# Patient Record
Sex: Male | Born: 1940 | Race: White | Hispanic: No | Marital: Single | Smoking: Former smoker
Health system: Southern US, Community
[De-identification: ages and names within clinical notes are randomized; demographics above are authoritative.]

## PROBLEM LIST (undated history)

## (undated) ENCOUNTER — Emergency Department (HOSPITAL_COMMUNITY): Admission: EM | Payer: Medicare Other | Source: Home / Self Care

## (undated) DIAGNOSIS — G20A1 Parkinson's disease without dyskinesia, without mention of fluctuations: Secondary | ICD-10-CM

## (undated) DIAGNOSIS — E785 Hyperlipidemia, unspecified: Secondary | ICD-10-CM

## (undated) DIAGNOSIS — F17200 Nicotine dependence, unspecified, uncomplicated: Secondary | ICD-10-CM

## (undated) DIAGNOSIS — G20 Parkinson's disease: Secondary | ICD-10-CM

## (undated) DIAGNOSIS — J449 Chronic obstructive pulmonary disease, unspecified: Secondary | ICD-10-CM

## (undated) DIAGNOSIS — I10 Essential (primary) hypertension: Secondary | ICD-10-CM

## (undated) DIAGNOSIS — I2510 Atherosclerotic heart disease of native coronary artery without angina pectoris: Secondary | ICD-10-CM

## (undated) HISTORY — PX: NO PAST SURGERIES: SHX2092

## (undated) HISTORY — DX: Hyperlipidemia, unspecified: E78.5

## (undated) HISTORY — PX: OTHER SURGICAL HISTORY: SHX169

## (undated) HISTORY — DX: Atherosclerotic heart disease of native coronary artery without angina pectoris: I25.10

## (undated) HISTORY — DX: Nicotine dependence, unspecified, uncomplicated: F17.200

---

## 2000-06-04 ENCOUNTER — Encounter: Payer: Self-pay | Admitting: Cardiology

## 2000-06-04 ENCOUNTER — Inpatient Hospital Stay (HOSPITAL_COMMUNITY): Admission: EM | Admit: 2000-06-04 | Discharge: 2000-06-09 | Payer: Self-pay | Admitting: Emergency Medicine

## 2007-09-02 ENCOUNTER — Ambulatory Visit: Payer: Self-pay | Admitting: Family Medicine

## 2007-09-07 ENCOUNTER — Encounter: Admission: RE | Admit: 2007-09-07 | Discharge: 2007-09-07 | Payer: Self-pay | Admitting: Family Medicine

## 2007-09-08 ENCOUNTER — Ambulatory Visit: Payer: Self-pay | Admitting: Family Medicine

## 2007-09-16 LAB — HM COLONOSCOPY

## 2007-09-22 ENCOUNTER — Encounter: Admission: RE | Admit: 2007-09-22 | Discharge: 2007-09-22 | Payer: Self-pay | Admitting: Otolaryngology

## 2007-09-27 ENCOUNTER — Encounter: Admission: RE | Admit: 2007-09-27 | Discharge: 2007-11-23 | Payer: Self-pay | Admitting: Family Medicine

## 2007-09-28 ENCOUNTER — Encounter (INDEPENDENT_AMBULATORY_CARE_PROVIDER_SITE_OTHER): Payer: Self-pay | Admitting: Otolaryngology

## 2007-09-28 ENCOUNTER — Ambulatory Visit (HOSPITAL_BASED_OUTPATIENT_CLINIC_OR_DEPARTMENT_OTHER): Admission: RE | Admit: 2007-09-28 | Discharge: 2007-09-29 | Payer: Self-pay | Admitting: Otolaryngology

## 2008-04-10 ENCOUNTER — Ambulatory Visit: Payer: Self-pay | Admitting: Family Medicine

## 2008-04-14 ENCOUNTER — Ambulatory Visit: Payer: Self-pay | Admitting: Family Medicine

## 2008-04-24 ENCOUNTER — Encounter: Admission: RE | Admit: 2008-04-24 | Discharge: 2008-04-24 | Payer: Self-pay | Admitting: Family Medicine

## 2008-07-11 ENCOUNTER — Encounter: Payer: Self-pay | Admitting: Internal Medicine

## 2008-07-11 ENCOUNTER — Ambulatory Visit: Payer: Self-pay | Admitting: Family Medicine

## 2008-08-14 ENCOUNTER — Ambulatory Visit: Payer: Self-pay | Admitting: Internal Medicine

## 2008-08-14 DIAGNOSIS — R001 Bradycardia, unspecified: Secondary | ICD-10-CM

## 2008-08-14 DIAGNOSIS — E1169 Type 2 diabetes mellitus with other specified complication: Secondary | ICD-10-CM

## 2008-08-14 DIAGNOSIS — I2510 Atherosclerotic heart disease of native coronary artery without angina pectoris: Secondary | ICD-10-CM

## 2008-08-14 DIAGNOSIS — F17200 Nicotine dependence, unspecified, uncomplicated: Secondary | ICD-10-CM | POA: Insufficient documentation

## 2008-08-14 DIAGNOSIS — E785 Hyperlipidemia, unspecified: Secondary | ICD-10-CM

## 2008-09-18 ENCOUNTER — Ambulatory Visit: Payer: Self-pay | Admitting: Internal Medicine

## 2008-10-11 ENCOUNTER — Ambulatory Visit: Payer: Self-pay | Admitting: Family Medicine

## 2008-10-19 ENCOUNTER — Encounter: Payer: Self-pay | Admitting: Internal Medicine

## 2008-10-25 ENCOUNTER — Telehealth (INDEPENDENT_AMBULATORY_CARE_PROVIDER_SITE_OTHER): Payer: Self-pay

## 2008-10-26 ENCOUNTER — Encounter: Payer: Self-pay | Admitting: Internal Medicine

## 2008-10-26 ENCOUNTER — Ambulatory Visit: Payer: Self-pay

## 2008-12-13 ENCOUNTER — Ambulatory Visit: Payer: Self-pay | Admitting: Family Medicine

## 2009-04-18 ENCOUNTER — Ambulatory Visit: Payer: Self-pay | Admitting: Family Medicine

## 2009-11-30 ENCOUNTER — Ambulatory Visit: Payer: Self-pay | Admitting: Internal Medicine

## 2009-11-30 LAB — CONVERTED CEMR LAB
AST: 14 units/L (ref 0–37)
Cholesterol: 160 mg/dL (ref 0–200)
HDL: 34.2 mg/dL — ABNORMAL LOW (ref >39.00)
LDL Cholesterol: 97 mg/dL (ref 0–99)
Total CHOL/HDL Ratio: 5
Triglycerides: 145 mg/dL (ref 0.0–149.0)
VLDL: 29 mg/dL (ref 0.0–40.0)

## 2009-12-02 ENCOUNTER — Ambulatory Visit: Payer: Self-pay | Admitting: Internal Medicine

## 2009-12-02 ENCOUNTER — Inpatient Hospital Stay (HOSPITAL_COMMUNITY): Admission: EM | Admit: 2009-12-02 | Discharge: 2009-12-04 | Payer: Self-pay | Admitting: Emergency Medicine

## 2009-12-27 ENCOUNTER — Ambulatory Visit: Payer: Self-pay | Admitting: Internal Medicine

## 2010-02-18 ENCOUNTER — Ambulatory Visit: Payer: Self-pay | Admitting: Internal Medicine

## 2010-02-21 LAB — CONVERTED CEMR LAB
AST: 17 units/L (ref 0–37)
Calcium: 9.1 mg/dL (ref 8.4–10.5)
Creatinine, Ser: 1.4 mg/dL (ref 0.4–1.5)
GFR calc non Af Amer: 54.89 mL/min — ABNORMAL LOW (ref >60.00)
HDL: 29.8 mg/dL — ABNORMAL LOW (ref >39.00)
LDL Cholesterol: 74 mg/dL (ref 0–99)
Sodium: 141 meq/L (ref 135–145)
Total CHOL/HDL Ratio: 5
Triglycerides: 162 mg/dL — ABNORMAL HIGH (ref 0.0–149.0)
VLDL: 32.4 mg/dL (ref 0.0–40.0)

## 2010-03-31 LAB — CONVERTED CEMR LAB
AST: 14 units/L (ref 0–37)
BUN: 16 mg/dL (ref 6–23)
Basophils Absolute: 0 10*3/uL (ref 0.0–0.1)
Basophils Relative: 0.2 % (ref 0.0–3.0)
CO2: 32 meq/L (ref 19–32)
Calcium: 9 mg/dL (ref 8.4–10.5)
Chloride: 103 meq/L (ref 96–112)
Cholesterol: 155 mg/dL (ref 0–200)
Creatinine, Ser: 1.5 mg/dL (ref 0.4–1.5)
Direct LDL: 99.1 mg/dL
Eosinophils Absolute: 0.3 10*3/uL (ref 0.0–0.7)
Eosinophils Relative: 4.3 % (ref 0.0–5.0)
GFR calc non Af Amer: 49.65 mL/min (ref >60)
Glucose, Bld: 114 mg/dL — ABNORMAL HIGH (ref 70–99)
HCT: 48.1 % (ref 39.0–52.0)
HDL: 29.3 mg/dL — ABNORMAL LOW (ref >39.00)
Hemoglobin: 17.2 g/dL — ABNORMAL HIGH (ref 13.0–17.0)
Lymphocytes Relative: 17.8 % (ref 12.0–46.0)
Lymphs Abs: 1.3 10*3/uL (ref 0.7–4.0)
MCHC: 35.7 g/dL (ref 30.0–36.0)
MCV: 89.3 fL (ref 78.0–100.0)
Monocytes Absolute: 0.7 10*3/uL (ref 0.1–1.0)
Monocytes Relative: 9.4 % (ref 3.0–12.0)
Neutro Abs: 4.9 10*3/uL (ref 1.4–7.7)
Neutrophils Relative %: 68.3 % (ref 43.0–77.0)
Platelets: 150 10*3/uL (ref 150.0–400.0)
Potassium: 4.1 meq/L (ref 3.5–5.1)
RBC: 5.39 M/uL (ref 4.22–5.81)
RDW: 13.7 % (ref 11.5–14.6)
Sodium: 141 meq/L (ref 135–145)
TSH: 0.86 microintl units/mL (ref 0.35–5.50)
Total CHOL/HDL Ratio: 5
Triglycerides: 209 mg/dL — ABNORMAL HIGH (ref 0.0–149.0)
VLDL: 41.8 mg/dL — ABNORMAL HIGH (ref 0.0–40.0)
WBC: 7.2 10*3/uL (ref 4.5–10.5)

## 2010-04-02 NOTE — Assessment & Plan Note (Signed)
Summary: eph/ gd   Visit Type:  EPH Primary Provider:  lalonde  CC:  denies any cardiac complaints today.  History of Present Illness: Donald Richardson is a 70 year old with a history of CAD.  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  He presented with [place] earlier this fall.  He r/o for MI   He underwent cardiac cath that showe:  60% prox LAD; 65% mid LAD lesion); 99% OM2; 90% instent restenosis of the RCA.  80% distal RCA; 60% PLSA.  He underwent PTCA/DES to the OM and PTCA/DES x 2 to the RCA. Since D/C he has  done well.  He denies chest pain.  Breathing is good.  He is still  is smoking about 10 cigs per day.  Current Medications (verified): 1)  Crestor 10 Mg Tabs (Rosuvastatin Calcium) .Marland Kitchen.. 1 Tab At Bedtime 2)  Maxzide-25 37.5-25 Mg Tabs (Triamterene-Hctz) .Marland Kitchen.. 1 Tab Once Daily 3)  Aspirin Ec 325 Mg Tbec (Aspirin) .... Take One Tablet By Mouth Daily 4)  Nitroglycerin 0.4 Mg Subl (Nitroglycerin) .... One Tablet Under Tongue Every 5 Minutes As Needed For Chest Pain---May Repeat Times Three 5)  Effient 10 Mg Tabs (Prasugrel Hcl) .Marland Kitchen.. 1 Tab Once Daily 6)  Lisinopril-Hydrochlorothiazide 10-12.5 Mg Tabs (Lisinopril-Hydrochlorothiazide) .Marland Kitchen.. 1 Tab Once Daily  Allergies (verified): No Known Drug Allergies  Past History:  Past medical, surgical, family and social histories (including risk factors) reviewed, and no changes noted (except as noted below).  Past Medical History: Reviewed history from 12/26/2009 and no changes required. coronary artery disease Dyslipidemia Tobacco use Benign tumor of parotid, s/p removal Borderline diabetes Ejection fraction of 55% by catheterization, December 03, 2009  Past Surgical History: Reviewed history from 12/26/2009 and no changes required. s/p parotid surgery percutaneous transluminal coronary angioplasty and stenting        x2 to the proximal right coronary artery-2002  Family History: Reviewed history from 08/14/2008 and no changes  required. no history of CAD. History of Parkinson's.  Social History: Reviewed history from 08/14/2008 and no changes required. patient is retired. Never married. Has one brother, who lives in [place]. He continues to smoke about a pack per day for many years. Drinks rare alcohol.  Vital Signs:  Patient profile:   70 year old male Height:      68 inches Weight:      179.8 pounds BMI:     27.44 Pulse rate:   55 / minute Pulse rhythm:   irregular BP sitting:   102 / 68  (left arm) Cuff size:   large  Vitals Entered By: Danielle Rankin, CMA (December 27, 2009 3:22 PM)  Physical Exam  Additional Exam:  Patient is in NAD HEENT:  Normocephalic, atraumatic. EOMI, PERRLA.  Neck: JVP is normal. No thyromegaly. No bruits.  Lungs: clear to auscultation. No rales no wheezes.  Heart: Regular rate and rhythm. Normal S1, S2. No S3.   No significant murmurs. PMI not displaced.  Abdomen:  Supple, nontender. Normal bowel sounds. No masses. No hepatomegaly.  Extremities:   Good distal pulses throughout. No lower extremity edema.  Musculoskeletal :moving all extremities.  Neuro:   alert and oriented x3.    EKG  Procedure date:  12/27/2009  Findings:      Sinus bradycardia.  55 bpm.  IWMI.  Possible anterolateral MI.  Impression & Recommendations:  Problem # 1:  CAD, NATIVE VESSEL (ICD-414.01) He is doing well s/p intervention to the OM2 and RCA.  Should remain on Effient, probablly lifelong given instent restenosis.  Problem # 2:  HYPERLIPIDEMIA-MIXED (ICD-272.4) I have given him samples of Crestor 20 mg.  Attempt to get tighter control.  F/U lipds in about 6 to 8 wks.  Problem # 3:  TOBACCO ABUSE (ICD-305.1) Counselled on quitting.  Patient Instructions: 1)  Your physician recommends that you return for a FASTING lipid profile: lipid, bmet, and ast on 02/18/2010  2)  Your physician wants you to follow-up in: 6 months  You will receive a reminder letter in the mail two  months in advance. If you don't receive a letter, please call our office to schedule the follow-up appointment. Prescriptions: NITROGLYCERIN 0.4 MG SUBL (NITROGLYCERIN) One tablet under tongue every 5 minutes as needed for chest pain---may repeat times three  #25 x 11   Entered by:   Layne Benton, RN, BSN   Authorized by:   Sherrill Raring, MD, [facility]   Signed by:   Layne Benton, RN, BSN on 12/27/2009   Method used:   Electronically to        [facility] (retail)       [address]       [place], [place]  [address]       Ph: [phone]       Fax: [phone]   RxID:   [id number]

## 2010-05-14 ENCOUNTER — Telehealth: Payer: Self-pay | Admitting: *Deleted

## 2010-05-15 LAB — CBC
HCT: 42.5 % (ref 39.0–52.0)
Hemoglobin: 14.9 g/dL (ref 13.0–17.0)
Hemoglobin: 14.9 g/dL (ref 13.0–17.0)
MCV: 88 fL (ref 78.0–100.0)
MCV: 89.4 fL (ref 78.0–100.0)
Platelets: 128 10*3/uL — ABNORMAL LOW (ref 150–400)
Platelets: 145 10*3/uL — ABNORMAL LOW (ref 150–400)
RBC: 4.83 MIL/uL (ref 4.22–5.81)
RBC: 4.89 MIL/uL (ref 4.22–5.81)
RBC: 5.46 MIL/uL (ref 4.22–5.81)
RDW: 13.8 % (ref 11.5–15.5)
WBC: 6.3 10*3/uL (ref 4.0–10.5)
WBC: 6.9 10*3/uL (ref 4.0–10.5)
WBC: 8 10*3/uL (ref 4.0–10.5)

## 2010-05-15 LAB — APTT: aPTT: 50 seconds — ABNORMAL HIGH (ref 24–37)

## 2010-05-15 LAB — COMPREHENSIVE METABOLIC PANEL
Albumin: 2.9 g/dL — ABNORMAL LOW (ref 3.5–5.2)
Alkaline Phosphatase: 61 U/L (ref 39–117)
BUN: 13 mg/dL (ref 6–23)
BUN: 14 mg/dL (ref 6–23)
CO2: 26 mEq/L (ref 19–32)
Chloride: 104 mEq/L (ref 96–112)
Chloride: 108 mEq/L (ref 96–112)
Creatinine, Ser: 1.41 mg/dL (ref 0.4–1.5)
GFR calc non Af Amer: 50 mL/min — ABNORMAL LOW (ref >60)
Glucose, Bld: 173 mg/dL — ABNORMAL HIGH (ref 70–99)
Potassium: 3.4 mEq/L — ABNORMAL LOW (ref 3.5–5.1)
Total Bilirubin: 0.4 mg/dL (ref 0.3–1.2)
Total Bilirubin: 0.8 mg/dL (ref 0.3–1.2)

## 2010-05-15 LAB — CARDIAC PANEL(CRET KIN+CKTOT+MB+TROPI)
CK, MB: 0.9 ng/mL (ref 0.3–4.0)
CK, MB: 1.7 ng/mL (ref 0.3–4.0)
Relative Index: INVALID (ref 0.0–2.5)
Relative Index: INVALID (ref 0.0–2.5)
Total CK: 64 U/L (ref 7–232)
Troponin I: 0.03 ng/mL (ref 0.00–0.06)

## 2010-05-15 LAB — POCT CARDIAC MARKERS
CKMB, poc: <1 ng/mL — ABNORMAL LOW (ref 1.0–8.0)
Myoglobin, poc: 77.6 ng/mL (ref 12–200)

## 2010-05-15 LAB — BASIC METABOLIC PANEL
Calcium: 8.2 mg/dL — ABNORMAL LOW (ref 8.4–10.5)
GFR calc Af Amer: >60 mL/min (ref >60)
GFR calc non Af Amer: 52 mL/min — ABNORMAL LOW (ref >60)
Sodium: 138 mEq/L (ref 135–145)

## 2010-05-15 LAB — PROTIME-INR: Prothrombin Time: 13.8 seconds (ref 11.6–15.2)

## 2010-05-15 LAB — PLATELET INHIBITION P2Y12: Platelet Function Baseline: 225 [PRU] (ref 194–418)

## 2010-05-15 LAB — HEMOGLOBIN A1C: Mean Plasma Glucose: 137 mg/dL — ABNORMAL HIGH (ref <117)

## 2010-05-21 NOTE — Progress Notes (Signed)
----   Converted from flag ---- ---- 05/14/2010 12:26 PM, Sherrill Raring, MD, [facility] wrote: Annice Pih Please send RX to [facility] at [facility] for Crestor 40 mg per day.   Twelve refills. ------------------------------       New/Updated Medications: CRESTOR 40 MG TABS (ROSUVASTATIN CALCIUM) 1 tab every day Prescriptions: CRESTOR 40 MG TABS (ROSUVASTATIN CALCIUM) 1 tab every day  #30 x 11   Entered by:   Layne Benton, RN, BSN   Authorized by:   Sherrill Raring, MD, [facility]   Signed by:   Layne Benton, RN, BSN on 05/14/2010   Method used:   Electronically to        [facility]. [address] [phone]* (retail)       [address]       [place], [place]  [address]       Ph: [phone] or [phone]       Fax: [phone]   RxID:   [id number]

## 2010-06-18 ENCOUNTER — Ambulatory Visit (INDEPENDENT_AMBULATORY_CARE_PROVIDER_SITE_OTHER): Payer: Medicare Other | Admitting: Family Medicine

## 2010-06-18 DIAGNOSIS — I10 Essential (primary) hypertension: Secondary | ICD-10-CM

## 2010-06-18 DIAGNOSIS — E119 Type 2 diabetes mellitus without complications: Secondary | ICD-10-CM

## 2010-06-18 DIAGNOSIS — E785 Hyperlipidemia, unspecified: Secondary | ICD-10-CM

## 2010-06-18 DIAGNOSIS — I2510 Atherosclerotic heart disease of native coronary artery without angina pectoris: Secondary | ICD-10-CM

## 2010-07-16 NOTE — Op Note (Signed)
NAME:  Donald Richardson, REAGLE                  ACCOUNT NO.:  [id number]   MEDICAL RECORD NO.:  [id number]          PATIENT TYPE:  AMB   LOCATION:  DSC                          FACILITY:  [facility]   PHYSICIAN:  Christopher E. Ezzard Standing, M.D.DATE OF BIRTH:  [birth year]   DATE OF PROCEDURE:  09/28/2007  DATE OF DISCHARGE:                               OPERATIVE REPORT   PREOPERATIVE DIAGNOSIS:  Right neck right parotid mass.   POSTOPERATIVE DIAGNOSIS:  Right neck right parotid mass.   OPERATION:  Right superficial parotidectomy with facial nerve  dissection, removal of neck and parotid mass.   SURGEON:  Kristine Garbe. Ezzard Standing, MD   ANESTHESIA:  General endotracheal.   COMPLICATIONS:  None.   BRIEF CLINICAL NOTE:  Donald Richardson is a 70 year old gentleman who has had  a right neck mass as noted a little bit over a month or two.  He had  ultrasound which showed this to be a cystic-type lesion measuring  approximately 3-4 cm in size located just below the angle of the  mandible on the right side.  He had a follow-up CT scan which showed a  large cystic mass arising from the inferior aspect of the right parotid  gland and a second smaller mass more toward the body of the parotid  gland.  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  He was taken to operating room at this time for parotidectomy,  facial nerve dissection and removal of parotid and neck mass.   DESCRIPTION OF PROCEDURE:  The patient was brought to the operating  room.  He received 1 g Ancef IV preoperatively.  Neck was prepped out  with Betadine solution and draped out with sterile towels.  The proposed  parotid incision was marked out.  This was then incised and anterior  flap was elevated superficial to the parotid fascia and then a second  flap was elevated posteriorly back to the sternocleidomastoid muscle.  The patient had one mass just below the earlobe and a second larger mass  just below the angle of the  mandible on the right side.  Because of  proximity to the marginal nerve, the dissection was then carried along  the ear canal down to the trunk of the facial nerve.  The trunk of the  facial nerve was then identified.  Hemostasis was obtained with bipolar  cautery.  Dissection was carried out until the nerve divided and then  the inferior branch of the facial nerve was further dissected out.  The  parotid masses were dissected off of the inferior aspect of the inferior  branch of the facial nerve.  Hemostasis was obtained with the bipolar  cautery and 4-0 silk sutures.  The mass was sent to pathology.  Hemostasis was obtained with bipolar cautery and sutures.  The wound was  irrigated with saline.  A #10-French parotid drain was brought out  through a separate stab incision behind the ear and hooked to [employer] suction.  The incision was closed with 3-0 chromic sutures  subcutaneously and 5-0 nylon reapproximate skin  edges.  He was awoke  from anesthesia and transferred to recovery and postop doing well.   DISPOSITION:  He will be observed overnight.  We will plan on removing  the JP drain in the morning.  Discharging him home on Tylenol and  Vicodin p.r.n. pain.  Follow-up office in 6-7 days for a recheck, to  review pathology, and have the sutures removed.           ______________________________  Kristine Garbe Ezzard Standing, M.D.     CEN/MEDQ  D:  09/28/2007  T:  09/29/2007  Job:  [id number]   cc:   Sharlot Gowda, M.D.

## 2010-07-19 NOTE — Discharge Summary (Signed)
[facility]  Patient:    Donald Richardson, Donald Richardson                         MRN: [id number] Adm. Date:  [id number] Disc. Date: 06/09/00 Attending:  Learta Codding Dictator:   Chinita Pester, N.P.                           Discharge Summary  DISCHARGE DIAGNOSIS:  Status post acute inferolateral wall myocardial infarction.  HISTORY OF PRESENT ILLNESS:  This is a 70 year old male with a history of hypertension and tobacco abuse.  The patient was brought into the emergency room by Dr. Dietrich Pates, who is Mr. Shad neighbor.  The patient developed approximately one hour and 15 minutes worth of acute-onset of substernal chest pain with radiation to both arms and elbows.  The pain was rated at grade 8/10.  This was associated with mild shortness of breath, diaphoresis and rigors.  On telemetry by EMS, the patient appeared to have significant elevation in leads II and III and was brought to the emergency room.  A 12-lead EKG confirmed inferolateral MI.  The patient remained hemodynamically stable in the emergency room.  He was started on aspirin, nitroglycerin and heparin with abciximab.  Arrangements were made for emergency transfer for catheterization.  HOSPITAL COURSE:  The patient underwent cardiac catheterization and stents to his RCA.  The patient had a three-vessel CAD with moderate LV dysfunction, acute inferior MI, EF approximated at 35%, left main osteal 20%, LAD 30% proximal and mid, left circumflex 30%, 50% OM-3, RCA was dominant at 100% proximal.  The patient underwent PCI of his RCA with stent.  He was to continue on Plavix for one month.  During hospitalization, the patient was noted to have a low potassium level which was repleted and also hypertension. He was placed on Lopressor 100 mg b.i.d. with increased dose of Altace and to continue on his Norvasc.  The patient was discharged to home in stable condition.  DISCHARGE MEDICATIONS: 1. Lopressor 100 mg  twice a day. 2. Coated aspirin 325 mg daily. 3. Plavix 75 mg daily for one month. 4. Pravachol 40 mg daily. 5. Altace 10 daily. 6. Norvasc 5 mg daily.  ACTIVITY:  No heavy lifting or strenuous activity x 3 days.  No driving for two days.  DIET:  Low fat, low cholesterol, no added salt diet.  WOUND CARE:  He was to keep his puncture site clean and dry and to call if any drainage.  He did have ecchymosis at the site, but no hematoma or bruit.  FOLLOWUP:  He was to have a BMP within one week and to follow up with Dr. Tenny Craw on May 1, at 11:45 a.m. DD:  06/09/00 TD:  06/09/00 Job: [id number]

## 2010-07-19 NOTE — Cardiovascular Report (Signed)
. [facility]  Patient:    Donald Richardson, Donald Richardson                         MRN: [id number] Proc. Date: 06/04/00 Adm. Date:  [id number] Attending:  Learta Codding CC:         Dietrich Pates, M.D. [facility]   Cardiac Catheterization  PROCEDURES PERFORMED: 1. Left heart catheterization. 2. Left ventriculogram. 3. Selective coronary angiography. 4. Percutaneous transluminal coronary angioplasty and stenting of the proximal    and mid right coronary artery.  DIAGNOSES: 1. Three-vessel coronary artery disease. 2. Moderate left ventricular systolic dysfunction. 3. Acute inferior wall myocardial infarction.  INDICATIONS:  [name] is a 70 year old, white male with multiple cardiovascular risk factors, who presents with the acute onset of substernal chest discomfort starting approximately 6 p.m.  The patient was brought by EMS to the emergency room where he was found to have ST elevation in the inferior leads consistent with acute inferior wall myocardial infarction.  He was given heparin and ReoPro, as well as aspirin orally and transferred to the catheterization lab urgently for left heart catheterization due to persistence of pain and ECG changes.  TECHNIQUE:  After informed consent was obtained, the patient was brought to the cardiac catheterization lab, and a 7 French sheath was placed in the right femoral artery.  Selective angiography and left heart catheterization we were then performed in the usual fashion using a 6 French preformed Judkins catheter.  A 7 [place], JR4 guide catheter was used to engage the right coronary artery.  FINDINGS:  Initial findings are as follows: 1. Left main trunk:  The left main trunk is a large caliber vessel, ostial    narrowing of 20%. 2. LAD:  This is a medium caliber vessel that provides a small first and    third diagonal branch and medium caliber second diagonal in the mid    section.  Several septal perforators were seen to  arise from the proximal    segment.  There is mild diffuse disease in the proximal mid section of the    left anterior descending with narrowings of not greater than 30%. 3. Left circumflex artery:  This is a large caliber vessel that provides a    small first marginal branch in the proximal segment, a large bifurcating    second marginal branch in the mid section, and a medium caliber third    marginal branch distally.  There is a tubular stenosis of 50% in the    proximal segment of the third marginal branch.  The remainder of the    left circumflex system has moderate diffuse disease with narrowings not    greater than 30%. 4. Right coronary artery:  The right coronary artery is dominant.  This is a    large caliber vessel, seen to be provide a posterior descending artery as    well as posterior ventricular branch in its terminal segment.  The    right coronary artery is occluded proximally.  It has severe diffuse    disease in the proximal and mid section extending up to a RV marginal    branch.  LEFT VENTRICULOGRAM:  Mildly dilated end-systolic and end-diastolic dimensions.  Overall left ventricular function is moderately impaired. Ejection fraction approximately 35%.  There is hypokinesis of the apex with akinesis of the inferior wall.  No mitral regurgitation is noted.  LV pressure is 137/5, aortic is 137/90.  LVEDP equals 12.  With these findings, we elected to proceed with percutaneous intervention of the right coronary artery.  A 0.014 inch Hi-Torque Floppy wire was introduced and carefully advanced to the site of occlusion.  This was successfully negotiated through the occlusion of the distal RCA.  Repeat angiography showed very faint filling of the distal vessel confirming position within the true lumen.  However, there was severe diffuse disease in the proximal and mid section of the right coronary artery.  A 3.0 x 20 mm Maverick balloon was introduced and used to dilate  the area of stenosis at 6 atmospheres for 15 seconds.  Repeat angiography now showed patency of the vessel and rapid resolution of chest discomfort.  The right coronary artery was seen to be a large caliber vessel with approximately 5 mm proximally and 4.5 mm distally with a posterior descending artery and posterior ventricular branch.  A 5.0 x 20 mm Titan balloon was introduced.  A total of four inflations were performed in the proximal and mid right coronary artery and up to 10 atmospheres for 30 seconds.  Angiography now showed significant improvement of vessel lumen with moderate residual disease of 50%.  There was evidence of some irregularity in the mid section of the RCA prior to the RV marginal branch suggestive of underlying disease and possible dissection.  A 5.0 x 28 mm Ultra stent was introduced and deployed in the proximal right coronary artery extending up to the RV marginal branch in the mid section at 10 atmospheres for 30 seconds.  The Titan balloon was reintroduced and used to post-dilate the stent.  Distal inflation was performed at 10 atmospheres for 30 seconds.  Further inflations in the mid and proximal segments of the stent at 12 and 12 atmospheres for 30 second each.  Repeat angiography now showed an excellent result with significant improvement in vessel lumen and full expansion of the proximal RCA.  There was evidence of residual disease in the mid RCA just distal to the RV marginal branch with type A dissection.  We elected to cover this with a 4.5 x 18 mm Ultra stent deployed at 12 atmospheres for 30 seconds.  There was a slight gap between the two stents in the area of the RV marginal branch.  Repeat angiography now showed an excellent result with no residual stenosis, full coverage of the diseased segment of artery, as well as the dissection.  There was TIMI-3 flow in the distal right coronary artery.  There was sluggish flow in the small RV marginal branch.   However, the patient was pain-free, and thus we elected not  to intervene on this vessel.  Final angiography was then performed in various projections confirming TIMI-3 flow through the distal RCA and no evidence of residual vessel damage.  The guide catheter was then removed and the sheath secured in position.  The patient tolerated the procedure well with resolution of chest discomfort at the end of the case.  He was transferred to CCU in stable condition.  FINAL RESULTS:  Successful percutaneous transluminal coronary angioplasty and stenting of the proximal and mid right coronary artery with reduction of 100% to 0% with placement of a 5.0 x 28 mm Ultra stent followed by a 4.5 x 18 mm Ultra stent in the mid section with improvement of TIMI grade 0 to TIMI grade 3 flow.  ASSESSMENT AND PLAN:  Mr. Klinger is a 70 year old gentleman, status post inferior wall myocardial infarction.  He will be observed  in the intensive care unit.  He will continue on ReoPro overnight and will continue Plavix for one month.  Aggressive risk factor modification will be pursued. DD:  06/04/00 TD:  06/05/00 Job: [id number]

## 2010-07-19 NOTE — H&P (Signed)
[place]  Patient:    WAINO, MOUNSEY                         MRN: [id number] Adm. Date:  [id number] Attending:  Learta Codding CC:         Dietrich Pates, M.D. [facility]                         History and Physical  CARDIOLOGIST:  Dr. Dietrich Pates.  REASON FOR ADMISSION:  Acute inferolateral myocardial infarction.  HISTORY OF PRESENT ILLNESS:  Mr. Krol is a 70 year old white male with a history of hypertension and tobacco abuse.  The patient is brought to the emergency room by Dr. Dietrich Pates, who is Mr. Prasad neighbor.  The patient developed approximately an hour and 15 minutes ago acute onset of substernal chest pain with radiation to both arms and both elbows.  The pain was rated as a grade 8/10.  This was associated with mild shortness of breath and diaphoresis and rigors.  On telemetry by EMS, the patient appeared to have significant elevation in lead II and III and was brought emergently to the emergency room.  Twelve-lead electrocardiogram confirmed inferolateral myocardial infarction, albeit the patient remained hemodynamically stable.  In the emergency room, the patient was started on aspirin, nitroglycerin, heparin and abciximab.  Arrangements were made for emergent transfer to the catheterization laboratory for possible percutaneous coronary intervention.  PAST MEDICAL HISTORY: 1. Hypertension, treated with Norvasc. 2. Mild exertional chest pain. 3. Questionable history of lung nodule.  MEDICATIONS: 1. Norvasc 5 mg a day. 2. Aspirin one tablet today.  ALLERGIES:  No known drug allergies.  SOCIAL HISTORY:  The patient smokes approximately a pack a day of tobacco.  He used to smoke half a pack a day but has increased this habit.  He occasionally drinks alcohol.  FAMILY HISTORY:  Family history is essentially noncontributory and it is negative for significant coronary artery disease.  REVIEW OF SYSTEMS:  As per HPI.  No nausea or vomiting.   No abdominal pain. No headache.  No visual problems.  Otherwise, within normal limits.  PHYSICAL EXAMINATION:  VITAL SIGNS:  Blood pressure 156/112 mmHg.  Heart rate is 75 beats per minute. Temperature is afebrile.  GENERAL:  A well-nourished white male, somewhat pale, but in no obvious distress.  HEENT:  Pupils anisocoric.  Conjunctivae clear.  NECK:  Supple.  No JVD or abdominojugular reflux.  Normal carotid upstroke. No bruits.  LUNGS:  Clear breath sounds bilaterally.  HEART:  Regular rate and rhythm.  Normal S1 and S2.  No murmur, rub or gallop.  ABDOMEN:  Abdomen is soft and nontender.  No rebound or guarding.  There is no organomegaly.  EXTREMITIES:  Peripheral pulses 2+.  There is no cyanosis, clubbing or edema.  NEUROLOGIC:  Alert, oriented.  Grossly nonfocal.  GENITOURINARY:  Exam was deferred.  SKIN:  Exam was within normal limits.  LABORATORY DATA:  WBC 10.9, hemoglobin 16.4, hematocrit 46.9, platelet count 204,000, neutrophils 83%.  Pro time is 12.4, INR is 0.9, PTT is 24. Creatinine is 1.3.  Potassium is 4.9.  Sodium is 140.  Blood sugar is 156.  Chest x-ray:  No cardiomegaly, no infiltrates, no obvious lung nodule, reviewed with the radiologist.  Twelve-lead EKG:  Normal sinus rhythm with marked ST elevation, II, III and aVF, and ST depression, I and aVL.  There is  also marked ST elevation in lead V4, V5 and V6 consistent with inferolateral wall myocardial infarction.  IMPRESSION: 1. Acute inferolateral wall myocardial infarction/hemodynamically stable.    Risks and benefits of the various interventions for acute myocardial    infarction were explained to the patient, i.e., thrombolytic therapy versus    a primary angioplasty.  The patient has consented to proceed with primary    angioplasty and emergent cardiac catheterization.  He has been started on    aspirin, nitroglycerin, heparin and ReoPro in the emergency room and will    be transferred  emergently to the catheterization lab.  In addition, beta    blocker was given in the emergency room with three doses of metoprolol 5 mg    each. 2. Hypertension, controlled now with intravenous nitroglycerin and beta    blocker therapy.  Will start also on ACE inhibitor therapy. 3. Rule out hyperlipidemia.  Lipid panel is pending for a.m. DD:  06/04/00 TD:  06/05/00 Job: [id number]

## 2010-10-16 ENCOUNTER — Encounter: Payer: Self-pay | Admitting: Family Medicine

## 2010-10-17 ENCOUNTER — Ambulatory Visit (INDEPENDENT_AMBULATORY_CARE_PROVIDER_SITE_OTHER): Payer: Medicare Other | Admitting: Family Medicine

## 2010-10-17 ENCOUNTER — Encounter: Payer: Self-pay | Admitting: Family Medicine

## 2010-10-17 DIAGNOSIS — E119 Type 2 diabetes mellitus without complications: Secondary | ICD-10-CM | POA: Insufficient documentation

## 2010-10-17 DIAGNOSIS — E1169 Type 2 diabetes mellitus with other specified complication: Secondary | ICD-10-CM

## 2010-10-17 DIAGNOSIS — F17200 Nicotine dependence, unspecified, uncomplicated: Secondary | ICD-10-CM

## 2010-10-17 DIAGNOSIS — E785 Hyperlipidemia, unspecified: Secondary | ICD-10-CM

## 2010-10-17 DIAGNOSIS — E118 Type 2 diabetes mellitus with unspecified complications: Secondary | ICD-10-CM | POA: Insufficient documentation

## 2010-10-17 DIAGNOSIS — I10 Essential (primary) hypertension: Secondary | ICD-10-CM

## 2010-10-17 DIAGNOSIS — E1159 Type 2 diabetes mellitus with other circulatory complications: Secondary | ICD-10-CM

## 2010-10-17 LAB — POCT GLYCOSYLATED HEMOGLOBIN (HGB A1C): Hemoglobin A1C: 6.3

## 2010-10-17 NOTE — Progress Notes (Signed)
  Subjective:    Patient ID: Donald Richardson, male    DOB: 05-08-1940, 70 y.o.   MRN: [id number]  HPI He is here for recheck. He continues to smoke and is not interested in quitting. He continues on medications listed in the chart. He remains relatively active but has had no chest pain, shortness of breath. He has had eye exam within the last year. He does check his feet intermittently. He also has a slight tremor that he states has been there for 20+ years. He has no other concerns or complaints.  Review of Systems     Objective:   Physical Exam Alert and in no distress otherwise not examined. Hemoglobin A1c 6.3.       Assessment & Plan:  Diabetes. Hypertension. Hyperlipidemia. Cigarette abuse. ASHD. I again encouraged him to quit smoking however he is not interested. Continue on present medication regimen. Encouraged him to return here for flu shot this fall.

## 2010-10-17 NOTE — Patient Instructions (Signed)
Come back next month and get a flu shot. Call first

## 2010-10-18 ENCOUNTER — Ambulatory Visit: Payer: No Typology Code available for payment source | Admitting: Family Medicine

## 2010-11-29 LAB — BASIC METABOLIC PANEL
BUN: 12
Calcium: 9.2
GFR calc non Af Amer: 52 — ABNORMAL LOW
Potassium: 4.2
Sodium: 141

## 2010-11-29 LAB — POCT HEMOGLOBIN-HEMACUE: Hemoglobin: 19.1 — ABNORMAL HIGH

## 2011-02-17 ENCOUNTER — Ambulatory Visit (INDEPENDENT_AMBULATORY_CARE_PROVIDER_SITE_OTHER): Payer: Medicare Other | Admitting: Family Medicine

## 2011-02-17 ENCOUNTER — Encounter: Payer: Self-pay | Admitting: Family Medicine

## 2011-02-17 DIAGNOSIS — F17200 Nicotine dependence, unspecified, uncomplicated: Secondary | ICD-10-CM

## 2011-02-17 DIAGNOSIS — E785 Hyperlipidemia, unspecified: Secondary | ICD-10-CM

## 2011-02-17 DIAGNOSIS — I2510 Atherosclerotic heart disease of native coronary artery without angina pectoris: Secondary | ICD-10-CM

## 2011-02-17 DIAGNOSIS — E119 Type 2 diabetes mellitus without complications: Secondary | ICD-10-CM

## 2011-02-17 DIAGNOSIS — Z23 Encounter for immunization: Secondary | ICD-10-CM

## 2011-02-17 NOTE — Progress Notes (Signed)
  Subjective:    Patient ID: Donald Richardson, male    DOB: 06/07/1940, 70 y.o.   MRN: [id number]  HPI He is here for a followup visit. He remains active and he does continue to smoke. He is not having chest pain or shortness of breath. He is not checking his blood sugars regularly. Does check his feet periodically. He continues on medications listed in the chart. He saw his cardiologist 2 months ago. He has been seen by ophthalmology in the past year. He has no other concerns or complaints.   Review of Systems     Objective:   Physical Exam Alert and in no distress. Hemoglobin A1c is 6.4       Assessment & Plan:   1. Diabetes mellitus  POCT HgB A1C  2. HYPERLIPIDEMIA-MIXED    3. TOBACCO ABUSE    4. CAD, NATIVE VESSEL     He is not at all interested in quitting smoking or making any other major lifestyle changes. Continue to monitor.

## 2011-05-09 DIAGNOSIS — H4011X0 Primary open-angle glaucoma, stage unspecified: Secondary | ICD-10-CM | POA: Diagnosis not present

## 2011-05-09 DIAGNOSIS — E1139 Type 2 diabetes mellitus with other diabetic ophthalmic complication: Secondary | ICD-10-CM | POA: Diagnosis not present

## 2011-06-18 ENCOUNTER — Telehealth: Payer: Self-pay | Admitting: Family Medicine

## 2011-06-18 ENCOUNTER — Encounter: Payer: Self-pay | Admitting: Family Medicine

## 2011-06-18 ENCOUNTER — Ambulatory Visit (INDEPENDENT_AMBULATORY_CARE_PROVIDER_SITE_OTHER): Payer: Medicare Other | Admitting: Family Medicine

## 2011-06-18 DIAGNOSIS — E785 Hyperlipidemia, unspecified: Secondary | ICD-10-CM

## 2011-06-18 DIAGNOSIS — I2510 Atherosclerotic heart disease of native coronary artery without angina pectoris: Secondary | ICD-10-CM

## 2011-06-18 DIAGNOSIS — F17200 Nicotine dependence, unspecified, uncomplicated: Secondary | ICD-10-CM | POA: Diagnosis not present

## 2011-06-18 DIAGNOSIS — E119 Type 2 diabetes mellitus without complications: Secondary | ICD-10-CM

## 2011-06-18 LAB — POCT GLYCOSYLATED HEMOGLOBIN (HGB A1C): Hemoglobin A1C: 6.6

## 2011-06-18 LAB — POCT UA - MICROALBUMIN: Microalbumin Ur, POC: 11.9 mg/dL

## 2011-06-18 MED ORDER — LISINOPRIL-HYDROCHLOROTHIAZIDE 10-12.5 MG PO TABS: 1.0000 | ORAL_TABLET | Freq: Every day | ORAL | Status: DC

## 2011-06-18 NOTE — Telephone Encounter (Signed)
Med sent in.

## 2011-06-18 NOTE — Progress Notes (Signed)
  Subjective:    Patient ID: Donald Richardson, male    DOB: Jul 25, 1940, 71 y.o.   MRN: [id number]  HPI He is here for recheck on his diabetes. He continues on medications listed in the chart. He has no chest pain, shortness of breath, DOE. He continues to smoke and again is not interested in quitting. He has had an eye exam in the last year and he did have a foot exam done on his last visit.   Review of Systems     Objective:   Physical Exam Alert and in no distress smelling of cigarettes. Lungs are clear to auscultation. Cardiac exam shows distant heart sounds with a normal rhythm. Hemoglobin A1c is 6.6.       Assessment & Plan:   1. Diabetes mellitus  POCT HgB A1C, POCT UA - Microalbumin  2. HYPERLIPIDEMIA-MIXED    3. TOBACCO ABUSE    4. CAD, NATIVE VESSEL     he is again unwilling to make any major last all changes. We will therefore continue him on his present regimen.

## 2011-08-08 DIAGNOSIS — H4011X0 Primary open-angle glaucoma, stage unspecified: Secondary | ICD-10-CM | POA: Diagnosis not present

## 2011-10-22 ENCOUNTER — Ambulatory Visit (INDEPENDENT_AMBULATORY_CARE_PROVIDER_SITE_OTHER): Payer: Medicare Other | Admitting: Family Medicine

## 2011-10-22 ENCOUNTER — Encounter: Payer: Self-pay | Admitting: Family Medicine

## 2011-10-22 ENCOUNTER — Other Ambulatory Visit: Payer: Self-pay

## 2011-10-22 VITALS — BP 120/70 | HR 60 | Wt 176.0 lb

## 2011-10-22 DIAGNOSIS — E785 Hyperlipidemia, unspecified: Secondary | ICD-10-CM

## 2011-10-22 DIAGNOSIS — F17200 Nicotine dependence, unspecified, uncomplicated: Secondary | ICD-10-CM | POA: Diagnosis not present

## 2011-10-22 DIAGNOSIS — H409 Unspecified glaucoma: Secondary | ICD-10-CM

## 2011-10-22 DIAGNOSIS — Z79899 Other long term (current) drug therapy: Secondary | ICD-10-CM

## 2011-10-22 DIAGNOSIS — I2510 Atherosclerotic heart disease of native coronary artery without angina pectoris: Secondary | ICD-10-CM | POA: Diagnosis not present

## 2011-10-22 DIAGNOSIS — E119 Type 2 diabetes mellitus without complications: Secondary | ICD-10-CM | POA: Diagnosis not present

## 2011-10-22 LAB — CBC WITH DIFFERENTIAL/PLATELET
Eosinophils Relative: 3 % (ref 0–5)
HCT: 46.2 % (ref 39.0–52.0)
Hemoglobin: 16 g/dL (ref 13.0–17.0)
Lymphocytes Relative: 20 % (ref 12–46)
MCHC: 34.6 g/dL (ref 30.0–36.0)
MCV: 88.2 fL (ref 78.0–100.0)
Monocytes Absolute: 0.6 10*3/uL (ref 0.1–1.0)
Monocytes Relative: 10 % (ref 3–12)
Neutro Abs: 4.4 10*3/uL (ref 1.7–7.7)
WBC: 6.6 10*3/uL (ref 4.0–10.5)

## 2011-10-22 LAB — POCT GLYCOSYLATED HEMOGLOBIN (HGB A1C): Hemoglobin A1C: 6.7

## 2011-10-22 MED ORDER — LISINOPRIL-HYDROCHLOROTHIAZIDE 10-12.5 MG PO TABS: 1.0000 | ORAL_TABLET | Freq: Every day | ORAL | Status: DC

## 2011-10-22 NOTE — Telephone Encounter (Signed)
Sent B/P meds in

## 2011-10-22 NOTE — Progress Notes (Signed)
  Subjective:    Patient ID: Donald Richardson, male    DOB: [birth year], 71 y.o.   MRN: [id number]  HPI He is here for recheck. He continues on medications listed in the chart. He does remain active but does not exercise. He does not check his blood sugars with any regularity. He does continue to smoke and has no desire to quit. He's had no chest pain, shortness of breath, weakness. He does have glaucoma and is using drops were this. He does see his eye doctor regularly for this. He does occasionally check his feet.   Review of Systems     Objective:   Physical Exam Alert and in no distress. Globin A1c is 6.7.       Assessment & Plan:   1. Diabetes mellitus  POCT glycosylated hemoglobin (Hb A1C), CBC with Differential, Comprehensive metabolic panel, POCT UA - Microalbumin  2. Glaucoma    3. TOBACCO ABUSE    4. HYPERLIPIDEMIA-MIXED  Lipid panel  5. CAD, NATIVE VESSEL    6. Encounter for long-term (current) use of other medications  CBC with Differential, Comprehensive metabolic panel

## 2011-10-23 LAB — COMPREHENSIVE METABOLIC PANEL
Albumin: 4 g/dL (ref 3.5–5.2)
BUN: 16 mg/dL (ref 6–23)
Calcium: 9 mg/dL (ref 8.4–10.5)
Chloride: 106 mEq/L (ref 96–112)
Creat: 1.4 mg/dL — ABNORMAL HIGH (ref 0.50–1.35)
Glucose, Bld: 102 mg/dL — ABNORMAL HIGH (ref 70–99)
Potassium: 3.9 mEq/L (ref 3.5–5.3)

## 2011-10-23 LAB — LIPID PANEL
Cholesterol: 119 mg/dL (ref 0–200)
HDL: 28 mg/dL — ABNORMAL LOW (ref >39)
Triglycerides: 155 mg/dL — ABNORMAL HIGH (ref <150)

## 2011-11-14 DIAGNOSIS — E1139 Type 2 diabetes mellitus with other diabetic ophthalmic complication: Secondary | ICD-10-CM | POA: Diagnosis not present

## 2011-11-14 DIAGNOSIS — H4011X0 Primary open-angle glaucoma, stage unspecified: Secondary | ICD-10-CM | POA: Diagnosis not present

## 2012-01-19 ENCOUNTER — Other Ambulatory Visit: Payer: Self-pay

## 2012-01-19 MED ORDER — ROSUVASTATIN CALCIUM 40 MG PO TABS: 40.0000 mg | ORAL_TABLET | Freq: Every day | ORAL | Status: DC

## 2012-02-13 DIAGNOSIS — H4011X0 Primary open-angle glaucoma, stage unspecified: Secondary | ICD-10-CM | POA: Diagnosis not present

## 2012-02-19 ENCOUNTER — Encounter: Payer: Self-pay | Admitting: Family Medicine

## 2012-02-19 ENCOUNTER — Ambulatory Visit (INDEPENDENT_AMBULATORY_CARE_PROVIDER_SITE_OTHER): Payer: Medicare Other | Admitting: Family Medicine

## 2012-02-19 VITALS — BP 114/70 | HR 48 | Wt 175.0 lb

## 2012-02-19 DIAGNOSIS — E119 Type 2 diabetes mellitus without complications: Secondary | ICD-10-CM | POA: Diagnosis not present

## 2012-02-19 DIAGNOSIS — F17200 Nicotine dependence, unspecified, uncomplicated: Secondary | ICD-10-CM | POA: Diagnosis not present

## 2012-02-19 DIAGNOSIS — E785 Hyperlipidemia, unspecified: Secondary | ICD-10-CM

## 2012-02-19 DIAGNOSIS — Z9119 Patient's noncompliance with other medical treatment and regimen: Secondary | ICD-10-CM

## 2012-02-19 DIAGNOSIS — I498 Other specified cardiac arrhythmias: Secondary | ICD-10-CM

## 2012-02-19 LAB — POCT UA - MICROALBUMIN
Albumin/Creatinine Ratio, Urine, POC: 4.4
Creatinine, POC: 194.2 mg/dL
Microalbumin Ur, POC: 8.5 mg/dL

## 2012-02-19 NOTE — Patient Instructions (Signed)
Cut back on your desserts!

## 2012-02-19 NOTE — Progress Notes (Signed)
  Subjective:    Patient ID: Donald Richardson, male    DOB: 03-07-1940, 71 y.o.   MRN: [id number]  HPI He is here for a recheck. He does not check his blood sugars at all. He does continue to smoke and is not interested in quitting. He continues on medications listed in the chart. He keeps active but is not involved in any regular exercise program. He has had no difficulty with chest pain, shortness of breath, PND or DOE.   Review of Systems     Objective:   Physical Exam Alert and in no distress. Foot exam is done and was normal except for some drying and scaling. Hemoglobin A1c is 6.8.      Assessment & Plan:   1. Diabetes mellitus  POCT glycosylated hemoglobin (Hb A1C), POCT UA - Microalbumin  2. HYPERLIPIDEMIA-MIXED    3. TOBACCO ABUSE    4. BRADYCARDIA    5. Personal history of noncompliance with medical treatment, presenting hazards to health     Discussed the fact that his A1c is slowly going up. He will eventually need to be placed on medication. This information was relayed to him.

## 2012-04-04 ENCOUNTER — Other Ambulatory Visit: Payer: Self-pay | Admitting: Internal Medicine

## 2012-04-06 ENCOUNTER — Telehealth: Payer: Self-pay | Admitting: *Deleted

## 2012-04-06 NOTE — Telephone Encounter (Signed)
Fax Received. Refill Completed. Donald Richardson (R.M.A)  unable to leave message for pt to call aoffice to make appointment due to refill request.  NEED APPOINTMENT.

## 2012-04-06 NOTE — Telephone Encounter (Signed)
unable to leave message for pt to call aoffice to make appointment due to refill request. Pt needs appointment then refill can be made Fax Received. Refill Completed. Bridey Brookover Chowoe (R.M.A)

## 2012-05-14 DIAGNOSIS — H4011X0 Primary open-angle glaucoma, stage unspecified: Secondary | ICD-10-CM | POA: Diagnosis not present

## 2012-06-04 ENCOUNTER — Other Ambulatory Visit: Payer: Self-pay | Admitting: Internal Medicine

## 2012-06-09 ENCOUNTER — Other Ambulatory Visit: Payer: Self-pay | Admitting: *Deleted

## 2012-06-15 ENCOUNTER — Telehealth: Payer: Self-pay | Admitting: Internal Medicine

## 2012-06-15 MED ORDER — LISINOPRIL-HYDROCHLOROTHIAZIDE 10-12.5 MG PO TABS: 1.0000 | ORAL_TABLET | Freq: Every day | ORAL | Status: DC

## 2012-06-15 NOTE — Telephone Encounter (Signed)
Refill request for lisinopril/hctz 10-12.5 #90 to [facility] [address]

## 2012-06-15 NOTE — Telephone Encounter (Signed)
SENT IN B/P MEDS

## 2012-06-16 ENCOUNTER — Ambulatory Visit (INDEPENDENT_AMBULATORY_CARE_PROVIDER_SITE_OTHER): Payer: Medicare Other | Admitting: Family Medicine

## 2012-06-16 ENCOUNTER — Encounter: Payer: Self-pay | Admitting: Family Medicine

## 2012-06-16 VITALS — BP 126/80 | HR 56 | Wt 175.0 lb

## 2012-06-16 DIAGNOSIS — I10 Essential (primary) hypertension: Secondary | ICD-10-CM

## 2012-06-16 DIAGNOSIS — E785 Hyperlipidemia, unspecified: Secondary | ICD-10-CM | POA: Diagnosis not present

## 2012-06-16 DIAGNOSIS — F17200 Nicotine dependence, unspecified, uncomplicated: Secondary | ICD-10-CM | POA: Diagnosis not present

## 2012-06-16 DIAGNOSIS — E119 Type 2 diabetes mellitus without complications: Secondary | ICD-10-CM

## 2012-06-16 DIAGNOSIS — E1169 Type 2 diabetes mellitus with other specified complication: Secondary | ICD-10-CM

## 2012-06-16 DIAGNOSIS — E1159 Type 2 diabetes mellitus with other circulatory complications: Secondary | ICD-10-CM | POA: Insufficient documentation

## 2012-06-16 MED ORDER — LISINOPRIL-HYDROCHLOROTHIAZIDE 10-12.5 MG PO TABS: 1.0000 | ORAL_TABLET | Freq: Every day | ORAL | Status: DC

## 2012-06-16 NOTE — Progress Notes (Signed)
  Subjective:    Patient ID: Donald Richardson, male    DOB: 09/15/1940, 72 y.o.   MRN: [id number]  HPI Is here for routine followup on his diabetes. He continues on medications listed in the chart. He is having no difficulty with them. He does not exercise regularly, eats whatever he wants and does not check his blood sugars regularly. He continues to smoke and has no interest in quitting. He has had no chest pain, shortness of breath, PND or DOE.  He has not checked his feet regularly.    Review of Systems     Objective:   Physical Exam Alert and in no distress. Hemoglobin A1c not done.       Assessment & Plan:  HYPERLIPIDEMIA-MIXED  TOBACCO ABUSE  Diabetes mellitus  Hypertension associated with diabetes - Plan: lisinopril-hydrochlorothiazide (PRINZIDE,ZESTORETIC) 10-12.5 MG per tablet he has not seen his cardiologist in quite some time. I encouraged him to set up an appointment. He plans to do this after he sees me in August. He is again unwilling to change his lifestyle in regard to checking his blood sugars, feet and quit smoking.

## 2012-06-16 NOTE — Patient Instructions (Signed)
Check your sugars couple times per week and if you're above 150 let me know

## 2012-06-18 ENCOUNTER — Ambulatory Visit: Payer: Medicare Other | Admitting: Family Medicine

## 2012-07-22 ENCOUNTER — Ambulatory Visit (INDEPENDENT_AMBULATORY_CARE_PROVIDER_SITE_OTHER): Payer: Medicare Other | Admitting: Internal Medicine

## 2012-07-22 ENCOUNTER — Encounter: Payer: Self-pay | Admitting: Internal Medicine

## 2012-07-22 VITALS — BP 118/70 | HR 50 | Ht 68.0 in | Wt 175.0 lb

## 2012-07-22 DIAGNOSIS — I2510 Atherosclerotic heart disease of native coronary artery without angina pectoris: Secondary | ICD-10-CM | POA: Diagnosis not present

## 2012-07-22 DIAGNOSIS — F17200 Nicotine dependence, unspecified, uncomplicated: Secondary | ICD-10-CM

## 2012-07-22 DIAGNOSIS — E785 Hyperlipidemia, unspecified: Secondary | ICD-10-CM | POA: Diagnosis not present

## 2012-07-22 DIAGNOSIS — E119 Type 2 diabetes mellitus without complications: Secondary | ICD-10-CM

## 2012-07-22 LAB — CBC WITH DIFFERENTIAL/PLATELET
Basophils Absolute: 0 10*3/uL (ref 0.0–0.1)
HCT: 44.6 % (ref 39.0–52.0)
Lymphs Abs: 1.4 10*3/uL (ref 0.7–4.0)
MCV: 89.2 fl (ref 78.0–100.0)
Monocytes Absolute: 0.8 10*3/uL (ref 0.1–1.0)
Monocytes Relative: 10.5 % (ref 3.0–12.0)
Neutrophils Relative %: 65.2 % (ref 43.0–77.0)
Platelets: 122 10*3/uL — ABNORMAL LOW (ref 150.0–400.0)
RDW: 14.1 % (ref 11.5–14.6)

## 2012-07-22 LAB — LIPID PANEL
HDL: 28 mg/dL — ABNORMAL LOW (ref >39.00)
Total CHOL/HDL Ratio: 4
Triglycerides: 196 mg/dL — ABNORMAL HIGH (ref 0.0–149.0)
VLDL: 39.2 mg/dL (ref 0.0–40.0)

## 2012-07-22 LAB — BASIC METABOLIC PANEL
Calcium: 8.7 mg/dL (ref 8.4–10.5)
GFR: 46.23 mL/min — ABNORMAL LOW (ref >60.00)
Potassium: 4.2 mEq/L (ref 3.5–5.1)
Sodium: 140 mEq/L (ref 135–145)

## 2012-07-22 NOTE — Progress Notes (Addendum)
HPI Donald Richardson is a 72 year old with a history of CAD. 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  He presented with [place] fall 2011. He r/o for MI He underwent cardiac cath that showe: 60% prox LAD; 65% mid LAD lesion); 99% OM2; 90% instent restenosis of the RCA. 80% distal RCA; 60% PLSA. He underwent PTCA/DES to the OM and PTCA/DES x 2 to the RCA.   Since in clinic lasthe denies CP  Breathing is stable.  No dizziness He continues to smoke about a ppd.   No Known Allergies  Current Outpatient Prescriptions  Medication Sig Dispense Refill  . aspirin 81 MG tablet Take 81 mg by mouth daily.        . bimatoprost (LUMIGAN) 0.03 % ophthalmic solution 1 drop at bedtime.        Marland Kitchen EFFIENT 10 MG TABS TAKE 1 TABLET BY MOUTH EVERY DAY  30 tablet  3  . lisinopril-hydrochlorothiazide (PRINZIDE,ZESTORETIC) 10-12.5 MG per tablet Take 1 tablet by mouth daily.  90 tablet  3  . Multiple Vitamins-Minerals (MULTIVITAMIN WITH MINERALS) tablet Take 1 tablet by mouth daily.        . rosuvastatin (CRESTOR) 40 MG tablet Take 1 tablet (40 mg total) by mouth daily.  90 tablet  3   No current facility-administered medications for this visit.    Past Medical History  Diagnosis Date  . ASHD (arteriosclerotic heart disease)     STENT  . Smoker   . Hemorrhoids   . Diabetes mellitus   . Hyperlipidemia     No past surgical history on file.  No family history on file.  History   Social History  . Marital Status: Single    Spouse Name: N/A    Number of Children: N/A  . Years of Education: N/A   Occupational History  . Not on file.   Social History Main Topics  . Smoking status: Current Every Day Smoker  . Smokeless tobacco: Never Used  . Alcohol Use: No  . Drug Use: No  . Sexually Active: Not Currently   Other Topics Concern  . Not on file   Social History Narrative  . No narrative on file    Review of Systems:  All systems reviewed.  They are negative to the above problem except as previously  stated.  Vital Signs: BP 118/70  Pulse 50  Ht 5\' 8"  (1.727 m)  Wt 175 lb (79.379 kg)  BMI 26.61 kg/m2  Physical Exam  HEENT:  Normocephalic, atraumatic. EOMI, PERRLA.  Neck: JVP is normal.  No bruits.  Lungs: clear to auscultation. No rales no wheezes.  Heart: Regular rate and rhythm. Normal S1, S2. No S3.   No significant murmurs. PMI not displaced.  Abdomen:  Supple, nontender. Normal bowel sounds. No masses. No hepatomegaly.  Extremities:   Good distal pulses throughout. No lower extremity edema.  Musculoskeletal :moving all extremities.  Neuro:   alert and oriented x3.  CN II-XII grossly intact.  EKG  Sinus bradycardia  50 bpm  Occasional PVC.   Lateral MI.  IWMI. Assessment and Plan:  1.  CAD  Patient remains symptom free.  With the signif disease that he has with one of areas of stenting was in an area of restenosis I would recomm continuing dual plt inhibitors.  Wil check labs.  2.  HTN  Good control  3.  HL  Keep on statin.  WIll check labsss   4.  Tob.  Counselled on cessation.  WIll get PA/Lat CXR.     F/u in 1 year.

## 2012-07-22 NOTE — Patient Instructions (Addendum)
Your physician wants you to follow-up in: 1 year with Dr. Tenny Craw.  You will receive a reminder letter in the mail two months in advance. If you don't receive a letter, please call our office to schedule the follow-up appointment.  LABS TODAY:  Cbc, bmet, hgbA1c, lipids  A chest x-ray takes a picture of the organs and structures inside the chest, including the heart, lungs, and blood vessels. This test can show several things, including, whether the heart is enlarges; whether fluid is building up in the lungs; and whether pacemaker / defibrillator leads are still in place.  To be done [facility] on [facility].

## 2012-07-23 ENCOUNTER — Ambulatory Visit (INDEPENDENT_AMBULATORY_CARE_PROVIDER_SITE_OTHER)
Admission: RE | Admit: 2012-07-23 | Discharge: 2012-07-23 | Disposition: A | Discharge status: Home / Self Care | Payer: Medicare Other | Source: Ambulatory Visit | Attending: Internal Medicine | Admitting: Internal Medicine

## 2012-07-23 DIAGNOSIS — F17200 Nicotine dependence, unspecified, uncomplicated: Secondary | ICD-10-CM | POA: Diagnosis not present

## 2012-07-23 DIAGNOSIS — J438 Other emphysema: Secondary | ICD-10-CM | POA: Diagnosis not present

## 2012-08-13 DIAGNOSIS — E1139 Type 2 diabetes mellitus with other diabetic ophthalmic complication: Secondary | ICD-10-CM | POA: Diagnosis not present

## 2012-08-13 DIAGNOSIS — H4011X0 Primary open-angle glaucoma, stage unspecified: Secondary | ICD-10-CM | POA: Diagnosis not present

## 2012-08-26 ENCOUNTER — Other Ambulatory Visit: Payer: Self-pay | Admitting: *Deleted

## 2012-08-26 DIAGNOSIS — R9389 Abnormal findings on diagnostic imaging of other specified body structures: Secondary | ICD-10-CM

## 2012-09-02 ENCOUNTER — Ambulatory Visit (INDEPENDENT_AMBULATORY_CARE_PROVIDER_SITE_OTHER)
Admission: RE | Admit: 2012-09-02 | Discharge: 2012-09-02 | Disposition: A | Discharge status: Home / Self Care | Payer: Medicare Other | Source: Ambulatory Visit | Attending: Internal Medicine | Admitting: Internal Medicine

## 2012-09-02 DIAGNOSIS — R918 Other nonspecific abnormal finding of lung field: Secondary | ICD-10-CM

## 2012-09-02 DIAGNOSIS — J438 Other emphysema: Secondary | ICD-10-CM | POA: Diagnosis not present

## 2012-09-02 DIAGNOSIS — R9389 Abnormal findings on diagnostic imaging of other specified body structures: Secondary | ICD-10-CM

## 2012-09-02 MED ORDER — IOHEXOL 300 MG/ML  SOLN
80.0000 mL | Freq: Once | INTRAMUSCULAR | Status: AC | PRN
  Administered 2012-09-02: 80 mL via INTRAVENOUS

## 2012-10-21 ENCOUNTER — Ambulatory Visit (INDEPENDENT_AMBULATORY_CARE_PROVIDER_SITE_OTHER): Payer: Medicare Other | Admitting: Family Medicine

## 2012-10-21 ENCOUNTER — Encounter: Payer: Self-pay | Admitting: Family Medicine

## 2012-10-21 VITALS — BP 120/70 | HR 60 | Wt 166.0 lb

## 2012-10-21 DIAGNOSIS — E785 Hyperlipidemia, unspecified: Secondary | ICD-10-CM | POA: Diagnosis not present

## 2012-10-21 DIAGNOSIS — I10 Essential (primary) hypertension: Secondary | ICD-10-CM

## 2012-10-21 DIAGNOSIS — E1169 Type 2 diabetes mellitus with other specified complication: Secondary | ICD-10-CM

## 2012-10-21 DIAGNOSIS — F17200 Nicotine dependence, unspecified, uncomplicated: Secondary | ICD-10-CM | POA: Diagnosis not present

## 2012-10-21 DIAGNOSIS — E1159 Type 2 diabetes mellitus with other circulatory complications: Secondary | ICD-10-CM

## 2012-10-21 DIAGNOSIS — E119 Type 2 diabetes mellitus without complications: Secondary | ICD-10-CM | POA: Diagnosis not present

## 2012-10-21 NOTE — Patient Instructions (Signed)
Stay away from sugar drinks and deserts

## 2012-10-21 NOTE — Progress Notes (Signed)
  Subjective:    Donald Richardson is a 72 y.o. male who presents for follow-up of Type 2 diabetes mellitus.    Home blood sugar records: NOT TAKING   Current symptoms/problem ; NONE Daily foot checks: o   Any foot concerns:NONE Last eye exam:  08/13/2012  DR.GOULD   Medication compliance: Good Current diet: NO Current exercise: NO  Known diabetic complications: cardiovascular disease Cardiovascular risk factors: advanced age (older than 45 for men, 18 for women), diabetes mellitus, dyslipidemia, hypertension, male gender, sedentary lifestyle and smoking/ tobacco exposure   The following portions of the patient's history were reviewed and updated as appropriate: allergies, current medications, past medical history, past social history and problem list.  ROS as in subjective above    Objective:    General appearence: alert, no distress, WD/WN Neck: supple, no lymphadenopathy, no thyromegaly, no masses Heart: RRR, normal S1, S2, no murmurs Lungs: CTA bilaterally, no wheezes, rhonchi, or rales Abdomen: +bs, soft, non tender, non distended, no masses, no hepatomegaly, no splenomegaly Pulses: 2+ symmetric, upper and lower extremities, normal cap refill Ext: no edema Foot exam:  Neuro: foot monofilament exam normal   Lab Review Lab Results  Component Value Date   HGBA1C 7.8* 07/22/2012   Lab Results  Component Value Date   CHOL 105 07/22/2012   HDL 28.00* 07/22/2012   LDLCALC 38 07/22/2012   LDLDIRECT 99.1 09/18/2008   TRIG 196.0* 07/22/2012   CHOLHDL 4 07/22/2012   No results found for this basenameConcepcion Richardson     Chemistry      Component Value Date/Time   NA 140 07/22/2012 1052   K 4.2 07/22/2012 1052   CL 105 07/22/2012 1052   CO2 28 07/22/2012 1052   BUN 19 07/22/2012 1052   CREATININE 1.6* 07/22/2012 1052   CREATININE 1.40* 10/22/2011 0947      Component Value Date/Time   CALCIUM 8.7 07/22/2012 1052   ALKPHOS 66 10/22/2011 0947   AST 14 10/22/2011 0947   ALT 15  10/22/2011 0947   BILITOT 0.5 10/22/2011 0947        Chemistry      Component Value Date/Time   NA 140 07/22/2012 1052   K 4.2 07/22/2012 1052   CL 105 07/22/2012 1052   CO2 28 07/22/2012 1052   BUN 19 07/22/2012 1052   CREATININE 1.6* 07/22/2012 1052   CREATININE 1.40* 10/22/2011 0947      Component Value Date/Time   CALCIUM 8.7 07/22/2012 1052   ALKPHOS 66 10/22/2011 0947   AST 14 10/22/2011 0947   ALT 15 10/22/2011 0947   BILITOT 0.5 10/22/2011 0947     Hb A1c 6.3     Assessment:  Diabetes mellitus - Plan: POCT glycosylated hemoglobin (Hb A1C)  TOBACCO ABUSE  Hypertension associated with diabetes  HYPERLIPIDEMIA-MIXED       Plan:    1.  Rx changes: none 2.  Education: Reviewed 'ABCs' of diabetes management (respective goals in parentheses):  A1C (<7), blood pressure (<130/80), and cholesterol (LDL <100). 3.  Compliance at present is estimated to be fair. Efforts to improve compliance (if necessary) will be directed at Encouraged him to avoid sugary drinks and desserts. 4. Follow up: 4 months

## 2012-11-12 DIAGNOSIS — H4011X0 Primary open-angle glaucoma, stage unspecified: Secondary | ICD-10-CM | POA: Diagnosis not present

## 2012-11-12 DIAGNOSIS — H2510 Age-related nuclear cataract, unspecified eye: Secondary | ICD-10-CM | POA: Diagnosis not present

## 2012-11-24 DIAGNOSIS — Z86010 Personal history of colonic polyps: Secondary | ICD-10-CM | POA: Diagnosis not present

## 2012-11-24 DIAGNOSIS — I2510 Atherosclerotic heart disease of native coronary artery without angina pectoris: Secondary | ICD-10-CM | POA: Diagnosis not present

## 2012-12-06 ENCOUNTER — Telehealth: Payer: Self-pay | Admitting: Internal Medicine

## 2012-12-06 NOTE — Telephone Encounter (Signed)
OK to stop Effient Resume after. Please see if we have any samples of Effient that we can give him.

## 2012-12-06 NOTE — Telephone Encounter (Signed)
New problem    Upcoming colonoscopy on  10/14 . Pt need to stop effient .

## 2012-12-06 NOTE — Telephone Encounter (Signed)
I will forward to DR/nurse for further advice.

## 2012-12-07 NOTE — Telephone Encounter (Signed)
Spoke with pt, aware of dr Tenny Craw recommendations. Will send this telephone note to dr hung office. The pt does not need samples at this time.

## 2012-12-14 DIAGNOSIS — Z86010 Personal history of colonic polyps: Secondary | ICD-10-CM | POA: Diagnosis not present

## 2012-12-14 DIAGNOSIS — D126 Benign neoplasm of colon, unspecified: Secondary | ICD-10-CM | POA: Diagnosis not present

## 2012-12-14 DIAGNOSIS — Z1211 Encounter for screening for malignant neoplasm of colon: Secondary | ICD-10-CM | POA: Diagnosis not present

## 2012-12-14 DIAGNOSIS — K5730 Diverticulosis of large intestine without perforation or abscess without bleeding: Secondary | ICD-10-CM | POA: Diagnosis not present

## 2012-12-14 DIAGNOSIS — K6389 Other specified diseases of intestine: Secondary | ICD-10-CM | POA: Diagnosis not present

## 2012-12-14 DIAGNOSIS — K649 Unspecified hemorrhoids: Secondary | ICD-10-CM | POA: Diagnosis not present

## 2012-12-14 DIAGNOSIS — K5289 Other specified noninfective gastroenteritis and colitis: Secondary | ICD-10-CM | POA: Diagnosis not present

## 2012-12-23 ENCOUNTER — Encounter: Payer: Self-pay | Admitting: Family Medicine

## 2013-02-11 DIAGNOSIS — H2510 Age-related nuclear cataract, unspecified eye: Secondary | ICD-10-CM | POA: Diagnosis not present

## 2013-02-11 DIAGNOSIS — H4011X0 Primary open-angle glaucoma, stage unspecified: Secondary | ICD-10-CM | POA: Diagnosis not present

## 2013-02-15 ENCOUNTER — Ambulatory Visit (INDEPENDENT_AMBULATORY_CARE_PROVIDER_SITE_OTHER): Payer: Medicare Other | Admitting: Family Medicine

## 2013-02-15 ENCOUNTER — Encounter: Payer: Self-pay | Admitting: Family Medicine

## 2013-02-15 VITALS — BP 124/70 | HR 90 | Wt 164.0 lb

## 2013-02-15 DIAGNOSIS — I2510 Atherosclerotic heart disease of native coronary artery without angina pectoris: Secondary | ICD-10-CM | POA: Diagnosis not present

## 2013-02-15 DIAGNOSIS — E119 Type 2 diabetes mellitus without complications: Secondary | ICD-10-CM | POA: Diagnosis not present

## 2013-02-15 DIAGNOSIS — E1169 Type 2 diabetes mellitus with other specified complication: Secondary | ICD-10-CM | POA: Diagnosis not present

## 2013-02-15 DIAGNOSIS — F17200 Nicotine dependence, unspecified, uncomplicated: Secondary | ICD-10-CM

## 2013-02-15 DIAGNOSIS — E785 Hyperlipidemia, unspecified: Secondary | ICD-10-CM

## 2013-02-15 DIAGNOSIS — E1159 Type 2 diabetes mellitus with other circulatory complications: Secondary | ICD-10-CM

## 2013-02-15 DIAGNOSIS — I10 Essential (primary) hypertension: Secondary | ICD-10-CM

## 2013-02-15 NOTE — Progress Notes (Signed)
  Subjective:    Donald Richardson is a 71 y.o. male who presents for follow-up of Type 2 diabetes mellitus.    Home blood sugar records: NO  Current symptoms/problems NO Daily foot checks:   Any foot concerns:NONE Last eye exam:  01/2013 [facility]   Medication compliance:adequate Current diet: NO Current exercise: GETS UP MOVES AROUND Known diabetic complications: cardiovascular disease Cardiovascular risk factors: advanced age (older than 53 for men, 4 for women), diabetes mellitus, dyslipidemia, hypertension, male gender, sedentary lifestyle and smoking/ tobacco exposure Again he is not interested in quitting smoking.  The following portions of the patient's history were reviewed and updated as appropriate: allergies, current medications, past family history, past medical history, past social history and problem list.  ROS as in subjective above    Objective:    Wt 164 lb (74.39 kg)  There were no vitals filed for this visit.  General appearence: alert, no distress, WD/WN  Lab Review Lab Results  Component Value Date   HGBA1C 6.3 10/21/2012   Lab Results  Component Value Date   CHOL 105 07/22/2012   HDL 28.00* 07/22/2012   LDLCALC 38 07/22/2012   LDLDIRECT 99.1 09/18/2008   TRIG 196.0* 07/22/2012   CHOLHDL 4 07/22/2012   No results found for this basename: Concepcion Elk     Chemistry      Component Value Date/Time   NA 140 07/22/2012 1052   K 4.2 07/22/2012 1052   CL 105 07/22/2012 1052   CO2 28 07/22/2012 1052   BUN 19 07/22/2012 1052   CREATININE 1.6* 07/22/2012 1052   CREATININE 1.40* 10/22/2011 0947      Component Value Date/Time   CALCIUM 8.7 07/22/2012 1052   ALKPHOS 66 10/22/2011 0947   AST 14 10/22/2011 0947   ALT 15 10/22/2011 0947   BILITOT 0.5 10/22/2011 0947        Chemistry      Component Value Date/Time   NA 140 07/22/2012 1052   K 4.2 07/22/2012 1052   CL 105 07/22/2012 1052   CO2 28 07/22/2012 1052   BUN 19 07/22/2012 1052   CREATININE 1.6*  07/22/2012 1052   CREATININE 1.40* 10/22/2011 0947      Component Value Date/Time   CALCIUM 8.7 07/22/2012 1052   ALKPHOS 66 10/22/2011 0947   AST 14 10/22/2011 0947   ALT 15 10/22/2011 0947   BILITOT 0.5 10/22/2011 0947     Hemoglobin A1c 6.2 Assessment:   Encounter Diagnoses  Name Primary?  . Diabetes mellitus Yes  . Hypertension associated with diabetes   . TOBACCO ABUSE   . HYPERLIPIDEMIA-MIXED   . CAD, NATIVE VESSEL          Plan:    1.  Rx changes: none 2.  Education: Reviewed 'ABCs' of diabetes management (respective goals in parentheses):  A1C (<7), blood pressure (<130/80), and cholesterol (LDL <100). 3.  Compliance at present is estimated to be fair. Efforts to improve compliance (if necessary) will be directed at increased exercise. 4. Follow up: 4 months

## 2013-03-18 DIAGNOSIS — H2510 Age-related nuclear cataract, unspecified eye: Secondary | ICD-10-CM | POA: Diagnosis not present

## 2013-03-18 DIAGNOSIS — H4011X0 Primary open-angle glaucoma, stage unspecified: Secondary | ICD-10-CM | POA: Diagnosis not present

## 2013-04-18 ENCOUNTER — Other Ambulatory Visit: Payer: Self-pay | Admitting: Internal Medicine

## 2013-06-15 DIAGNOSIS — H4011X0 Primary open-angle glaucoma, stage unspecified: Secondary | ICD-10-CM | POA: Diagnosis not present

## 2013-06-16 ENCOUNTER — Encounter: Payer: Self-pay | Admitting: Family Medicine

## 2013-06-16 ENCOUNTER — Ambulatory Visit (INDEPENDENT_AMBULATORY_CARE_PROVIDER_SITE_OTHER): Payer: Medicare Other | Admitting: Family Medicine

## 2013-06-16 VITALS — BP 120/60 | HR 50 | Wt 167.0 lb

## 2013-06-16 DIAGNOSIS — I152 Hypertension secondary to endocrine disorders: Secondary | ICD-10-CM

## 2013-06-16 DIAGNOSIS — E785 Hyperlipidemia, unspecified: Secondary | ICD-10-CM | POA: Diagnosis not present

## 2013-06-16 DIAGNOSIS — E119 Type 2 diabetes mellitus without complications: Secondary | ICD-10-CM | POA: Diagnosis not present

## 2013-06-16 DIAGNOSIS — E1169 Type 2 diabetes mellitus with other specified complication: Secondary | ICD-10-CM | POA: Diagnosis not present

## 2013-06-16 DIAGNOSIS — I2510 Atherosclerotic heart disease of native coronary artery without angina pectoris: Secondary | ICD-10-CM

## 2013-06-16 DIAGNOSIS — F17200 Nicotine dependence, unspecified, uncomplicated: Secondary | ICD-10-CM

## 2013-06-16 DIAGNOSIS — I10 Essential (primary) hypertension: Secondary | ICD-10-CM

## 2013-06-16 DIAGNOSIS — B351 Tinea unguium: Secondary | ICD-10-CM

## 2013-06-16 DIAGNOSIS — E1159 Type 2 diabetes mellitus with other circulatory complications: Secondary | ICD-10-CM

## 2013-06-16 DIAGNOSIS — Z79899 Other long term (current) drug therapy: Secondary | ICD-10-CM | POA: Diagnosis not present

## 2013-06-16 LAB — LIPID PANEL
CHOLESTEROL: 114 mg/dL (ref 0–200)
HDL: 32 mg/dL — ABNORMAL LOW (ref >39)
LDL Cholesterol: 56 mg/dL (ref 0–99)
TRIGLYCERIDES: 131 mg/dL (ref <150)
Total CHOL/HDL Ratio: 3.6 Ratio
VLDL: 26 mg/dL (ref 0–40)

## 2013-06-16 LAB — COMPREHENSIVE METABOLIC PANEL
ALBUMIN: 3.8 g/dL (ref 3.5–5.2)
ALT: 14 U/L (ref 0–53)
AST: 14 U/L (ref 0–37)
Alkaline Phosphatase: 79 U/L (ref 39–117)
BILIRUBIN TOTAL: 0.4 mg/dL (ref 0.2–1.2)
BUN: 27 mg/dL — AB (ref 6–23)
CO2: 30 meq/L (ref 19–32)
Calcium: 8.7 mg/dL (ref 8.4–10.5)
Chloride: 104 mEq/L (ref 96–112)
Creat: 1.48 mg/dL — ABNORMAL HIGH (ref 0.50–1.35)
GLUCOSE: 118 mg/dL — AB (ref 70–99)
POTASSIUM: 4.3 meq/L (ref 3.5–5.3)
SODIUM: 140 meq/L (ref 135–145)
Total Protein: 6.9 g/dL (ref 6.0–8.3)

## 2013-06-16 LAB — CBC WITH DIFFERENTIAL/PLATELET
Basophils Absolute: 0 10*3/uL (ref 0.0–0.1)
Basophils Relative: 0 % (ref 0–1)
EOS ABS: 0.3 10*3/uL (ref 0.0–0.7)
Eosinophils Relative: 4 % (ref 0–5)
HCT: 47.2 % (ref 39.0–52.0)
Hemoglobin: 16.1 g/dL (ref 13.0–17.0)
LYMPHS ABS: 1.3 10*3/uL (ref 0.7–4.0)
Lymphocytes Relative: 19 % (ref 12–46)
MCH: 30.7 pg (ref 26.0–34.0)
MCHC: 34.1 g/dL (ref 30.0–36.0)
MCV: 89.9 fL (ref 78.0–100.0)
MONO ABS: 0.7 10*3/uL (ref 0.1–1.0)
MONOS PCT: 10 % (ref 3–12)
NEUTROS PCT: 67 % (ref 43–77)
Neutro Abs: 4.6 10*3/uL (ref 1.7–7.7)
Platelets: 138 10*3/uL — ABNORMAL LOW (ref 150–400)
RBC: 5.25 MIL/uL (ref 4.22–5.81)
RDW: 14.4 % (ref 11.5–15.5)
WBC: 6.8 10*3/uL (ref 4.0–10.5)

## 2013-06-16 LAB — POCT GLYCOSYLATED HEMOGLOBIN (HGB A1C): Hemoglobin A1C: 6.7

## 2013-06-16 NOTE — Progress Notes (Signed)
   Subjective:    Patient ID: Donald Richardson, male    DOB: 1940-12-24, 73 y.o.   MRN: [id number]  HPI He is here for a followup visit. He does get regular followup with his cardiologist. He has had no chest pain, shortness of breath. He continues to smoke and has no interest in quitting. He does walk regularly. He recently had an I. exam. He does check his feet daily. He continues on medications listed in the chart. He does not check his blood sugars. Rarely drinks.   Review of Systems     Objective:   Physical Exam Alert and in no distress. Hemoglobin A1c is 6.7  exam done and does show evidence of onychomycosis       Assessment & Plan:  Diabetes mellitus - Plan: HgB A1c, CBC with Differential, Comprehensive metabolic panel, Lipid panel, POCT UA - Microalbumin  CAD, NATIVE VESSEL - Plan: CBC with Differential, Comprehensive metabolic panel, Lipid panel  HYPERLIPIDEMIA-MIXED - Plan: Lipid panel  Hypertension associated with diabetes - Plan: CBC with Differential, Comprehensive metabolic panel  Onychomycosis  TOBACCO ABUSE  I encouraged him to continue to check his feet regularly and keep his nails trimmed. Do not feel the need to treat his onychomycosis at the present time to

## 2013-07-28 NOTE — Progress Notes (Signed)
HPI Donald Richardson is a 73 year old with a history of CAD. He is s/p IWMI 2002  with PTCA/stent x 2 to the RCA.  He presented with [place] fall 2011. He r/o for MI He underwent cardiac cath that showe: 60% prox LAD; 65% mid LAD lesion); 99% OM2; 90% instent restenosis of the RCA. 80% distal RCA; 60% PLSA. He underwent PTCA/DES to the OM and PTCA/DES x 2 to the RCA.   He was last in clinic in Spring 2014  SInce seen he denies CP  Breathing is OK  He is still smoking  Remains fairly active  Does not exercise regularly but is active No Known Allergies  Current Outpatient Prescriptions  Medication Sig Dispense Refill  . aspirin 81 MG tablet Take 81 mg by mouth daily.        . bimatoprost (LUMIGAN) 0.03 % ophthalmic solution 1 drop at bedtime.        . CRESTOR 40 MG tablet TAKE 1 TABLET BY MOUTH DAILY  90 tablet  0  . lisinopril-hydrochlorothiazide (PRINZIDE,ZESTORETIC) 10-12.5 MG per tablet Take 1 tablet by mouth daily.  90 tablet  3  . clopidogrel (PLAVIX) 75 MG tablet Take 1 tablet (75 mg total) by mouth daily.  90 tablet  3   No current facility-administered medications for this visit.    Past Medical History  Diagnosis Date  . ASHD (arteriosclerotic heart disease)     STENT  . Smoker   . Hemorrhoids   . Diabetes mellitus   . Hyperlipidemia     No past surgical history on file.  No family history on file.  History   Social History  . Marital Status: Single    Spouse Name: N/A    Number of Children: N/A  . Years of Education: N/A   Occupational History  . Not on file.   Social History Main Topics  . Smoking status: Current Every Day Smoker  . Smokeless tobacco: Never Used  . Alcohol Use: No  . Drug Use: No  . Sexual Activity: Not Currently   Other Topics Concern  . Not on file   Social History Narrative  . No narrative on file    Review of Systems:  All systems reviewed.  They are negative to the above problem except as previously stated.  Vital Signs: BP 110/72   Pulse 60  Ht 5\' 8"  (1.727 m)  Wt 164 lb (74.39 kg)  BMI 24.94 kg/m2  Physical Exam [place] is in NAD HEENT:  Normocephalic, atraumatic. EOMI, PERRLA.  Neck: JVP is normal.  No bruits.  Lungs: clear to auscultation. No rales no wheezes.  Heart: Regular rate and rhythm. Normal S1, S2. No S3.   No significant murmurs. PMI not displaced.  Abdomen:  Supple, nontender. Normal bowel sounds. No masses. No hepatomegaly.  Extremities:   Good distal pulses throughout. No lower extremity edema.  Musculoskeletal :moving all extremities.  Neuro:   alert and oriented x3.  CN II-XII grossly intact.  EKG  Sinus bradycardia  50 bpm  Occasional PVC.   Lateral MI.  IWMI. Assessment and Plan:  1.  CAD  Patient remains symptom free.  With the signif disease that he has with one of areas of stenting was in an area of restenosis I would recomm continuing dual plt inhibitors. WIll switch to Plavix    2.  HTN  Good control  3.  HL  Keep on statin.    4.  Tob.  Counselled on cessation.  Patient does not want to stop.  5. HCM  With history of tob use and CAD will set up for screening USN of aorta.     F/u in 1 year.

## 2013-07-29 ENCOUNTER — Encounter: Payer: Self-pay | Admitting: Internal Medicine

## 2013-07-29 ENCOUNTER — Ambulatory Visit (INDEPENDENT_AMBULATORY_CARE_PROVIDER_SITE_OTHER): Payer: Medicare Other | Admitting: Internal Medicine

## 2013-07-29 VITALS — BP 110/72 | HR 60 | Ht 68.0 in | Wt 164.0 lb

## 2013-07-29 DIAGNOSIS — F17200 Nicotine dependence, unspecified, uncomplicated: Secondary | ICD-10-CM | POA: Diagnosis not present

## 2013-07-29 DIAGNOSIS — Z720 Tobacco use: Secondary | ICD-10-CM

## 2013-07-29 DIAGNOSIS — I2510 Atherosclerotic heart disease of native coronary artery without angina pectoris: Secondary | ICD-10-CM | POA: Diagnosis not present

## 2013-07-29 DIAGNOSIS — I10 Essential (primary) hypertension: Secondary | ICD-10-CM

## 2013-07-29 DIAGNOSIS — E785 Hyperlipidemia, unspecified: Secondary | ICD-10-CM

## 2013-07-29 DIAGNOSIS — E1169 Type 2 diabetes mellitus with other specified complication: Secondary | ICD-10-CM

## 2013-07-29 DIAGNOSIS — E1159 Type 2 diabetes mellitus with other circulatory complications: Secondary | ICD-10-CM

## 2013-07-29 MED ORDER — CLOPIDOGREL BISULFATE 75 MG PO TABS: 75.0000 mg | ORAL_TABLET | Freq: Every day | ORAL | Status: DC

## 2013-07-29 NOTE — Patient Instructions (Signed)
Your physician has recommended you make the following change in your medication: FINISH YOUR EFFIENT PRESCRIPTION, THEN BEGIN PLAVIX [address] [place] physician has requested that you have an abdominal aorta duplex. During this test, an ultrasound is used to evaluate the aorta. Allow 30 minutes for this exam. Do not eat after midnight the day before and avoid carbonated beverages  Your physician wants you to follow-up in: [place].   You will receive a reminder letter in the mail two months in advance. If you don't receive a letter, please call our office to schedule the follow-up appointment.

## 2013-08-11 ENCOUNTER — Ambulatory Visit (HOSPITAL_COMMUNITY): Payer: Medicare Other | Attending: Internal Medicine | Admitting: Cardiology

## 2013-08-11 DIAGNOSIS — I714 Abdominal aortic aneurysm, without rupture, unspecified: Secondary | ICD-10-CM | POA: Diagnosis not present

## 2013-08-11 DIAGNOSIS — I10 Essential (primary) hypertension: Principal | ICD-10-CM

## 2013-08-11 DIAGNOSIS — I152 Hypertension secondary to endocrine disorders: Secondary | ICD-10-CM

## 2013-08-11 DIAGNOSIS — E1159 Type 2 diabetes mellitus with other circulatory complications: Secondary | ICD-10-CM

## 2013-08-11 DIAGNOSIS — Z720 Tobacco use: Secondary | ICD-10-CM

## 2013-08-11 DIAGNOSIS — I2510 Atherosclerotic heart disease of native coronary artery without angina pectoris: Secondary | ICD-10-CM

## 2013-08-11 DIAGNOSIS — E785 Hyperlipidemia, unspecified: Secondary | ICD-10-CM

## 2013-08-11 NOTE — Progress Notes (Signed)
Abdominal aorta duplex completed.  

## 2013-09-09 ENCOUNTER — Other Ambulatory Visit: Payer: Self-pay | Admitting: Family Medicine

## 2013-09-14 DIAGNOSIS — H2510 Age-related nuclear cataract, unspecified eye: Secondary | ICD-10-CM | POA: Diagnosis not present

## 2013-09-14 DIAGNOSIS — H4011X0 Primary open-angle glaucoma, stage unspecified: Secondary | ICD-10-CM | POA: Diagnosis not present

## 2013-10-17 ENCOUNTER — Ambulatory Visit (INDEPENDENT_AMBULATORY_CARE_PROVIDER_SITE_OTHER): Payer: Medicare Other | Admitting: Family Medicine

## 2013-10-17 ENCOUNTER — Encounter: Payer: Self-pay | Admitting: Family Medicine

## 2013-10-17 ENCOUNTER — Other Ambulatory Visit: Payer: Self-pay | Admitting: Internal Medicine

## 2013-10-17 VITALS — BP 122/70 | HR 58 | Wt 163.0 lb

## 2013-10-17 DIAGNOSIS — E785 Hyperlipidemia, unspecified: Secondary | ICD-10-CM

## 2013-10-17 DIAGNOSIS — I10 Essential (primary) hypertension: Secondary | ICD-10-CM

## 2013-10-17 DIAGNOSIS — F17200 Nicotine dependence, unspecified, uncomplicated: Secondary | ICD-10-CM | POA: Diagnosis not present

## 2013-10-17 DIAGNOSIS — I2510 Atherosclerotic heart disease of native coronary artery without angina pectoris: Secondary | ICD-10-CM | POA: Diagnosis not present

## 2013-10-17 DIAGNOSIS — Z23 Encounter for immunization: Secondary | ICD-10-CM

## 2013-10-17 DIAGNOSIS — E1159 Type 2 diabetes mellitus with other circulatory complications: Secondary | ICD-10-CM

## 2013-10-17 DIAGNOSIS — E1169 Type 2 diabetes mellitus with other specified complication: Secondary | ICD-10-CM | POA: Diagnosis not present

## 2013-10-17 DIAGNOSIS — E119 Type 2 diabetes mellitus without complications: Secondary | ICD-10-CM

## 2013-10-17 LAB — POCT GLYCOSYLATED HEMOGLOBIN (HGB A1C): HEMOGLOBIN A1C: 6.4

## 2013-10-17 NOTE — Progress Notes (Signed)
  Subjective:    Donald Richardson is a 73 y.o. male who presents for follow-up of Type 2 diabetes mellitus.    Home blood sugar records: NONE  Current symptoms/problems NONE Daily foot checks:  Any foot concerns: NONE Last eye exam:  08/31/13 [facility]   Medication compliance: Good Current diet NONE Current exercise: WALKING Known diabetic complications: impotence and cardiovascular disease Cardiovascular risk factors: advanced age (older than 17 for men, 25 for women), diabetes mellitus, dyslipidemia, hypertension, male gender and smoking/ tobacco exposure   ROS as in subjective above    Objective:   General appearence: alert, no distress, WD/WN  Foot exam:  Neuro: foot monofilament exam normal   Lab Review Lab Results  Component Value Date   HGBA1C 6.7 06/16/2013   Lab Results  Component Value Date   CHOL 114 06/16/2013   HDL 32* 06/16/2013   LDLCALC 56 06/16/2013   LDLDIRECT 99.1 09/18/2008   TRIG 131 06/16/2013   CHOLHDL 3.6 06/16/2013   No results found for this basenameDerl Barrow     Chemistry      Component Value Date/Time   NA 140 06/16/2013 1459   K 4.3 06/16/2013 1459   CL 104 06/16/2013 1459   CO2 30 06/16/2013 1459   BUN 27* 06/16/2013 1459   CREATININE 1.48* 06/16/2013 1459   CREATININE 1.6* 07/22/2012 1052      Component Value Date/Time   CALCIUM 8.7 06/16/2013 1459   ALKPHOS 79 06/16/2013 1459   AST 14 06/16/2013 1459   ALT 14 06/16/2013 1459   BILITOT 0.4 06/16/2013 1459        Chemistry      Component Value Date/Time   NA 140 06/16/2013 1459   K 4.3 06/16/2013 1459   CL 104 06/16/2013 1459   CO2 30 06/16/2013 1459   BUN 27* 06/16/2013 1459   CREATININE 1.48* 06/16/2013 1459   CREATININE 1.6* 07/22/2012 1052      Component Value Date/Time   CALCIUM 8.7 06/16/2013 1459   ALKPHOS 79 06/16/2013 1459   AST 14 06/16/2013 1459   ALT 14 06/16/2013 1459   BILITOT 0.4 06/16/2013 1459         Assessment:  TOBACCO ABUSE  Hypertension associated with  diabetes  HYPERLIPIDEMIA-MIXED  Type 2 diabetes mellitus without complication - Plan: POCT glycosylated hemoglobin (Hb A1C)  Need for prophylactic vaccination against Streptococcus pneumoniae (pneumococcus) - Plan: Pneumococcal polysaccharide vaccine 23-valent greater than or equal to 2yo subcutaneous/IM        Plan:    1.  Rx changes: none 2.  Education: Reviewed 'ABCs' of diabetes management (respective goals in parentheses):  A1C (<7), blood pressure (<130/80), and cholesterol (LDL <100). 3.  Compliance at present is estimated to be fair. Efforts to improve compliance (if necessary) will be directed at Again encouraged him to quit smoking. 4. Follow up: 4 months  He will also be [facility] for PFT Flu shot was offered however he refused

## 2013-10-18 LAB — PULMONARY FUNCTION TEST

## 2013-11-08 ENCOUNTER — Encounter: Payer: Self-pay | Admitting: Family Medicine

## 2013-11-09 ENCOUNTER — Telehealth: Payer: Self-pay | Admitting: Internal Medicine

## 2013-11-09 NOTE — Telephone Encounter (Signed)
Faxed over medical records to [facility] @[phone] on 11/03/13

## 2013-12-09 ENCOUNTER — Other Ambulatory Visit: Payer: Self-pay | Admitting: Family Medicine

## 2013-12-21 DIAGNOSIS — H4011X1 Primary open-angle glaucoma, mild stage: Secondary | ICD-10-CM | POA: Diagnosis not present

## 2013-12-21 DIAGNOSIS — H25041 Posterior subcapsular polar age-related cataract, right eye: Secondary | ICD-10-CM | POA: Diagnosis not present

## 2014-02-16 ENCOUNTER — Ambulatory Visit: Payer: Medicare Other | Admitting: Family Medicine

## 2014-02-27 ENCOUNTER — Encounter: Payer: Self-pay | Admitting: Family Medicine

## 2014-02-27 ENCOUNTER — Ambulatory Visit (INDEPENDENT_AMBULATORY_CARE_PROVIDER_SITE_OTHER): Payer: Medicare Other | Admitting: Family Medicine

## 2014-02-27 VITALS — BP 120/70 | HR 50 | Wt 164.0 lb

## 2014-02-27 DIAGNOSIS — Z720 Tobacco use: Secondary | ICD-10-CM | POA: Diagnosis not present

## 2014-02-27 DIAGNOSIS — I2510 Atherosclerotic heart disease of native coronary artery without angina pectoris: Secondary | ICD-10-CM | POA: Diagnosis not present

## 2014-02-27 DIAGNOSIS — E119 Type 2 diabetes mellitus without complications: Secondary | ICD-10-CM | POA: Diagnosis not present

## 2014-02-27 DIAGNOSIS — F17200 Nicotine dependence, unspecified, uncomplicated: Secondary | ICD-10-CM

## 2014-02-27 DIAGNOSIS — I10 Essential (primary) hypertension: Secondary | ICD-10-CM | POA: Diagnosis not present

## 2014-02-27 DIAGNOSIS — E1169 Type 2 diabetes mellitus with other specified complication: Secondary | ICD-10-CM | POA: Diagnosis not present

## 2014-02-27 DIAGNOSIS — E1159 Type 2 diabetes mellitus with other circulatory complications: Secondary | ICD-10-CM

## 2014-02-27 DIAGNOSIS — J449 Chronic obstructive pulmonary disease, unspecified: Secondary | ICD-10-CM | POA: Diagnosis not present

## 2014-02-27 DIAGNOSIS — E785 Hyperlipidemia, unspecified: Secondary | ICD-10-CM

## 2014-02-27 LAB — POCT GLYCOSYLATED HEMOGLOBIN (HGB A1C): Hemoglobin A1C: 6

## 2014-02-27 NOTE — Progress Notes (Signed)
  Subjective:    Donald Richardson is a 73 y.o. male who presents for follow-up of Type 2 diabetes mellitus.    Home blood sugar records: not checking  Current symptoms/problems include none Daily foot checks:  Any foot concerns: none. Checks his feet daily Last eye exam:   Dr.Gould Mar 22 2014 pt sees him every 3 mths   Medication compliance: Good Current diet: none Current exercise: working Known diabetic complications: cardiovascular disease Cardiovascular risk factors: advanced age (older than 61 for men, 14 for women), diabetes mellitus, dyslipidemia, hypertension, male gender and smoking/ tobacco exposure   The following portions of the patient's history were reviewed and updated as appropriate: allergies, current medications, past medical history, past social history and problem list.  ROS as in subjective above    Objective:   General appearence: alert, no distress, WD/WN Neck: supple, no lymphadenopathy, no thyromegaly, no masses Heart: RRR, normal S1, S2, no murmurs Lungs: CTA bilaterally, no wheezes, rhonchi, or rales Abdomen: +bs, soft, non tender, non distended, no masses, no hepatomegaly, no splenomegaly Pulses: 2+ symmetric, upper and lower extremities, normal cap refill Ext: no edema Foot exam:  Neuro: foot monofilament exam normal   Lab Review Lab Results  Component Value Date   HGBA1C 6.4 10/17/2013   Lab Results  Component Value Date   CHOL 114 06/16/2013   HDL 32* 06/16/2013   LDLCALC 56 06/16/2013   LDLDIRECT 99.1 09/18/2008   TRIG 131 06/16/2013   CHOLHDL 3.6 06/16/2013   No results found for: Derl Barrow   Chemistry      Component Value Date/Time   NA 140 06/16/2013 1459   K 4.3 06/16/2013 1459   CL 104 06/16/2013 1459   CO2 30 06/16/2013 1459   BUN 27* 06/16/2013 1459   CREATININE 1.48* 06/16/2013 1459   CREATININE 1.6* 07/22/2012 1052      Component Value Date/Time   CALCIUM 8.7 06/16/2013 1459   ALKPHOS 79 06/16/2013 1459   AST 14 06/16/2013 1459   ALT 14 06/16/2013 1459   BILITOT 0.4 06/16/2013 1459        Chemistry      Component Value Date/Time   NA 140 06/16/2013 1459   K 4.3 06/16/2013 1459   CL 104 06/16/2013 1459   CO2 30 06/16/2013 1459   BUN 27* 06/16/2013 1459   CREATININE 1.48* 06/16/2013 1459   CREATININE 1.6* 07/22/2012 1052      Component Value Date/Time   CALCIUM 8.7 06/16/2013 1459   ALKPHOS 79 06/16/2013 1459   AST 14 06/16/2013 1459   ALT 14 06/16/2013 1459   BILITOT 0.4 06/16/2013 1459       Last optometry/ophthalmology exam reviewed from:    Assessment:        Plan:    1.  Rx changes: none 2.  Education: Reviewed 'ABCs' of diabetes management (respective goals in parentheses):  A1C (<7), blood pressure (<130/80), and cholesterol (LDL <100). 3.  Compliance at present is estimated to be good. Efforts to improve compliance (if necessary) will be directed at No change. 4. Follow up: 4 months

## 2014-03-13 ENCOUNTER — Other Ambulatory Visit: Payer: Self-pay | Admitting: Family Medicine

## 2014-03-22 DIAGNOSIS — H4011X1 Primary open-angle glaucoma, mild stage: Secondary | ICD-10-CM | POA: Diagnosis not present

## 2014-05-22 ENCOUNTER — Encounter: Payer: Self-pay | Admitting: Internal Medicine

## 2014-06-22 DIAGNOSIS — H4011X1 Primary open-angle glaucoma, mild stage: Secondary | ICD-10-CM | POA: Diagnosis not present

## 2014-06-26 ENCOUNTER — Encounter: Payer: Self-pay | Admitting: Family Medicine

## 2014-06-26 ENCOUNTER — Ambulatory Visit (INDEPENDENT_AMBULATORY_CARE_PROVIDER_SITE_OTHER): Payer: Medicare Other | Admitting: Family Medicine

## 2014-06-26 VITALS — BP 130/88 | HR 44 | Wt 161.2 lb

## 2014-06-26 DIAGNOSIS — Z720 Tobacco use: Secondary | ICD-10-CM | POA: Diagnosis not present

## 2014-06-26 DIAGNOSIS — E119 Type 2 diabetes mellitus without complications: Secondary | ICD-10-CM | POA: Diagnosis not present

## 2014-06-26 DIAGNOSIS — F17200 Nicotine dependence, unspecified, uncomplicated: Secondary | ICD-10-CM

## 2014-06-26 DIAGNOSIS — E1169 Type 2 diabetes mellitus with other specified complication: Secondary | ICD-10-CM

## 2014-06-26 DIAGNOSIS — R001 Bradycardia, unspecified: Secondary | ICD-10-CM

## 2014-06-26 DIAGNOSIS — E785 Hyperlipidemia, unspecified: Secondary | ICD-10-CM | POA: Diagnosis not present

## 2014-06-26 DIAGNOSIS — I10 Essential (primary) hypertension: Secondary | ICD-10-CM | POA: Diagnosis not present

## 2014-06-26 DIAGNOSIS — E1159 Type 2 diabetes mellitus with other circulatory complications: Secondary | ICD-10-CM

## 2014-06-26 DIAGNOSIS — E1136 Type 2 diabetes mellitus with diabetic cataract: Secondary | ICD-10-CM | POA: Diagnosis not present

## 2014-06-26 DIAGNOSIS — J438 Other emphysema: Secondary | ICD-10-CM

## 2014-06-26 DIAGNOSIS — Z9849 Cataract extraction status, unspecified eye: Secondary | ICD-10-CM | POA: Insufficient documentation

## 2014-06-26 LAB — CBC WITH DIFFERENTIAL/PLATELET
BASOS ABS: 0 10*3/uL (ref 0.0–0.1)
Basophils Relative: 0 % (ref 0–1)
Eosinophils Absolute: 0.3 10*3/uL (ref 0.0–0.7)
Eosinophils Relative: 4 % (ref 0–5)
HCT: 47.8 % (ref 39.0–52.0)
Hemoglobin: 15.8 g/dL (ref 13.0–17.0)
LYMPHS ABS: 1.3 10*3/uL (ref 0.7–4.0)
Lymphocytes Relative: 16 % (ref 12–46)
MCH: 29.5 pg (ref 26.0–34.0)
MCHC: 33.1 g/dL (ref 30.0–36.0)
MCV: 89.2 fL (ref 78.0–100.0)
MPV: 11 fL (ref 8.6–12.4)
Monocytes Absolute: 0.7 10*3/uL (ref 0.1–1.0)
Monocytes Relative: 9 % (ref 3–12)
Neutro Abs: 5.9 10*3/uL (ref 1.7–7.7)
Neutrophils Relative %: 71 % (ref 43–77)
PLATELETS: 126 10*3/uL — AB (ref 150–400)
RBC: 5.36 MIL/uL (ref 4.22–5.81)
RDW: 15.5 % (ref 11.5–15.5)
WBC: 8.3 10*3/uL (ref 4.0–10.5)

## 2014-06-26 LAB — COMPREHENSIVE METABOLIC PANEL
ALT: 13 U/L (ref 0–53)
AST: 12 U/L (ref 0–37)
Albumin: 4 g/dL (ref 3.5–5.2)
Alkaline Phosphatase: 68 U/L (ref 39–117)
BUN: 21 mg/dL (ref 6–23)
CO2: 26 mEq/L (ref 19–32)
Calcium: 8.9 mg/dL (ref 8.4–10.5)
Chloride: 104 mEq/L (ref 96–112)
Creat: 1.31 mg/dL (ref 0.50–1.35)
GLUCOSE: 107 mg/dL — AB (ref 70–99)
Potassium: 3.8 mEq/L (ref 3.5–5.3)
Sodium: 139 mEq/L (ref 135–145)
Total Bilirubin: 0.6 mg/dL (ref 0.2–1.2)
Total Protein: 6.8 g/dL (ref 6.0–8.3)

## 2014-06-26 LAB — LIPID PANEL
CHOL/HDL RATIO: 3.9 ratio
Cholesterol: 108 mg/dL (ref 0–200)
HDL: 28 mg/dL — ABNORMAL LOW (ref >=40)
LDL CALC: 50 mg/dL (ref 0–99)
Triglycerides: 151 mg/dL — ABNORMAL HIGH (ref <150)
VLDL: 30 mg/dL (ref 0–40)

## 2014-06-26 LAB — POCT GLYCOSYLATED HEMOGLOBIN (HGB A1C)

## 2014-06-26 NOTE — Progress Notes (Signed)
   Subjective:    Patient ID: Donald Richardson, male    DOB: 1940-07-14, 74 y.o.   MRN: [id number]  HPI He is here for an interval evaluation. He continues on medications listed in the chart. He intermittently checks his blood sugars. He continues to smoke and again is not interested in quitting. His diet is unchangedHe has had no difficulty with chest pain, shortness of breath, PND, DOE. He's had no fainting spells. He has seen his cardiologist recently. He does intermittently check his feet.He recently saw his ophthalmologist and does have a cataract however surgery is not planned in the near future.   Review of Systems     Objective:   Physical Exam Alert and in no distress. Cardiac exam does show bradycardia. Heart sounds were quite distant. Lungs are clear to auscultation. Foot exam was normal.  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

## 2014-07-10 ENCOUNTER — Encounter: Payer: Self-pay | Admitting: Internal Medicine

## 2014-07-10 ENCOUNTER — Ambulatory Visit (INDEPENDENT_AMBULATORY_CARE_PROVIDER_SITE_OTHER): Payer: Medicare Other | Admitting: Internal Medicine

## 2014-07-10 VITALS — BP 130/40 | HR 50 | Ht 68.0 in | Wt 159.4 lb

## 2014-07-10 DIAGNOSIS — I2699 Other pulmonary embolism without acute cor pulmonale: Secondary | ICD-10-CM

## 2014-07-10 DIAGNOSIS — R001 Bradycardia, unspecified: Secondary | ICD-10-CM

## 2014-07-10 DIAGNOSIS — I270 Primary pulmonary hypertension: Secondary | ICD-10-CM

## 2014-07-10 NOTE — Progress Notes (Addendum)
Cardiology Office Note   Date:  07/10/2014   ID:  Donald Richardson, DOB 09-20-1940, MRN [id number]  PCP:  Wyatt Haste, MD  Cardiologist:   Dorris Carnes, MD   No chief complaint on file.     History of Present Illness: Donald Richardson is a 74 y.o. male with a history of CAD. 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  He presented with [place] fall 2011. He r/o for MI He underwent cardiac cath that showe: 60% prox LAD; 65% mid LAD lesion); 99% OM2; 90% instent restenosis of the RCA. 80% distal RCA; 60% PLSA. He underwent PTCA/DES to the OM and PTCA/DES x 2 to the RCA.  Since in clinic lasthe denies CP Breathing is stable. No dizziness He continues to smoke about a ppd.     Current Outpatient Prescriptions  Medication Sig Dispense Refill  . aspirin 81 MG tablet Take 81 mg by mouth daily.      . clopidogrel (PLAVIX) 75 MG tablet Take 1 tablet (75 mg total) by mouth daily. 90 tablet 3  . latanoprost (XALATAN) 0.005 % ophthalmic solution Place 1 drop into both eyes at bedtime.    Marland Kitchen lisinopril-hydrochlorothiazide (PRINZIDE,ZESTORETIC) 10-12.5 MG per tablet TAKE 1 TABLET BY MOUTH EVERY DAY. 90 tablet 1  . rosuvastatin (CRESTOR) 20 MG tablet Take 20 mg by mouth daily.    . travoprost, benzalkonium, (TRAVATAN) 0.004 % ophthalmic solution 1 drop at bedtime.     No current facility-administered medications for this visit.    Allergies:   Review of patient's allergies indicates no known allergies.   Past Medical History  Diagnosis Date  . ASHD (arteriosclerotic heart disease)     STENT  . Smoker   . Hemorrhoids   . Diabetes mellitus   . Hyperlipidemia     History reviewed. No pertinent past surgical history.   Social History:  The patient  reports that he has been smoking.  He has never used smokeless tobacco. He reports that he does not drink alcohol or use illicit drugs.   Family History:  The patient's family history includes Breast cancer in his mother; Parkinson's  disease in his father.    ROS:  Please see the history of present illness. All other systems are reviewed and  Negative to the above problem except as noted.    PHYSICAL EXAM: VS:  BP 130/40 mmHg  Pulse 50  Ht 5\' 8"  (1.727 m)  Wt 159 lb 6.4 oz (72.303 kg)  BMI 24.24 kg/m2  GEN: Well nourished, well developed, in no acute distress HEENT: normal Neck: no JVD, carotid bruits, or masses Cardiac: RRR; no murmurs, rubs, or gallops,no edema  Respiratory:  clear to auscultation bilaterally, normal work of breathing GI: soft, nontender, nondistended, + BS  No hepatomegaly  MS: no deformity Moving all extremities   Skin: warm and dry, no rash Neuro:  Strength and sensation are intact Psych: euthymic mood, full affect   EKG:  EKG is ordered today.  SB 50 bpm  Occaional PVC  IWMI  Lipid Panel    Component Value Date/Time   CHOL 108 06/26/2014 0001   TRIG 151* 06/26/2014 0001   HDL 28* 06/26/2014 0001   CHOLHDL 3.9 06/26/2014 0001   VLDL 30 06/26/2014 0001   LDLCALC 50 06/26/2014 0001   LDLDIRECT 99.1 09/18/2008 0000      Wt Readings from Last 3 Encounters:  07/10/14 159 lb 6.4 oz (72.303 kg)  06/26/14 161 lb  3.2 oz (73.12 kg)  02/27/14 164 lb (74.39 kg)      ASSESSMENT AND PLAN:  1  CAD No symptoms to suggest angina    2  Bradycardia  Asymptomatic    No dizziness  No SOB    3.  HTN  Adequate control  4  HL  Lipids are good  5.  Tobacco  Counselled on cessation  He has quit in the past   6.  PVOD  Patinet will need abdominal USN to evaluate iliac arteries  He says that he had carotid USN in past  Will need to verify   Disposition:   FU with me in 1 year or sooner if problems develop  Signed, Dorris Carnes, MD  07/10/2014 10:49 AM    [facility], [place], [place]  [address] Phone: [phone]; Fax: [phone]

## 2014-07-10 NOTE — Patient Instructions (Addendum)
Medication Instructions:      Labwork:   Testing/Procedures: Your physician has requested that you have an abdominal aorta duplex. During this test, an ultrasound is used to evaluate the aorta. Allow 30 minutes for this exam. Do not eat after midnight the day before and avoid carbonated beverages--PLEASE SCHEDULE FOR IN September.   Follow-Up: Your physician wants you to follow-up in: [place].  You will receive a reminder letter in the mail two months in advance. If you don't receive a letter, please call our office to schedule the follow-up appointment.   Any Other Special Instructions Will Be Listed Below (If Applicable).

## 2014-08-06 ENCOUNTER — Other Ambulatory Visit: Payer: Self-pay | Admitting: Internal Medicine

## 2014-08-16 LAB — HM DIABETES EYE EXAM

## 2014-09-09 ENCOUNTER — Other Ambulatory Visit: Payer: Self-pay | Admitting: Family Medicine

## 2014-09-28 DIAGNOSIS — H4011X1 Primary open-angle glaucoma, mild stage: Secondary | ICD-10-CM | POA: Diagnosis not present

## 2014-10-12 ENCOUNTER — Other Ambulatory Visit: Payer: Self-pay | Admitting: Internal Medicine

## 2014-10-17 ENCOUNTER — Other Ambulatory Visit: Payer: Self-pay | Admitting: Family Medicine

## 2014-10-26 ENCOUNTER — Ambulatory Visit (INDEPENDENT_AMBULATORY_CARE_PROVIDER_SITE_OTHER): Payer: Medicare Other | Admitting: Family Medicine

## 2014-10-26 ENCOUNTER — Encounter: Payer: Self-pay | Admitting: Family Medicine

## 2014-10-26 VITALS — BP 118/70 | HR 50 | Ht 68.0 in | Wt 152.0 lb

## 2014-10-26 DIAGNOSIS — E1136 Type 2 diabetes mellitus with diabetic cataract: Secondary | ICD-10-CM | POA: Diagnosis not present

## 2014-10-26 DIAGNOSIS — R001 Bradycardia, unspecified: Secondary | ICD-10-CM | POA: Diagnosis not present

## 2014-10-26 DIAGNOSIS — I10 Essential (primary) hypertension: Secondary | ICD-10-CM | POA: Diagnosis not present

## 2014-10-26 DIAGNOSIS — I152 Hypertension secondary to endocrine disorders: Secondary | ICD-10-CM

## 2014-10-26 DIAGNOSIS — E1159 Type 2 diabetes mellitus with other circulatory complications: Secondary | ICD-10-CM

## 2014-10-26 DIAGNOSIS — I2510 Atherosclerotic heart disease of native coronary artery without angina pectoris: Secondary | ICD-10-CM

## 2014-10-26 DIAGNOSIS — E1169 Type 2 diabetes mellitus with other specified complication: Secondary | ICD-10-CM | POA: Diagnosis not present

## 2014-10-26 DIAGNOSIS — E785 Hyperlipidemia, unspecified: Secondary | ICD-10-CM | POA: Diagnosis not present

## 2014-10-26 DIAGNOSIS — Z720 Tobacco use: Secondary | ICD-10-CM

## 2014-10-26 DIAGNOSIS — E118 Type 2 diabetes mellitus with unspecified complications: Secondary | ICD-10-CM

## 2014-10-26 DIAGNOSIS — F17200 Nicotine dependence, unspecified, uncomplicated: Secondary | ICD-10-CM

## 2014-10-26 LAB — POCT GLYCOSYLATED HEMOGLOBIN (HGB A1C): Hemoglobin A1C: 5.8

## 2014-10-26 MED ORDER — ROSUVASTATIN CALCIUM 40 MG PO TABS: 40.0000 mg | ORAL_TABLET | Freq: Every day | ORAL | Status: DC

## 2014-10-26 MED ORDER — LISINOPRIL-HYDROCHLOROTHIAZIDE 10-12.5 MG PO TABS: 1.0000 | ORAL_TABLET | Freq: Every day | ORAL | Status: DC

## 2014-10-26 NOTE — Progress Notes (Signed)
  Subjective:    Patient ID: Donald Richardson, male    DOB: [birth year], 75 y.o.   MRN: [id number]  Donald Richardson is a 74 y.o. male who presents for follow-up of Type 2 diabetes mellitus.  Home blood sugar records: patient doesn't check Current symptoms/problems none Daily foot checks:yes   Any foot concerns:none Exercise: walking every day Eye; 08/2014 Dr.Gould He does have cataracts and is being followed every 3 months. The following portions of the patient's history were reviewed and updated as appropriate: allergies, current medications, past medical history, past social history and problem list.  ROS as in subjective above.     Objective:    Physical Exam Alert and in no distress otherwise not examined.   Lab Review Diabetic Labs Latest Ref Rng 06/26/2014 02/27/2014 10/17/2013 06/16/2013 02/15/2013  HbA1c - 6.2% 6.0 6.4 6.7 6.2  Chol 0 - 200 mg/dL 108 - - 114 -  HDL >=40 mg/dL 28(L) - - 32(L) -  Calc LDL 0 - 99 mg/dL 50 - - 56 -  Triglycerides <150 mg/dL 151(H) - - 131 -  Creatinine 0.50 - 1.35 mg/dL 1.31 - - 1.48(H) -  GFR >60.00 mL/min - - - - -   BP/Weight 07/10/2014 06/26/2014 02/27/2014 10/17/2013 0/94/7096  Systolic BP 283 662 947 654 650  Diastolic BP 40 88 70 70 72  Wt. (Lbs) 159.4 161.2 164 163 164  BMI 24.24 24.52 24.94 24.79 24.94   Foot/eye exam completion dates 06/26/2014 06/16/2013  Foot Form Completion Done Done  HbA1c 5.8  Donald Richardson  reports that he has been smoking.  He has never used smokeless tobacco. He reports that he does not drink alcohol or use illicit drugs. He has absolutely no intention on quitting smoking.    Assessment & Plan:    Type 2 diabetes mellitus with complication - Plan: POCT glycosylated hemoglobin (Hb A1C)  Cataract associated with type 2 diabetes mellitus  Hypertension associated with diabetes - Plan: lisinopril-hydrochlorothiazide (PRINZIDE,ZESTORETIC) 10-12.5 MG per tablet  Atherosclerosis of native coronary artery of native heart without  angina pectoris  TOBACCO ABUSE  Hyperlipidemia associated with type 2 diabetes mellitus - Plan: rosuvastatin (CRESTOR) 40 MG tablet  Bradycardia   1. Rx changes: none 2. Education: Reviewed 'ABCs' of diabetes management (respective goals in parentheses):  A1C (<7), blood pressure (<130/80), and cholesterol (LDL <100). 3. Compliance at present is estimated to be fair. Efforts to improve compliance (if necessary) will be directed at no change. 4. Follow up: 4 months

## 2014-11-20 ENCOUNTER — Other Ambulatory Visit (HOSPITAL_COMMUNITY): Payer: No Typology Code available for payment source

## 2014-11-20 ENCOUNTER — Ambulatory Visit (HOSPITAL_COMMUNITY)
Admission: RE | Admit: 2014-11-20 | Discharge: 2014-11-20 | Disposition: A | Discharge status: Home / Self Care | Payer: Medicare Other | Source: Ambulatory Visit | Attending: Cardiovascular Disease | Admitting: Cardiovascular Disease

## 2014-11-20 ENCOUNTER — Other Ambulatory Visit: Payer: Self-pay | Admitting: Internal Medicine

## 2014-11-20 DIAGNOSIS — F17200 Nicotine dependence, unspecified, uncomplicated: Secondary | ICD-10-CM | POA: Diagnosis not present

## 2014-11-20 DIAGNOSIS — I714 Abdominal aortic aneurysm, without rupture, unspecified: Secondary | ICD-10-CM

## 2014-11-20 DIAGNOSIS — I723 Aneurysm of iliac artery: Secondary | ICD-10-CM | POA: Insufficient documentation

## 2014-11-20 DIAGNOSIS — E785 Hyperlipidemia, unspecified: Secondary | ICD-10-CM | POA: Diagnosis not present

## 2014-11-20 DIAGNOSIS — I2699 Other pulmonary embolism without acute cor pulmonale: Secondary | ICD-10-CM | POA: Diagnosis not present

## 2014-11-20 DIAGNOSIS — I10 Essential (primary) hypertension: Secondary | ICD-10-CM | POA: Diagnosis not present

## 2014-11-20 DIAGNOSIS — I2510 Atherosclerotic heart disease of native coronary artery without angina pectoris: Secondary | ICD-10-CM | POA: Diagnosis not present

## 2014-11-20 DIAGNOSIS — E119 Type 2 diabetes mellitus without complications: Secondary | ICD-10-CM | POA: Diagnosis not present

## 2014-11-20 DIAGNOSIS — I270 Primary pulmonary hypertension: Secondary | ICD-10-CM

## 2014-12-28 DIAGNOSIS — H401131 Primary open-angle glaucoma, bilateral, mild stage: Secondary | ICD-10-CM | POA: Diagnosis not present

## 2015-03-06 ENCOUNTER — Ambulatory Visit (INDEPENDENT_AMBULATORY_CARE_PROVIDER_SITE_OTHER): Payer: Medicare Other | Admitting: Family Medicine

## 2015-03-06 ENCOUNTER — Encounter: Payer: Self-pay | Admitting: Family Medicine

## 2015-03-06 VITALS — BP 116/68 | Ht 68.0 in | Wt 153.0 lb

## 2015-03-06 DIAGNOSIS — F17200 Nicotine dependence, unspecified, uncomplicated: Secondary | ICD-10-CM | POA: Diagnosis not present

## 2015-03-06 DIAGNOSIS — E118 Type 2 diabetes mellitus with unspecified complications: Secondary | ICD-10-CM

## 2015-03-06 DIAGNOSIS — E1159 Type 2 diabetes mellitus with other circulatory complications: Secondary | ICD-10-CM | POA: Diagnosis not present

## 2015-03-06 DIAGNOSIS — I10 Essential (primary) hypertension: Secondary | ICD-10-CM | POA: Diagnosis not present

## 2015-03-06 DIAGNOSIS — I2510 Atherosclerotic heart disease of native coronary artery without angina pectoris: Secondary | ICD-10-CM | POA: Diagnosis not present

## 2015-03-06 DIAGNOSIS — E1169 Type 2 diabetes mellitus with other specified complication: Secondary | ICD-10-CM

## 2015-03-06 DIAGNOSIS — E785 Hyperlipidemia, unspecified: Secondary | ICD-10-CM

## 2015-03-06 DIAGNOSIS — I152 Hypertension secondary to endocrine disorders: Secondary | ICD-10-CM

## 2015-03-06 DIAGNOSIS — L0231 Cutaneous abscess of buttock: Secondary | ICD-10-CM

## 2015-03-06 NOTE — Progress Notes (Signed)
  Subjective:    Patient ID: Donald Richardson, male    DOB: 30-Nov-1940, 75 y.o.   MRN: [id number]  Donald Richardson is a 75 y.o. male who presents for follow-up of Type 2 diabetes mellitus.  Patient is not checking home blood sugars.   Home blood sugar records: NOT TAKEN THIS AM. He rarely checks his blood sugars How often is blood sugars being checked: Rare  Current symptoms/problems include none and have been stable. Daily foot checks:    Any foot concerns: NONE Last eye exam: Scheduled for this month Exercise: WALKING A LITTLE He does complain of having 4 episodes of swelling and bleeding from a lesion on his left buttock. The following portions of the patient's history were reviewed and updated as appropriate: allergies, current medications, past medical history, past social history and problem list.  ROS as in subjective above.     Objective:    Physical Exam Alert and in no distress ; exam of the left buttock at the crease does show bloody drainage that is not indurated but slightly tender to palpation.  Blood pressure 116/68, height 5\' 8"  (1.727 m), weight 153 lb (69.4 kg).  Lab Review Diabetic Labs Latest Ref Rng 10/26/2014 06/26/2014 02/27/2014 10/17/2013 06/16/2013  HbA1c - 5.8 6.2% 6.0 6.4 6.7  Chol 0 - 200 mg/dL - 108 - - 114  HDL >=40 mg/dL - 28(L) - - 32(L)  Calc LDL 0 - 99 mg/dL - 50 - - 56  Triglycerides <150 mg/dL - 151(H) - - 131  Creatinine 0.50 - 1.35 mg/dL - 1.31 - - 1.48(H)  GFR >60.00 mL/min - - - - -   BP/Weight 03/06/2015 10/26/2014 07/10/2014 06/26/2014 [id number]  Systolic BP [id number]  Diastolic BP 68 70 40 88 70  Wt. (Lbs) 153 152 159.4 161.2 164  BMI 23.27 23.12 24.24 24.52 24.94   Foot/eye exam completion dates Latest Ref Rng 08/16/2014 06/26/2014  Eye Exam No Retinopathy No Retinopathy -  Foot Form Completion - - Done   Bone A1c is 5.8 Donald Richardson  reports that he has been smoking.  He has never used smokeless tobacco. He reports that he does not drink  alcohol or use illicit drugs.     Assessment & Plan:    Controlled type 2 diabetes mellitus with complication, without long-term current use of insulin (HCC)  TOBACCO ABUSE  Hypertension associated with diabetes ([place])  Hyperlipidemia associated with type 2 diabetes mellitus ([place])  Atherosclerosis of native coronary artery of native heart without angina pectoris  Abscess, gluteal, left   1. Rx changes: none 2. Education: Reviewed 'ABCs' of diabetes management (respective goals in parentheses):  A1C (<7), blood pressure (<130/80), and cholesterol (LDL <100). 3. Compliance at present is estimated to be good. Efforts to improve compliance (if necessary) will be directed at Nothing. 4. Follow up: 4 months He is to use warm soaks on the lesion on his buttock and return here in approximately 2 weeks for recheck. At this point it does appear to be slowly healing with no evidence of induration. Hopefully in 2 weeks we will be able to something more definitive.

## 2015-03-06 NOTE — Patient Instructions (Signed)
Irrigate that area in the shower a couple times a day use a hot wet towel. Use peroxide on your clothes. Back here in two weeks.

## 2015-03-20 ENCOUNTER — Encounter: Payer: Self-pay | Admitting: Family Medicine

## 2015-03-20 ENCOUNTER — Ambulatory Visit (INDEPENDENT_AMBULATORY_CARE_PROVIDER_SITE_OTHER): Payer: Medicare Other | Admitting: Family Medicine

## 2015-03-20 VITALS — BP 112/68 | HR 64 | Temp 97.8°F | Ht 68.0 in | Wt 155.0 lb

## 2015-03-20 DIAGNOSIS — L0231 Cutaneous abscess of buttock: Secondary | ICD-10-CM

## 2015-03-20 DIAGNOSIS — I2510 Atherosclerotic heart disease of native coronary artery without angina pectoris: Secondary | ICD-10-CM

## 2015-03-20 NOTE — Progress Notes (Signed)
   Subjective:    Patient ID: Donald Richardson, male    DOB: [birth year], 75 y.o.   MRN: [id number]  HPI He is here for a recheck. He states that the drainage has stopped and doing much better.   Review of Systems     Objective:   Physical Exam Alert and in no distress. Exam of the area on the left buttock at the crease shows no induration and a small healing lesion.       Assessment & Plan:  Abscess, gluteal, left Command he continue to use heat on this and if this reoccurs, come back sooner.

## 2015-03-29 DIAGNOSIS — H401131 Primary open-angle glaucoma, bilateral, mild stage: Secondary | ICD-10-CM | POA: Diagnosis not present

## 2015-04-13 ENCOUNTER — Other Ambulatory Visit: Payer: Self-pay | Admitting: *Deleted

## 2015-04-13 DIAGNOSIS — E785 Hyperlipidemia, unspecified: Principal | ICD-10-CM

## 2015-04-13 DIAGNOSIS — E1169 Type 2 diabetes mellitus with other specified complication: Secondary | ICD-10-CM

## 2015-04-13 MED ORDER — ROSUVASTATIN CALCIUM 40 MG PO TABS: 40.0000 mg | ORAL_TABLET | Freq: Every day | ORAL | Status: DC

## 2015-05-11 ENCOUNTER — Encounter: Payer: Self-pay | Admitting: *Deleted

## 2015-06-28 DIAGNOSIS — H401131 Primary open-angle glaucoma, bilateral, mild stage: Secondary | ICD-10-CM | POA: Diagnosis not present

## 2015-06-28 DIAGNOSIS — H2513 Age-related nuclear cataract, bilateral: Secondary | ICD-10-CM | POA: Diagnosis not present

## 2015-07-13 DIAGNOSIS — H2513 Age-related nuclear cataract, bilateral: Secondary | ICD-10-CM | POA: Diagnosis not present

## 2015-07-13 DIAGNOSIS — Z0100 Encounter for examination of eyes and vision without abnormal findings: Secondary | ICD-10-CM | POA: Diagnosis not present

## 2015-07-16 ENCOUNTER — Telehealth: Payer: Self-pay | Admitting: *Deleted

## 2015-07-16 ENCOUNTER — Other Ambulatory Visit: Payer: Medicare Other

## 2015-07-16 DIAGNOSIS — E785 Hyperlipidemia, unspecified: Secondary | ICD-10-CM

## 2015-07-16 DIAGNOSIS — I10 Essential (primary) hypertension: Secondary | ICD-10-CM

## 2015-07-16 DIAGNOSIS — I152 Hypertension secondary to endocrine disorders: Secondary | ICD-10-CM

## 2015-07-16 DIAGNOSIS — R001 Bradycardia, unspecified: Secondary | ICD-10-CM

## 2015-07-16 DIAGNOSIS — E1169 Type 2 diabetes mellitus with other specified complication: Secondary | ICD-10-CM

## 2015-07-16 DIAGNOSIS — E1159 Type 2 diabetes mellitus with other circulatory complications: Secondary | ICD-10-CM

## 2015-07-16 NOTE — Telephone Encounter (Signed)
Message     Pt is coming in Monday afternoon Needs CBC, BMET, TSH and lipid panel    I will try to see him as add on.       Lab orders placed.  Appointment made.

## 2015-07-19 ENCOUNTER — Ambulatory Visit (INDEPENDENT_AMBULATORY_CARE_PROVIDER_SITE_OTHER): Payer: Medicare Other | Admitting: Internal Medicine

## 2015-07-19 ENCOUNTER — Other Ambulatory Visit: Payer: Self-pay | Admitting: *Deleted

## 2015-07-19 ENCOUNTER — Other Ambulatory Visit (INDEPENDENT_AMBULATORY_CARE_PROVIDER_SITE_OTHER): Payer: Medicare Other | Admitting: *Deleted

## 2015-07-19 ENCOUNTER — Encounter: Payer: Self-pay | Admitting: Internal Medicine

## 2015-07-19 ENCOUNTER — Telehealth: Payer: Self-pay | Admitting: *Deleted

## 2015-07-19 VITALS — BP 122/68 | HR 42 | Ht 68.0 in | Wt 151.1 lb

## 2015-07-19 DIAGNOSIS — E1169 Type 2 diabetes mellitus with other specified complication: Secondary | ICD-10-CM | POA: Diagnosis not present

## 2015-07-19 DIAGNOSIS — E785 Hyperlipidemia, unspecified: Secondary | ICD-10-CM | POA: Diagnosis not present

## 2015-07-19 DIAGNOSIS — I152 Hypertension secondary to endocrine disorders: Secondary | ICD-10-CM

## 2015-07-19 DIAGNOSIS — I10 Essential (primary) hypertension: Secondary | ICD-10-CM

## 2015-07-19 DIAGNOSIS — R7309 Other abnormal glucose: Secondary | ICD-10-CM

## 2015-07-19 DIAGNOSIS — R001 Bradycardia, unspecified: Secondary | ICD-10-CM

## 2015-07-19 DIAGNOSIS — E1159 Type 2 diabetes mellitus with other circulatory complications: Secondary | ICD-10-CM

## 2015-07-19 DIAGNOSIS — I2510 Atherosclerotic heart disease of native coronary artery without angina pectoris: Secondary | ICD-10-CM | POA: Diagnosis not present

## 2015-07-19 DIAGNOSIS — I498 Other specified cardiac arrhythmias: Secondary | ICD-10-CM | POA: Diagnosis not present

## 2015-07-19 LAB — CBC
HEMATOCRIT: 47.2 % (ref 38.5–50.0)
Hemoglobin: 15.6 g/dL (ref 13.2–17.1)
MCH: 30.5 pg (ref 27.0–33.0)
MCHC: 33.1 g/dL (ref 32.0–36.0)
MCV: 92.4 fL (ref 80.0–100.0)
MPV: 11 fL (ref 7.5–12.5)
PLATELETS: 138 10*3/uL — AB (ref 140–400)
RBC: 5.11 MIL/uL (ref 4.20–5.80)
RDW: 14.4 % (ref 11.0–15.0)
WBC: 7.8 10*3/uL (ref 3.8–10.8)

## 2015-07-19 LAB — BASIC METABOLIC PANEL
BUN: 28 mg/dL — ABNORMAL HIGH (ref 7–25)
CHLORIDE: 102 mmol/L (ref 98–110)
CO2: 27 mmol/L (ref 20–31)
Calcium: 8.9 mg/dL (ref 8.6–10.3)
Creat: 1.82 mg/dL — ABNORMAL HIGH (ref 0.70–1.18)
Glucose, Bld: 104 mg/dL — ABNORMAL HIGH (ref 65–99)
POTASSIUM: 3.9 mmol/L (ref 3.5–5.3)
SODIUM: 140 mmol/L (ref 135–146)

## 2015-07-19 LAB — LIPID PANEL
CHOL/HDL RATIO: 3.4 ratio (ref <=5.0)
Cholesterol: 112 mg/dL — ABNORMAL LOW (ref 125–200)
HDL: 33 mg/dL — AB (ref >=40)
LDL Cholesterol: 59 mg/dL (ref <130)
Triglycerides: 98 mg/dL (ref <150)
VLDL: 20 mg/dL (ref <30)

## 2015-07-19 LAB — HEMOGLOBIN A1C
HEMOGLOBIN A1C: 6.3 % — AB (ref <5.7)
MEAN PLASMA GLUCOSE: 134 mg/dL

## 2015-07-19 LAB — TSH: TSH: 1.16 mIU/L (ref 0.40–4.50)

## 2015-07-19 MED ORDER — LISINOPRIL-HYDROCHLOROTHIAZIDE 10-12.5 MG PO TABS: 0.5000 | ORAL_TABLET | Freq: Every day | ORAL | Status: DC

## 2015-07-19 NOTE — Telephone Encounter (Signed)
-----   Message from Dorris Carnes V, MD sent at 07/19/2015  4:45 PM EDT ----- Pt contacted   CBC ok Thyroid function is normal Lipids are very good A1C is a little high  Watch carbs Cr is up at 1.83  Recomm he cut Prinizide in 1//2    He should have BMET checked again in 1 wk  Encouraged him to stay hydrated  Need to set up time

## 2015-07-19 NOTE — Telephone Encounter (Signed)
Medicine list updated.  Message sent to scheduler to request patient be called to set up lab appointment for next Friday. 5/26.

## 2015-07-19 NOTE — Patient Instructions (Signed)
Your physician recommends that you continue on your current medications as directed. Please refer to the Current Medication list given to you today.     

## 2015-07-19 NOTE — Progress Notes (Signed)
Cardiology Office Note   Date:  07/19/2015   ID:  Donald Richardson, DOB [birth year], MRN [id number]  PCP:  Wyatt Haste, MD  Cardiologist:   Dorris Carnes, MD   Pt presents for f/u of CAD      History of Present Illness: Donald Richardson is a 75 y.o. male with a history ofCAD. 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  He presented with [place] fall 2011. He r/o for MI He underwent cardiac cath that showe: 60% prox LAD; 65% mid LAD lesion); 99% OM2; 90% instent restenosis of the RCA. 80% distal RCA; 60% PLSA. He underwent PTCA/DES to the OM and PTCA/DES x 2 to the RCA.  I saw him 1 year ago  SInce then he has done OK from a cardiac standpoint  NO CP  Breathing is stable  Continues to smoke 1 ppd        Outpatient Prescriptions Prior to Visit  Medication Sig Dispense Refill  . aspirin 81 MG tablet Take 81 mg by mouth daily.      . clopidogrel (PLAVIX) 75 MG tablet TAKE 1 TABLET BY MOUTH DAILY 90 tablet 3  . latanoprost (XALATAN) 0.005 % ophthalmic solution Place 1 drop into both eyes at bedtime.    Marland Kitchen lisinopril-hydrochlorothiazide (PRINZIDE,ZESTORETIC) 10-12.5 MG per tablet Take 1 tablet by mouth daily. 90 tablet 3  . rosuvastatin (CRESTOR) 40 MG tablet Take 1 tablet (40 mg total) by mouth daily. 90 tablet 1   No facility-administered medications prior to visit.     Allergies:   Review of patient's allergies indicates no known allergies.   Past Medical History  Diagnosis Date  . ASHD (arteriosclerotic heart disease)     STENT  . Smoker   . Hemorrhoids   . Diabetes mellitus   . Hyperlipidemia     No past surgical history on file.   Social History:  The patient  reports that he has been smoking.  He has never used smokeless tobacco. He reports that he does not drink alcohol or use illicit drugs.   Family History:  The patient's family history includes Breast cancer in his mother; Parkinson's disease in his father.    ROS:  Please see the history of present illness.  All other systems are reviewed and  Negative to the above problem except as noted.    PHYSICAL EXAM: VS:  BP 122/68 mmHg  Pulse 42  Ht 5\' 8"  (1.727 m)  Wt 151 lb 1.9 oz (68.548 kg)  BMI 22.98 kg/m2  GEN: Well nourished, well developed, in no acute distress HEENT: normal Neck: no JVD, carotid bruits, or masses Cardiac: RRR; no murmurs, rubs, or gallops,no edema  Respiratory:  clear to auscultation bilaterally, normal work of breathing GI: soft, nontender, nondistended, + BS  No hepatomegaly  MS: no deformity Moving all extremities   Skin: warm and dry, no rash Neuro:  Strength and sensation are intact Psych: euthymic mood, full affect   EKG:  EKG is ordered today.  SB  42 bpm  IWMI  RBBB   Lipid Panel    Component Value Date/Time   CHOL 108 06/26/2014 0001   TRIG 151* 06/26/2014 0001   HDL 28* 06/26/2014 0001   CHOLHDL 3.9 06/26/2014 0001   VLDL 30 06/26/2014 0001   LDLCALC 50 06/26/2014 0001   LDLDIRECT 99.1 09/18/2008 0000      Wt Readings from Last 3 Encounters:  07/19/15 151 lb 1.9 oz (68.548 kg)  03/20/15 155 lb (70.308 kg)  03/06/15 153 lb (69.4 kg)      ASSESSMENT AND PLAN:  1  CAD  No symptoms to suggest angina  Check labs   Continue meds  From cardiac standpoint he is OK to proceed with eye surgery and possible hernia surgery  2.  HL  Check lpids   3.  HTN  BP is OK    4  Bradycardia  Does not appear to be symptomatic  5  PVOD  WIll need USN in fall  6  DM  WIll check Hgb A1C  7  Tob use  Continues to smoke 1ppd  Encouraged to quit   Signed, Dorris Carnes, MD  07/19/2015 11:00 AM    [facility] [place], [place], [place]  [address] Phone: [phone]; Fax: [phone]

## 2015-07-27 ENCOUNTER — Other Ambulatory Visit (INDEPENDENT_AMBULATORY_CARE_PROVIDER_SITE_OTHER): Payer: Medicare Other | Admitting: *Deleted

## 2015-07-27 DIAGNOSIS — E1159 Type 2 diabetes mellitus with other circulatory complications: Secondary | ICD-10-CM

## 2015-07-27 DIAGNOSIS — I10 Essential (primary) hypertension: Secondary | ICD-10-CM | POA: Diagnosis not present

## 2015-07-27 DIAGNOSIS — I152 Hypertension secondary to endocrine disorders: Secondary | ICD-10-CM

## 2015-07-27 LAB — BASIC METABOLIC PANEL
BUN: 21 mg/dL (ref 7–25)
CALCIUM: 8.9 mg/dL (ref 8.6–10.3)
CHLORIDE: 106 mmol/L (ref 98–110)
CO2: 27 mmol/L (ref 20–31)
Creat: 1.62 mg/dL — ABNORMAL HIGH (ref 0.70–1.18)
Glucose, Bld: 104 mg/dL — ABNORMAL HIGH (ref 65–99)
POTASSIUM: 3.9 mmol/L (ref 3.5–5.3)
SODIUM: 142 mmol/L (ref 135–146)

## 2015-07-27 NOTE — Addendum Note (Signed)
Addended by: Eulis Foster on: 07/27/2015 09:13 AM   Modules accepted: Orders

## 2015-07-31 DIAGNOSIS — H25811 Combined forms of age-related cataract, right eye: Secondary | ICD-10-CM | POA: Diagnosis not present

## 2015-07-31 DIAGNOSIS — H21561 Pupillary abnormality, right eye: Secondary | ICD-10-CM | POA: Diagnosis not present

## 2015-07-31 DIAGNOSIS — H2511 Age-related nuclear cataract, right eye: Secondary | ICD-10-CM | POA: Diagnosis not present

## 2015-08-08 ENCOUNTER — Telehealth: Payer: Self-pay | Admitting: *Deleted

## 2015-08-08 ENCOUNTER — Other Ambulatory Visit: Payer: Self-pay | Admitting: Internal Medicine

## 2015-08-08 DIAGNOSIS — I10 Essential (primary) hypertension: Secondary | ICD-10-CM

## 2015-08-08 MED ORDER — LISINOPRIL 10 MG PO TABS: 10.0000 mg | ORAL_TABLET | Freq: Every day | ORAL | Status: DC

## 2015-08-08 NOTE — Telephone Encounter (Signed)
Called patient and advised. He verbalizes understanding and agreement. Will stop lisinopril/hctz and pick up new prescription today Lab appointment made for 6/28.  (3 weeks)

## 2015-08-08 NOTE — Telephone Encounter (Signed)
-----   Message from Dorris Carnes V, MD sent at 08/02/2015  4:58 PM EDT ----- Potassium is OK Kidney function is relatively stable but I would recomm trying lisinoprol 10 mg  Daily  (No HCTZ)  F/U BMET in 3 wks.

## 2015-08-14 DIAGNOSIS — H25812 Combined forms of age-related cataract, left eye: Secondary | ICD-10-CM | POA: Diagnosis not present

## 2015-08-14 DIAGNOSIS — H2512 Age-related nuclear cataract, left eye: Secondary | ICD-10-CM | POA: Diagnosis not present

## 2015-08-17 LAB — HM DIABETES EYE EXAM

## 2015-08-29 ENCOUNTER — Other Ambulatory Visit (INDEPENDENT_AMBULATORY_CARE_PROVIDER_SITE_OTHER): Payer: Medicare Other | Admitting: *Deleted

## 2015-08-29 DIAGNOSIS — I10 Essential (primary) hypertension: Secondary | ICD-10-CM

## 2015-08-29 LAB — BASIC METABOLIC PANEL
BUN: 16 mg/dL (ref 7–25)
CALCIUM: 8.9 mg/dL (ref 8.6–10.3)
CO2: 26 mmol/L (ref 20–31)
Chloride: 104 mmol/L (ref 98–110)
Creat: 1.47 mg/dL — ABNORMAL HIGH (ref 0.70–1.18)
Glucose, Bld: 90 mg/dL (ref 65–99)
Potassium: 3.8 mmol/L (ref 3.5–5.3)
SODIUM: 138 mmol/L (ref 135–146)

## 2015-09-10 ENCOUNTER — Other Ambulatory Visit: Payer: Self-pay | Admitting: *Deleted

## 2015-09-13 ENCOUNTER — Other Ambulatory Visit: Payer: Self-pay | Admitting: *Deleted

## 2015-09-13 ENCOUNTER — Telehealth: Payer: Self-pay | Admitting: *Deleted

## 2015-09-13 DIAGNOSIS — K4090 Unilateral inguinal hernia, without obstruction or gangrene, not specified as recurrent: Secondary | ICD-10-CM

## 2015-09-13 NOTE — Telephone Encounter (Signed)
Please fax note with referral to Dr Brien Mates [facility]) for hernia repair    From me    Fax number is [phone]      Above message received from Dr. Harrington Challenger. Order for referral has been placed for inguinal hernia repair (clarified with MD) Routed last OV note to Dr. Zella Richer.

## 2015-09-18 ENCOUNTER — Ambulatory Visit (INDEPENDENT_AMBULATORY_CARE_PROVIDER_SITE_OTHER): Payer: Medicare Other | Admitting: Family Medicine

## 2015-09-18 ENCOUNTER — Encounter: Payer: Self-pay | Admitting: Family Medicine

## 2015-09-18 VITALS — BP 122/68 | HR 52 | Ht 68.0 in | Wt 156.4 lb

## 2015-09-18 DIAGNOSIS — E118 Type 2 diabetes mellitus with unspecified complications: Secondary | ICD-10-CM | POA: Diagnosis not present

## 2015-09-18 DIAGNOSIS — H109 Unspecified conjunctivitis: Secondary | ICD-10-CM | POA: Diagnosis not present

## 2015-09-18 DIAGNOSIS — E1169 Type 2 diabetes mellitus with other specified complication: Secondary | ICD-10-CM

## 2015-09-18 DIAGNOSIS — J438 Other emphysema: Secondary | ICD-10-CM

## 2015-09-18 DIAGNOSIS — I152 Hypertension secondary to endocrine disorders: Secondary | ICD-10-CM

## 2015-09-18 DIAGNOSIS — I10 Essential (primary) hypertension: Secondary | ICD-10-CM

## 2015-09-18 DIAGNOSIS — K4090 Unilateral inguinal hernia, without obstruction or gangrene, not specified as recurrent: Secondary | ICD-10-CM

## 2015-09-18 DIAGNOSIS — E785 Hyperlipidemia, unspecified: Secondary | ICD-10-CM

## 2015-09-18 DIAGNOSIS — E1159 Type 2 diabetes mellitus with other circulatory complications: Secondary | ICD-10-CM | POA: Diagnosis not present

## 2015-09-18 DIAGNOSIS — F17200 Nicotine dependence, unspecified, uncomplicated: Secondary | ICD-10-CM | POA: Diagnosis not present

## 2015-09-18 DIAGNOSIS — I2510 Atherosclerotic heart disease of native coronary artery without angina pectoris: Secondary | ICD-10-CM

## 2015-09-18 LAB — POCT GLYCOSYLATED HEMOGLOBIN (HGB A1C): Hemoglobin A1C: 5.8

## 2015-09-18 MED ORDER — ERYTHROMYCIN 5 MG/GM OP OINT: 1.0000 "application " | TOPICAL_OINTMENT | Freq: Three times a day (TID) | OPHTHALMIC | Status: DC

## 2015-09-18 NOTE — Progress Notes (Signed)
  Subjective:    Patient ID: Donald Richardson, male    DOB: 10-18-1940, 75 y.o.   MRN: [id number]  Donald Richardson is a 75 y.o. male who presents for follow-up of Type 2 diabetes mellitus.  Patient is not checking home blood sugars.   Home blood sugar records n/a How often is blood sugars being checked: na Current symptoms/problems None Daily foot checks: yes   Any foot concerns: none Last eye exam: 6/17 Exercise: walking. He was recently diagnosed with a right inguinal hernia and is being referred to surgery for repair of this.  He also has underlying COPD and continues to smoke. He has no plans to quit smoking. He has had recent difficulty with his left eye is of a foreign body was and it and since then has noted redness in the eye. He states that this is slowly getting better. Several months ago he did have bilateral cataract surgery. He continues on his blood pressure as well as lipids medications.  The following portions of the patient's history were reviewed and updated as appropriate: allergies, current medications, past medical history, past social history and problem list.  ROS as in subjective above.     Objective:    Physical Exam Alert and in no distress Left conjunctiva is quite erythematous but no drainage noted.  Weight 156 lb 6.4 oz (70.943 kg).  Lab Review Diabetic Labs Latest Ref Rng 08/29/2015 07/27/2015 07/19/2015 10/26/2014 06/26/2014  HbA1c <5.7 % - - 6.3(H) 5.8 6.2%  Chol 125 - 200 mg/dL - - 112(L) - 108  HDL >=40 mg/dL - - 33(L) - 28(L)  Calc LDL <130 mg/dL - - 59 - 50  Triglycerides <150 mg/dL - - 98 - 151(H)  Creatinine 0.70 - 1.18 mg/dL 1.47(H) 1.62(H) 1.82(H) - 1.31  GFR >60.00 mL/min - - - - -   BP/Weight 09/18/2015 07/19/2015 03/20/2015 03/06/2015 123456  Systolic BP - [id number] 123456  Diastolic BP - 68 68 68 70  Wt. (Lbs) 156.4 151.12 155 153 152  BMI 23.79 22.98 23.57 23.27 23.12   Foot/eye exam completion dates Latest Ref Rng 08/16/2014 06/26/2014  Eye Exam  No Retinopathy No Retinopathy -  Foot Form Completion - - Done  A1c is 5.8   Romer  reports that he has been smoking.  He has never used smokeless tobacco. He reports that he does not drink alcohol or use illicit drugs.     Assessment & Plan:    Controlled type 2 diabetes mellitus with complication, without long-term current use of insulin (HCC)  Conjunctivitis of left eye - Plan: erythromycin [facility]) ophthalmic ointment  Hyperlipidemia associated with type 2 diabetes mellitus ([place])  TOBACCO ABUSE  Hypertension associated with diabetes ([place])  Other emphysema ([place])  Right inguinal hernia   1. Rx changes: Romycin 2. Education: Reviewed 'ABCs' of diabetes management (respective goals in parentheses):  A1C (<7), blood pressure (<130/80), and cholesterol (LDL <100). 3. Compliance at present is estimated to be fair. Efforts to improve compliance (if necessary) will be directed at No change. 4. Follow up: 4 months

## 2015-10-08 ENCOUNTER — Other Ambulatory Visit: Payer: Self-pay | Admitting: General Surgery

## 2015-10-08 ENCOUNTER — Ambulatory Visit: Payer: Self-pay | Admitting: General Surgery

## 2015-10-08 DIAGNOSIS — K429 Umbilical hernia without obstruction or gangrene: Secondary | ICD-10-CM | POA: Diagnosis not present

## 2015-10-08 DIAGNOSIS — K4090 Unilateral inguinal hernia, without obstruction or gangrene, not specified as recurrent: Secondary | ICD-10-CM | POA: Diagnosis not present

## 2015-10-08 NOTE — H&P (Signed)
Donald Richardson 10/08/2015 3:29 PM Location: [facility] Patient #: [id number] DOB: 08/06/1940 Single / Language: Donald Richardson / Race: White Male  History of Present Illness Donald Hollingshead MD; 10/08/2015 4:22 PM) Patient words: new pt, inguinal hernia.  The patient is a 75 year old male.   Note:He is referred by Dr. Dorris Carnes for consultation regarding a symptomatic left inguinal hernia. He states he noticed it last fall when he had a severe coughing episode. Now it tends to migrate down into the scrotum and sometimes he has to manually reduce it when he lies down. It does cause some intermittent discomfort. No obstruction symptoms. He denies difficulty with urination. He denies constipation. He is a smoker. He states he does not cough frequently. He has a history of coronary artery disease and has stents in. He takes Plavix and aspirin. Dr. Harrington Challenger feels he is an acceptable candidate to have the inguinal hernia repaired.  Other Problems (April Staton, CMA; 10/08/2015 3:29 PM) Hypercholesterolemia Myocardial infarction  Diagnostic Studies History (April Staton, [place]; 10/08/2015 3:29 PM) Colonoscopy within last year  Allergies (April Staton, CMA; 10/08/2015 3:30 PM) No Known Drug Allergies 10/08/2015  Medication History (April Staton, [place]; 10/08/2015 3:31 PM) Erythromycin (5MG /GM Ointment, Ophthalmic) Active. Lisinopril (10MG  Tablet, Oral) Active. Clopidogrel Bisulfate (75MG  Tablet, Oral) Active. Aspirin (81MG  Tablet, Oral) Active. Rosuvastatin Calcium (40MG  Tablet, Oral) Active. Medications Reconciled  Social History (April Staton, CMA; 10/08/2015 3:29 PM) Alcohol use Occasional alcohol use. Caffeine use Carbonated beverages. No drug use Tobacco use Current every day smoker.  Family History (April Staton, [place]; 10/08/2015 3:29 PM) Breast Cancer Mother.     Review of Systems (April Staton CMA; 10/08/2015 3:29 PM) General Not Present- Appetite Loss, Chills,  Fatigue, Fever, Night Sweats, Weight Gain and Weight Loss. Skin Not Present- Change in Wart/Mole, Dryness, Hives, Jaundice, New Lesions, Non-Healing Wounds, Rash and Ulcer. HEENT Present- Hearing Loss and Wears glasses/contact lenses. Not Present- Earache, Hoarseness, Nose Bleed, Oral Ulcers, Ringing in the Ears, Seasonal Allergies, Sinus Pain, Sore Throat, Visual Disturbances and Yellow Eyes. Respiratory Not Present- Bloody sputum, Chronic Cough, Difficulty Breathing, Snoring and Wheezing. Cardiovascular Not Present- Chest Pain, Difficulty Breathing Lying Down, Leg Cramps, Palpitations, Rapid Heart Rate, Shortness of Breath and Swelling of Extremities. Gastrointestinal Not Present- Abdominal Pain, Bloating, Bloody Stool, Change in Bowel Habits, Chronic diarrhea, Constipation, Difficulty Swallowing, Excessive gas, Gets full quickly at meals, Hemorrhoids, Indigestion, Nausea, Rectal Pain and Vomiting. Male Genitourinary Not Present- Blood in Urine, Change in Urinary Stream, Frequency, Impotence, Nocturia, Painful Urination, Urgency and Urine Leakage. Musculoskeletal Not Present- Back Pain, Joint Pain, Joint Stiffness, Muscle Pain, Muscle Weakness and Swelling of Extremities. Neurological Not Present- Decreased Memory, Fainting, Headaches, Numbness, Seizures, Tingling, Tremor, Trouble walking and Weakness. Psychiatric Not Present- Anxiety, Bipolar, Change in Sleep Pattern, Depression, Fearful and Frequent crying. Endocrine Not Present- Cold Intolerance, Excessive Hunger, Hair Changes, Heat Intolerance, Hot flashes and New Diabetes. Hematology Present- Blood Thinners and Easy Bruising. Not Present- Excessive bleeding, Gland problems, HIV and Persistent Infections.  Vitals (April Staton CMA; 10/08/2015 3:32 PM) 10/08/2015 3:31 PM Weight: 152.25 lb Height: 68in Height was reported by patient. Body Surface Area: 1.82 m Body Mass Index: 23.15 kg/m  Temp.: 97.33F(Oral)  Pulse: 57 (Regular)   P.OX: 97% (Room air) BP: 150/70 (Sitting, Left Arm, Standard)      Physical Exam Donald Hollingshead MD; 10/08/2015 4:25 PM)  The physical exam findings are as follows: Note:General: WDWN in NAD. Pleasant and cooperative.  HEENT: [place]/AT, hearing  aids present  NECK: Supple, scar on right side  CV: RRR, no murmur, no edema  CHEST: Breath sounds distant, equal and clear. Respirations nonlabored.  ABDOMEN: Soft, nontender, nondistended, umbilical bulge present  GU: No right inguinal bulge. Large left inguinal bulge that is reducible in the supine position. The hernia extends down close to the left testicle. No testicular masses.  MUSCULOSKELETAL: FROM, good muscle tone, no edema  NEUROLOGIC: Alert and oriented, answers questions appropriately, normal gait and station.  PSYCHIATRIC: Normal mood, affect , and behavior.    Assessment & Plan Donald Hollingshead MD; 10/08/2015 4:25 PM)  LEFT INGUINAL HERNIA (K40.90) Impression: This is symptomatic at times. Also occasionally has to be manually reduced. He is interested in having this repair.  Plan: Open left inguinal hernia. Mesh as an outpatient. His brother will be able to come help take care of him. We'll need to stop his aspirin and Plavix preoperatively. I have also asked him to stop smoking preoperatively, and he states he feels he can do this. I have explained the procedure, risks, and aftercare of inguinal hernia repair. Risks include but are not limited to bleeding, infection, wound problems, anesthesia, recurrence, bladder or intestine injury, urinary retention, testicular dysfunction, chronic pain, mesh problems. He seems to understand and agrees to proceed.  Donald Confer, MD

## 2015-10-23 ENCOUNTER — Encounter (HOSPITAL_COMMUNITY): Payer: Self-pay

## 2015-10-23 ENCOUNTER — Encounter (HOSPITAL_COMMUNITY)
Admission: RE | Admit: 2015-10-23 | Discharge: 2015-10-23 | Disposition: A | Discharge status: Home / Self Care | Payer: Medicare Other | Source: Ambulatory Visit | Attending: General Surgery | Admitting: General Surgery

## 2015-10-23 ENCOUNTER — Other Ambulatory Visit: Payer: Self-pay | Admitting: Internal Medicine

## 2015-10-23 ENCOUNTER — Telehealth: Payer: Self-pay | Admitting: Internal Medicine

## 2015-10-23 DIAGNOSIS — E7800 Pure hypercholesterolemia, unspecified: Secondary | ICD-10-CM | POA: Diagnosis not present

## 2015-10-23 DIAGNOSIS — I252 Old myocardial infarction: Secondary | ICD-10-CM | POA: Diagnosis not present

## 2015-10-23 DIAGNOSIS — I10 Essential (primary) hypertension: Secondary | ICD-10-CM | POA: Diagnosis not present

## 2015-10-23 DIAGNOSIS — Z79899 Other long term (current) drug therapy: Secondary | ICD-10-CM | POA: Diagnosis not present

## 2015-10-23 DIAGNOSIS — E785 Hyperlipidemia, unspecified: Principal | ICD-10-CM

## 2015-10-23 DIAGNOSIS — E1169 Type 2 diabetes mellitus with other specified complication: Secondary | ICD-10-CM

## 2015-10-23 DIAGNOSIS — I2510 Atherosclerotic heart disease of native coronary artery without angina pectoris: Secondary | ICD-10-CM | POA: Diagnosis not present

## 2015-10-23 DIAGNOSIS — E119 Type 2 diabetes mellitus without complications: Secondary | ICD-10-CM | POA: Diagnosis not present

## 2015-10-23 DIAGNOSIS — K4090 Unilateral inguinal hernia, without obstruction or gangrene, not specified as recurrent: Secondary | ICD-10-CM | POA: Diagnosis not present

## 2015-10-23 DIAGNOSIS — F17200 Nicotine dependence, unspecified, uncomplicated: Secondary | ICD-10-CM | POA: Diagnosis not present

## 2015-10-23 DIAGNOSIS — J449 Chronic obstructive pulmonary disease, unspecified: Secondary | ICD-10-CM | POA: Diagnosis not present

## 2015-10-23 HISTORY — DX: Chronic obstructive pulmonary disease, unspecified: J44.9

## 2015-10-23 HISTORY — DX: Essential (primary) hypertension: I10

## 2015-10-23 LAB — CBC WITH DIFFERENTIAL/PLATELET
BASOS ABS: 0 10*3/uL (ref 0.0–0.1)
Basophils Relative: 0 %
EOS ABS: 0.2 10*3/uL (ref 0.0–0.7)
EOS PCT: 3 %
HCT: 49.1 % (ref 39.0–52.0)
HEMOGLOBIN: 16.3 g/dL (ref 13.0–17.0)
LYMPHS PCT: 15 %
Lymphs Abs: 1.1 10*3/uL (ref 0.7–4.0)
MCH: 30.1 pg (ref 26.0–34.0)
MCHC: 33.2 g/dL (ref 30.0–36.0)
MCV: 90.6 fL (ref 78.0–100.0)
Monocytes Absolute: 0.8 10*3/uL (ref 0.1–1.0)
Monocytes Relative: 11 %
NEUTROS PCT: 71 %
Neutro Abs: 5.4 10*3/uL (ref 1.7–7.7)
PLATELETS: 121 10*3/uL — AB (ref 150–400)
RBC: 5.42 MIL/uL (ref 4.22–5.81)
RDW: 14.2 % (ref 11.5–15.5)
WBC: 7.6 10*3/uL (ref 4.0–10.5)

## 2015-10-23 LAB — COMPREHENSIVE METABOLIC PANEL
ALK PHOS: 79 U/L (ref 38–126)
ALT: 14 U/L — AB (ref 17–63)
ANION GAP: 7 (ref 5–15)
AST: 15 U/L (ref 15–41)
Albumin: 4 g/dL (ref 3.5–5.0)
BUN: 19 mg/dL (ref 6–20)
CALCIUM: 9.2 mg/dL (ref 8.9–10.3)
CO2: 27 mmol/L (ref 22–32)
Chloride: 106 mmol/L (ref 101–111)
Creatinine, Ser: 1.39 mg/dL — ABNORMAL HIGH (ref 0.61–1.24)
GFR calc non Af Amer: 49 mL/min — ABNORMAL LOW (ref >60)
GFR, EST AFRICAN AMERICAN: 56 mL/min — AB (ref >60)
Glucose, Bld: 105 mg/dL — ABNORMAL HIGH (ref 65–99)
POTASSIUM: 4.1 mmol/L (ref 3.5–5.1)
SODIUM: 140 mmol/L (ref 135–145)
TOTAL PROTEIN: 7.6 g/dL (ref 6.5–8.1)
Total Bilirubin: 0.8 mg/dL (ref 0.3–1.2)

## 2015-10-23 NOTE — Pre-Procedure Instructions (Signed)
EKG and LOV with cardiologist in [facility]

## 2015-10-23 NOTE — Patient Instructions (Signed)
Donald Richardson  10/23/2015   Your procedure is scheduled on: 10/26/15  Report to [facility]  Entrance take [facility]  elevators to 3rd floor to  [place] at 5:30 AM.  Call this number if you have problems the morning of surgery [phone]   Remember: ONLY 1 PERSON MAY GO WITH YOU TO SHORT STAY TO GET  READY MORNING OF [place].  Do not eat food or drink liquids :After Midnight Thursday     Take these medicines the morning of surgery with A SIP OF WATER: none                                Do not wear jewelry,  lotions, powders, deodorant                Do not bring valuables to the hospital. [place].  Contacts, dentures or bridgework may not be worn into surgery.      Patients discharged the day of surgery will not be allowed to drive home.  Name and phone number of your driver: Donald Richardson              _____________________________________________________________________             [facility] - Preparing for Surgery Before surgery, you can play an important role.  Because skin is not sterile, your skin needs to be as free of germs as possible.  You can reduce the number of germs on your skin by washing with CHG (chlorahexidine gluconate) soap before surgery.  CHG is an antiseptic cleaner which kills germs and bonds with the skin to continue killing germs even after washing. Please DO NOT use if you have an allergy to CHG or antibacterial soaps.  If your skin becomes reddened/irritated stop using the CHG and inform your nurse when you arrive at Short Stay. Do not shave (including legs and underarms) for at least 48 hours prior to the first CHG shower.  You may shave your face/neck. Please follow these instructions carefully:  1.  Shower with CHG Soap the night before surgery and the  morning of Surgery.  2.  If you choose to wash your hair, wash your hair first as usual with your  normal   shampoo.  3.  After you shampoo, rinse your hair and body thoroughly to remove the  shampoo.                           4.  Use CHG as you would any other liquid soap.  You can apply chg directly  to the skin and wash                       Gently with a scrungie or clean washcloth.  5.  Apply the CHG Soap to your body ONLY FROM THE NECK DOWN.   Do not use on face/ open                           Wound or open sores. Avoid contact with eyes, ears mouth and genitals (private parts).  Wash face,  Genitals (private parts) with your normal soap.             6.  Wash thoroughly, paying special attention to the area where your surgery  will be performed.  7.  Thoroughly rinse your body with warm water from the neck down.  8.  DO NOT shower/wash with your normal soap after using and rinsing off  the CHG Soap.                9.  Pat yourself dry with a clean towel.            10.  Wear clean pajamas.            11.  Place clean sheets on your bed the night of your first shower and do not  sleep with pets. Day of Surgery : Do not apply any lotions/deodorants the morning of surgery.  Please wear clean clothes to the hospital/surgery center.  FAILURE TO FOLLOW THESE INSTRUCTIONS MAY RESULT IN THE CANCELLATION OF YOUR SURGERY PATIENT SIGNATURE_________________________________  NURSE SIGNATURE__________________________________  ________________________________________________________________________ [facility] Before surgery, you can play an important role.  Because skin is not sterile, your skin needs to be as free of germs as possible.  You can reduce the number of germs on your skin by washing with CHG (chlorahexidine gluconate) soap before surgery.  CHG is an antiseptic cleaner which kills germs and bonds with the skin to continue killing germs even after washing. Please DO NOT use if you have an allergy to CHG or antibacterial soaps.  If your skin becomes  reddened/irritated stop using the CHG and inform your nurse when you arrive at Short Stay. Do not shave (including legs and underarms) for at least 48 hours prior to the first CHG shower.  You may shave your face/neck. Please follow these instructions carefully:  1.  Shower with CHG Soap the night before surgery and the  morning of Surgery.  2.  If you choose to wash your hair, wash your hair first as usual with your  normal  shampoo.  3.  After you shampoo, rinse your hair and body thoroughly to remove the  shampoo.                           4.  Use CHG as you would any other liquid soap.  You can apply chg directly  to the skin and wash                       Gently with a scrungie or clean washcloth.  5.  Apply the CHG Soap to your body ONLY FROM THE NECK DOWN.   Do not use on face/ open                           Wound or open sores. Avoid contact with eyes, ears mouth and genitals (private parts).                       Wash face,  Genitals (private parts) with your normal soap.             6.  Wash thoroughly, paying special attention to the area where your surgery  will be performed.  7.  Thoroughly rinse your body with warm water from the neck down.  8.  DO NOT  shower/wash with your normal soap after using and rinsing off  the CHG Soap.                9.  Pat yourself dry with a clean towel.            10.  Wear clean pajamas.            11.  Place clean sheets on your bed the night of your first shower and do not  sleep with pets. Day of Surgery : Do not apply any lotions/deodorants the morning of surgery.  Please wear clean clothes to the hospital/surgery center.  FAILURE TO FOLLOW THESE INSTRUCTIONS MAY RESULT IN THE CANCELLATION OF YOUR SURGERY PATIENT SIGNATURE_________________________________  NURSE SIGNATURE__________________________________  ________________________________________________________________________

## 2015-10-23 NOTE — Pre-Procedure Instructions (Signed)
Preop lab results routed to Dr. Zella Richer and note attached about low platelets.

## 2015-10-23 NOTE — Telephone Encounter (Signed)
Pt called   Needs refills on Crestor  Call in to [facility] ([place] and [place])  Surgery on Friday  Holding Plavix and aspirin

## 2015-10-24 ENCOUNTER — Other Ambulatory Visit: Payer: Self-pay | Admitting: *Deleted

## 2015-10-24 DIAGNOSIS — E785 Hyperlipidemia, unspecified: Principal | ICD-10-CM

## 2015-10-24 DIAGNOSIS — E1169 Type 2 diabetes mellitus with other specified complication: Secondary | ICD-10-CM

## 2015-10-24 MED ORDER — ROSUVASTATIN CALCIUM 40 MG PO TABS: 40.0000 mg | ORAL_TABLET | Freq: Every day | ORAL | 3 refills | Status: DC

## 2015-10-25 NOTE — Anesthesia Preprocedure Evaluation (Addendum)
Anesthesia Evaluation  Patient identified by MRN, date of birth, ID band Patient awake    Reviewed: Allergy & Precautions, NPO status , Patient's Chart, lab work & pertinent test results  Airway Mallampati: II  TM Distance: >3 FB Neck ROM: Limited    Dental  (+) Teeth Intact, Dental Advisory Given   Pulmonary COPD, Current Smoker,    breath sounds clear to auscultation       Cardiovascular hypertension, + CAD   Rhythm:Regular Rate:Normal     Neuro/Psych negative neurological ROS     GI/Hepatic negative GI ROS, Neg liver ROS,   Endo/Other  diabetes  Renal/GU negative Renal ROS     Musculoskeletal negative musculoskeletal ROS (+)   Abdominal   Peds  Hematology   Anesthesia Other Findings   Reproductive/Obstetrics                            Anesthesia Physical Anesthesia Plan  ASA: III  Anesthesia Plan: General   Post-op Pain Management:  Regional for Post-op pain   Induction: Intravenous  Airway Management Planned: Oral ETT and LMA  Additional Equipment:   Intra-op Plan:   Post-operative Plan: Extubation in OR  Informed Consent: I have reviewed the patients History and Physical, chart, labs and discussed the procedure including the risks, benefits and alternatives for the proposed anesthesia with the patient or authorized representative who has indicated his/her understanding and acceptance.     Plan Discussed with:   Anesthesia Plan Comments:         Anesthesia Quick Evaluation

## 2015-10-26 ENCOUNTER — Ambulatory Visit (HOSPITAL_COMMUNITY): Payer: Medicare Other | Admitting: Anesthesiology

## 2015-10-26 ENCOUNTER — Encounter (HOSPITAL_COMMUNITY): Payer: Self-pay | Admitting: Anesthesiology

## 2015-10-26 ENCOUNTER — Encounter (HOSPITAL_COMMUNITY)
Admission: RE | Disposition: A | Discharge status: Home / Self Care | Payer: Self-pay | Source: Ambulatory Visit | Attending: General Surgery

## 2015-10-26 ENCOUNTER — Ambulatory Visit (HOSPITAL_COMMUNITY)
Admission: RE | Admit: 2015-10-26 | Discharge: 2015-10-26 | Disposition: A | Discharge status: Home / Self Care | Payer: Medicare Other | Source: Ambulatory Visit | Attending: General Surgery | Admitting: General Surgery

## 2015-10-26 DIAGNOSIS — E119 Type 2 diabetes mellitus without complications: Secondary | ICD-10-CM | POA: Diagnosis not present

## 2015-10-26 DIAGNOSIS — K4090 Unilateral inguinal hernia, without obstruction or gangrene, not specified as recurrent: Secondary | ICD-10-CM | POA: Insufficient documentation

## 2015-10-26 DIAGNOSIS — F17200 Nicotine dependence, unspecified, uncomplicated: Secondary | ICD-10-CM | POA: Diagnosis not present

## 2015-10-26 DIAGNOSIS — Z79899 Other long term (current) drug therapy: Secondary | ICD-10-CM | POA: Insufficient documentation

## 2015-10-26 DIAGNOSIS — I10 Essential (primary) hypertension: Secondary | ICD-10-CM | POA: Insufficient documentation

## 2015-10-26 DIAGNOSIS — I2510 Atherosclerotic heart disease of native coronary artery without angina pectoris: Secondary | ICD-10-CM | POA: Diagnosis not present

## 2015-10-26 DIAGNOSIS — E7800 Pure hypercholesterolemia, unspecified: Secondary | ICD-10-CM | POA: Diagnosis not present

## 2015-10-26 DIAGNOSIS — G8918 Other acute postprocedural pain: Secondary | ICD-10-CM | POA: Diagnosis not present

## 2015-10-26 DIAGNOSIS — R109 Unspecified abdominal pain: Secondary | ICD-10-CM | POA: Diagnosis not present

## 2015-10-26 DIAGNOSIS — J449 Chronic obstructive pulmonary disease, unspecified: Secondary | ICD-10-CM | POA: Insufficient documentation

## 2015-10-26 DIAGNOSIS — I252 Old myocardial infarction: Secondary | ICD-10-CM | POA: Insufficient documentation

## 2015-10-26 HISTORY — PX: INSERTION OF MESH: SHX5868

## 2015-10-26 HISTORY — PX: INGUINAL HERNIA REPAIR: SHX194

## 2015-10-26 LAB — GLUCOSE, CAPILLARY: GLUCOSE-CAPILLARY: 133 mg/dL — AB (ref 65–99)

## 2015-10-26 SURGERY — REPAIR, HERNIA, INGUINAL, ADULT
Anesthesia: General | Site: Groin | Laterality: Left

## 2015-10-26 MED ORDER — ACETAMINOPHEN 650 MG RE SUPP
650.0000 mg | RECTAL | Status: DC | PRN
  Filled 2015-10-26: qty 1

## 2015-10-26 MED ORDER — ACETAMINOPHEN 325 MG PO TABS: 650.0000 mg | ORAL_TABLET | ORAL | Status: DC | PRN

## 2015-10-26 MED ORDER — SODIUM CHLORIDE 0.9 % IJ SOLN
INTRAMUSCULAR | Status: AC
  Filled 2015-10-26: qty 10

## 2015-10-26 MED ORDER — BUPIVACAINE-EPINEPHRINE 0.5% -1:200000 IJ SOLN
INTRAMUSCULAR | Status: DC | PRN
  Administered 2015-10-26: 10 mL
  Administered 2015-10-26: 7 mL

## 2015-10-26 MED ORDER — EPHEDRINE SULFATE 50 MG/ML IJ SOLN
INTRAMUSCULAR | Status: AC
  Filled 2015-10-26: qty 1

## 2015-10-26 MED ORDER — SODIUM CHLORIDE 0.9% FLUSH: 3.0000 mL | INTRAVENOUS | Status: DC | PRN

## 2015-10-26 MED ORDER — ACETAMINOPHEN 10 MG/ML IV SOLN
INTRAVENOUS | Status: DC | PRN
  Administered 2015-10-26: 1000 mg via INTRAVENOUS

## 2015-10-26 MED ORDER — BUPIVACAINE HCL (PF) 0.5 % IJ SOLN
INTRAMUSCULAR | Status: DC | PRN
  Administered 2015-10-26: 30 mL

## 2015-10-26 MED ORDER — LACTATED RINGERS IV SOLN
INTRAVENOUS | Status: DC | PRN
  Administered 2015-10-26: 07:00:00 via INTRAVENOUS

## 2015-10-26 MED ORDER — MIDAZOLAM HCL 5 MG/5ML IJ SOLN
INTRAMUSCULAR | Status: DC | PRN
  Administered 2015-10-26: 1 mg via INTRAVENOUS

## 2015-10-26 MED ORDER — 0.9 % SODIUM CHLORIDE (POUR BTL) OPTIME
TOPICAL | Status: DC | PRN
  Administered 2015-10-26: 1000 mL

## 2015-10-26 MED ORDER — FENTANYL CITRATE (PF) 100 MCG/2ML IJ SOLN
INTRAMUSCULAR | Status: DC | PRN
  Administered 2015-10-26: 50 ug via INTRAVENOUS
  Administered 2015-10-26 (×2): 25 ug via INTRAVENOUS

## 2015-10-26 MED ORDER — BUPIVACAINE-EPINEPHRINE (PF) 0.5% -1:200000 IJ SOLN
INTRAMUSCULAR | Status: AC
  Filled 2015-10-26: qty 30

## 2015-10-26 MED ORDER — CHLORHEXIDINE GLUCONATE CLOTH 2 % EX PADS: 6.0000 | MEDICATED_PAD | Freq: Once | CUTANEOUS | Status: DC

## 2015-10-26 MED ORDER — MIDAZOLAM HCL 2 MG/2ML IJ SOLN
INTRAMUSCULAR | Status: AC
  Filled 2015-10-26: qty 2

## 2015-10-26 MED ORDER — LACTATED RINGERS IV SOLN
INTRAVENOUS | Status: DC
  Administered 2015-10-26: 11:00:00 via INTRAVENOUS

## 2015-10-26 MED ORDER — MORPHINE SULFATE (PF) 10 MG/ML IV SOLN: 2.0000 mg | INTRAVENOUS | Status: DC | PRN

## 2015-10-26 MED ORDER — LIDOCAINE 2% (20 MG/ML) 5 ML SYRINGE
INTRAMUSCULAR | Status: DC | PRN
  Administered 2015-10-26: 40 mg via INTRAVENOUS

## 2015-10-26 MED ORDER — OXYCODONE HCL 5 MG PO TABS: 5.0000 mg | ORAL_TABLET | ORAL | 0 refills | Status: DC | PRN

## 2015-10-26 MED ORDER — PROPOFOL 10 MG/ML IV BOLUS
INTRAVENOUS | Status: DC | PRN
  Administered 2015-10-26: 150 mg via INTRAVENOUS

## 2015-10-26 MED ORDER — FENTANYL CITRATE (PF) 100 MCG/2ML IJ SOLN
INTRAMUSCULAR | Status: AC
  Filled 2015-10-26: qty 2

## 2015-10-26 MED ORDER — PROMETHAZINE HCL 25 MG/ML IJ SOLN: 6.2500 mg | INTRAMUSCULAR | Status: DC | PRN

## 2015-10-26 MED ORDER — ONDANSETRON HCL 4 MG/2ML IJ SOLN
INTRAMUSCULAR | Status: AC
  Filled 2015-10-26: qty 2

## 2015-10-26 MED ORDER — LIDOCAINE HCL (CARDIAC) 20 MG/ML IV SOLN
INTRAVENOUS | Status: AC
Start: 2015-10-26 — End: 2015-10-26
  Filled 2015-10-26: qty 5

## 2015-10-26 MED ORDER — ACETAMINOPHEN 10 MG/ML IV SOLN
INTRAVENOUS | Status: AC
Start: 2015-10-26 — End: 2015-10-26
  Filled 2015-10-26: qty 100

## 2015-10-26 MED ORDER — CEFAZOLIN SODIUM-DEXTROSE 2-4 GM/100ML-% IV SOLN
2.0000 g | INTRAVENOUS | Status: AC
  Administered 2015-10-26: 2 g via INTRAVENOUS

## 2015-10-26 MED ORDER — HYDROMORPHONE HCL 1 MG/ML IJ SOLN: 0.2500 mg | INTRAMUSCULAR | Status: DC | PRN

## 2015-10-26 MED ORDER — PROPOFOL 10 MG/ML IV BOLUS
INTRAVENOUS | Status: AC
  Filled 2015-10-26: qty 20

## 2015-10-26 MED ORDER — ONDANSETRON HCL 4 MG/2ML IJ SOLN
INTRAMUSCULAR | Status: DC | PRN
  Administered 2015-10-26: 4 mg via INTRAVENOUS

## 2015-10-26 MED ORDER — OXYCODONE HCL 5 MG PO TABS: 5.0000 mg | ORAL_TABLET | ORAL | Status: DC | PRN

## 2015-10-26 MED ORDER — CEFAZOLIN SODIUM-DEXTROSE 2-4 GM/100ML-% IV SOLN
INTRAVENOUS | Status: AC
  Filled 2015-10-26: qty 100

## 2015-10-26 SURGICAL SUPPLY — 40 items
APL SKNCLS STERI-STRIP NONHPOA (GAUZE/BANDAGES/DRESSINGS) ×2
BENZOIN TINCTURE PRP APPL 2/3 (GAUZE/BANDAGES/DRESSINGS) ×4 IMPLANT
BLADE SURG 15 STRL LF DISP TIS (BLADE) ×2 IMPLANT
BLADE SURG 15 STRL SS (BLADE) ×4
CHLORAPREP W/TINT 26ML (MISCELLANEOUS) ×4 IMPLANT
CLOSURE WOUND 1/2 X4 (GAUZE/BANDAGES/DRESSINGS) ×1
COVER SURGICAL LIGHT HANDLE (MISCELLANEOUS) ×4 IMPLANT
DECANTER SPIKE VIAL GLASS SM (MISCELLANEOUS) ×4 IMPLANT
DRAIN PENROSE 18X1/2 LTX STRL (DRAIN) ×4 IMPLANT
DRAPE INCISE IOBAN 66X45 STRL (DRAPES) ×4 IMPLANT
DRAPE LAPAROTOMY TRNSV 102X78 (DRAPE) ×4 IMPLANT
DRSG TEGADERM 4X4.75 (GAUZE/BANDAGES/DRESSINGS) ×4 IMPLANT
DRSG TELFA 3X8 NADH (GAUZE/BANDAGES/DRESSINGS) ×4 IMPLANT
ELECT PENCIL ROCKER SW 15FT (MISCELLANEOUS) ×4 IMPLANT
ELECT REM PT RETURN 9FT ADLT (ELECTROSURGICAL) ×4
ELECTRODE REM PT RTRN 9FT ADLT (ELECTROSURGICAL) ×2 IMPLANT
GAUZE SPONGE 4X4 12PLY STRL (GAUZE/BANDAGES/DRESSINGS) ×2 IMPLANT
GLOVE BIOGEL PI IND STRL 6.5 (GLOVE) IMPLANT
GLOVE BIOGEL PI INDICATOR 6.5 (GLOVE) ×4
GLOVE ECLIPSE 8.0 STRL XLNG CF (GLOVE) ×4 IMPLANT
GLOVE INDICATOR 8.0 STRL GRN (GLOVE) ×4 IMPLANT
GOWN STRL REUS W/TWL XL LVL3 (GOWN DISPOSABLE) ×8 IMPLANT
KIT BASIN OR (CUSTOM PROCEDURE TRAY) ×4 IMPLANT
MESH HERNIA 3X6 (Mesh General) ×2 IMPLANT
NDL HYPO 25X1 1.5 SAFETY (NEEDLE) ×2 IMPLANT
NEEDLE HYPO 25X1 1.5 SAFETY (NEEDLE) ×4 IMPLANT
PACK BASIC VI WITH GOWN DISP (CUSTOM PROCEDURE TRAY) ×4 IMPLANT
PAD DRESSING TELFA 3X8 NADH (GAUZE/BANDAGES/DRESSINGS) IMPLANT
SPONGE LAP 4X18 X RAY DECT (DISPOSABLE) ×4 IMPLANT
STRIP CLOSURE SKIN 1/2X4 (GAUZE/BANDAGES/DRESSINGS) ×3 IMPLANT
SUT MNCRL AB 4-0 PS2 18 (SUTURE) ×4 IMPLANT
SUT PROLENE 2 0 CT2 30 (SUTURE) ×8 IMPLANT
SUT VIC AB 2-0 SH 18 (SUTURE) ×4 IMPLANT
SUT VIC AB 3-0 SH 27 (SUTURE) ×4
SUT VIC AB 3-0 SH 27XBRD (SUTURE) ×2 IMPLANT
SYR BULB IRRIGATION 50ML (SYRINGE) ×4 IMPLANT
SYR CONTROL 10ML LL (SYRINGE) ×4 IMPLANT
TOWEL OR 17X26 10 PK STRL BLUE (TOWEL DISPOSABLE) ×4 IMPLANT
TOWEL OR NON WOVEN STRL DISP B (DISPOSABLE) ×4 IMPLANT
YANKAUER SUCT BULB TIP 10FT TU (MISCELLANEOUS) ×4 IMPLANT

## 2015-10-26 NOTE — Transfer of Care (Signed)
Immediate Anesthesia Transfer of Care Note  Patient: Donald Richardson  Procedure(s) Performed: Procedure(s): LEFT INGUINAL HERNIA REPAIR WITH MESH (Left) INSERTION OF MESH (Left)  Patient Location: PACU  Anesthesia Type:General  Level of Consciousness: awake  Airway & Oxygen Therapy: Patient Spontanous Breathing and Patient connected to face mask oxygen  Post-op Assessment: Report given to RN and Post -op Vital signs reviewed and stable  Post vital signs: Reviewed and stable  Last Vitals:  Vitals:   10/26/15 0525  BP: (!) 142/86  Pulse: 74  Resp: 16  Temp: 36.7 C    Last Pain:  Vitals:   10/26/15 0525  TempSrc: Oral      Patients Stated Pain Goal: 4 ([id number])  Complications: No apparent anesthesia complications

## 2015-10-26 NOTE — Progress Notes (Signed)
Patient was allowed to take a power nap, then awakened for assessment. Nausea completely gone. Assisted up to void. Denies dizziness and tolerated well.Marland Kitchen

## 2015-10-26 NOTE — Interval H&P Note (Signed)
History and Physical Interval Note:  10/26/2015 7:26 AM  Donald Richardson  has presented today for surgery, with the diagnosis of Left inguinal hernia  The various methods of treatment have been discussed with the patient and family. After consideration of risks, benefits and other options for treatment, the patient has consented to  Procedure(s): LEFT INGUINAL HERNIA REPAIR WITH MESH (Left) INSERTION OF MESH (Left) as a surgical intervention .  The patient's history has been reviewed, patient examined, no change in status, stable for surgery.  I have reviewed the patient's chart and labs.  Questions were answered to the patient's satisfaction.     Donald Richardson

## 2015-10-26 NOTE — Progress Notes (Signed)
Call to Dr Orene Desanctis  RE: patient status. Ok to dc to brother's home

## 2015-10-26 NOTE — Anesthesia Postprocedure Evaluation (Signed)
Anesthesia Post Note  Patient: DIVON TEAGLE  Procedure(s) Performed: Procedure(s) (LRB): LEFT INGUINAL HERNIA REPAIR WITH MESH (Left) INSERTION OF MESH (Left)  Patient location during evaluation: PACU Anesthesia Type: General and Regional Level of consciousness: awake and sedated Pain management: pain level controlled Vital Signs Assessment: post-procedure vital signs reviewed and stable Respiratory status: spontaneous breathing, nonlabored ventilation, respiratory function stable and patient connected to nasal cannula oxygen Cardiovascular status: blood pressure returned to baseline and stable Postop Assessment: no signs of nausea or vomiting Anesthetic complications: no    Last Vitals:  Vitals:   10/26/15 1003 10/26/15 1004  BP:    Pulse: (!) 45 (!) 44  Resp: (!) 9 10  Temp:      Last Pain:  Vitals:   10/26/15 0930  TempSrc:   PainSc: 0-No pain                 Lindee Leason,JAMES TERRILL

## 2015-10-26 NOTE — Progress Notes (Signed)
Patient became nauseated as stretcher was rolled to PACU door- patient then vomited approximately 50 cc of clear-colored emesis- heart rate 58 and regular- nausea better- skin warm and dry- color satisfactory-patient states he feels "O.K."- proceeded to Short Stay- tolerated ride on stretecher well- no nausea now

## 2015-10-26 NOTE — H&P (View-Only) (Signed)
Donald Richardson. [place] 10/08/2015 3:29 PM Location: [facility] Patient #: [id number] DOB: 02-Aug-1940 Single / Language: Donald Richardson / Race: White Male  History of Present Illness Odis Hollingshead MD; 10/08/2015 4:22 PM) Patient words: new pt, inguinal hernia.  The patient is a 75 year old male.   Note:He is referred by Dr. Dorris Carnes for consultation regarding a symptomatic left inguinal hernia. He states he noticed it last fall when he had a severe coughing episode. Now it tends to migrate down into the scrotum and sometimes he has to manually reduce it when he lies down. It does cause some intermittent discomfort. No obstruction symptoms. He denies difficulty with urination. He denies constipation. He is a smoker. He states he does not cough frequently. He has a history of coronary artery disease and has stents in. He takes Plavix and aspirin. Dr. Harrington Challenger feels he is an acceptable candidate to have the inguinal hernia repaired.  Other Problems (April Staton, CMA; 10/08/2015 3:29 PM) Hypercholesterolemia Myocardial infarction  Diagnostic Studies History (April Staton, [place]; 10/08/2015 3:29 PM) Colonoscopy within last year  Allergies (April Staton, CMA; 10/08/2015 3:30 PM) No Known Drug Allergies 10/08/2015  Medication History (April Staton, [place]; 10/08/2015 3:31 PM) Erythromycin (5MG /GM Ointment, Ophthalmic) Active. Lisinopril (10MG  Tablet, Oral) Active. Clopidogrel Bisulfate (75MG  Tablet, Oral) Active. Aspirin (81MG  Tablet, Oral) Active. Rosuvastatin Calcium (40MG  Tablet, Oral) Active. Medications Reconciled  Social History (April Staton, CMA; 10/08/2015 3:29 PM) Alcohol use Occasional alcohol use. Caffeine use Carbonated beverages. No drug use Tobacco use Current every day smoker.  Family History (April Staton, [place]; 10/08/2015 3:29 PM) Breast Cancer Mother.     Review of Systems (April Staton CMA; 10/08/2015 3:29 PM) General Not Present- Appetite Loss, Chills,  Fatigue, Fever, Night Sweats, Weight Gain and Weight Loss. Skin Not Present- Change in Wart/Mole, Dryness, Hives, Jaundice, New Lesions, Non-Healing Wounds, Rash and Ulcer. HEENT Present- Hearing Loss and Wears glasses/contact lenses. Not Present- Earache, Hoarseness, Nose Bleed, Oral Ulcers, Ringing in the Ears, Seasonal Allergies, Sinus Pain, Sore Throat, Visual Disturbances and Yellow Eyes. Respiratory Not Present- Bloody sputum, Chronic Cough, Difficulty Breathing, Snoring and Wheezing. Cardiovascular Not Present- Chest Pain, Difficulty Breathing Lying Down, Leg Cramps, Palpitations, Rapid Heart Rate, Shortness of Breath and Swelling of Extremities. Gastrointestinal Not Present- Abdominal Pain, Bloating, Bloody Stool, Change in Bowel Habits, Chronic diarrhea, Constipation, Difficulty Swallowing, Excessive gas, Gets full quickly at meals, Hemorrhoids, Indigestion, Nausea, Rectal Pain and Vomiting. Male Genitourinary Not Present- Blood in Urine, Change in Urinary Stream, Frequency, Impotence, Nocturia, Painful Urination, Urgency and Urine Leakage. Musculoskeletal Not Present- Back Pain, Joint Pain, Joint Stiffness, Muscle Pain, Muscle Weakness and Swelling of Extremities. Neurological Not Present- Decreased Memory, Fainting, Headaches, Numbness, Seizures, Tingling, Tremor, Trouble walking and Weakness. Psychiatric Not Present- Anxiety, Bipolar, Change in Sleep Pattern, Depression, Fearful and Frequent crying. Endocrine Not Present- Cold Intolerance, Excessive Hunger, Hair Changes, Heat Intolerance, Hot flashes and New Diabetes. Hematology Present- Blood Thinners and Easy Bruising. Not Present- Excessive bleeding, Gland problems, HIV and Persistent Infections.  Vitals (April Staton CMA; 10/08/2015 3:32 PM) 10/08/2015 3:31 PM Weight: 152.25 lb Height: 68in Height was reported by patient. Body Surface Area: 1.82 m Body Mass Index: 23.15 kg/m  Temp.: 97.40F(Oral)  Pulse: 57 (Regular)   P.OX: 97% (Room air) BP: 150/70 (Sitting, Left Arm, Standard)      Physical Exam Odis Hollingshead MD; 10/08/2015 4:25 PM)  The physical exam findings are as follows: Note:General: WDWN in NAD. Pleasant and cooperative.  HEENT: [place]/AT, hearing  aids present  NECK: Supple, scar on right side  CV: RRR, no murmur, no edema  CHEST: Breath sounds distant, equal and clear. Respirations nonlabored.  ABDOMEN: Soft, nontender, nondistended, umbilical bulge present  GU: No right inguinal bulge. Large left inguinal bulge that is reducible in the supine position. The hernia extends down close to the left testicle. No testicular masses.  MUSCULOSKELETAL: FROM, good muscle tone, no edema  NEUROLOGIC: Alert and oriented, answers questions appropriately, normal gait and station.  PSYCHIATRIC: Normal mood, affect , and behavior.    Assessment & Plan Odis Hollingshead MD; 10/08/2015 4:25 PM)  LEFT INGUINAL HERNIA (K40.90) Impression: This is symptomatic at times. Also occasionally has to be manually reduced. He is interested in having this repair.  Plan: Open left inguinal hernia. Mesh as an outpatient. His brother will be able to come help take care of him. We'll need to stop his aspirin and Plavix preoperatively. I have also asked him to stop smoking preoperatively, and he states he feels he can do this. I have explained the procedure, risks, and aftercare of inguinal hernia repair. Risks include but are not limited to bleeding, infection, wound problems, anesthesia, recurrence, bladder or intestine injury, urinary retention, testicular dysfunction, chronic pain, mesh problems. He seems to understand and agrees to proceed.  Jackolyn Confer, MD

## 2015-10-26 NOTE — Op Note (Signed)
OPERATIVE NOTE-INGUINAL HERNIA REPAIR  Preoperative diagnosis:  Left inguinal hernia.  Postoperative diagnosis:  Same  Procedure:  Left inguinal hernia repair with mesh.  Surgeon:  Jackolyn Confer, M.D.  Anesthesia:  General/LMA with TAP block and local ([place]).  Findings:  Indirect and Direct hernias  Indication:  This is a 75 year old male with a symptomatic left inguinal hernia who presents for elective repair.  Technique:  A TAP block was performed by the anesthesiologist in the holding room. He was seen by me in the holding room and the left groin was marked with my initials. He was brought to the operating, placed supine on the operating table, and the anesthetic was administered by the anesthesiologist. The hair in the groin area was clipped as was felt to be necessary. This area was then sterilely prepped and draped.  Local anesthetic was infiltrated in the superficial and deep tissues in the left groin.  An incision was made through the skin and subcutaneous tissue until the external oblique aponeurosis was identified.  Local anesthetic was infiltrated deep to the external oblique aponeurosis. The external oblique aponeurosis was divided through the external ring medially and back toward the anterior superior iliac spine laterally. Using blunt dissection, the shelving edge of the inguinal ligament was identified inferiorly and the internal oblique aponeurosis and muscle were identified superiorly. The ilioinguinal nerve was identified and preserved.  The spermatic cord was isolated and a posterior window was made around it. A indirect and direct hernia sac were identified and separated from the spermatic cord using blunt dissection. The hernia sacs and  contents were reduced through the indirect and direct hernia defects. The separated, attenuated transversalis fascia at the site of the direct defect was reapproximated with 2-0 Vicryl sutures.  The internal inguinal ring was tightened  with 2-0 Vicryl sutures.   A piece of 3" x 6" polypropylene mesh was brought into the field and anchored 1-2 cm medial to the pubic tubercle with 2-0 Prolene suture. The inferior aspect of the mesh was anchored to the shelving edge of the inguinal ligament with running 2-0 Prolene suture to a level 1-2 cm lateral to the internal ring. A slit was cut in the mesh creating 2 tails. These were wrapped around the spermatic cord. The superior aspect of the mesh was anchored to the internal oblique aponeurosis and muscle with interrupted 2-0 Vicryl sutures. The 2 tails of the mesh were then crossed creating a new internal ring and were anchored to the shelving edge of the inguinal ligament with 2-0 Prolene suture. The tip of a hemostat could be placed through the new aperture. The lateral aspect of the mesh was then tucked deep to the external oblique aponeurosis.  The wound was inspected and hemostasis was adequate. The external oblique aponeurosis was then closed over the mesh and cord with running 3-0 Vicryl suture. The subcutaneous tissue was closed with running 3-0 Vicryl suture. The skin closed with a running 4-0 Monocryl subcuticular stitch.  Steri-Strips and a sterile dressing were applied.  The procedure was well-tolerated without any apparent complications and he was taken to the recovery room in satisfactory condition.

## 2015-10-26 NOTE — Progress Notes (Signed)
Dr. Orene Desanctis made aware patient's heart rate can get as low as 39- usually averaging in the 40s- blood pressures stable

## 2015-10-26 NOTE — Progress Notes (Signed)
Dr. Orene Desanctis made aware of patient's heart rates-38-42-- blood pressures stable- O.K. To go to Short Stay  -call Dr. Orene Desanctis  Before patient is discharged and advise him of heart rates

## 2015-10-26 NOTE — Anesthesia Procedure Notes (Signed)
Anesthesia Regional Block:  TAP block  Pre-Anesthetic Checklist: ,, timeout performed, Correct Patient, Correct Site, Correct Laterality, Correct Procedure, Correct Position, site marked, Risks and benefits discussed,  Surgical consent,  Pre-op evaluation,  At surgeon's request and post-op pain management  Laterality: Left and Lower  Prep: chloraprep       Needles:   Needle Type: Stimulator Needle - 80     Needle Length: 9cm 9 cm Needle Gauge: 21 and 21 G  Needle insertion depth: 5 cm   Additional Needles:  Procedures: ultrasound guided (picture in chart) TAP block Narrative:  Start time: 10/26/2015 6:55 AM End time: 10/26/2015 7:05 AM Injection made incrementally with aspirations every 5 mL.  Performed by: Personally  Anesthesiologist: Yomara Toothman  Additional Notes: Tolerated well

## 2015-10-26 NOTE — Anesthesia Procedure Notes (Signed)
Procedure Name: LMA Insertion Date/Time: 10/26/2015 7:31 AM Performed by: Danley Danker L Patient Re-evaluated:Patient Re-evaluated prior to inductionOxygen Delivery Method: Circle system utilized Preoxygenation: Pre-oxygenation with 100% oxygen Intubation Type: IV induction Ventilation: Mask ventilation without difficulty LMA: LMA inserted LMA Size: 4.0 Number of attempts: 1 Placement Confirmation: positive ETCO2 and breath sounds checked- equal and bilateral Tube secured with: Tape Dental Injury: Teeth and Oropharynx as per pre-operative assessment

## 2015-12-19 DIAGNOSIS — H26493 Other secondary cataract, bilateral: Secondary | ICD-10-CM | POA: Diagnosis not present

## 2016-01-10 ENCOUNTER — Other Ambulatory Visit: Payer: Self-pay | Admitting: *Deleted

## 2016-01-10 DIAGNOSIS — R21 Rash and other nonspecific skin eruption: Secondary | ICD-10-CM

## 2016-01-10 NOTE — Progress Notes (Signed)
Order placed for dermatology eval

## 2016-01-10 NOTE — Progress Notes (Signed)
Message  Received: 6 days ago  Message Contents  Fay Records, MD  Rodman Key, RN        Please refer to dermatology Jarome Matin) for skin eval.  Laurel Dimmer       Referral placed to [facility], Dr. Ronnald Ramp.

## 2016-03-04 DIAGNOSIS — D2261 Melanocytic nevi of right upper limb, including shoulder: Secondary | ICD-10-CM | POA: Diagnosis not present

## 2016-03-04 DIAGNOSIS — D485 Neoplasm of uncertain behavior of skin: Secondary | ICD-10-CM | POA: Diagnosis not present

## 2016-03-04 DIAGNOSIS — L570 Actinic keratosis: Secondary | ICD-10-CM | POA: Diagnosis not present

## 2016-03-04 DIAGNOSIS — C44712 Basal cell carcinoma of skin of right lower limb, including hip: Secondary | ICD-10-CM | POA: Diagnosis not present

## 2016-03-04 DIAGNOSIS — L821 Other seborrheic keratosis: Secondary | ICD-10-CM | POA: Diagnosis not present

## 2016-03-20 ENCOUNTER — Ambulatory Visit: Payer: Medicare Other | Admitting: Family Medicine

## 2016-03-24 ENCOUNTER — Ambulatory Visit (INDEPENDENT_AMBULATORY_CARE_PROVIDER_SITE_OTHER): Payer: Medicare Other | Admitting: Family Medicine

## 2016-03-24 ENCOUNTER — Encounter: Payer: Self-pay | Admitting: Family Medicine

## 2016-03-24 VITALS — BP 132/80 | HR 60 | Resp 16 | Ht 68.0 in | Wt 152.6 lb

## 2016-03-24 DIAGNOSIS — I152 Hypertension secondary to endocrine disorders: Secondary | ICD-10-CM

## 2016-03-24 DIAGNOSIS — E118 Type 2 diabetes mellitus with unspecified complications: Secondary | ICD-10-CM

## 2016-03-24 DIAGNOSIS — J438 Other emphysema: Secondary | ICD-10-CM | POA: Diagnosis not present

## 2016-03-24 DIAGNOSIS — E1159 Type 2 diabetes mellitus with other circulatory complications: Secondary | ICD-10-CM | POA: Diagnosis not present

## 2016-03-24 DIAGNOSIS — I10 Essential (primary) hypertension: Secondary | ICD-10-CM

## 2016-03-24 DIAGNOSIS — H6123 Impacted cerumen, bilateral: Secondary | ICD-10-CM | POA: Diagnosis not present

## 2016-03-24 DIAGNOSIS — E1169 Type 2 diabetes mellitus with other specified complication: Secondary | ICD-10-CM

## 2016-03-24 DIAGNOSIS — E785 Hyperlipidemia, unspecified: Secondary | ICD-10-CM

## 2016-03-24 DIAGNOSIS — F17200 Nicotine dependence, unspecified, uncomplicated: Secondary | ICD-10-CM | POA: Diagnosis not present

## 2016-03-24 LAB — POCT GLYCOSYLATED HEMOGLOBIN (HGB A1C): HEMOGLOBIN A1C: 6.2

## 2016-03-24 NOTE — Progress Notes (Addendum)
  Subjective:    Patient ID: Donald Richardson, male    DOB: April 03, 1940, 76 y.o.   MRN: [id number]  Donald Richardson is a 76 y.o. male who presents for follow-up of Type 2 diabetes mellitus.  Home blood sugar records: Not checking them Current symptoms/problems include none and have been unchanged. Daily foot checks:   Any foot concerns: None none Exercise: The patient does not participate in regular exercise at present. Diet:none He continues on lisinopril, Plavix and Crestor. He continues to smoke and has no desire to quit he's had no chest pain, shortness of breath, PND or DOE. He remains active but is not involved in any regular exercise program. He continues to be followed by cardiology. He has been having difficulty hearing. The following portions of the patient's history were reviewed and updated as appropriate: allergies, current medications, past medical history, past social history and problem list.  ROS as in subjective above.     Objective:    Physical Exam Alert and in no distress . Cerumen was removed from both ears by  lavage and with instrumentation. The TMs and canals are normal.  Blood pressure 132/80, pulse 60, resp. rate 16, height 5\' 8"  (1.727 m), weight 152 lb 9.6 oz (69.2 kg).  Lab Review Diabetic Labs Latest Ref Rng & Units 10/23/2015 09/18/2015 08/29/2015 07/27/2015 07/19/2015  HbA1c - - 5.8 - - 6.3(H)  Microalbumin mg/dL - - - - -  Micro/Creat Ratio - - - - - -  Chol 125 - 200 mg/dL - - - - 112(L)  HDL >=40 mg/dL - - - - 33(L)  Calc LDL <130 mg/dL - - - - 59  Triglycerides <150 mg/dL - - - - 98  Creatinine 0.61 - 1.24 mg/dL 1.39(H) - 1.47(H) 1.62(H) 1.82(H)  GFR >60.00 mL/min - - - - -   BP/Weight 03/24/2016 10/26/2015 09/18/2015 07/19/2015 [id number]  Systolic BP Q000111Q Q000111Q [id number]  Diastolic BP 80 71 68 68 68  Wt. (Lbs) 152.6 156 156.4 151.12 155  BMI 23.2 23.72 23.79 22.98 23.57   Foot/eye exam completion dates Latest Ref Rng & Units 08/17/2015 08/16/2014  Eye Exam  No Retinopathy No Retinopathy No Retinopathy  Foot Form Completion - - -  A1c is 6.3  Carlen  reports that he has been smoking Cigarettes.  He has been smoking about 1.00 pack per day. He has never used smokeless tobacco. He reports that he does not drink alcohol or use drugs.     Assessment & Plan:    Controlled type 2 diabetes mellitus with complication, without long-term current use of insulin ([place]) - Plan: HgB A1c  1. Rx changes: none 2. Education: Reviewed 'ABCs' of diabetes management (respective goals in parentheses):  A1C (<7), blood pressure (<130/80), and cholesterol (LDL <100). 3. Compliance at present is estimated to be fair. Efforts to improve compliance (if necessary) will be directed at . 4. Follow up: 6 months. He seems be doing quite well and very stable

## 2016-06-20 ENCOUNTER — Emergency Department (HOSPITAL_COMMUNITY): Payer: Medicare Other

## 2016-06-20 ENCOUNTER — Inpatient Hospital Stay (HOSPITAL_COMMUNITY)
Admission: EM | Admit: 2016-06-20 | Discharge: 2016-06-24 | DRG: 065 | Disposition: A | Discharge status: Rehabilitation Facility | Payer: Medicare Other | Attending: Internal Medicine | Admitting: Internal Medicine

## 2016-06-20 ENCOUNTER — Telehealth: Payer: Self-pay | Admitting: Internal Medicine

## 2016-06-20 ENCOUNTER — Encounter (HOSPITAL_COMMUNITY): Payer: Self-pay | Admitting: Nurse Practitioner

## 2016-06-20 DIAGNOSIS — E1151 Type 2 diabetes mellitus with diabetic peripheral angiopathy without gangrene: Secondary | ICD-10-CM | POA: Diagnosis not present

## 2016-06-20 DIAGNOSIS — F17200 Nicotine dependence, unspecified, uncomplicated: Secondary | ICD-10-CM | POA: Diagnosis present

## 2016-06-20 DIAGNOSIS — R531 Weakness: Secondary | ICD-10-CM | POA: Diagnosis not present

## 2016-06-20 DIAGNOSIS — R251 Tremor, unspecified: Secondary | ICD-10-CM | POA: Diagnosis present

## 2016-06-20 DIAGNOSIS — G8194 Hemiplegia, unspecified affecting left nondominant side: Secondary | ICD-10-CM | POA: Diagnosis not present

## 2016-06-20 DIAGNOSIS — I69398 Other sequelae of cerebral infarction: Secondary | ICD-10-CM | POA: Diagnosis not present

## 2016-06-20 DIAGNOSIS — N183 Chronic kidney disease, stage 3 unspecified: Secondary | ICD-10-CM | POA: Diagnosis present

## 2016-06-20 DIAGNOSIS — I4510 Unspecified right bundle-branch block: Secondary | ICD-10-CM | POA: Diagnosis present

## 2016-06-20 DIAGNOSIS — J449 Chronic obstructive pulmonary disease, unspecified: Secondary | ICD-10-CM | POA: Diagnosis not present

## 2016-06-20 DIAGNOSIS — R918 Other nonspecific abnormal finding of lung field: Secondary | ICD-10-CM | POA: Diagnosis not present

## 2016-06-20 DIAGNOSIS — E8809 Other disorders of plasma-protein metabolism, not elsewhere classified: Secondary | ICD-10-CM | POA: Diagnosis present

## 2016-06-20 DIAGNOSIS — I10 Essential (primary) hypertension: Secondary | ICD-10-CM | POA: Diagnosis not present

## 2016-06-20 DIAGNOSIS — I63311 Cerebral infarction due to thrombosis of right middle cerebral artery: Secondary | ICD-10-CM | POA: Diagnosis not present

## 2016-06-20 DIAGNOSIS — Z820 Family history of epilepsy and other diseases of the nervous system: Secondary | ICD-10-CM

## 2016-06-20 DIAGNOSIS — E1122 Type 2 diabetes mellitus with diabetic chronic kidney disease: Secondary | ICD-10-CM | POA: Diagnosis present

## 2016-06-20 DIAGNOSIS — I6339 Cerebral infarction due to thrombosis of other cerebral artery: Secondary | ICD-10-CM

## 2016-06-20 DIAGNOSIS — I69354 Hemiplegia and hemiparesis following cerebral infarction affecting left non-dominant side: Secondary | ICD-10-CM | POA: Diagnosis not present

## 2016-06-20 DIAGNOSIS — E118 Type 2 diabetes mellitus with unspecified complications: Secondary | ICD-10-CM | POA: Diagnosis present

## 2016-06-20 DIAGNOSIS — Z955 Presence of coronary angioplasty implant and graft: Secondary | ICD-10-CM

## 2016-06-20 DIAGNOSIS — Z7982 Long term (current) use of aspirin: Secondary | ICD-10-CM

## 2016-06-20 DIAGNOSIS — R001 Bradycardia, unspecified: Secondary | ICD-10-CM | POA: Diagnosis present

## 2016-06-20 DIAGNOSIS — E785 Hyperlipidemia, unspecified: Secondary | ICD-10-CM | POA: Diagnosis present

## 2016-06-20 DIAGNOSIS — I2510 Atherosclerotic heart disease of native coronary artery without angina pectoris: Secondary | ICD-10-CM | POA: Diagnosis present

## 2016-06-20 DIAGNOSIS — D696 Thrombocytopenia, unspecified: Secondary | ICD-10-CM | POA: Diagnosis not present

## 2016-06-20 DIAGNOSIS — Z7902 Long term (current) use of antithrombotics/antiplatelets: Secondary | ICD-10-CM | POA: Diagnosis not present

## 2016-06-20 DIAGNOSIS — F17210 Nicotine dependence, cigarettes, uncomplicated: Secondary | ICD-10-CM | POA: Diagnosis present

## 2016-06-20 DIAGNOSIS — Z803 Family history of malignant neoplasm of breast: Secondary | ICD-10-CM

## 2016-06-20 DIAGNOSIS — I129 Hypertensive chronic kidney disease with stage 1 through stage 4 chronic kidney disease, or unspecified chronic kidney disease: Secondary | ICD-10-CM | POA: Diagnosis present

## 2016-06-20 DIAGNOSIS — Z79899 Other long term (current) drug therapy: Secondary | ICD-10-CM | POA: Diagnosis not present

## 2016-06-20 DIAGNOSIS — E119 Type 2 diabetes mellitus without complications: Secondary | ICD-10-CM | POA: Diagnosis present

## 2016-06-20 DIAGNOSIS — I639 Cerebral infarction, unspecified: Secondary | ICD-10-CM | POA: Diagnosis not present

## 2016-06-20 DIAGNOSIS — R269 Unspecified abnormalities of gait and mobility: Secondary | ICD-10-CM | POA: Diagnosis not present

## 2016-06-20 DIAGNOSIS — I6381 Other cerebral infarction due to occlusion or stenosis of small artery: Secondary | ICD-10-CM | POA: Diagnosis present

## 2016-06-20 DIAGNOSIS — E1159 Type 2 diabetes mellitus with other circulatory complications: Secondary | ICD-10-CM | POA: Diagnosis present

## 2016-06-20 DIAGNOSIS — I5043 Acute on chronic combined systolic (congestive) and diastolic (congestive) heart failure: Secondary | ICD-10-CM | POA: Diagnosis not present

## 2016-06-20 DIAGNOSIS — I679 Cerebrovascular disease, unspecified: Secondary | ICD-10-CM | POA: Diagnosis not present

## 2016-06-20 DIAGNOSIS — R2689 Other abnormalities of gait and mobility: Secondary | ICD-10-CM | POA: Diagnosis not present

## 2016-06-20 LAB — COMPREHENSIVE METABOLIC PANEL
ALBUMIN: 3.7 g/dL (ref 3.5–5.0)
ALT: 12 U/L — ABNORMAL LOW (ref 17–63)
ANION GAP: 11 (ref 5–15)
AST: 20 U/L (ref 15–41)
Alkaline Phosphatase: 79 U/L (ref 38–126)
BILIRUBIN TOTAL: 0.9 mg/dL (ref 0.3–1.2)
BUN: 18 mg/dL (ref 6–20)
CHLORIDE: 104 mmol/L (ref 101–111)
CO2: 23 mmol/L (ref 22–32)
Calcium: 8.8 mg/dL — ABNORMAL LOW (ref 8.9–10.3)
Creatinine, Ser: 1.4 mg/dL — ABNORMAL HIGH (ref 0.61–1.24)
GFR calc Af Amer: 56 mL/min — ABNORMAL LOW (ref >60)
GFR calc non Af Amer: 48 mL/min — ABNORMAL LOW (ref >60)
GLUCOSE: 115 mg/dL — AB (ref 65–99)
POTASSIUM: 3.7 mmol/L (ref 3.5–5.1)
SODIUM: 138 mmol/L (ref 135–145)
TOTAL PROTEIN: 6.6 g/dL (ref 6.5–8.1)

## 2016-06-20 LAB — I-STAT CHEM 8, ED
BUN: 21 mg/dL — AB (ref 6–20)
Calcium, Ion: 1.07 mmol/L — ABNORMAL LOW (ref 1.15–1.40)
Chloride: 104 mmol/L (ref 101–111)
Creatinine, Ser: 1.4 mg/dL — ABNORMAL HIGH (ref 0.61–1.24)
Glucose, Bld: 111 mg/dL — ABNORMAL HIGH (ref 65–99)
HEMATOCRIT: 46 % (ref 39.0–52.0)
Hemoglobin: 15.6 g/dL (ref 13.0–17.0)
Potassium: 3.6 mmol/L (ref 3.5–5.1)
SODIUM: 140 mmol/L (ref 135–145)
TCO2: 25 mmol/L (ref 0–100)

## 2016-06-20 LAB — CBC
HCT: 45.8 % (ref 39.0–52.0)
Hemoglobin: 15.3 g/dL (ref 13.0–17.0)
MCH: 29.9 pg (ref 26.0–34.0)
MCHC: 33.4 g/dL (ref 30.0–36.0)
MCV: 89.6 fL (ref 78.0–100.0)
PLATELETS: 117 10*3/uL — AB (ref 150–400)
RBC: 5.11 MIL/uL (ref 4.22–5.81)
RDW: 14.3 % (ref 11.5–15.5)
WBC: 10.2 10*3/uL (ref 4.0–10.5)

## 2016-06-20 LAB — DIFFERENTIAL
BASOS PCT: 0 %
Basophils Absolute: 0 10*3/uL (ref 0.0–0.1)
EOS ABS: 0.1 10*3/uL (ref 0.0–0.7)
EOS PCT: 1 %
Lymphocytes Relative: 10 %
Lymphs Abs: 1 10*3/uL (ref 0.7–4.0)
Monocytes Absolute: 1.2 10*3/uL — ABNORMAL HIGH (ref 0.1–1.0)
Monocytes Relative: 12 %
NEUTROS PCT: 77 %
Neutro Abs: 7.9 10*3/uL — ABNORMAL HIGH (ref 1.7–7.7)

## 2016-06-20 LAB — APTT: APTT: 28 s (ref 24–36)

## 2016-06-20 LAB — I-STAT TROPONIN, ED: Troponin i, poc: 0.01 ng/mL (ref 0.00–0.08)

## 2016-06-20 LAB — PROTIME-INR
INR: 1.08
PROTHROMBIN TIME: 14 s (ref 11.4–15.2)

## 2016-06-20 MED ORDER — IPRATROPIUM-ALBUTEROL 0.5-2.5 (3) MG/3ML IN SOLN
3.0000 mL | RESPIRATORY_TRACT | Status: DC | PRN
Start: 2016-06-20 — End: 2016-06-24

## 2016-06-20 MED ORDER — ACETAMINOPHEN 325 MG PO TABS: 650.0000 mg | ORAL_TABLET | ORAL | Status: DC | PRN

## 2016-06-20 MED ORDER — ROSUVASTATIN CALCIUM 5 MG PO TABS
20.0000 mg | ORAL_TABLET | Freq: Every day | ORAL | Status: DC
  Administered 2016-06-21 – 2016-06-23 (×4): 20 mg via ORAL
  Filled 2016-06-20: qty 4
  Filled 2016-06-20: qty 1
  Filled 2016-06-20 (×3): qty 4

## 2016-06-20 MED ORDER — ENOXAPARIN SODIUM 40 MG/0.4ML ~~LOC~~ SOLN
40.0000 mg | SUBCUTANEOUS | Status: DC
  Administered 2016-06-21 – 2016-06-24 (×4): 40 mg via SUBCUTANEOUS
  Filled 2016-06-20 (×4): qty 0.4

## 2016-06-20 MED ORDER — LISINOPRIL 10 MG PO TABS
10.0000 mg | ORAL_TABLET | Freq: Every day | ORAL | Status: DC
Start: 2016-06-21 — End: 2016-06-21
  Administered 2016-06-21: 10 mg via ORAL
  Filled 2016-06-20: qty 1

## 2016-06-20 MED ORDER — ACETAMINOPHEN 160 MG/5ML PO SOLN: 650.0000 mg | ORAL | Status: DC | PRN

## 2016-06-20 MED ORDER — STROKE: EARLY STAGES OF RECOVERY BOOK
Freq: Once | Status: DC
  Filled 2016-06-20: qty 1

## 2016-06-20 MED ORDER — SENNOSIDES-DOCUSATE SODIUM 8.6-50 MG PO TABS
1.0000 | ORAL_TABLET | Freq: Every evening | ORAL | Status: DC | PRN
  Filled 2016-06-20: qty 1

## 2016-06-20 MED ORDER — ACETAMINOPHEN 650 MG RE SUPP: 650.0000 mg | RECTAL | Status: DC | PRN

## 2016-06-20 MED ORDER — SODIUM CHLORIDE 0.9 % IV SOLN
INTRAVENOUS | Status: DC
  Administered 2016-06-21: 02:00:00 via INTRAVENOUS

## 2016-06-20 MED ORDER — CLOPIDOGREL BISULFATE 75 MG PO TABS
75.0000 mg | ORAL_TABLET | Freq: Every day | ORAL | Status: DC
Start: 2016-06-21 — End: 2016-06-24
  Administered 2016-06-21 – 2016-06-24 (×4): 75 mg via ORAL
  Filled 2016-06-20 (×4): qty 1

## 2016-06-20 MED ORDER — NICOTINE 21 MG/24HR TD PT24: 21.0000 mg | MEDICATED_PATCH | Freq: Every day | TRANSDERMAL | Status: DC | PRN

## 2016-06-20 MED ORDER — ASPIRIN EC 81 MG PO TBEC: 81.0000 mg | DELAYED_RELEASE_TABLET | Freq: Every day | ORAL | Status: DC

## 2016-06-20 NOTE — ED Triage Notes (Signed)
Pt presents with c/o weakness. The weakness began on Wednesday. He woke Wednesday not feeling well and having weakness in the left side of his body. The weakness has not improved since onset and he continues to feel especially weak in his left leg. He has fallen twice, Thursday and today. He lives at home alone and when visitors came today they found him in the driveway unable to walk. He denies any LOC, changes in speech, pain, SOB, chest pain, nausea, vomiting.

## 2016-06-20 NOTE — Telephone Encounter (Signed)
Attempted to call patient.  No aswer.   Per chart patient is in the ED for evaluation.

## 2016-06-20 NOTE — ED Provider Notes (Signed)
[facility] DEPT Provider Note   CSN: [id number] Arrival date & time: 06/20/16  1741     History   Chief Complaint Chief Complaint  Patient presents with  . Weakness    HPI Donald Richardson is a 76 y.o. male.  He presents for evaluation of weakness, left-sided, and decreasing ability to walk, starting 2 days ago.  Patient reports the symptom is static, unchanging.  He denies headache, fever, chills, nausea, vomiting, paresthesia, vertigo, chest pain or shortness of breath.  Today he was out driving, and felt bad.  A neighbor, met him in his driveway, and helped him to a chair.  He sat there, about an hour then some friends brought him here for evaluation.  No prior similar problem.  He has had a couple falls, typically going forward, injuring his chin and his left arm.  He denies leg pain.  There are no other known modifying factors.  HPI  Past Medical History:  Diagnosis Date  . ASHD (arteriosclerotic heart disease)    STENT  . COPD (chronic obstructive pulmonary disease) ([place])   . Diabetes mellitus    Hgb A1C- 5.8 in May 2017  . Hemorrhoids   . Hyperlipidemia   . Hypertension   . Smoker     Patient Active Problem List   Diagnosis Date Noted  . H/O cataract extraction 06/26/2014  . COLD (chronic obstructive lung disease) ([place]) 02/27/2014  . Hypertension associated with diabetes ([place]) 06/16/2012  . Diabetes mellitus type 2, controlled, with complications ([place]) 36/46/8032  . Hyperlipidemia associated with type 2 diabetes mellitus ([place]) 08/14/2008  . TOBACCO ABUSE 08/14/2008  . CAD, NATIVE VESSEL 08/14/2008  . Bradycardia 08/14/2008    Past Surgical History:  Procedure Laterality Date  . INGUINAL HERNIA REPAIR Left 10/26/2015   Procedure: LEFT INGUINAL HERNIA REPAIR WITH MESH;  Surgeon: Jackolyn Confer, MD;  Location: [facility] ORS;  Service: General;  Laterality: Left;  . INSERTION OF MESH Left 10/26/2015   Procedure: INSERTION OF MESH;  Surgeon: Jackolyn Confer, MD;   Location: [facility] ORS;  Service: General;  Laterality: Left;  . NO PAST SURGERIES         Home Medications    Prior to Admission medications   Medication Sig Start Date End Date Taking? Authorizing Provider  aspirin EC 81 MG tablet Take 81 mg by mouth daily.   Yes Historical Provider, MD  clopidogrel (PLAVIX) 75 MG tablet Take 75 mg by mouth daily.  02/20/16  Yes Historical Provider, MD  lisinopril (PRINIVIL,ZESTRIL) 10 MG tablet Take 1 tablet (10 mg total) by mouth daily. 08/08/15  Yes Fay Records, MD  rosuvastatin (CRESTOR) 40 MG tablet Take 1 tablet (40 mg total) by mouth daily. Patient taking differently: Take 20 mg by mouth at bedtime.  10/24/15  Yes Fay Records, MD    Family History Family History  Problem Relation Age of Onset  . Breast cancer Mother   . Parkinson's disease Father     Social History Social History  Substance Use Topics  . Smoking status: Current Every Day Smoker    Packs/day: 1.00    Types: Cigarettes  . Smokeless tobacco: Never Used  . Alcohol use No     Allergies   Patient has no known allergies.   Review of Systems Review of Systems  All other systems reviewed and are negative.    Physical Exam Updated Vital Signs BP (!) 173/83   Pulse (!) 46   Temp 97.7 F (36.5 C) (Oral)  Resp 16   SpO2 96%   Physical Exam  Constitutional: He is oriented to person, place, and time. He appears well-developed.  Elderly, frail  HENT:  Head: Normocephalic and atraumatic.  Right Ear: External ear normal.  Left Ear: External ear normal.  Eyes: Conjunctivae and EOM are normal. Pupils are equal, round, and reactive to light.  Neck: Normal range of motion and phonation normal. Neck supple.  Cardiovascular: Normal rate, regular rhythm and normal heart sounds.   Pulmonary/Chest: Effort normal and breath sounds normal. He exhibits no bony tenderness.  Abdominal: Soft. There is no tenderness.  Musculoskeletal: Normal range of motion.  Neurological: He is  alert and oriented to person, place, and time. No cranial nerve deficit or sensory deficit. He exhibits normal muscle tone. Coordination normal.  No dysarthria and aphasia or nystagmus.  Mild left hand dysmetria.  No abnormal heel-to-shin movement.  Mild right hand tremor.  Normal strength arms and legs bilaterally.  Skin: Skin is warm, dry and intact.  Psychiatric: He has a normal mood and affect. His behavior is normal. Judgment and thought content normal.  Nursing note and vitals reviewed.    ED Treatments / Results  Labs (all labs ordered are listed, but only abnormal results are displayed) Labs Reviewed  CBC - Abnormal; Notable for the following:       Result Value   Platelets 117 (*)    All other components within normal limits  DIFFERENTIAL - Abnormal; Notable for the following:    Neutro Abs 7.9 (*)    Monocytes Absolute 1.2 (*)    All other components within normal limits  COMPREHENSIVE METABOLIC PANEL - Abnormal; Notable for the following:    Glucose, Bld 115 (*)    Creatinine, Ser 1.40 (*)    Calcium 8.8 (*)    ALT 12 (*)    GFR calc non Af Amer 48 (*)    GFR calc Af Amer 56 (*)    All other components within normal limits  I-STAT CHEM 8, ED - Abnormal; Notable for the following:    BUN 21 (*)    Creatinine, Ser 1.40 (*)    Glucose, Bld 111 (*)    Calcium, Ion 1.07 (*)    All other components within normal limits  PROTIME-INR  APTT  I-STAT TROPOININ, ED  CBG MONITORING, ED    EKG  EKG Interpretation  Date/Time:  Friday June 20 2016 17:51:33 EDT Ventricular Rate:  55 PR Interval:  152 QRS Duration: 140 QT Interval:  462 QTC Calculation: 441 R Axis:   35 Text Interpretation:  Sinus bradycardia with sinus arrhythmia Right bundle branch block Possible Lateral infarct , age undetermined Inferior infarct , age undetermined Abnormal ECG Since last tracing rate slower and Right bundle branch block is new Confirmed by Eulis Foster  MD, Cloy Cozzens [phone]) on 06/20/2016  8:18:51 PM       Radiology Ct Head Wo Contrast  Result Date: 06/20/2016 CLINICAL DATA:  Left-sided weakness since Wednesday EXAM: CT HEAD WITHOUT CONTRAST TECHNIQUE: Contiguous axial images were obtained from the base of the skull through the vertex without intravenous contrast. COMPARISON:  09/22/2007 FINDINGS: Brain: No territorial infarct is visualized. There is moderate atrophy. Periventricular white matter hypodensity consistent with small vessel ischemic change. Old appearing infarcts in the left basal ganglia. 9 mm mildly dense focus near the head of left caudate. Not much surrounding edema or mass effect. The ventricles are slightly enlarged, likely related to atrophy. Vascular: Vertebral artery and basilar artery  calcification. Carotid artery calcification. No hyperdense vessels. Skull: No fracture or suspicious bone lesion. Sinuses/Orbits: Mucosal thickening in the maxillary and ethmoid sinuses. No acute orbital abnormality. Bilateral lens extraction. Other: None IMPRESSION: 1. 9 mm hyperdense focus near the head of the left caudate ; on certain if this represents mild calcification, sequelae of prior hemorrhage, or a small hyperdense mass. No significant surrounding edema or mass effect. MRI is recommended for further evaluation. 2. Atrophy and is bilateral white matter small vessel ischemic changes with old infarcts in the left basal ganglia. Electronically Signed   By: Donavan Foil M.D.   On: 06/20/2016 18:53   Mr Angiogram Head Wo Contrast  Result Date: 06/20/2016 CLINICAL DATA:  Left-sided weakness and difficulty walking EXAM: MRI HEAD WITHOUT CONTRAST MRA HEAD WITHOUT CONTRAST TECHNIQUE: Multiplanar, multiecho pulse sequences of the brain and surrounding structures were obtained without intravenous contrast. Angiographic images of the head were obtained using MRA technique without contrast. COMPARISON:  Head CT 06/20/2016 FINDINGS: MRI HEAD FINDINGS Brain: There is focal diffusion  restriction within the tail of the right caudate, extending inferiorly near the posterior limb of the right internal capsule. No other diffusion restriction. There is beginning confluent hyperintense T2-weighted signal within the periventricular white matter, most often seen in the setting of chronic microvascular ischemia. There is an enlarged perivascular space of the left lentiform nucleus. 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 There is a focus of chronic microhemorrhage in the left occipital lobe. No hydrocephalus or extra-axial fluid collection. The midline structures are normal. There is age advanced atrophy without lobar predilection. Skull and upper cervical spine: The visualized skull base, calvarium, upper cervical spine and extracranial soft tissues are normal. Sinuses/Orbits: No fluid levels or advanced mucosal thickening. No mastoid effusion. Normal orbits. MRA HEAD FINDINGS Intracranial internal carotid arteries: Normal. Anterior cerebral arteries: Normal. Middle cerebral arteries: Normal. Posterior communicating arteries: Absent bilaterally. Posterior cerebral arteries: Normal. Basilar artery: Normal. Vertebral arteries: Left dominant. Normal. Superior cerebellar arteries: Duplicated bilaterally. Anterior inferior cerebellar arteries: Normal. Posterior inferior cerebellar arteries: Normal. IMPRESSION: 1. Acute infarct of the right caudate tail, extending inferiorly along the posterior limb of the right internal capsule. This is in keeping with the reported left-sided weakness. 2. No acute hemorrhage or mass effect. 3. 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 4. Normal intracranial MRA. 5. Chronic microvascular ischemia and age advanced atrophy. Electronically Signed   By: Ulyses Jarred M.D.   On: 06/20/2016 22:18   Mr Brain Wo  Contrast  Result Date: 06/20/2016 CLINICAL DATA:  Left-sided weakness and difficulty walking EXAM: MRI HEAD WITHOUT CONTRAST MRA HEAD WITHOUT CONTRAST TECHNIQUE: Multiplanar, multiecho pulse sequences of the brain and surrounding structures were obtained without intravenous contrast. Angiographic images of the head were obtained using MRA technique without contrast. COMPARISON:  Head CT 06/20/2016 FINDINGS: MRI HEAD FINDINGS Brain: There is focal diffusion restriction within the tail of the right caudate, extending inferiorly near the posterior limb of the right internal capsule. No other diffusion restriction. There is beginning confluent hyperintense T2-weighted signal within the periventricular white matter, most often seen in the setting of chronic microvascular ischemia. There is an enlarged perivascular space of the left lentiform nucleus. 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 There is a focus of chronic microhemorrhage in the left occipital lobe. No hydrocephalus or extra-axial fluid collection. The midline structures are normal. There is age advanced atrophy without lobar predilection. Skull and upper cervical spine: The visualized skull base, calvarium, upper cervical spine and extracranial soft tissues are normal. Sinuses/Orbits: No fluid levels or advanced mucosal thickening. No mastoid effusion. Normal orbits. MRA HEAD FINDINGS Intracranial internal carotid arteries: Normal. Anterior cerebral arteries: Normal. Middle cerebral arteries: Normal. Posterior communicating arteries: Absent bilaterally. Posterior cerebral arteries: Normal. Basilar artery: Normal. Vertebral arteries: Left dominant. Normal. Superior cerebellar arteries: Duplicated bilaterally. Anterior inferior cerebellar arteries: Normal. Posterior inferior cerebellar arteries: Normal. IMPRESSION: 1. Acute infarct of the right caudate tail,  extending inferiorly along the posterior limb of the right internal capsule. This is in keeping with the reported left-sided weakness. 2. No acute hemorrhage or mass effect. 3. 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 4. Normal intracranial MRA. 5. Chronic microvascular ischemia and age advanced atrophy. Electronically Signed   By: Ulyses Jarred M.D.   On: 06/20/2016 22:18    Procedures Procedures (including critical care time)  Medications Ordered in ED Medications - No data to display   Initial Impression / Assessment and Plan / ED Course  I have reviewed the triage vital signs and the nursing notes.  Pertinent labs & imaging results that were available during my care of the patient were reviewed by me and considered in my medical decision making (see chart for details).     Medications - No data to display  Patient Vitals for the past 24 hrs:  BP Temp Temp src Pulse Resp SpO2  06/20/16 2230 (!) 173/83 - - (!) 46 16 96 %  06/20/16 2115 (!) 162/82 - - (!) 48 16 97 %  06/20/16 2100 (!) 164/67 - - (!) 45 15 97 %  06/20/16 2045 (!) 171/87 - - (!) 56 15 94 %  06/20/16 2030 (!) 160/73 - - (!) 53 10 98 %  06/20/16 1754 (!) 155/95 97.7 F (36.5 C) Oral 65 16 95 %    10:46 PM Reevaluation with update and discussion. After initial assessment and treatment, an updated evaluation reveals clinical exam unchanged.  He is calm and comfortable.  Findings discussed with patient and friends with him, all questions answered. , L    Case discussed with neuro hospitalist, for consultation purposes.  Case discussed with hospitalist, for admission.  Final Clinical Impressions(s) / ED Diagnoses   Final diagnoses:  Cerebrovascular accident (CVA), unspecified mechanism ([place])   Acute CVA, out of the window for intervention with primary neurologic abnormalities including left-sided weakness and dysmetria. VAN screening  is negative.  He will require admission for further evaluation and treatment.  Nursing Notes Reviewed/ Care Coordinated Applicable Imaging Reviewed Interpretation of Laboratory Data incorporated into ED treatment  Plan: Admit  New Prescriptions New Prescriptions   No medications on file     Daleen Bo, MD 06/20/16 2338

## 2016-06-20 NOTE — Consult Note (Addendum)
Admission H&P    Chief Complaint: New onset left-sided weakness.  HPI: Donald Richardson is an 76 y.o. male history of hypertension and hyperlipidemia was chronic heart disease, COPD and equivocal history of diabetes mellitus, stenting with weakness and sensory changes involving left extremities for 2 days. Onset of symptoms was around 6 PM on 06/18/2016. He has no previous history of stroke nor TIA. He's been taking aspirin and Plavix daily. 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 No sensory changes been noted and he also has not noted droop. Primary weakness is involved his left leg and he's fallen multiple times. He has a known resting tremor and reportedly has developed shuffling gait and stooped posture. He has not been treated at any point for Parkinson's disease.  LSN: 6 PM on 06/18/2016 tPA Given: No: Beyond time under for treatment consideration mRankin:  Past Medical History:  Diagnosis Date  . ASHD (arteriosclerotic heart disease)    STENT  . COPD (chronic obstructive pulmonary disease) ([place])   . Diabetes mellitus    Hgb A1C- 5.8 in May 2017  . Hemorrhoids   . Hyperlipidemia   . Hypertension   . Smoker     Past Surgical History:  Procedure Laterality Date  . INGUINAL HERNIA REPAIR Left 10/26/2015   Procedure: LEFT INGUINAL HERNIA REPAIR WITH MESH;  Surgeon: Jackolyn Confer, MD;  Location: [facility];  Service: General;  Laterality: Left;  . INSERTION OF MESH Left 10/26/2015   Procedure: INSERTION OF MESH;  Surgeon: Jackolyn Confer, MD;  Location: [facility];  Service: General;  Laterality: Left;  . NO PAST SURGERIES      Family History  Problem Relation Age of Onset  . Breast cancer Mother   . Parkinson's disease Father    Social History:  reports that he has been smoking Cigarettes.  He has been smoking about 1.00 pack per day. He has never used smokeless tobacco. He reports that he does not  drink alcohol or use drugs.  Allergies: No Known Allergies  Medications: Preadmission medications were reviewed by me.  ROS: History obtained from chart review and the patient  General ROS: negative for - chills, fatigue, fever, night sweats, weight gain or weight loss Psychological ROS: negative for - behavioral disorder, hallucinations, memory difficulties, mood swings or suicidal ideation Ophthalmic ROS: negative for - blurry vision, double vision, eye pain or loss of vision ENT ROS: negative for - epistaxis, nasal discharge, oral lesions, sore throat, tinnitus or vertigo Allergy and Immunology ROS: negative for - hives or itchy/watery eyes Hematological and Lymphatic ROS: negative for - bleeding problems, bruising or swollen lymph nodes Endocrine ROS: negative for - galactorrhea, hair pattern changes, polydipsia/polyuria or temperature intolerance Respiratory ROS: negative for - cough, hemoptysis, shortness of breath or wheezing Cardiovascular ROS: negative for - chest pain, dyspnea on exertion, edema or irregular heartbeat Gastrointestinal ROS: negative for - abdominal pain, diarrhea, hematemesis, nausea/vomiting or stool incontinence Genito-Urinary ROS: negative for - dysuria, hematuria, incontinence or urinary frequency/urgency Musculoskeletal ROS: negative for - joint swelling or muscular weakness Neurological ROS: as noted in HPI, including stooped posture, shuffling gait and right upper extremity tremor Dermatological ROS: negative for rash and skin lesion changes  Physical Examination: Blood pressure (!) 173/83, pulse (!) 46, temperature 97.7 F (36.5 C), temperature source Oral, resp. rate 16, SpO2 96 %.  HEENT-  Normocephalic, no lesions, without obvious abnormality.  Normal external eye and conjunctiva.  Normal TM's bilaterally.  Normal auditory canals and external ears. Normal external nose, mucus membranes and septum.  Normal pharynx. Neck supple with no masses, nodes,  nodules or enlargement. Cardiovascular - regular rate and rhythm, S1, S2 normal, no murmur, click, rub or gallop Lungs - chest clear, no wheezing, rales, normal symmetric air entry Abdomen - soft, non-tender; bowel sounds normal; no masses,  no organomegaly Extremities - no joint deformities, effusion, or inflammation and no edema  Neurologic Examination: Mental Status: Alert, oriented, thought content appropriate.  Speech fluent without evidence of aphasia. Able to follow commands without difficulty. Cranial Nerves: II-Visual fields were normal. III/IV/VI-Pupils were equal and reacted normally to light. Extraocular movements were full and conjugate.    V/VII-no facial numbness and no facial weakness. VIII-normal. X-normal speech and symmetrical palatal movement. Motor: Minimal drift of left upper extremity; motor exam otherwise unremarkable, including proximal strength of left lower extremity. Resting tremor of distal right upper extremity was noted along with increased tone in right arm. Sensory: Normal throughout. Deep Tendon Reflexes: 2+ and symmetric. Plantars: Flexor bilaterally Cerebellar: Slightly impaired coordination with finger-to-nose testing of left upper extremity; normal heel to shin testing with both lower extremities. Carotid auscultation: Normal  Results for orders placed or performed during the hospital encounter of 06/20/16 (from the past 48 hour(s))  Protime-INR     Status: None   Collection Time: 06/20/16  6:04 PM  Result Value Ref Range   Prothrombin Time 14.0 11.4 - 15.2 seconds   INR 1.08   APTT     Status: None   Collection Time: 06/20/16  6:04 PM  Result Value Ref Range   aPTT 28 24 - 36 seconds  CBC     Status: Abnormal   Collection Time: 06/20/16  6:04 PM  Result Value Ref Range   WBC 10.2 4.0 - 10.5 K/uL   RBC 5.11 4.22 - 5.81 MIL/uL   Hemoglobin 15.3 13.0 - 17.0 g/dL   HCT 45.8 39.0 - 52.0 %   MCV 89.6 78.0 - 100.0 fL   MCH 29.9 26.0 - 34.0 pg    MCHC 33.4 30.0 - 36.0 g/dL   RDW 14.3 11.5 - 15.5 %   Platelets 117 (L) 150 - 400 K/uL    Comment: REPEATED TO VERIFY SPECIMEN CHECKED FOR CLOTS PLATELETS APPEAR DECREASED PLATELET COUNT CONFIRMED BY SMEAR LARGE PLATELETS PRESENT   Differential     Status: Abnormal   Collection Time: 06/20/16  6:04 PM  Result Value Ref Range   Neutrophils Relative % 77 %   Neutro Abs 7.9 (H) 1.7 - 7.7 K/uL   Lymphocytes Relative 10 %   Lymphs Abs 1.0 0.7 - 4.0 K/uL   Monocytes Relative 12 %   Monocytes Absolute 1.2 (H) 0.1 - 1.0 K/uL   Eosinophils Relative 1 %   Eosinophils Absolute 0.1 0.0 - 0.7 K/uL   Basophils Relative 0 %   Basophils Absolute 0.0 0.0 - 0.1 K/uL  Comprehensive metabolic panel     Status: Abnormal   Collection Time: 06/20/16  6:04 PM  Result Value Ref Range   Sodium 138 135 - 145 mmol/L   Potassium 3.7 3.5 - 5.1 mmol/L   Chloride 104 101 - 111 mmol/L   CO2 23 22 - 32 mmol/L   Glucose, Bld 115 (H) 65 - 99 mg/dL   BUN 18 6 - 20 mg/dL   Creatinine, Ser 1.40 (H) 0.61 - 1.24 mg/dL   Calcium 8.8 (L) 8.9 - 10.3 mg/dL  Total Protein 6.6 6.5 - 8.1 g/dL   Albumin 3.7 3.5 - 5.0 g/dL   AST 20 15 - 41 U/L   ALT 12 (L) 17 - 63 U/L   Alkaline Phosphatase 79 38 - 126 U/L   Total Bilirubin 0.9 0.3 - 1.2 mg/dL   GFR calc non Af Amer 48 (L) >60 mL/min   GFR calc Af Amer 56 (L) >60 mL/min    Comment: (NOTE) The eGFR has been calculated using the CKD EPI equation. This calculation has not been validated in all clinical situations. eGFR's persistently <60 mL/min signify possible Chronic Kidney Disease.    Anion gap 11 5 - 15  I-stat troponin, ED     Status: None   Collection Time: 06/20/16  6:27 PM  Result Value Ref Range   Troponin i, poc 0.01 0.00 - 0.08 ng/mL   Comment 3            Comment: Due to the release kinetics of cTnI, a negative result within the first hours of the onset of symptoms does not rule out myocardial infarction with certainty. If myocardial infarction is  still suspected, repeat the test at appropriate intervals.   I-Stat Chem 8, ED     Status: Abnormal   Collection Time: 06/20/16  6:29 PM  Result Value Ref Range   Sodium 140 135 - 145 mmol/L   Potassium 3.6 3.5 - 5.1 mmol/L   Chloride 104 101 - 111 mmol/L   BUN 21 (H) 6 - 20 mg/dL   Creatinine, Ser 1.40 (H) 0.61 - 1.24 mg/dL   Glucose, Bld 111 (H) 65 - 99 mg/dL   Calcium, Ion 1.07 (L) 1.15 - 1.40 mmol/L   TCO2 25 0 - 100 mmol/L   Hemoglobin 15.6 13.0 - 17.0 g/dL   HCT 46.0 39.0 - 52.0 %   Ct Head Wo Contrast  Result Date: 06/20/2016 CLINICAL DATA:  Left-sided weakness since Wednesday EXAM: CT HEAD WITHOUT CONTRAST TECHNIQUE: Contiguous axial images were obtained from the base of the skull through the vertex without intravenous contrast. COMPARISON:  09/22/2007 FINDINGS: Brain: No territorial infarct is visualized. There is moderate atrophy. Periventricular white matter hypodensity consistent with small vessel ischemic change. Old appearing infarcts in the left basal ganglia. 9 mm mildly dense focus near the head of left caudate. Not much surrounding edema or mass effect. The ventricles are slightly enlarged, likely related to atrophy. Vascular: Vertebral artery and basilar artery calcification. Carotid artery calcification. No hyperdense vessels. Skull: No fracture or suspicious bone lesion. Sinuses/Orbits: Mucosal thickening in the maxillary and ethmoid sinuses. No acute orbital abnormality. Bilateral lens extraction. Other: None IMPRESSION: 1. 9 mm hyperdense focus near the head of the left caudate ; on certain if this represents mild calcification, sequelae of prior hemorrhage, or a small hyperdense mass. No significant surrounding edema or mass effect. MRI is recommended for further evaluation. 2. Atrophy and is bilateral white matter small vessel ischemic changes with old infarcts in the left basal ganglia. Electronically Signed   By: Donavan Foil M.D.   On: 06/20/2016 18:53   Mr Angiogram  Head Wo Contrast  Result Date: 06/20/2016 CLINICAL DATA:  Left-sided weakness and difficulty walking EXAM: MRI HEAD WITHOUT CONTRAST MRA HEAD WITHOUT CONTRAST TECHNIQUE: Multiplanar, multiecho pulse sequences of the brain and surrounding structures were obtained without intravenous contrast. Angiographic images of the head were obtained using MRA technique without contrast. COMPARISON:  Head CT 06/20/2016 FINDINGS: MRI HEAD FINDINGS Brain: There is focal diffusion restriction  within the tail of the right caudate, extending inferiorly near the posterior limb of the right internal capsule. No other diffusion restriction. There is beginning confluent hyperintense T2-weighted signal within the periventricular white matter, most often seen in the setting of chronic microvascular ischemia. There is an enlarged perivascular space of the left lentiform nucleus. 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 There is a focus of chronic microhemorrhage in the left occipital lobe. No hydrocephalus or extra-axial fluid collection. The midline structures are normal. There is age advanced atrophy without lobar predilection. Skull and upper cervical spine: The visualized skull base, calvarium, upper cervical spine and extracranial soft tissues are normal. Sinuses/Orbits: No fluid levels or advanced mucosal thickening. No mastoid effusion. Normal orbits. MRA HEAD FINDINGS Intracranial internal carotid arteries: Normal. Anterior cerebral arteries: Normal. Middle cerebral arteries: Normal. Posterior communicating arteries: Absent bilaterally. Posterior cerebral arteries: Normal. Basilar artery: Normal. Vertebral arteries: Left dominant. Normal. Superior cerebellar arteries: Duplicated bilaterally. Anterior inferior cerebellar arteries: Normal. Posterior inferior cerebellar arteries: Normal. IMPRESSION: 1. Acute infarct of the right caudate  tail, extending inferiorly along the posterior limb of the right internal capsule. This is in keeping with the reported left-sided weakness. 2. No acute hemorrhage or mass effect. 3. 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 4. Normal intracranial MRA. 5. Chronic microvascular ischemia and age advanced atrophy. Electronically Signed   By: Ulyses Jarred M.D.   On: 06/20/2016 22:18   Mr Brain Wo Contrast  Result Date: 06/20/2016 CLINICAL DATA:  Left-sided weakness and difficulty walking EXAM: MRI HEAD WITHOUT CONTRAST MRA HEAD WITHOUT CONTRAST TECHNIQUE: Multiplanar, multiecho pulse sequences of the brain and surrounding structures were obtained without intravenous contrast. Angiographic images of the head were obtained using MRA technique without contrast. COMPARISON:  Head CT 06/20/2016 FINDINGS: MRI HEAD FINDINGS Brain: There is focal diffusion restriction within the tail of the right caudate, extending inferiorly near the posterior limb of the right internal capsule. No other diffusion restriction. There is beginning confluent hyperintense T2-weighted signal within the periventricular white matter, most often seen in the setting of chronic microvascular ischemia. There is an enlarged perivascular space of the left lentiform nucleus. 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 There is a focus of chronic microhemorrhage in the left occipital lobe. No hydrocephalus or extra-axial fluid collection. The midline structures are normal. There is age advanced atrophy without lobar predilection. Skull and upper cervical spine: The visualized skull base, calvarium, upper cervical spine and extracranial soft tissues are normal. Sinuses/Orbits: No fluid levels or advanced mucosal thickening. No mastoid effusion. Normal orbits. MRA HEAD FINDINGS Intracranial  internal carotid arteries: Normal. Anterior cerebral arteries: Normal. Middle cerebral arteries: Normal. Posterior communicating arteries: Absent bilaterally. Posterior cerebral arteries: Normal. Basilar artery: Normal. Vertebral arteries: Left dominant. Normal. Superior cerebellar arteries: Duplicated bilaterally. Anterior inferior cerebellar arteries: Normal. Posterior inferior cerebellar arteries: Normal. IMPRESSION: 1. Acute infarct of the right caudate tail, extending inferiorly along the posterior limb of the right internal capsule. This is in keeping with the reported left-sided weakness. 2. No acute hemorrhage or mass effect. 3. 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 4. Normal intracranial MRA. 5. Chronic microvascular ischemia  and age advanced atrophy. Electronically Signed   By: Ulyses Jarred M.D.   On: 06/20/2016 22:18    Assessment: 76 y.o. male with multiple risk factors for stroke presenting with acute right cerebral infarction involving caudate nucleus and posterior limb of internal capsule. Patient also appears to have Parkinson's disease with increased tone and resting tremor right upper extremity as well as reported stoop posture and shuffling gait.  Stroke Risk Factors - hyperlipidemia, hypertension and smoking  Plan: 1. HgbA1c, fasting lipid panel 2. PT consult, OT consult 3. Echocardiogram 4. Carotid dopplers 5. Prophylactic therapy-aspirin and Plavix 6. Risk factor modification 7. Telemetry monitoring  C.R. Nicole Kindred, MD [facility] [phone]  06/20/2016, 11:30 PM

## 2016-06-20 NOTE — Telephone Encounter (Signed)
New Message     Pt has weakness in left side, can not stand or walk, has not pain or shortness of breath

## 2016-06-20 NOTE — H&P (Signed)
History and Physical    Donald Richardson ZOX:[id number] DOB: 23-Oct-1940 DOA: 06/20/2016  Referring MD/NP/PA: Dr. Eulis Foster PCP: Wyatt Haste, MD  Patient coming from:home  Chief Complaint: Left-sided weakness  HPI: Donald Richardson is a 76 y.o. male with medical history significant of HTN, HLD, CAD, DM type 2, COPD,  tobacco abuse thrombocytopenia; who presents with left-sided weakness and inability to walk. Symptoms possibly started late afternoon 2 days ago. Reports feeling weak in his left hand and leg. Patient initially thought symptoms would go away but have persisted. Patient reports falling twice at home.  Denies any significant trauma to his head or loss of consciousness. At baseline he notes having a resting tremor. Denies having any symptoms of shortness of breath, change of vision, slurred speech, chest pain, or leg swelling. He lives alone and when visitors came to see him today found him in his drive unable to walk.  ED Course: On admission into the emergency department patient was seen to be afebrile, heart rates 41-65, respirations 10-18, blood pressure up to 173/83 and O2 saturations were maintained on room air. Labs revealed platelets of 117, BUN 18, creatinine 1.4. MRI of the brain showed acute stroke of the right caudate. Neurology was consulted, but patient was out of the window for intervention.  Review of Systems: As per HPI otherwise 10 point review of systems negative.   Past Medical History:  Diagnosis Date  . ASHD (arteriosclerotic heart disease)    STENT  . COPD (chronic obstructive pulmonary disease) ([place])   . Diabetes mellitus    Hgb A1C- 5.8 in May 2017  . Hemorrhoids   . Hyperlipidemia   . Hypertension   . Smoker     Past Surgical History:  Procedure Laterality Date  . INGUINAL HERNIA REPAIR Left 10/26/2015   Procedure: LEFT INGUINAL HERNIA REPAIR WITH MESH;  Surgeon: Jackolyn Confer, MD;  Location: [facility];  Service: General;  Laterality: Left;  . INSERTION  OF MESH Left 10/26/2015   Procedure: INSERTION OF MESH;  Surgeon: Jackolyn Confer, MD;  Location: [facility];  Service: General;  Laterality: Left;  . NO PAST SURGERIES       reports that he has been smoking Cigarettes.  He has been smoking about 1.00 pack per day. He has never used smokeless tobacco. He reports that he does not drink alcohol or use drugs.  No Known Allergies  Family History  Problem Relation Age of Onset  . Breast cancer Mother   . Parkinson's disease Father     Prior to Admission medications   Medication Sig Start Date End Date Taking? Authorizing Provider  aspirin EC 81 MG tablet Take 81 mg by mouth daily.   Yes Historical Provider, MD  clopidogrel (PLAVIX) 75 MG tablet Take 75 mg by mouth daily.  02/20/16  Yes Historical Provider, MD  lisinopril (PRINIVIL,ZESTRIL) 10 MG tablet Take 1 tablet (10 mg total) by mouth daily. 08/08/15  Yes Fay Records, MD  rosuvastatin (CRESTOR) 40 MG tablet Take 1 tablet (40 mg total) by mouth daily. Patient taking differently: Take 20 mg by mouth at bedtime.  10/24/15  Yes Fay Records, MD    Physical Exam:   Constitutional: Elderly male in NAD, calm, comfortable Vitals:   06/20/16 2045 06/20/16 2100 06/20/16 2115 06/20/16 2230  BP: (!) 171/87 (!) 164/67 (!) 162/82 (!) 173/83  Pulse: (!) 56 (!) 45 (!) 48 (!) 46  Resp: 15 15 16 16   Temp:  TempSrc:      SpO2: 94% 97% 97% 96%   Eyes: PERRL, lids and conjunctivae normal ENMT: Mucous membranes are dry. Posterior pharynx clear of any exudate or lesions.  Neck: normal, supple, no masses, no thyromegaly Respiratory: Decreased overall aeration, no wheezing, no crackles. Normal respiratory effort. No accessory muscle use.  Cardiovascular: Bradycardic no murmurs / rubs / gallops. No extremity edema. 2+ pedal pulses. No carotid bruits.  Abdomen: no tenderness, no masses palpated. No hepatosplenomegaly. Bowel sounds positive.  Musculoskeletal: no clubbing / cyanosis. No joint deformity  upper and lower extremities. Good ROM, no contractures. Normal muscle tone.  Skin: no rashes, lesions, ulcers. No induration Neurologic: CN 2-12 grossly intact. Sensation intact, DTR normal. Strength 4/5 in the left upper and left lower extremity. Strength 5/5 on the right side. No signs of facial droop or*speech. Psychiatric: Normal judgment and insight. Alert and oriented x 3. Normal mood.     Labs on Admission: I have personally reviewed following labs and imaging studies  CBC:  Recent Labs Lab 06/20/16 1804 06/20/16 1829  WBC 10.2  --   NEUTROABS 7.9*  --   HGB 15.3 15.6  HCT 45.8 46.0  MCV 89.6  --   PLT 117*  --    Basic Metabolic Panel:  Recent Labs Lab 06/20/16 1804 06/20/16 1829  NA 138 140  K 3.7 3.6  CL 104 104  CO2 23  --   GLUCOSE 115* 111*  BUN 18 21*  CREATININE 1.40* 1.40*  CALCIUM 8.8*  --    GFR: CrCl cannot be calculated (Unknown ideal weight.). Liver Function Tests:  Recent Labs Lab 06/20/16 1804  AST 20  ALT 12*  ALKPHOS 79  BILITOT 0.9  PROT 6.6  ALBUMIN 3.7   No results for input(s): LIPASE, AMYLASE in the last 168 hours. No results for input(s): AMMONIA in the last 168 hours. Coagulation Profile:  Recent Labs Lab 06/20/16 1804  INR 1.08   Cardiac Enzymes: No results for input(s): CKTOTAL, CKMB, CKMBINDEX, TROPONINI in the last 168 hours. BNP (last 3 results) No results for input(s): PROBNP in the last 8760 hours. HbA1C: No results for input(s): HGBA1C in the last 72 hours. CBG: No results for input(s): GLUCAP in the last 168 hours. Lipid Profile: No results for input(s): CHOL, HDL, LDLCALC, TRIG, CHOLHDL, LDLDIRECT in the last 72 hours. Thyroid Function Tests: No results for input(s): TSH, T4TOTAL, FREET4, T3FREE, THYROIDAB in the last 72 hours. Anemia Panel: No results for input(s): VITAMINB12, FOLATE, FERRITIN, TIBC, IRON, RETICCTPCT in the last 72 hours. Urine analysis: No results found for: COLORURINE,  APPEARANCEUR, LABSPEC, PHURINE, GLUCOSEU, HGBUR, BILIRUBINUR, KETONESUR, PROTEINUR, UROBILINOGEN, NITRITE, LEUKOCYTESUR Sepsis Labs: No results found for this or any previous visit (from the past 240 hour(s)).   Radiological Exams on Admission: Ct Head Wo Contrast  Result Date: 06/20/2016 CLINICAL DATA:  Left-sided weakness since Wednesday EXAM: CT HEAD WITHOUT CONTRAST TECHNIQUE: Contiguous axial images were obtained from the base of the skull through the vertex without intravenous contrast. COMPARISON:  09/22/2007 FINDINGS: Brain: No territorial infarct is visualized. There is moderate atrophy. Periventricular white matter hypodensity consistent with small vessel ischemic change. Old appearing infarcts in the left basal ganglia. 9 mm mildly dense focus near the head of left caudate. Not much surrounding edema or mass effect. The ventricles are slightly enlarged, likely related to atrophy. Vascular: Vertebral artery and basilar artery calcification. Carotid artery calcification. No hyperdense vessels. Skull: No fracture or suspicious bone lesion. Sinuses/Orbits: Mucosal thickening  in the maxillary and ethmoid sinuses. No acute orbital abnormality. Bilateral lens extraction. Other: None IMPRESSION: 1. 9 mm hyperdense focus near the head of the left caudate ; on certain if this represents mild calcification, sequelae of prior hemorrhage, or a small hyperdense mass. No significant surrounding edema or mass effect. MRI is recommended for further evaluation. 2. Atrophy and is bilateral white matter small vessel ischemic changes with old infarcts in the left basal ganglia. Electronically Signed   By: Donavan Foil M.D.   On: 06/20/2016 18:53   Mr Angiogram Head Wo Contrast  Result Date: 06/20/2016 CLINICAL DATA:  Left-sided weakness and difficulty walking EXAM: MRI HEAD WITHOUT CONTRAST MRA HEAD WITHOUT CONTRAST TECHNIQUE: Multiplanar, multiecho pulse sequences of the brain and surrounding structures were  obtained without intravenous contrast. Angiographic images of the head were obtained using MRA technique without contrast. COMPARISON:  Head CT 06/20/2016 FINDINGS: MRI HEAD FINDINGS Brain: There is focal diffusion restriction within the tail of the right caudate, extending inferiorly near the posterior limb of the right internal capsule. No other diffusion restriction. There is beginning confluent hyperintense T2-weighted signal within the periventricular white matter, most often seen in the setting of chronic microvascular ischemia. There is an enlarged perivascular space of the left lentiform nucleus. 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 There is a focus of chronic microhemorrhage in the left occipital lobe. No hydrocephalus or extra-axial fluid collection. The midline structures are normal. There is age advanced atrophy without lobar predilection. Skull and upper cervical spine: The visualized skull base, calvarium, upper cervical spine and extracranial soft tissues are normal. Sinuses/Orbits: No fluid levels or advanced mucosal thickening. No mastoid effusion. Normal orbits. MRA HEAD FINDINGS Intracranial internal carotid arteries: Normal. Anterior cerebral arteries: Normal. Middle cerebral arteries: Normal. Posterior communicating arteries: Absent bilaterally. Posterior cerebral arteries: Normal. Basilar artery: Normal. Vertebral arteries: Left dominant. Normal. Superior cerebellar arteries: Duplicated bilaterally. Anterior inferior cerebellar arteries: Normal. Posterior inferior cerebellar arteries: Normal. IMPRESSION: 1. Acute infarct of the right caudate tail, extending inferiorly along the posterior limb of the right internal capsule. This is in keeping with the reported left-sided weakness. 2. No acute hemorrhage or mass effect. 3. 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 4. Normal intracranial MRA. 5. Chronic microvascular ischemia and age advanced atrophy. Electronically Signed   By: Ulyses Jarred M.D.   On: 06/20/2016 22:18   Mr Brain Wo Contrast  Result Date: 06/20/2016 CLINICAL DATA:  Left-sided weakness and difficulty walking EXAM: MRI HEAD WITHOUT CONTRAST MRA HEAD WITHOUT CONTRAST TECHNIQUE: Multiplanar, multiecho pulse sequences of the brain and surrounding structures were obtained without intravenous contrast. Angiographic images of the head were obtained using MRA technique without contrast. COMPARISON:  Head CT 06/20/2016 FINDINGS: MRI HEAD FINDINGS Brain: There is focal diffusion restriction within the tail of the right caudate, extending inferiorly near the posterior limb of the right internal capsule. No other diffusion restriction. There is beginning confluent hyperintense T2-weighted signal within the periventricular white matter, most often seen in the setting of chronic microvascular ischemia. There is an enlarged perivascular space of the left lentiform nucleus. 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 There is a focus of chronic microhemorrhage in the left occipital lobe. No hydrocephalus  or extra-axial fluid collection. The midline structures are normal. There is age advanced atrophy without lobar predilection. Skull and upper cervical spine: The visualized skull base, calvarium, upper cervical spine and extracranial soft tissues are normal. Sinuses/Orbits: No fluid levels or advanced mucosal thickening. No mastoid effusion. Normal orbits. MRA HEAD FINDINGS Intracranial internal carotid arteries: Normal. Anterior cerebral arteries: Normal. Middle cerebral arteries: Normal. Posterior communicating arteries: Absent bilaterally. Posterior cerebral arteries: Normal. Basilar artery: Normal. Vertebral arteries: Left dominant.  Normal. Superior cerebellar arteries: Duplicated bilaterally. Anterior inferior cerebellar arteries: Normal. Posterior inferior cerebellar arteries: Normal. IMPRESSION: 1. Acute infarct of the right caudate tail, extending inferiorly along the posterior limb of the right internal capsule. This is in keeping with the reported left-sided weakness. 2. No acute hemorrhage or mass effect. 3. 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 4. Normal intracranial MRA. 5. Chronic microvascular ischemia and age advanced atrophy. Electronically Signed   By: Ulyses Jarred M.D.   On: 06/20/2016 22:18    EKG: Independently reviewed. Sinus bradycardia with new right bundle branch block  Assessment/Plan Right caudate stroke: Acute/subacute. Patient presents with a 2 day history of left-sided weakness. MRI reveals a acute right caudate stroke. 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  - Admit to telemetry bed - Stroke order set initiated - Neuro checks - PT/OT/Speech to eval and treat - Check echocardiogram - Check Hemoglobin A1c and lipid panel in a.m. - ASA - Appreciate neurology consultative services, will follow-up  - Social work consult   Sinus bradycardia with Right bundle-branch block: Acute. Not seen on previous tracings. - Follow-up echocardiogram and telemetry overnight - Determine if formal cardiology consult is warranted, in a. m.  Essential hypertension - continue lisinopril  Chronic kidney disease stage III: Stable. Kidney function near patient's baseline of 1.3-1.62. - repeat bmp in a.m.  Diabetes mellitus type 2, diet controlled. Patient currently not on any oral agents. - Continue to monitor   Thrombocytopenia: Acute  on chronic. - Continue to monitor  Hypocalcemia: Initial ionized calcium 1.07   - calcium gluconate 1 g IV x 1 dose now - Continue to monitor for assessment    Hyperlipidemia - Continue Crestor    Tobacco  abuse - nicotine patch offered  DVT prophylaxis:  lovenox   Code Status: Full Family Communication: No family at bedside.   Disposition Plan: Patient possibly will need short rehabilitation stay. Consults called: Neuro Admission status:Inpatient  Norval Morton MD [facility] Pager [phone]  If 7PM-7AM, please contact night-coverage [email or web address] Password [email or web address]  06/20/2016, 11:17 PM

## 2016-06-20 NOTE — ED Notes (Signed)
Patient transported to MRI 

## 2016-06-21 ENCOUNTER — Inpatient Hospital Stay (HOSPITAL_COMMUNITY): Payer: Medicare Other

## 2016-06-21 ENCOUNTER — Encounter (HOSPITAL_COMMUNITY): Payer: Medicare Other

## 2016-06-21 DIAGNOSIS — I4510 Unspecified right bundle-branch block: Secondary | ICD-10-CM | POA: Diagnosis present

## 2016-06-21 DIAGNOSIS — I6339 Cerebral infarction due to thrombosis of other cerebral artery: Secondary | ICD-10-CM

## 2016-06-21 DIAGNOSIS — N183 Chronic kidney disease, stage 3 unspecified: Secondary | ICD-10-CM | POA: Diagnosis present

## 2016-06-21 LAB — BASIC METABOLIC PANEL WITH GFR
Anion gap: 7 (ref 5–15)
BUN: 17 mg/dL (ref 6–20)
CO2: 27 mmol/L (ref 22–32)
Calcium: 8.4 mg/dL — ABNORMAL LOW (ref 8.9–10.3)
Chloride: 106 mmol/L (ref 101–111)
Creatinine, Ser: 1.29 mg/dL — ABNORMAL HIGH (ref 0.61–1.24)
GFR calc Af Amer: >60 mL/min
GFR calc non Af Amer: 53 mL/min — ABNORMAL LOW
Glucose, Bld: 114 mg/dL — ABNORMAL HIGH (ref 65–99)
Potassium: 3.6 mmol/L (ref 3.5–5.1)
Sodium: 140 mmol/L (ref 135–145)

## 2016-06-21 LAB — CBC WITH DIFFERENTIAL/PLATELET
BASOS ABS: 0 10*3/uL (ref 0.0–0.1)
Basophils Relative: 0 %
EOS PCT: 4 %
Eosinophils Absolute: 0.2 10*3/uL (ref 0.0–0.7)
HCT: 40.5 % (ref 39.0–52.0)
Hemoglobin: 13.5 g/dL (ref 13.0–17.0)
LYMPHS PCT: 16 %
Lymphs Abs: 1 10*3/uL (ref 0.7–4.0)
MCH: 29.9 pg (ref 26.0–34.0)
MCHC: 33.3 g/dL (ref 30.0–36.0)
MCV: 89.8 fL (ref 78.0–100.0)
Monocytes Absolute: 0.8 10*3/uL (ref 0.1–1.0)
Monocytes Relative: 13 %
NEUTROS ABS: 4.1 10*3/uL (ref 1.7–7.7)
NEUTROS PCT: 67 %
PLATELETS: 105 10*3/uL — AB (ref 150–400)
RBC: 4.51 MIL/uL (ref 4.22–5.81)
RDW: 14.4 % (ref 11.5–15.5)
WBC: 6.2 10*3/uL (ref 4.0–10.5)

## 2016-06-21 LAB — LIPID PANEL
Cholesterol: 89 mg/dL (ref 0–200)
HDL: 30 mg/dL — AB (ref >40)
LDL CALC: 45 mg/dL (ref 0–99)
TRIGLYCERIDES: 68 mg/dL (ref <150)
Total CHOL/HDL Ratio: 3 RATIO
VLDL: 14 mg/dL (ref 0–40)

## 2016-06-21 LAB — TROPONIN I: Troponin I: <0.03 ng/mL (ref <0.03)

## 2016-06-21 MED ORDER — SODIUM CHLORIDE 0.9 % IV SOLN
1.0000 g | Freq: Once | INTRAVENOUS | Status: AC
  Administered 2016-06-21: 1 g via INTRAVENOUS
  Filled 2016-06-21 (×2): qty 10

## 2016-06-21 MED ORDER — ASPIRIN EC 325 MG PO TBEC
325.0000 mg | DELAYED_RELEASE_TABLET | Freq: Every day | ORAL | Status: DC
  Administered 2016-06-21 – 2016-06-24 (×4): 325 mg via ORAL
  Filled 2016-06-21 (×4): qty 1

## 2016-06-21 NOTE — Evaluation (Signed)
Physical Therapy Evaluation Patient Details Name: Donald Richardson MRN: [id number] DOB: 05/02/1940 Today's Date: 06/21/2016   History of Present Illness   Donald Richardson is a 76 y.o. male with medical history significant of HTN, HLD, CAD, DM type 2, COPD,  tobacco abuse thrombocytopenia; who presents with left-sided weakness and inability to walk. Symptoms possibly started late afternoon 2 days ago. Fell twice at home.  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 EKG showed R bundle branch block, pt with sustained bradycardia.  Clinical Impression  Pt admitted with above diagnosis. Pt currently with functional limitations due to the deficits listed below (see PT Problem List). Pt ambulated 30' with RW and min A, L foot dragging increased with distance, mod A for last 30' for returning to room. Pt with decreased awareness of deficits and safety. Pt very motivated, anticipate that he could reach mod I level in CIR.  Pt will benefit from skilled PT to increase their independence and safety with mobility to allow discharge to the venue listed below.       Follow Up Recommendations CIR    Equipment Recommendations  Rolling walker with 5" wheels    Recommendations for Other Services Rehab consult     Precautions / Restrictions Precautions Precautions: Fall Restrictions Weight Bearing Restrictions: No      Mobility  Bed Mobility Overal bed mobility: Needs Assistance Bed Mobility: Sit to Supine       Sit to supine: Supervision   General bed mobility comments: pt received up with OT. Sit to supine with supervision, able to position self in bed with increased time  Transfers Overall transfer level: Needs assistance Equipment used: Rolling walker (2 wheeled) Transfers: Sit to/from Stand Sit to Stand: Min assist         General transfer comment: min A for steadying and vc's for use of RW, unfamiliar with use of RW prior to this  episode  Ambulation/Gait Ambulation/Gait assistance: Min assist;Mod assist Ambulation Distance (Feet): 60 Feet Assistive device: Rolling walker (2 wheeled) Gait Pattern/deviations: Decreased weight shift to left;Decreased step length - left;Decreased dorsiflexion - left;Staggering right Gait velocity: decreased Gait velocity interpretation: <1.8 ft/sec, indicative of risk for recurrent falls General Gait Details: with increased distance, pt dragging LLE and unaware of decreased safety with this. vc's for stepping into RW and attending to LLE step length. Needed to return to room after 30', needed mod A for safety for second 30'  Stairs            Wheelchair Mobility    Modified Rankin (Stroke Patients Only) Modified Rankin (Stroke Patients Only) Pre-Morbid Rankin Score: No symptoms Modified Rankin: Moderately severe disability     Balance Overall balance assessment: Needs assistance Sitting-balance support: Single extremity supported;Feet supported Sitting balance-Leahy Scale: Fair   Postural control: Left lateral lean Standing balance support: Bilateral upper extremity supported;During functional activity;No upper extremity supported Standing balance-Leahy Scale: Fair Standing balance comment: left lean in standing at sink, dynamic standing needed consistent UE support                             Pertinent Vitals/Pain Pain Assessment: No/denies pain    Home Living Family/patient expects to be discharged to:: Private residence Living Arrangements: Alone Available Help at Discharge: Friend(s);Available PRN/intermittently Type of Home: House Home Access: Stairs to enter Entrance Stairs-Rails: None Entrance Stairs-Number of Steps: 2 Home Layout: One level Home Equipment: None Additional Comments: loves  airplanes    Prior Function Level of Independence: Independent         Comments: pt drives     Hand Dominance   Dominant Hand: Right     Extremity/Trunk Assessment   Upper Extremity Assessment Upper Extremity Assessment: Defer to OT evaluation LUE Deficits / Details: states that it "feels funny" to move; sensation typical per Pt report; forward flexion to 120 before compensation from other muscles, struggled to get arm behind him to reach lower back LUE Sensation: decreased proprioception LUE Coordination: decreased gross motor;decreased fine motor    Lower Extremity Assessment Lower Extremity Assessment: LLE deficits/detail LLE Deficits / Details: hip flex 4/5, knee ext 4/5, knee flex 4/5, ankle df 4/5, LLE ataxia noted with ambulation LLE Sensation: decreased light touch;decreased proprioception LLE Coordination: decreased fine motor;decreased gross motor    Cervical / Trunk Assessment Cervical / Trunk Assessment: Kyphotic  Communication   Communication: No difficulties  Cognition Arousal/Alertness: Awake/alert Behavior During Therapy: Impulsive Overall Cognitive Status: No family/caregiver present to determine baseline cognitive functioning                                 General Comments: Pt unaware of drooling during grooming and nursing reported he had urinated in bed and was unaware. Decreased safety awareness.       General Comments General comments (skin integrity, edema, etc.): pt very motivated and desires to return home.     Exercises     Assessment/Plan    PT Assessment Patient needs continued PT services  PT Problem List Decreased strength;Decreased activity tolerance;Decreased balance;Decreased mobility;Decreased coordination;Decreased cognition;Decreased knowledge of use of DME;Decreased safety awareness;Decreased knowledge of precautions       PT Treatment Interventions DME instruction;Gait training;Stair training;Functional mobility training;Therapeutic activities;Therapeutic exercise;Balance training;Neuromuscular re-education;Cognitive remediation;Patient/family education     PT Goals (Current goals can be found in the Care Plan section)  Acute Rehab PT Goals Patient Stated Goal: [place] PT Goal Formulation: With patient Time For Goal Achievement: 07/05/16 Potential to Achieve Goals: Good    Frequency Min 4X/week   Barriers to discharge Decreased caregiver support      Co-evaluation               End of Session Equipment Utilized During Treatment: Gait belt Activity Tolerance: Patient tolerated treatment well Patient left: in bed;with call bell/phone within reach;with bed alarm set Nurse Communication: Mobility status PT Visit Diagnosis: Unsteadiness on feet (R26.81);Hemiplegia and hemiparesis Hemiplegia - Right/Left: Left Hemiplegia - dominant/non-dominant: Non-dominant Hemiplegia - caused by: Cerebral infarction    Time: 1155-2080 PT Time Calculation (min) (ACUTE ONLY): 24 min   Charges:   PT Evaluation $PT Eval Moderate Complexity: 1 Procedure PT Treatments $Gait Training: 8-22 mins   PT G Codes:        Leighton Roach, PT  Acute Rehab Services  [place] 06/21/2016, 3:51 PM

## 2016-06-21 NOTE — Progress Notes (Signed)
PROGRESS NOTE  Donald Richardson WCB:[id number] DOB: [birth year] DOA: 06/20/2016 PCP: Wyatt Haste, MD   LOS: 1 day   Brief Narrative: Pleasant 76 year old male with hypertension, hyperlipidemia, coronary artery disease, diabetes, COPD, ongoing tobacco abuse who was admitted on 4/20 with new onset left-sided weakness and inability to walk, found to have an acute stroke on MRI.  Neurology is consulted.  Assessment & Plan: Principal Problem:   CVA (cerebral vascular accident) ([place]) Active Problems:   TOBACCO ABUSE   Bradycardia   Diabetes mellitus type 2, controlled, with complications ([place])   Essential hypertension   RBBB   CKD (chronic kidney disease), stage III   Right caudate stroke -Patient was admitted to the hospital with left-sided weakness.  Initial MRI showed acute infarct of the right caudate tail, extending inferiorly along the posterior limb of the right internal capsule -Complete stroke workup, 2D echo, carotid duplex are pending.  Lipid panel shows an LDL of 45, continue statin.  A1c is pending.  Sinus bradycardia with Right bundle-branch block -Was also present on the EKG was done in 2017.  Avoid beta-blockers.  Essential hypertension -Hold lisinopril to allow permissive hypertension  Hyperlipidemia -Continue Crestor  Chronic kidney disease stage III -Baseline creatinine between 1.3 and 1.6.  Currently at baseline at 1.3.  Diabetes mellitus type 2, diet controlled -Patient currently not on any oral agents. -A1c pending  Thrombocytopenia -Chronic, stable, no bleeding.  Hypocalcemia -Repleted  Tobacco abuse -Counseled for cessation, discussed with patient today   DVT prophylaxis: Lovenox Code Status: Full code Family Communication: No family at bedside Disposition Plan: To be determined pending PT eval, SNF versus home in 1 day  Consultants:   Neurology  Procedures:   2D echo: Pending  Antimicrobials:  None   Subjective: -Still feels  weak on the left side, otherwise he has no complaints, no chest pain, shortness of breath.  Objective: Vitals:   06/21/16 0357 06/21/16 0400 06/21/16 0458 06/21/16 0715  BP: (!) 117/51 (!) 122/44 (!) 118/52 117/61  Pulse: (!) 34  (!) 38 (!) 42  Resp: 18  18   Temp:    97.9 F (36.6 C)  TempSrc:    Oral  SpO2: 99%  95% 96%  Weight:      Height:        Intake/Output Summary (Last 24 hours) at 06/21/16 1120 Last data filed at 06/21/16 0658  Gross per 24 hour  Intake            477.5 ml  Output              180 ml  Net            297.5 ml   Filed Weights   06/21/16 0058  Weight: 67.1 kg (147 lb 14.4 oz)    Examination: Constitutional: NAD Vitals:   06/21/16 0357 06/21/16 0400 06/21/16 0458 06/21/16 0715  BP: (!) 117/51 (!) 122/44 (!) 118/52 117/61  Pulse: (!) 34  (!) 38 (!) 42  Resp: 18  18   Temp:    97.9 F (36.6 C)  TempSrc:    Oral  SpO2: 99%  95% 96%  Weight:      Height:       Eyes:  lids and conjunctivae normal Respiratory: clear to auscultation bilaterally, no wheezing, no crackles. Normal respiratory effort. Cardiovascular: Regular rate and rhythm, no murmurs / rubs / gallops. No LE edema.  Abdomen: no tenderness. Bowel sounds positive.  Musculoskeletal: no clubbing / cyanosis. No  joint deformity upper and lower extremities Skin: no rashes, lesions, ulcers. No induration Neurologic: 4 out of 5 on the left upper and lower extremity, otherwise 5 out of 5   Data Reviewed: I have personally reviewed following labs and imaging studies  CBC:  Recent Labs Lab 06/20/16 1804 06/20/16 1829 06/21/16 0529  WBC 10.2  --  6.2  NEUTROABS 7.9*  --  4.1  HGB 15.3 15.6 13.5  HCT 45.8 46.0 40.5  MCV 89.6  --  89.8  PLT 117*  --  382*   Basic Metabolic Panel:  Recent Labs Lab 06/20/16 1804 06/20/16 1829 06/21/16 0529  NA 138 140 140  K 3.7 3.6 3.6  CL 104 104 106  CO2 23  --  27  GLUCOSE 115* 111* 114*  BUN 18 21* 17  CREATININE 1.40* 1.40* 1.29*    CALCIUM 8.8*  --  8.4*   GFR: Estimated Creatinine Clearance: 47.7 mL/min (A) (by C-G formula based on SCr of 1.29 mg/dL (H)). Liver Function Tests:  Recent Labs Lab 06/20/16 1804  AST 20  ALT 12*  ALKPHOS 79  BILITOT 0.9  PROT 6.6  ALBUMIN 3.7   No results for input(s): LIPASE, AMYLASE in the last 168 hours. No results for input(s): AMMONIA in the last 168 hours. Coagulation Profile:  Recent Labs Lab 06/20/16 1804  INR 1.08   Cardiac Enzymes:  Recent Labs Lab 06/21/16 0529  TROPONINI <0.03   BNP (last 3 results) No results for input(s): PROBNP in the last 8760 hours. HbA1C: No results for input(s): HGBA1C in the last 72 hours. CBG: No results for input(s): GLUCAP in the last 168 hours. Lipid Profile:  Recent Labs  06/21/16 0529  CHOL 89  HDL 30*  LDLCALC 45  TRIG 68  CHOLHDL 3.0   Thyroid Function Tests: No results for input(s): TSH, T4TOTAL, FREET4, T3FREE, THYROIDAB in the last 72 hours. Anemia Panel: No results for input(s): VITAMINB12, FOLATE, FERRITIN, TIBC, IRON, RETICCTPCT in the last 72 hours. Urine analysis: No results found for: COLORURINE, APPEARANCEUR, LABSPEC, PHURINE, GLUCOSEU, HGBUR, BILIRUBINUR, KETONESUR, PROTEINUR, UROBILINOGEN, NITRITE, LEUKOCYTESUR Sepsis Labs: Invalid input(s): PROCALCITONIN, LACTICIDVEN  No results found for this or any previous visit (from the past 240 hour(s)).    Radiology Studies: Dg Chest 2 View  Result Date: 06/21/2016 CLINICAL DATA:  Status post CVA.  Initial encounter. EXAM: CHEST  2 VIEW COMPARISON:  Chest radiograph performed 07/23/2012, and CT of the chest performed 09/02/2012 FINDINGS: The lungs are well-aerated. Mild left basilar airspace opacity raises concern for pneumonia. There is no evidence of pleural effusion or pneumothorax. The heart is borderline normal in size. No acute osseous abnormalities are seen. IMPRESSION: Mild left basilar airspace opacity raises concern for pneumonia.  Electronically Signed   By: Garald Balding M.D.   On: 06/21/2016 00:44   Ct Head Wo Contrast  Result Date: 06/20/2016 CLINICAL DATA:  Left-sided weakness since Wednesday EXAM: CT HEAD WITHOUT CONTRAST TECHNIQUE: Contiguous axial images were obtained from the base of the skull through the vertex without intravenous contrast. COMPARISON:  09/22/2007 FINDINGS: Brain: No territorial infarct is visualized. There is moderate atrophy. Periventricular white matter hypodensity consistent with small vessel ischemic change. Old appearing infarcts in the left basal ganglia. 9 mm mildly dense focus near the head of left caudate. Not much surrounding edema or mass effect. The ventricles are slightly enlarged, likely related to atrophy. Vascular: Vertebral artery and basilar artery calcification. Carotid artery calcification. No hyperdense vessels. Skull: No fracture or  suspicious bone lesion. Sinuses/Orbits: Mucosal thickening in the maxillary and ethmoid sinuses. No acute orbital abnormality. Bilateral lens extraction. Other: None IMPRESSION: 1. 9 mm hyperdense focus near the head of the left caudate ; on certain if this represents mild calcification, sequelae of prior hemorrhage, or a small hyperdense mass. No significant surrounding edema or mass effect. MRI is recommended for further evaluation. 2. Atrophy and is bilateral white matter small vessel ischemic changes with old infarcts in the left basal ganglia. Electronically Signed   By: Donavan Foil M.D.   On: 06/20/2016 18:53   Mr Angiogram Head Wo Contrast  Result Date: 06/20/2016 CLINICAL DATA:  Left-sided weakness and difficulty walking EXAM: MRI HEAD WITHOUT CONTRAST MRA HEAD WITHOUT CONTRAST TECHNIQUE: Multiplanar, multiecho pulse sequences of the brain and surrounding structures were obtained without intravenous contrast. Angiographic images of the head were obtained using MRA technique without contrast. COMPARISON:  Head CT 06/20/2016 FINDINGS: MRI HEAD  FINDINGS Brain: There is focal diffusion restriction within the tail of the right caudate, extending inferiorly near the posterior limb of the right internal capsule. No other diffusion restriction. There is beginning confluent hyperintense T2-weighted signal within the periventricular white matter, most often seen in the setting of chronic microvascular ischemia. There is an enlarged perivascular space of the left lentiform nucleus. 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 There is a focus of chronic microhemorrhage in the left occipital lobe. No hydrocephalus or extra-axial fluid collection. The midline structures are normal. There is age advanced atrophy without lobar predilection. Skull and upper cervical spine: The visualized skull base, calvarium, upper cervical spine and extracranial soft tissues are normal. Sinuses/Orbits: No fluid levels or advanced mucosal thickening. No mastoid effusion. Normal orbits. MRA HEAD FINDINGS Intracranial internal carotid arteries: Normal. Anterior cerebral arteries: Normal. Middle cerebral arteries: Normal. Posterior communicating arteries: Absent bilaterally. Posterior cerebral arteries: Normal. Basilar artery: Normal. Vertebral arteries: Left dominant. Normal. Superior cerebellar arteries: Duplicated bilaterally. Anterior inferior cerebellar arteries: Normal. Posterior inferior cerebellar arteries: Normal. IMPRESSION: 1. Acute infarct of the right caudate tail, extending inferiorly along the posterior limb of the right internal capsule. This is in keeping with the reported left-sided weakness. 2. No acute hemorrhage or mass effect. 3. 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 4. Normal intracranial MRA. 5. Chronic microvascular ischemia and age advanced atrophy. Electronically Signed   By: Ulyses Jarred M.D.    On: 06/20/2016 22:18   Mr Brain Wo Contrast  Result Date: 06/20/2016 CLINICAL DATA:  Left-sided weakness and difficulty walking EXAM: MRI HEAD WITHOUT CONTRAST MRA HEAD WITHOUT CONTRAST TECHNIQUE: Multiplanar, multiecho pulse sequences of the brain and surrounding structures were obtained without intravenous contrast. Angiographic images of the head were obtained using MRA technique without contrast. COMPARISON:  Head CT 06/20/2016 FINDINGS: MRI HEAD FINDINGS Brain: There is focal diffusion restriction within the tail of the right caudate, extending inferiorly near the posterior limb of the right internal capsule. No other diffusion restriction. There is beginning confluent hyperintense T2-weighted signal within the periventricular white matter, most often seen in the setting of chronic microvascular ischemia. There is an enlarged perivascular space of the left lentiform nucleus. 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 There is a focus of chronic microhemorrhage in  the left occipital lobe. No hydrocephalus or extra-axial fluid collection. The midline structures are normal. There is age advanced atrophy without lobar predilection. Skull and upper cervical spine: The visualized skull base, calvarium, upper cervical spine and extracranial soft tissues are normal. Sinuses/Orbits: No fluid levels or advanced mucosal thickening. No mastoid effusion. Normal orbits. MRA HEAD FINDINGS Intracranial internal carotid arteries: Normal. Anterior cerebral arteries: Normal. Middle cerebral arteries: Normal. Posterior communicating arteries: Absent bilaterally. Posterior cerebral arteries: Normal. Basilar artery: Normal. Vertebral arteries: Left dominant. Normal. Superior cerebellar arteries: Duplicated bilaterally. Anterior inferior cerebellar arteries: Normal. Posterior inferior cerebellar arteries: Normal. IMPRESSION: 1. Acute  infarct of the right caudate tail, extending inferiorly along the posterior limb of the right internal capsule. This is in keeping with the reported left-sided weakness. 2. No acute hemorrhage or mass effect. 3. 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 4. Normal intracranial MRA. 5. Chronic microvascular ischemia and age advanced atrophy. Electronically Signed   By: Ulyses Jarred M.D.   On: 06/20/2016 22:18     Scheduled Meds: .  stroke: mapping our early stages of recovery book   Does not apply Once  . aspirin EC  325 mg Oral Daily  . clopidogrel  75 mg Oral Daily  . enoxaparin (LOVENOX) injection  40 mg Subcutaneous Q24H  . lisinopril  10 mg Oral Daily  . rosuvastatin  20 mg Oral QHS   Continuous Infusions: . sodium chloride 75 mL/hr at 06/21/16 0156     Marzetta Board, MD, PhD [facility] Pager [phone]  If 7PM-7AM, please contact night-coverage [email or web address] Password [facility] 06/21/2016, 11:20 AM

## 2016-06-21 NOTE — Evaluation (Signed)
Occupational Therapy Evaluation Patient Details Name: Donald Richardson MRN: [id number] DOB: June 01, 1940 Today's Date: 06/21/2016    History of Present Illness  Donald Richardson is a 76 y.o. male with medical history significant of HTN, HLD, CAD, DM type 2, COPD,  tobacco abuse thrombocytopenia; who presents with left-sided weakness and inability to walk. Symptoms possibly started late afternoon 2 days ago. Fell twice at home.  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 EKG showed R bundle branch block, pt with sustained bradycardia.   Clinical Impression   PTA Pt very independent in ADL/IADL, mobility, driving, [place]. Pt currently mod A overall for ADL as Pt requires min assist, but requires cueing for safety and sequencing and decreased awareness of deficits. Please see OT problem list below. Pt will benefit from skilled OT in the acute setting to maximize safety and independence in ADL and functional transfers. Pt will require CIR level therapy to return to modified independent level, he is willing and excited to work with therapy to get as strong and independent as possible.    Follow Up Recommendations  CIR    Equipment Recommendations  Other (comment) (defer to next venue)    Recommendations for Other Services Rehab consult     Precautions / Restrictions Precautions Precautions: Fall Restrictions Weight Bearing Restrictions: No      Mobility Bed Mobility               General bed mobility comments: Pt sitting OOB in chair when OT entered the room  Transfers Overall transfer level: Needs assistance Equipment used: Rolling walker (2 wheeled) Transfers: Sit to/from Stand Sit to Stand: Min assist         General transfer comment: vc for safe hand placement and min A for steadying.     Balance Overall balance assessment: Needs assistance Sitting-balance support: Single extremity supported;Feet supported Sitting balance-Leahy Scale:  Fair     Standing balance support: Bilateral upper extremity supported;During functional activity;No upper extremity supported Standing balance-Leahy Scale: Fair Standing balance comment: brief moments of static standing for urination, leaning to left as he fatuigued quickly and had no awareness of lean                           ADL either performed or assessed with clinical judgement   ADL Overall ADL's : Needs assistance/impaired Eating/Feeding: Modified independent;Sitting   Grooming: Wash/dry hands;Wash/dry face;Oral care;Minimal assistance;Standing Grooming Details (indicate cue type and reason): assist for balance, Pt progressively fatigued leaning to left more as time went on, unable to maintain standing for shaving Upper Body Bathing: Min guard;Sitting   Lower Body Bathing: Minimal assistance;Sitting/lateral leans   Upper Body Dressing : Sitting;Min guard   Lower Body Dressing: Minimal assistance;Sitting/lateral leans   Toilet Transfer: Moderate assistance;Cueing for safety;Cueing for sequencing;Ambulation;Regular Toilet;RW Toilet Transfer Details (indicate cue type and reason): vc for safety and sequencing with RW Toileting- Clothing Manipulation and Hygiene: Minimal assistance;Sit to/from stand Toileting - Clothing Manipulation Details (indicate cue type and reason): standing for urination Tub/ Shower Transfer: Tub transfer;Moderate assistance;Ambulation;Rolling walker;3 in 1   Functional mobility during ADLs: Minimal assistance;Rolling walker;Cueing for safety;Cueing for sequencing General ADL Comments: Pt very motivated to be independent and remain independent     Vision Baseline Vision/History: Wears glasses Wears Glasses: Reading only Patient Visual Report: No change from baseline Vision Assessment?: No apparent visual deficits     Perception  Praxis      Pertinent Vitals/Pain Pain Assessment: No/denies pain     Hand Dominance Right    Extremity/Trunk Assessment Upper Extremity Assessment Upper Extremity Assessment: LUE deficits/detail;Generalized weakness LUE Deficits / Details: states that it "feels funny" to move; sensation typical per Pt report; forward flexion to 120 before compensation from other muscles, struggled to get arm behind him to reach lower back LUE Sensation: decreased proprioception LUE Coordination: decreased gross motor;decreased fine motor   Lower Extremity Assessment Lower Extremity Assessment: Defer to PT evaluation   Cervical / Trunk Assessment Cervical / Trunk Assessment: Kyphotic   Communication Communication Communication: No difficulties   Cognition Arousal/Alertness: Awake/alert Behavior During Therapy: WFL for tasks assessed/performed Overall Cognitive Status: Within Functional Limits for tasks assessed                                 General Comments: Pt unaware of drooling during grooming   General Comments  Pt with decreased awareness of deficits during session. Very cooperative with therapist when attention called to deficits and quick to implement suggested strategies/compensatory methods    Exercises     Shoulder Instructions      Home Living Family/patient expects to be discharged to:: Private residence Living Arrangements: Alone Available Help at Discharge: Friend(s);Available PRN/intermittently Type of Home: House Home Access: Stairs to enter [employer] of Steps: 2 Entrance Stairs-Rails: None Home Layout: One level     Bathroom Shower/Tub: Teacher, early years/pre: Standard Bathroom Accessibility: Yes How Accessible: Accessible via walker Home Equipment: None   Additional Comments: loves airplanes      Prior Functioning/Environment Level of Independence: Independent                 OT Problem List: Decreased strength;Decreased range of motion;Decreased activity tolerance;Impaired balance (sitting and/or  standing);Decreased safety awareness;Decreased knowledge of use of DME or AE;Impaired UE functional use      OT Treatment/Interventions: Self-care/ADL training;Therapeutic exercise;DME and/or AE instruction;Therapeutic activities;Patient/family education;Balance training    OT Goals(Current goals can be found in the care plan section) Acute Rehab OT Goals Patient Stated Goal: [place] to the airport for his hobby OT Goal Formulation: With patient Time For Goal Achievement: 07/05/16 Potential to Achieve Goals: Good ADL Goals Pt Will Perform Grooming: with modified independence;standing Pt Will Perform Upper Body Bathing: with modified independence;standing Pt Will Perform Lower Body Bathing: with modified independence;sit to/from stand Pt Will Transfer to Toilet: with modified independence;ambulating;regular height toilet Pt Will Perform Toileting - Clothing Manipulation and hygiene: with modified independence;sit to/from stand  OT Frequency: Min 3X/week   Barriers to D/C:    Pt lives alone but has wonderful support network in friends and [place]       Co-evaluation              End of Session Equipment Utilized During Treatment: Administrator, arts Communication: Mobility status  Activity Tolerance: Patient tolerated treatment well Patient left: Other (comment) (with PT walking in hall)  OT Visit Diagnosis: Unsteadiness on feet (R26.81);Other symptoms and signs involving the nervous system (R29.898);Hemiplegia and hemiparesis Hemiplegia - Right/Left: Left Hemiplegia - dominant/non-dominant: Non-Dominant Hemiplegia - caused by: Cerebral infarction                Time: 1610-9604 OT Time Calculation (min): 23 min Charges:  OT General Charges $OT Visit: 1 Procedure OT Evaluation $OT Eval Moderate Complexity: 1 Procedure OT  Treatments $Self Care/Home Management : 8-22 mins G-Codes:     Hulda Humphrey OTR/L [place] 06/21/2016,  2:52 PM

## 2016-06-21 NOTE — Progress Notes (Signed)
12 lead EKG results are Sinus brady with Right Bundle branch block.  Provider paged with the results   RN will continue to monitor

## 2016-06-21 NOTE — ED Notes (Signed)
Attempted report x1. 

## 2016-06-21 NOTE — Progress Notes (Signed)
Called to bedside to evaluation pt with regard to bradycardia.   Pt is sleeping, HR 42-43, BP 119/83, sat 99% on rm air. He was easily woken, denies CP or headache, and reports "my heart rate is always around 40."   He had a negative troponin in ED and non-ischemic EKG. HR appears to be lower now than in ED. Plan to repeat EKG and obtain another troponin to exclude an ischemic etiology. Echocardiogram has already been ordered. With stable BP and no CP or HA, plan to monitor for now and follow-up on the pending studies.

## 2016-06-21 NOTE — Progress Notes (Signed)
Patient's heart rate currently in the low to mid 30's.  Rate as of 31 recorded.  Patient's current blood pressure is 117/51 with o2 saturation of 99% on room air.  RN paged provider and provider advised that vital signs are currently stable however she would have someone come see the patient.  RN will continue to monitor patient

## 2016-06-21 NOTE — Progress Notes (Signed)
STROKE TEAM PROGRESS NOTE   HISTORY OF PRESENT ILLNESS (per record) Donald Richardson is an 76 y.o. male history of hypertension, hyperlipidemia, chronic heart disease s/p stent, COPD and equivocal history of diabetes mellitus, presenting with weakness and sensory changes involving left extremities for 2 days. Onset of symptoms was around 6 PM on 06/18/2016. He has no previous history of stroke nor TIA. He's been taking aspirin and Plavix daily. 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 No sensory change has been noted and he also has not noted droop. Primary weakness is involved his left leg and he's fallen multiple times. He has a known resting tremor and reportedly has developed shuffling gait and stooped posture. He has not been treated at any point for Parkinson's disease.  LSN: 6 PM on 06/18/2016 tPA Given: No: Beyond time under for treatment consideration mRankin:   SUBJECTIVE (INTERVAL HISTORY) No family is at the bedside. Pt initially sleeping but easily arousable. He stated that his weakness feels better but not started walking yet. He has b/l hand intermittent tremor for 25 years, denies any difficulty with walking PTA or falls. Never see a neurologist. He denies any shuffling gait at home.    OBJECTIVE Temp:  [97.7 F (36.5 C)-98.4 F (36.9 C)] 97.9 F (36.6 C) (04/21 0715) Pulse Rate:  [34-65] 42 (04/21 0715) Cardiac Rhythm: Sinus bradycardia;Bundle branch block (04/21 0700) Resp:  [10-18] 18 (04/21 0458) BP: (117-173)/(44-95) 117/61 (04/21 0715) SpO2:  [91 %-99 %] 96 % (04/21 0715) Weight:  [67.1 kg (147 lb 14.4 oz)] 67.1 kg (147 lb 14.4 oz) (04/21 0058)  CBC:   Recent Labs Lab 06/20/16 1804 06/20/16 1829 06/21/16 0529  WBC 10.2  --  6.2  NEUTROABS 7.9*  --  4.1  HGB 15.3 15.6 13.5  HCT 45.8 46.0 40.5  MCV 89.6  --  89.8  PLT 117*  --  105*    Basic Metabolic Panel:   Recent  Labs Lab 06/20/16 1804 06/20/16 1829 06/21/16 0529  NA 138 140 140  K 3.7 3.6 3.6  CL 104 104 106  CO2 23  --  27  GLUCOSE 115* 111* 114*  BUN 18 21* 17  CREATININE 1.40* 1.40* 1.29*  CALCIUM 8.8*  --  8.4*    Lipid Panel:     Component Value Date/Time   CHOL 89 06/21/2016 0529   TRIG 68 06/21/2016 0529   HDL 30 (L) 06/21/2016 0529   CHOLHDL 3.0 06/21/2016 0529   VLDL 14 06/21/2016 0529   LDLCALC 45 06/21/2016 0529   HgbA1c:  Lab Results  Component Value Date   HGBA1C 6.2 03/24/2016   Urine Drug Screen: No results found for: LABOPIA, COCAINSCRNUR, LABBENZ, AMPHETMU, THCU, LABBARB  Alcohol Level No results found for: [place] I have personally reviewed the radiological images below and agree with the radiology interpretations.  Dg Chest 2 View 06/21/2016 Mild left basilar airspace opacity raises concern for pneumonia.   Ct Head Wo Contrast 06/20/2016 1. 9 mm hyperdense focus near the head of the left caudate ; uncertain if this represents mild calcification, sequelae of prior hemorrhage, or a small hyperdense mass. No significant surrounding edema or mass effect. MRI is recommended for further evaluation.  2. Atrophy and is bilateral white matter small vessel ischemic changes with old infarcts in the left basal ganglia.   Mri and Mra Head Wo Contrast 06/20/2016 1. Acute infarct of the  right caudate tail, extending inferiorly along the posterior limb of the right internal capsule. This is in keeping with the reported left-sided weakness.  2. No acute hemorrhage or mass effect.  3. 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  4. Normal intracranial MRA.  5. Chronic microvascular ischemia and age advanced atrophy.   CUS pending  TTE pending    PHYSICAL EXAM  Temp:  [97.7 F (36.5 C)-98.4 F (36.9 C)] 97.9 F (36.6 C) (04/21 0715) Pulse Rate:  [34-65] 42 (04/21 0715) Resp:  [10-18] 18  (04/21 0458) BP: (117-173)/(44-95) 117/61 (04/21 0715) SpO2:  [91 %-99 %] 96 % (04/21 0715) Weight:  [147 lb 14.4 oz (67.1 kg)] 147 lb 14.4 oz (67.1 kg) (04/21 0058)  General - Well nourished, well developed, in no apparent distress.  Ophthalmologic - Fundi not visualized due to noncooperation.  Cardiovascular - Regular rhythm but bradycardia.  Mental Status -  Level of arousal and orientation to time, place, and person were intact. Language including expression, naming, repetition, comprehension was assessed and found intact. Fund of Knowledge was assessed and was impaired  Cranial Nerves II - XII - II - Visual field intact OU. III, IV, VI - Extraocular movements intact. V - Facial sensation intact bilaterally. VII - Facial movement intact bilaterally. VIII - Hearing & vestibular intact bilaterally. X - Palate elevates symmetrically. XI - Chin turning & shoulder shrug intact bilaterally. XII - Tongue protrusion intact.  Motor Strength - The patient's strength was normal in RUE and RLE, but 5-/5 LUE and LLE with pronator drift was present on the left and left foot 4+/5 DF.  Bulk was normal and fasciculations were absent.   Motor Tone - Muscle tone was assessed at the neck and appendages and was normal except mild cogwheeling on the right LE.  Reflexes - The patient's reflexes were 1+ in all extremities and he had no pathological reflexes.  Sensory - Light touch, temperature/pinprick were assessed and were symmetrical.    Coordination - The patient had normal movements in the hands and feet with no ataxia or dysmetria.  R hand resting tremor, right LE mild cog-wheeling.  Gait and Station - deferred   ASSESSMENT/PLAN [name] is a 76 y.o. male with history of tobacco use, hypertension, previous strokes by imaging hyperlipidemia, diabetes mellitus, COPD, coronary artery disease s/p stent presenting with weakness and sensory changes of the left lower extremity. He did not  receive IV t-PA due to presentation.  Stroke:  acute infarct right CR, likely secondary to small vessel disease, risk factor including smoking, DM, HTN, HLD, CAD s/p stent  Resultant  subtle left UE and LE weakness  MRI - Acute infarct right CR. Old left caudate head and left pontine lacune  MRA - left PCA stenosis. 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 Yes; Fluid consistency: Thin  aspirin 81 mg daily and clopidogrel 75 mg daily prior to admission, now on aspirin 325 mg daily and clopidogrel 75 mg daily. Continue DAPT on discharge for cardiac and stroke prevention  Patient counseled to be compliant with his antithrombotic medications  Ongoing aggressive stroke risk factor management  Therapy recommendations: pending  Disposition: Pending  Hypertension  Blood pressure tends to run low - 117/61  Permissive hypertension (OK if < 220/120) but gradually normalize in 5-7 days  Long-term BP goal normotensive  Hyperlipidemia  Home meds:  Crestor 20 mg daily resumed in hospital  LDL 45, goal < 70  Continue statin at discharge  Diabetes  HgbA1c pending, goal < 7.0  Controlled  Tobacco abuse  Current smoker  Smoking cessation counseling provided  Nicotine patch provided  Pt is willing to quit  Other Stroke Risk Factors  Advanced age  Hx stroke/TIA - by imaging  CAD s/p stenting  Other Active Problems  Elevated creatinine - 1.40 -> 1.29  Hand tremor - no significant PD symptoms, no fall at home and denies walking difficult PTA - outpt follow up  Hospital day # 1  Rosalin Hawking, MD PhD Stroke Neurology 06/21/2016 11:55 AM    To contact Stroke Continuity provider, please refer to [email or web address] After hours, contact General Neurology

## 2016-06-21 NOTE — ED Notes (Signed)
Pt transported to xray 

## 2016-06-21 NOTE — Progress Notes (Signed)
Inpatient Rehabilitation  Per therapy request, patient was screened by Gunnar Fusi for appropriateness for an Inpatient Acute Rehab consult.  At this time we are recommending an Inpatient Rehab consult.  Please order if you are agreeable.    Gunnar Fusi, M.A., CCC/SLP Admission [place]  Cell [phone]

## 2016-06-21 NOTE — Progress Notes (Signed)
Patient resting sitting up in bed.  Alert, verbally pleasant, visiting with company. No complaints of discomfort or pain.

## 2016-06-22 ENCOUNTER — Inpatient Hospital Stay (HOSPITAL_COMMUNITY): Payer: Medicare Other

## 2016-06-22 DIAGNOSIS — I63311 Cerebral infarction due to thrombosis of right middle cerebral artery: Secondary | ICD-10-CM

## 2016-06-22 LAB — ECHOCARDIOGRAM COMPLETE
Height: 68 in
Weight: 2366.4 oz

## 2016-06-22 LAB — HEMOGLOBIN A1C
HEMOGLOBIN A1C: 5.8 % — AB (ref 4.8–5.6)
MEAN PLASMA GLUCOSE: 120 mg/dL

## 2016-06-22 LAB — CALCIUM, IONIZED: CALCIUM, IONIZED, SERUM: 4.9 mg/dL (ref 4.5–5.6)

## 2016-06-22 NOTE — Progress Notes (Addendum)
Physical Therapy Treatment Patient Details Name: Donald Richardson MRN: [id number] DOB: 04-22-1940 Today's Date: 06/22/2016    History of Present Illness  Donald Richardson is a 76 y.o. male with medical history significant of HTN, HLD, CAD, DM type 2, COPD,  tobacco abuse thrombocytopenia; who presents with left-sided weakness and inability to walk. Symptoms possibly started late afternoon 2 days ago. Fell twice at home.  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 EKG showed R bundle branch block, pt with sustained bradycardia.    PT Comments    Patient seen for therapy progression. Worked with and without assistive device to facilitate functional recovery of gait. Patient tolerated pre gait activity well with assist, progressed to ambulation with RW min assist with multiple LOB, and HHA requiring moderate assist due to LLE deficits. At this time, feel patient is an ideal candidate for comprehensive therapies to progress functional recovery. Highly recommend CIR.  Follow Up Recommendations  CIR     Equipment Recommendations  Rolling walker with 5" wheels    Recommendations for Other Services Rehab consult     Precautions / Restrictions Precautions Precautions: Fall Restrictions Weight Bearing Restrictions: No    Mobility  Bed Mobility Overal bed mobility: Needs Assistance Bed Mobility: Supine to sit       Supine to sit: Supervision   General bed mobility comments: Patient required cues for positioning and significant increase in time for functional task.  Transfers Overall transfer level: Needs assistance Equipment used: Rolling walker (2 wheeled) Transfers: Sit to/from Stand Sit to Stand: Min assist         General transfer comment: min A for steadying and vc's for use of RW, unfamiliar with use of RW prior to this episode  Ambulation/Gait Ambulation/Gait assistance: Min assist;Mod assist Ambulation Distance (Feet): 70 Feet Assistive device: Rolling  walker (2 wheeled) (30 ft with RW, 70 ft with HHA -moderate assist) Gait Pattern/deviations: Decreased weight shift to left;Decreased step length - left;Decreased dorsiflexion - left;Staggering right Gait velocity: decreased Gait velocity interpretation: <1.8 ft/sec, indicative of risk for recurrent falls General Gait Details: patient able to ambulate with min assist and RW, max multi modal cues for positioning and posture, LLE drag and difficulty with placement. Attempted HHA with higher level tasks patient requiring moderate assist to maintain upright positioning.    Stairs            Wheelchair Mobility    Modified Rankin (Stroke Patients Only) Modified Rankin (Stroke Patients Only) Pre-Morbid Rankin Score: No symptoms Modified Rankin: Moderately severe disability     Balance Overall balance assessment: Needs assistance Sitting-balance support: Single extremity supported;Feet supported Sitting balance-Leahy Scale: Fair   Postural control: Left lateral lean Standing balance support: Bilateral upper extremity supported;During functional activity;No upper extremity supported Standing balance-Leahy Scale: Poor Standing balance comment: poor ability to maintain upright posture without bilateral UE support             High level balance activites: Backward walking;Direction changes;Turns;Head turns High Level Balance Comments: Moderate to max assist for higher level balance tasks, patient unable to coordinate stable gait during these tasks, significant difficulty positioning LLE and maintaining upright posture throughout. Multiple LOB requiring max assist to prevent fall             Cognition Arousal/Alertness: Awake/alert Behavior During Therapy: Impulsive Overall Cognitive Status: No family/caregiver present to determine baseline cognitive functioning  Exercises Other Exercises Other Exercises: standing pre  gait dynamic balance activities with and without RW Other Exercises: Bilateral LE ROM exercises    General Comments        Pertinent Vitals/Pain Pain Assessment: No/denies pain    Home Living                      Prior Function            PT Goals (current goals can now be found in the care plan section) Acute Rehab PT Goals Patient Stated Goal: [place] PT Goal Formulation: With patient Time For Goal Achievement: 07/05/16 Potential to Achieve Goals: Good Progress towards PT goals: Progressing toward goals    Frequency    Min 4X/week      PT Plan Current plan remains appropriate    Co-evaluation             End of Session Equipment Utilized During Treatment: Gait belt Activity Tolerance: Patient tolerated treatment well Patient left: in chair;with call bell/phone within reach Nurse Communication: Mobility status PT Visit Diagnosis: Unsteadiness on feet (R26.81);Hemiplegia and hemiparesis Hemiplegia - Right/Left: Left Hemiplegia - dominant/non-dominant: Non-dominant Hemiplegia - caused by: Cerebral infarction     Time: 4540-9811 PT Time Calculation (min) (ACUTE ONLY): 21 min  Charges:  $Gait Training: 8-22 mins                    G Codes:       Alben Deeds, PT DPT  [phone]    Duncan Dull 06/22/2016, 10:37 AM

## 2016-06-22 NOTE — Progress Notes (Addendum)
PROGRESS NOTE  CASE Donald Richardson QJF:[id number] DOB: 27-Sep-1940 DOA: 06/20/2016 PCP: Wyatt Haste, MD   LOS: 2 days   Brief Narrative: Pleasant 76 year old male with hypertension, hyperlipidemia, coronary artery disease, diabetes, COPD, ongoing tobacco abuse who was admitted on 4/20 with new onset left-sided weakness and inability to walk, found to have an acute stroke on MRI.  Neurology was consulted.  Assessment & Plan: Principal Problem:   CVA (cerebral vascular accident) ([place]) Active Problems:   TOBACCO ABUSE   Bradycardia   Diabetes mellitus type 2, controlled, with complications ([place])   Essential hypertension   RBBB   CKD (chronic kidney disease), stage III   Right caudate stroke -Patient was admitted to the hospital with left-sided weakness.  Initial MRI showed acute infarct of the right caudate tail, extending inferiorly along the posterior limb of the right internal capsule -Patient had workup for stroke, 2D echo showed normal EF in the range of 50-55%, hypokinesis of the apical inferior myocardium and grade 2 diastolic dysfunction. -Carotid duplex showed 1-39% ICA plaque with antegrade vertebral artery flow -His LDL is 45, A1c is pending.  He is already on a statin.  Sinus bradycardia with Right bundle-branch block -Was also present on the EKG was done in 2017.  Avoid beta-blockers. -he is asymptomatic, telemetry stable  Essential hypertension -Hold lisinopril to allow permissive hypertension -Blood pressure 150/64 this morning  Hyperlipidemia -Continue Crestor  Chronic kidney disease stage III -Baseline creatinine between 1.3 and 1.6.  Currently at baseline at 1.3.  Diabetes mellitus type 2, diet controlled -Patient currently not on any oral agents. -A1c pending  Thrombocytopenia -Chronic, stable, no bleeding.  Hypocalcemia -Repleted  Tobacco abuse -Counseled for cessation, discussed with patient today   DVT prophylaxis: Lovenox Code Status: Full  code Family Communication: Discussed with patient's brother Disposition Plan: Inpatient rehab to evaluate patient tomorrow  Consultants:   Neurology  Procedures:   2D echo Study Conclusions - Left ventricle: The cavity size was normal. Wall thickness was increased in a pattern of moderate LVH. Systolic function was normal. The estimated ejection fraction was in the range of 50% to 55%. There is hypokinesis of the mid-apicalinferior myocardium. Features are consistent with a pseudonormal left ventricular filling pattern, with concomitant abnormal relaxation and increased filling pressure (grade 2 diastolic dysfunction). - Aortic root: The aortic root was mildly dilated. - Pulmonary arteries: Systolic pressure was mildly increased. PA peak pressure: 37 mm Hg (S).  Antimicrobials:  None   Subjective: -Overall feels okay however is unable to walk, no chest pain, no palpitations, shortness of breath  Objective: Vitals:   06/21/16 2100 06/22/16 0100 06/22/16 0521 06/22/16 0944  BP: (!) 141/64 (!) 156/63 128/60 (!) 152/64  Pulse: (!) 45 (!) 40 (!) 45 (!) 47  Resp: 17 18 18 18   Temp: 98.2 F (36.8 C) 98.7 F (37.1 C) 98.6 F (37 C) 98.7 F (37.1 C)  TempSrc: Oral Oral Oral Oral  SpO2: 97% 94% 94% 94%  Weight:      Height:        Intake/Output Summary (Last 24 hours) at 06/22/16 1314 Last data filed at 06/22/16 0521  Gross per 24 hour  Intake                0 ml  Output              300 ml  Net             -300 ml   [employer]  06/21/16 0058  Weight: 67.1 kg (147 lb 14.4 oz)    Examination:  Vitals:   06/21/16 2100 06/22/16 0100 06/22/16 0521 06/22/16 0944  BP: (!) 141/64 (!) 156/63 128/60 (!) 152/64  Pulse: (!) 45 (!) 40 (!) 45 (!) 47  Resp: 17 18 18 18   Temp: 98.2 F (36.8 C) 98.7 F (37.1 C) 98.6 F (37 C) 98.7 F (37.1 C)  TempSrc: Oral Oral Oral Oral  SpO2: 97% 94% 94% 94%  Weight:      Height:       Constitutional: NAD, calm, comfortable Eyes:  PERRL, lids and conjunctivae normal Respiratory: clear to auscultation bilaterally, no wheezing, no crackles. Normal respiratory effort.  Cardiovascular: Regular rate and rhythm, no murmurs / rubs / gallops. No LE edema. 2+ pedal pulses.  Abdomen: no tenderness. Bowel sounds positive.  Neurologic: non focal, weakness in the left upper and left lower extremity present    Data Reviewed: I have personally reviewed following labs and imaging studies  CBC:  Recent Labs Lab 06/20/16 1804 06/20/16 1829 06/21/16 0529  WBC 10.2  --  6.2  NEUTROABS 7.9*  --  4.1  HGB 15.3 15.6 13.5  HCT 45.8 46.0 40.5  MCV 89.6  --  89.8  PLT 117*  --  062*   Basic Metabolic Panel:  Recent Labs Lab 06/20/16 1804 06/20/16 1829 06/21/16 0529  NA 138 140 140  K 3.7 3.6 3.6  CL 104 104 106  CO2 23  --  27  GLUCOSE 115* 111* 114*  BUN 18 21* 17  CREATININE 1.40* 1.40* 1.29*  CALCIUM 8.8*  --  8.4*   GFR: Estimated Creatinine Clearance: 47.7 mL/min (A) (by C-G formula based on SCr of 1.29 mg/dL (H)). Liver Function Tests:  Recent Labs Lab 06/20/16 1804  AST 20  ALT 12*  ALKPHOS 79  BILITOT 0.9  PROT 6.6  ALBUMIN 3.7   No results for input(s): LIPASE, AMYLASE in the last 168 hours. No results for input(s): AMMONIA in the last 168 hours. Coagulation Profile:  Recent Labs Lab 06/20/16 1804  INR 1.08   Cardiac Enzymes:  Recent Labs Lab 06/21/16 0529  TROPONINI <0.03   BNP (last 3 results) No results for input(s): PROBNP in the last 8760 hours. HbA1C: No results for input(s): HGBA1C in the last 72 hours. CBG: No results for input(s): GLUCAP in the last 168 hours. Lipid Profile:  Recent Labs  06/21/16 0529  CHOL 89  HDL 30*  LDLCALC 45  TRIG 68  CHOLHDL 3.0   Thyroid Function Tests: No results for input(s): TSH, T4TOTAL, FREET4, T3FREE, THYROIDAB in the last 72 hours. Anemia Panel: No results for input(s): VITAMINB12, FOLATE, FERRITIN, TIBC, IRON, RETICCTPCT in the  last 72 hours. Urine analysis: No results found for: COLORURINE, APPEARANCEUR, LABSPEC, PHURINE, GLUCOSEU, HGBUR, BILIRUBINUR, KETONESUR, PROTEINUR, UROBILINOGEN, NITRITE, LEUKOCYTESUR Sepsis Labs: Invalid input(s): PROCALCITONIN, LACTICIDVEN  No results found for this or any previous visit (from the past 240 hour(s)).    Radiology Studies: Dg Chest 2 View  Result Date: 06/21/2016 CLINICAL DATA:  Status post CVA.  Initial encounter. EXAM: CHEST  2 VIEW COMPARISON:  Chest radiograph performed 07/23/2012, and CT of the chest performed 09/02/2012 FINDINGS: The lungs are well-aerated. Mild left basilar airspace opacity raises concern for pneumonia. There is no evidence of pleural effusion or pneumothorax. The heart is borderline normal in size. No acute osseous abnormalities are seen. IMPRESSION: Mild left basilar airspace opacity raises concern for pneumonia. Electronically Signed  By: Garald Balding M.D.   On: 06/21/2016 00:44   Ct Head Wo Contrast  Result Date: 06/20/2016 CLINICAL DATA:  Left-sided weakness since Wednesday EXAM: CT HEAD WITHOUT CONTRAST TECHNIQUE: Contiguous axial images were obtained from the base of the skull through the vertex without intravenous contrast. COMPARISON:  09/22/2007 FINDINGS: Brain: No territorial infarct is visualized. There is moderate atrophy. Periventricular white matter hypodensity consistent with small vessel ischemic change. Old appearing infarcts in the left basal ganglia. 9 mm mildly dense focus near the head of left caudate. Not much surrounding edema or mass effect. The ventricles are slightly enlarged, likely related to atrophy. Vascular: Vertebral artery and basilar artery calcification. Carotid artery calcification. No hyperdense vessels. Skull: No fracture or suspicious bone lesion. Sinuses/Orbits: Mucosal thickening in the maxillary and ethmoid sinuses. No acute orbital abnormality. Bilateral lens extraction. Other: None IMPRESSION: 1. 9 mm hyperdense  focus near the head of the left caudate ; on certain if this represents mild calcification, sequelae of prior hemorrhage, or a small hyperdense mass. No significant surrounding edema or mass effect. MRI is recommended for further evaluation. 2. Atrophy and is bilateral white matter small vessel ischemic changes with old infarcts in the left basal ganglia. Electronically Signed   By: Donavan Foil M.D.   On: 06/20/2016 18:53   Mr Angiogram Head Wo Contrast  Result Date: 06/20/2016 CLINICAL DATA:  Left-sided weakness and difficulty walking EXAM: MRI HEAD WITHOUT CONTRAST MRA HEAD WITHOUT CONTRAST TECHNIQUE: Multiplanar, multiecho pulse sequences of the brain and surrounding structures were obtained without intravenous contrast. Angiographic images of the head were obtained using MRA technique without contrast. COMPARISON:  Head CT 06/20/2016 FINDINGS: MRI HEAD FINDINGS Brain: There is focal diffusion restriction within the tail of the right caudate, extending inferiorly near the posterior limb of the right internal capsule. No other diffusion restriction. There is beginning confluent hyperintense T2-weighted signal within the periventricular white matter, most often seen in the setting of chronic microvascular ischemia. There is an enlarged perivascular space of the left lentiform nucleus. 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 There is a focus of chronic microhemorrhage in the left occipital lobe. No hydrocephalus or extra-axial fluid collection. The midline structures are normal. There is age advanced atrophy without lobar predilection. Skull and upper cervical spine: The visualized skull base, calvarium, upper cervical spine and extracranial soft tissues are normal. Sinuses/Orbits: No fluid levels or advanced mucosal thickening. No mastoid effusion. Normal orbits. MRA HEAD FINDINGS Intracranial internal carotid  arteries: Normal. Anterior cerebral arteries: Normal. Middle cerebral arteries: Normal. Posterior communicating arteries: Absent bilaterally. Posterior cerebral arteries: Normal. Basilar artery: Normal. Vertebral arteries: Left dominant. Normal. Superior cerebellar arteries: Duplicated bilaterally. Anterior inferior cerebellar arteries: Normal. Posterior inferior cerebellar arteries: Normal. IMPRESSION: 1. Acute infarct of the right caudate tail, extending inferiorly along the posterior limb of the right internal capsule. This is in keeping with the reported left-sided weakness. 2. No acute hemorrhage or mass effect. 3. 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 4. Normal intracranial MRA. 5. Chronic microvascular ischemia and age advanced atrophy. Electronically Signed   By: Ulyses Jarred M.D.   On: 06/20/2016 22:18   Mr Brain Wo Contrast  Result Date: 06/20/2016 CLINICAL DATA:  Left-sided weakness and difficulty walking EXAM: MRI HEAD WITHOUT CONTRAST MRA HEAD WITHOUT CONTRAST TECHNIQUE: Multiplanar, multiecho pulse  sequences of the brain and surrounding structures were obtained without intravenous contrast. Angiographic images of the head were obtained using MRA technique without contrast. COMPARISON:  Head CT 06/20/2016 FINDINGS: MRI HEAD FINDINGS Brain: There is focal diffusion restriction within the tail of the right caudate, extending inferiorly near the posterior limb of the right internal capsule. No other diffusion restriction. There is beginning confluent hyperintense T2-weighted signal within the periventricular white matter, most often seen in the setting of chronic microvascular ischemia. There is an enlarged perivascular space of the left lentiform nucleus. 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 There is a focus of chronic  microhemorrhage in the left occipital lobe. No hydrocephalus or extra-axial fluid collection. The midline structures are normal. There is age advanced atrophy without lobar predilection. Skull and upper cervical spine: The visualized skull base, calvarium, upper cervical spine and extracranial soft tissues are normal. Sinuses/Orbits: No fluid levels or advanced mucosal thickening. No mastoid effusion. Normal orbits. MRA HEAD FINDINGS Intracranial internal carotid arteries: Normal. Anterior cerebral arteries: Normal. Middle cerebral arteries: Normal. Posterior communicating arteries: Absent bilaterally. Posterior cerebral arteries: Normal. Basilar artery: Normal. Vertebral arteries: Left dominant. Normal. Superior cerebellar arteries: Duplicated bilaterally. Anterior inferior cerebellar arteries: Normal. Posterior inferior cerebellar arteries: Normal. IMPRESSION: 1. Acute infarct of the right caudate tail, extending inferiorly along the posterior limb of the right internal capsule. This is in keeping with the reported left-sided weakness. 2. No acute hemorrhage or mass effect. 3. 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 4. Normal intracranial MRA. 5. Chronic microvascular ischemia and age advanced atrophy. Electronically Signed   By: Ulyses Jarred M.D.   On: 06/20/2016 22:18     Scheduled Meds: .  stroke: mapping our early stages of recovery book   Does not apply Once  . aspirin EC  325 mg Oral Daily  . clopidogrel  75 mg Oral Daily  . enoxaparin (LOVENOX) injection  40 mg Subcutaneous Q24H  . rosuvastatin  20 mg Oral QHS   Continuous Infusions:    Marzetta Board, MD, PhD [facility] Pager [phone]  If 7PM-7AM, please contact night-coverage [email or web address] Password [email or web address] 06/22/2016, 1:14 PM

## 2016-06-22 NOTE — Progress Notes (Signed)
Received a call form tele that patient had four beats of V Tach. Patient is asymptomatic at this time. MD paged.

## 2016-06-22 NOTE — Progress Notes (Signed)
VASCULAR LAB PRELIMINARY  PRELIMINARY  PRELIMINARY  PRELIMINARY  Carotid duplex completed.    Preliminary report:  1-39% ICA plaquing. Vertebral artery flow is antegrade.   Donald Richardson, RVT 06/22/2016, 8:48 AM

## 2016-06-22 NOTE — Progress Notes (Signed)
Patient walked assisted using gait belt, front wheel walker from end of hall to nurses station and back. Assisted into recliner chair in room with call light in reach.  Chair alarm activated.

## 2016-06-22 NOTE — Progress Notes (Signed)
STROKE TEAM PROGRESS NOTE   SUBJECTIVE (INTERVAL HISTORY) No family is at the bedside. Pt sitting in chair for lunch. No complains. PT recommend CIR.    OBJECTIVE Temp:  [97.9 F (36.6 C)-98.7 F (37.1 C)] 98.7 F (37.1 C) (04/22 0944) Pulse Rate:  [40-48] 47 (04/22 0944) Cardiac Rhythm: Sinus bradycardia (04/22 0900) Resp:  [17-18] 18 (04/22 0944) BP: (122-156)/(60-67) 152/64 (04/22 0944) SpO2:  [94 %-97 %] 94 % (04/22 0944)  CBC:   Recent Labs Lab 06/20/16 1804 06/20/16 1829 06/21/16 0529  WBC 10.2  --  6.2  NEUTROABS 7.9*  --  4.1  HGB 15.3 15.6 13.5  HCT 45.8 46.0 40.5  MCV 89.6  --  89.8  PLT 117*  --  105*    Basic Metabolic Panel:   Recent Labs Lab 06/20/16 1804 06/20/16 1829 06/21/16 0529  NA 138 140 140  K 3.7 3.6 3.6  CL 104 104 106  CO2 23  --  27  GLUCOSE 115* 111* 114*  BUN 18 21* 17  CREATININE 1.40* 1.40* 1.29*  CALCIUM 8.8*  --  8.4*    Lipid Panel:     Component Value Date/Time   CHOL 89 06/21/2016 0529   TRIG 68 06/21/2016 0529   HDL 30 (L) 06/21/2016 0529   CHOLHDL 3.0 06/21/2016 0529   VLDL 14 06/21/2016 0529   LDLCALC 45 06/21/2016 0529   HgbA1c:  Lab Results  Component Value Date   HGBA1C 6.2 03/24/2016   Urine Drug Screen: No results found for: LABOPIA, COCAINSCRNUR, LABBENZ, AMPHETMU, THCU, LABBARB  Alcohol Level No results found for: [place] I have personally reviewed the radiological images below and agree with the radiology interpretations.  Dg Chest 2 View 06/21/2016 Mild left basilar airspace opacity raises concern for pneumonia.   Ct Head Wo Contrast 06/20/2016 1. 9 mm hyperdense focus near the head of the left caudate ; uncertain if this represents mild calcification, sequelae of prior hemorrhage, or a small hyperdense mass. No significant surrounding edema or mass effect. MRI is recommended for further evaluation.  2. Atrophy and is bilateral white matter small vessel ischemic changes with old infarcts in  the left basal ganglia.   Mri and Mra Head Wo Contrast 06/20/2016 1. Acute infarct of the right caudate tail, extending inferiorly along the posterior limb of the right internal capsule. This is in keeping with the reported left-sided weakness.  2. No acute hemorrhage or mass effect.  3. 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  4. Normal intracranial MRA.  5. Chronic microvascular ischemia and age advanced atrophy.   CUS - Preliminary report:  1-39% ICA plaquing. Vertebral artery flow is antegrade.  TTE  06/21/2016 Study Conclusions - Left ventricle: The cavity size was normal. Wall thickness was   increased in a pattern of moderate LVH. Systolic function was   normal. The estimated ejection fraction was in the range of 50% to 55%.    There is hypokinesis of the mid-apicalinferior   myocardium. Features are consistent with a pseudonormal left   ventricular filling pattern, with concomitant abnormal relaxation   and increased filling pressure (grade 2 diastolic dysfunction). - Aortic root: The aortic root was mildly dilated. - Pulmonary arteries: Systolic pressure was mildly increased. PA   peak pressure: 37 mm Hg (S). Impressions: - Pt bradycardic during the study with HR high 30s and low 40s.   Hypokinesis of the  distal inferior wall with overall preserved LV   systolic function; moderate LVH; grade 2 diastolic dysfunction;   mildly dilated aortic root; mild TR with mildly elevated   pulmonary pressure.   PHYSICAL EXAM  Temp:  [97.9 F (36.6 C)-98.7 F (37.1 C)] 98.7 F (37.1 C) (04/22 0944) Pulse Rate:  [40-48] 47 (04/22 0944) Resp:  [17-18] 18 (04/22 0944) BP: (122-156)/(60-67) 152/64 (04/22 0944) SpO2:  [94 %-97 %] 94 % (04/22 0944)  General - Well nourished, well developed, in no apparent distress.  Ophthalmologic - Fundi not visualized due to noncooperation.  Cardiovascular - Regular rhythm but  bradycardia.  Mental Status -  Level of arousal and orientation to time, place, and person were intact. Language including expression, naming, repetition, comprehension was assessed and found intact. Fund of Knowledge was assessed and was impaired  Cranial Nerves II - XII - II - Visual field intact OU. III, IV, VI - Extraocular movements intact. V - Facial sensation intact bilaterally. VII - Facial movement intact bilaterally. VIII - Hearing & vestibular intact bilaterally. X - Palate elevates symmetrically. XI - Chin turning & shoulder shrug intact bilaterally. XII - Tongue protrusion intact.  Motor Strength - The patient's strength was normal in RUE and RLE, but 5-/5 LUE and LLE with pronator drift was present on the left and left foot 4+/5 DF.  Bulk was normal and fasciculations were absent.   Motor Tone - Muscle tone was assessed at the neck and appendages and was normal except mild cogwheeling on the right LE.  Reflexes - The patient's reflexes were 1+ in all extremities and he had no pathological reflexes.  Sensory - Light touch, temperature/pinprick were assessed and were symmetrical.    Coordination - The patient had normal movements in the hands and feet with no ataxia or dysmetria.  R hand resting tremor, right LE mild cog-wheeling.  Gait and Station - deferred   ASSESSMENT/PLAN Donald Richardson is a 76 y.o. male with history of tobacco use, hypertension, previous strokes by imaging hyperlipidemia, diabetes mellitus, COPD, coronary artery disease s/p stent presenting with weakness and sensory changes of the left lower extremity. He did not receive IV t-PA due to presentation.  Stroke:  acute infarct right CR, likely secondary to small vessel disease, risk factor including smoking, DM, HTN, HLD, CAD s/p stent  Resultant  subtle left UE and LE weakness  MRI - Acute infarct right CR. Old left caudate head and left pontine lacune  MRA - left PCA stenosis. 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 Bradycardic 30's - 40's  LDL - 45  HgbA1c - pending  VTE prophylaxis - Lovenox Diet heart healthy/carb modified Room service appropriate? Yes; Fluid consistency: Thin  aspirin 81 mg daily and clopidogrel 75 mg daily prior to admission, now on aspirin 325 mg daily and clopidogrel 75 mg daily. Continue DAPT on discharge for cardiac and stroke prevention  Patient counseled to be compliant with his antithrombotic medications  Ongoing aggressive stroke risk factor management  Therapy recommendations: CIR   Disposition: Pending  Hypertension  Blood pressure tends to run low - 117/61  Permissive hypertension (OK if < 220/120) but gradually normalize in 5-7 days  Long-term BP goal normotensive  Hyperlipidemia  Home meds:  Crestor 20 mg daily resumed in hospital  LDL 45, goal < 70  Continue statin at discharge  Diabetes  HgbA1c pending, goal < 7.0  Controlled  Tobacco abuse  Current smoker  Smoking cessation counseling provided  Nicotine patch provided  Pt is willing to quit  Other Stroke Risk Factors  Advanced age  Hx stroke/TIA - by imaging  CAD s/p stenting  Other Active Problems  Elevated creatinine - 1.40 -> 1.29  Hand tremor - no significant PD symptoms, no fall at home and denies walking difficult PTA - outpt follow up  Bradycardia - 30's - 40's  - not new per IM note.  Thrombocytopenia - [facility] day # 2  Neurology will sign off. Please call with questions. Pt will follow up with Cecille Rubin NP at [facility] in about 6 weeks. Thanks for the consult.  Rosalin Hawking, MD PhD Stroke Neurology 06/22/2016 12:29 PM   To contact Stroke Continuity provider, please refer to [email or web address] After hours, contact General Neurology

## 2016-06-23 DIAGNOSIS — I10 Essential (primary) hypertension: Secondary | ICD-10-CM

## 2016-06-23 DIAGNOSIS — E118 Type 2 diabetes mellitus with unspecified complications: Secondary | ICD-10-CM

## 2016-06-23 DIAGNOSIS — R001 Bradycardia, unspecified: Secondary | ICD-10-CM

## 2016-06-23 DIAGNOSIS — I63311 Cerebral infarction due to thrombosis of right middle cerebral artery: Secondary | ICD-10-CM

## 2016-06-23 DIAGNOSIS — I5043 Acute on chronic combined systolic (congestive) and diastolic (congestive) heart failure: Secondary | ICD-10-CM | POA: Insufficient documentation

## 2016-06-23 DIAGNOSIS — D696 Thrombocytopenia, unspecified: Secondary | ICD-10-CM

## 2016-06-23 DIAGNOSIS — N183 Chronic kidney disease, stage 3 (moderate): Secondary | ICD-10-CM

## 2016-06-23 DIAGNOSIS — F17200 Nicotine dependence, unspecified, uncomplicated: Secondary | ICD-10-CM

## 2016-06-23 LAB — COMPREHENSIVE METABOLIC PANEL
ALT: 11 U/L — ABNORMAL LOW (ref 17–63)
ANION GAP: 6 (ref 5–15)
AST: 14 U/L — ABNORMAL LOW (ref 15–41)
Albumin: 3.1 g/dL — ABNORMAL LOW (ref 3.5–5.0)
Alkaline Phosphatase: 65 U/L (ref 38–126)
BILIRUBIN TOTAL: 0.6 mg/dL (ref 0.3–1.2)
BUN: 15 mg/dL (ref 6–20)
CHLORIDE: 107 mmol/L (ref 101–111)
CO2: 26 mmol/L (ref 22–32)
Calcium: 8.6 mg/dL — ABNORMAL LOW (ref 8.9–10.3)
Creatinine, Ser: 1.25 mg/dL — ABNORMAL HIGH (ref 0.61–1.24)
GFR, EST NON AFRICAN AMERICAN: 55 mL/min — AB (ref >60)
Glucose, Bld: 92 mg/dL (ref 65–99)
POTASSIUM: 3.6 mmol/L (ref 3.5–5.1)
Sodium: 139 mmol/L (ref 135–145)
TOTAL PROTEIN: 5.8 g/dL — AB (ref 6.5–8.1)

## 2016-06-23 LAB — CBC
HEMATOCRIT: 41.8 % (ref 39.0–52.0)
Hemoglobin: 13.9 g/dL (ref 13.0–17.0)
MCH: 29.6 pg (ref 26.0–34.0)
MCHC: 33.3 g/dL (ref 30.0–36.0)
MCV: 89.1 fL (ref 78.0–100.0)
Platelets: 106 10*3/uL — ABNORMAL LOW (ref 150–400)
RBC: 4.69 MIL/uL (ref 4.22–5.81)
RDW: 14.2 % (ref 11.5–15.5)
WBC: 7 10*3/uL (ref 4.0–10.5)

## 2016-06-23 NOTE — Progress Notes (Signed)
Occupational Therapy Treatment Patient Details Name: Donald Richardson MRN: [id number] DOB: April 06, 1940 Today's Date: 06/23/2016    History of present illness  Donald Richardson is a 76 y.o. male with medical history significant of HTN, HLD, CAD, DM type 2, COPD,  tobacco abuse thrombocytopenia; who presents with left-sided weakness and inability to walk. Symptoms possibly started late afternoon 2 days ago. Fell twice at home.  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 EKG showed R bundle branch block, pt with sustained bradycardia.   OT comments  Pt progressing toward OT goals. Challenged dynamic sitting balance during ADL tasks with L UE reaching tasks in PNF patterns. Pt did demonstrate impulsivity during session but demonstrates improved functional use of L UE overall. He required min assist for toilet transfers and mod assist for toileting hygiene. With fatigue at end of session, noted decreased balance and increasing difficulty with higher level cognitive skills. Pt remains an excellent candidate for CIR level therapies post-acute D/C and OT will continue to follow while admitted.   Follow Up Recommendations  CIR    Equipment Recommendations  Other (comment) (TBD at next venue of care)    Recommendations for Other Services Rehab consult    Precautions / Restrictions Precautions Precautions: Fall Restrictions Weight Bearing Restrictions: No       Mobility Bed Mobility               General bed mobility comments: OOB in chair on arrival.  Transfers Overall transfer level: Needs assistance Equipment used: Rolling walker (2 wheeled) Transfers: Sit to/from Stand Sit to Stand: Min assist         General transfer comment: Min assist for steadying and VC's for safe hand placement with RW.    Balance Overall balance assessment: Needs assistance Sitting-balance support: Single extremity supported;No upper extremity supported;Feet supported Sitting  balance-Leahy Scale: Fair Sitting balance - Comments: Prefers single  Postural control: Left lateral lean Standing balance support: Bilateral upper extremity supported;During functional activity;No upper extremity supported Standing balance-Leahy Scale: Poor Standing balance comment: L lateral lean and requires UE support to maintain balance in standing position.                           ADL either performed or assessed with clinical judgement   ADL Overall ADL's : Needs assistance/impaired Eating/Feeding: Set up;Sitting Eating/Feeding Details (indicate cue type and reason): Assist to open container of peaches. Grooming: Wash/dry hands;Wash/dry face;Oral care;Minimal assistance;Standing Grooming Details (indicate cue type and reason): Assistance for balance and VC's for awareness and to prevent lateral leaning.                 Toilet Transfer: Minimal assistance;Ambulation;RW Toilet Transfer Details (indicate cue type and reason): Vc for safety and sequencing with RW as well as for upright posturing. Toileting- Clothing Manipulation and Hygiene: Moderate assistance;Sit to/from stand Toileting - Clothing Manipulation Details (indicate cue type and reason): Mod assist for balance during pericare and as pt fatigued, required assistance to thoroughness.     Functional mobility during ADLs: Minimal assistance;Rolling walker;Cueing for safety;Cueing for sequencing General ADL Comments: Pt demonstrating emergent awareness during session. Noted decreased L hand fine motor coordination when attempting to pick up small hearing aid battery.     Vision   Vision Assessment?: No apparent visual deficits Additional Comments: Locating items on tray well.   Perception     Praxis      Cognition Arousal/Alertness: Awake/alert  Overall Cognitive Status: No family/caregiver present to determine baseline cognitive functioning                                  General Comments: Pt reporting decreased awareness that he had urinated this am requiring assistance for bath from nursing staff. Pt with decreasede overall safety awareness and moving quickly. On entry into room pt shoveling food into mouth quickly.        Exercises Other Exercises Other Exercises: Challenged dynamic sitting balance during ADL tasks with functional reaching in PNF patterns with L UE.   Shoulder Instructions       General Comments      Pertinent Vitals/ Pain       Pain Assessment: No/denies pain  Home Living                                          Prior Functioning/Environment              Frequency  Min 3X/week        Progress Toward Goals  OT Goals(current goals can now be found in the care plan section)  Progress towards OT goals: Progressing toward goals  Acute Rehab OT Goals Patient Stated Goal: [place] OT Goal Formulation: With patient Time For Goal Achievement: 07/05/16 Potential to Achieve Goals: Good ADL Goals Pt Will Perform Grooming: with modified independence;standing Pt Will Perform Upper Body Bathing: with modified independence;standing Pt Will Perform Lower Body Bathing: with modified independence;sit to/from stand Pt Will Transfer to Toilet: with modified independence;ambulating;regular height toilet Pt Will Perform Toileting - Clothing Manipulation and hygiene: with modified independence;sit to/from stand  Plan Discharge plan remains appropriate    Co-evaluation                 End of Session Equipment Utilized During Treatment: Gait belt;Rolling walker  OT Visit Diagnosis: Unsteadiness on feet (R26.81);Other symptoms and signs involving the nervous system (R29.898);Hemiplegia and hemiparesis Hemiplegia - Right/Left: Left Hemiplegia - dominant/non-dominant: Non-Dominant Hemiplegia - caused by: Cerebral infarction   Activity Tolerance Patient tolerated treatment  well   Patient Left in chair;with call bell/phone within reach;with nursing/sitter in room   Nurse Communication Mobility status        Time: 9622-2979 OT Time Calculation (min): 38 min  Charges: OT General Charges $OT Visit: 1 Procedure OT Treatments $Self Care/Home Management : 38-52 mins  Donald Herrlich, MS OTR/L  Pager: [place] A Harshitha Fretz 06/23/2016, 9:16 AM

## 2016-06-23 NOTE — Consult Note (Signed)
Physical Medicine and Rehabilitation Consult Reason for Consult: Left side weakness Referring Physician: [facility]   HPI: Donald Richardson is a 76 y.o. right handed male with history of tobacco abuse, thrombocytopenia, arteriosclerotic heart disease, diabetes mellitus, hyperlipidemia and hypertension. History taken from chart review. Patient lives alone and was independent prior to admission. One level home with 2 steps to entry. Closest family is in [place]. Presented 06/20/2016 for left-sided weakness and fall 2 without loss of consciousness. CT of the head showed a 9 mm hyperdense focus near the head of the left caudate no significant surrounding edema or mass effect. MRI reviewed, showing right caudate infarct. Per report, acute infarct of the right caudate tail extending inferiorly along the posterior limb of the right internal capsule. MRA unremarkable. Carotid Dopplers in no ICA stenosis. Echocardiogram with ejection fraction of 27% grade 2 diastolic dysfunction. Neurology consulted presently on aspirin and Plavix for CVA prophylaxis. Subcutaneous Lovenox for DVT prophylaxis. Tolerating a regular consistency diet. Physical therapy evaluation completed with recommendations of physical medicine rehabilitation consult.   Review of Systems  Constitutional: Negative for chills and fever.  HENT: Negative for hearing loss.   Eyes: Negative for blurred vision and double vision.  Respiratory: Positive for shortness of breath.   Cardiovascular: Negative for chest pain, palpitations and leg swelling.  Gastrointestinal: Positive for constipation. Negative for nausea and vomiting.  Genitourinary: Positive for urgency. Negative for hematuria.  Musculoskeletal: Negative for joint pain and myalgias.  Skin: Negative for rash.  Neurological: Positive for sensory change, focal weakness and weakness.  All other systems reviewed and are negative.  Past Medical History:  Diagnosis Date    . ASHD (arteriosclerotic heart disease)    STENT  . COPD (chronic obstructive pulmonary disease) ([place])   . Diabetes mellitus    Hgb A1C- 5.8 in May 2017  . Hemorrhoids   . Hyperlipidemia   . Hypertension   . Smoker    Past Surgical History:  Procedure Laterality Date  . INGUINAL HERNIA REPAIR Left 10/26/2015   Procedure: LEFT INGUINAL HERNIA REPAIR WITH MESH;  Surgeon: Jackolyn Confer, MD;  Location: [facility];  Service: General;  Laterality: Left;  . INSERTION OF MESH Left 10/26/2015   Procedure: INSERTION OF MESH;  Surgeon: Jackolyn Confer, MD;  Location: [facility];  Service: General;  Laterality: Left;  . NO PAST SURGERIES     Family History  Problem Relation Age of Onset  . Breast cancer Mother   . Parkinson's disease Father    Social History:  reports that he has been smoking Cigarettes.  He has been smoking about 1.00 pack per day. He has never used smokeless tobacco. He reports that he does not drink alcohol or use drugs. Allergies: No Known Allergies Medications Prior to Admission  Medication Sig Dispense Refill  . aspirin EC 81 MG tablet Take 81 mg by mouth daily.    . clopidogrel (PLAVIX) 75 MG tablet Take 75 mg by mouth daily.     Marland Kitchen lisinopril (PRINIVIL,ZESTRIL) 10 MG tablet Take 1 tablet (10 mg total) by mouth daily. 90 tablet 3  . rosuvastatin (CRESTOR) 40 MG tablet Take 1 tablet (40 mg total) by mouth daily. (Patient taking differently: Take 20 mg by mouth at bedtime. ) 90 tablet 3    Home: Home Living Family/patient expects to be discharged to:: Private residence Living Arrangements: Alone Available Help at Discharge: Friend(s), Available PRN/intermittently Type of Home: House Home Access: Stairs to enter Entrance  Stairs-Number of Steps: 2 Entrance Stairs-Rails: None Home Layout: One level Bathroom Shower/Tub: Chiropodist: Standard Bathroom Accessibility: Yes Home Equipment: None Additional Comments: loves airplanes  Functional  History: Prior Function Level of Independence: Independent Comments: pt drives Functional Status:  Mobility: Bed Mobility Overal bed mobility: Needs Assistance Bed Mobility: Sit to Supine Sit to supine: Supervision General bed mobility comments: pt received up with OT. Sit to supine with supervision, able to position self in bed with increased time Transfers Overall transfer level: Needs assistance Equipment used: Rolling walker (2 wheeled) Transfers: Sit to/from Stand Sit to Stand: Min assist General transfer comment: min A for steadying and vc's for use of RW, unfamiliar with use of RW prior to this episode Ambulation/Gait Ambulation/Gait assistance: Min assist, Mod assist Ambulation Distance (Feet): 70 Feet Assistive device: Rolling walker (2 wheeled) (30 ft with RW, 70 ft with HHA -moderate assist) Gait Pattern/deviations: Decreased weight shift to left, Decreased step length - left, Decreased dorsiflexion - left, Staggering right General Gait Details: patient able to ambulate with min assist and RW, max multi modal cues for positioning and posture, LLE drag and difficulty with placement. Attempted HHA with higher level tasks patient requiring moderate assist to maintain upright positioning.  Gait velocity: decreased Gait velocity interpretation: <1.8 ft/sec, indicative of risk for recurrent falls    ADL: ADL Overall ADL's : Needs assistance/impaired Eating/Feeding: Modified independent, Sitting Grooming: Wash/dry hands, Wash/dry face, Oral care, Minimal assistance, Standing Grooming Details (indicate cue type and reason): assist for balance, Pt progressively fatigued leaning to left more as time went on, unable to maintain standing for shaving Upper Body Bathing: Min guard, Sitting Lower Body Bathing: Minimal assistance, Sitting/lateral leans Upper Body Dressing : Sitting, Min guard Lower Body Dressing: Minimal assistance, Sitting/lateral leans Toilet Transfer: Moderate  assistance, Cueing for safety, Cueing for sequencing, Ambulation, Regular Toilet, RW Toilet Transfer Details (indicate cue type and reason): vc for safety and sequencing with RW Toileting- Clothing Manipulation and Hygiene: Minimal assistance, Sit to/from stand Toileting - Clothing Manipulation Details (indicate cue type and reason): standing for urination Tub/ Shower Transfer: Tub transfer, Moderate assistance, Ambulation, Rolling walker, 3 in 1 Functional mobility during ADLs: Minimal assistance, Rolling walker, Cueing for safety, Cueing for sequencing General ADL Comments: Pt very motivated to be independent and remain independent  Cognition: Cognition Overall Cognitive Status: No family/caregiver present to determine baseline cognitive functioning Orientation Level: Oriented X4 Cognition Arousal/Alertness: Awake/alert Behavior During Therapy: Impulsive Overall Cognitive Status: No family/caregiver present to determine baseline cognitive functioning General Comments: Pt unaware of drooling during grooming and nursing reported he had urinated in bed and was unaware. Decreased safety awareness.   Blood pressure (!) 154/73, pulse (!) 52, temperature 98.7 F (37.1 C), temperature source Oral, resp. rate 18, height 5\' 8"  (1.727 m), weight 67.1 kg (147 lb 14.4 oz), SpO2 94 %. Physical Exam  Vitals reviewed. Constitutional: He is oriented to person, place, and time. He appears well-developed.  Frail  HENT:  Head: Normocephalic and atraumatic.  Eyes: Conjunctivae and EOM are normal.  Neck: Normal range of motion. Neck supple. No thyromegaly present.  Cardiovascular: Regular rhythm.   Bradycardia  Respiratory: Effort normal and breath sounds normal. No respiratory distress.  GI: Soft. Bowel sounds are normal. He exhibits no distension.  Musculoskeletal: He exhibits no edema or tenderness.  Neurological: He is alert and oriented to person, place, and time.  Follows simple commands.  Fair  awareness of deficits Motor: RUE/RLE: 4+/5 proximal to distal  LUE/LLE: 4/5 proximal to distal DTRs symmetric  Skin: Skin is warm and dry.  Psychiatric: His affect is blunt. His speech is delayed. He is slowed.    No results found for this or any previous visit (from the past 24 hour(s)). No results found.  Assessment/Plan: Diagnosis: Right caudate infact Labs and images independently reviewed.  Records reviewed and summated above. Stroke: Continue secondary stroke prophylaxis and Risk Factor Modification listed below:   Blood Pressure Management:  Continue current medication with prn's with permisive HTN per primary team Statin Agent:   Ddiabetes management:   Tobacco abuse:   Left sided hemiparesis: fit for orthosis to prevent contractures Motor recovery: Fluoxetine   1. Does the need for close, 24 hr/day medical supervision in concert with the patient's rehab needs make it unreasonable for this patient to be served in a less intensive setting? Yes 2. Co-Morbidities requiring supervision/potential complications: tobacco abuse (counsel), Thrombocytopenia (< 60,000/mm3 no resistive exercise), arteriosclerotic heart disease (cont meds), diabetes mellitus (Monitor in accordance with exercise and adjust meds as necessary), hyperlipidemia (cont meds), hypertension (monitor and provide prns in accordance with increased physical exertion and pain), combined CHF (Monitor in accordance with increased physical activity and avoid UE resistance excercises), CKD (avoid nephrotoxic meds) 3. Due to safety, disease management and patient education, does the patient require 24 hr/day rehab nursing? Yes 4. Does the patient require coordinated care of a physician, rehab nurse, PT (1-2 hrs/day, 5 days/week) and OT (1-2 hrs/day, 5 days/week) to address physical and functional deficits in the context of the above medical diagnosis(es)? Yes Addressing deficits in the following areas: balance, endurance,  locomotion, strength, transferring, bathing, dressing, toileting and psychosocial support 5. Can the patient actively participate in an intensive therapy program of at least 3 hrs of therapy per day at least 5 days per week? Yes 6. The potential for patient to make measurable gains while on inpatient rehab is excellent 7. Anticipated functional outcomes upon discharge from inpatient rehab are supervision  with PT, supervision with OT, n/a with SLP. 8. Estimated rehab length of stay to reach the above functional goals is: 16-20 days. 9. Does the patient have adequate social supports and living environment to accommodate these discharge functional goals? Potentially 10. Anticipated D/C setting: TBD 11. Anticipated post D/C treatments: HH therapy and Home excercise program 12. Overall Rehab/Functional Prognosis: good  RECOMMENDATIONS: This patient's condition is appropriate for continued rehabilitative care in the following setting: CIR if caregiver support available at discharge, at pt will likely require some assistance. Patient has agreed to participate in recommended program. Potentially Note that insurance prior authorization may be required for reimbursement for recommended care.  Comment: Rehab Admissions Coordinator to follow up.  Delice Lesch, MD, Mellody Drown Cathlyn Parsons., PA-C 06/23/2016

## 2016-06-23 NOTE — Clinical Social Work Note (Signed)
Clinical Social Work Assessment  Patient Details  Name: Donald Richardson MRN: [id number] Date of Birth: 01-24-1941  Date of referral:  06/23/16               Reason for consult:  Facility Placement, Discharge Planning                Permission sought to share information with:  Facility Sport and exercise psychologist, Family Supports Permission granted to share information::  Yes, Verbal Permission Granted  Name::        Agency::  SNF's  Relationship::     Contact Information:     Housing/Transportation Living arrangements for the past 2 months:  Single Family Home Source of Information:  Patient, Medical Team Patient Interpreter Needed:  None Criminal Activity/Legal Involvement Pertinent to Current Situation/Hospitalization:  No - Comment as needed Significant Relationships:  Siblings, Friend Lives with:  Self Do you feel safe going back to the place where you live?  Yes Need for family participation in patient care:  Yes (Comment)  Care giving concerns:  PT recommending CIR once medically stable for discharge. CSW initiating SNF backup referral.   Social Worker assessment / plan:  CSW met with patient. No supports at bedside. CSW introduced role and explained that PT recommendations would be discussed. Patient agreeable to SNF backup referral. First preference is CIR. No further concerns. CSW encouraged patient to contact CSW as needed. CSW will continue to follow patient for support and facilitate discharge to SNF, if needed, once medically stable.  Employment status:  Retired Forensic scientist:  Medicare PT Recommendations:  Inpatient [place] / Referral to community resources:  [place]  Patient/Family's Response to care:  Patient agreeable to SNF if he cannot go to CIR. Patient's brother and friend supportive and involved in patient's care. Patient appreciated social work intervention.  Patient/Family's Understanding of and Emotional Response to  Diagnosis, Current Treatment, and Prognosis:  Patient appears to have a good understanding of the reason for admission and PT recommendations. Patient appears happy with hospital care.  Emotional Assessment Appearance:  Appears stated age Attitude/Demeanor/Rapport:  Other (Pleasant) Affect (typically observed):  Accepting, Appropriate, Calm, Pleasant Orientation:  Oriented to Self, Oriented to  Time, Oriented to Place, Oriented to Situation Alcohol / Substance use:  Tobacco Use Psych involvement (Current and /or in the community):  No (Comment)  Discharge Needs  Concerns to be addressed:  Care Coordination Readmission within the last 30 days:  No Current discharge risk:  Dependent with Mobility, Lives alone Barriers to Discharge:  Continued Medical Work up   Candie Chroman, LCSW 06/23/2016, 1:07 PM

## 2016-06-23 NOTE — Progress Notes (Signed)
PROGRESS NOTE  Donald Richardson FTD:[id number] DOB: November 08, 1940 DOA: 06/20/2016 PCP: Wyatt Haste, MD   LOS: 3 days   Brief Narrative: Pleasant 76 year old male with hypertension, hyperlipidemia, coronary artery disease, diabetes, COPD, ongoing tobacco abuse who was admitted on 4/20 with new onset left-sided weakness and inability to walk, found to have an acute stroke on MRI.  Neurology was consulted.  Assessment & Plan: Principal Problem:   CVA (cerebral vascular accident) ([place]) Active Problems:   TOBACCO ABUSE   Bradycardia   Diabetes mellitus type 2, controlled, with complications ([place])   Essential hypertension   RBBB   CKD (chronic kidney disease), stage III   Right caudate stroke -Patient was admitted to the hospital with left-sided weakness.  Initial MRI showed acute infarct of the right caudate tail, extending inferiorly along the posterior limb of the right internal capsule -Patient had workup for stroke, 2D echo showed normal EF in the range of 50-55%, hypokinesis of the apical inferior myocardium and grade 2 diastolic dysfunction. -Carotid duplex showed 1-39% ICA plaque with antegrade vertebral artery flow -His LDL is 45, A1c is 5.8.  He is already on a statin. -workup completed, awaiting CIR vs SNF decision  Sinus bradycardia with Right bundle-branch block -Was also present on the EKG was done in 2017.  Avoid beta-blockers. -he is asymptomatic, telemetry stable  Essential hypertension -Hold lisinopril to allow permissive hypertension  Hyperlipidemia -Continue Crestor  Chronic kidney disease stage III -Baseline creatinine between 1.3 and 1.6.  Currently at baseline at 1.3.  Diabetes mellitus type 2, diet controlled -Patient currently not on any oral agents. -A1c pending  Thrombocytopenia -Chronic, stable, no bleeding.  Hypocalcemia -Repleted  Tobacco abuse -Counseled for cessation, discussed with patient today   DVT prophylaxis: Lovenox Code  Status: Full code Family Communication: no family at bedside Disposition Plan: Inpatient rehab to evaluate patient   Consultants:   Neurology  Procedures:   2D echo Study Conclusions - Left ventricle: The cavity size was normal. Wall thickness was increased in a pattern of moderate LVH. Systolic function was normal. The estimated ejection fraction was in the range of 50% to 55%. There is hypokinesis of the mid-apicalinferior myocardium. Features are consistent with a pseudonormal left ventricular filling pattern, with concomitant abnormal relaxation and increased filling pressure (grade 2 diastolic dysfunction). - Aortic root: The aortic root was mildly dilated. - Pulmonary arteries: Systolic pressure was mildly increased. PA peak pressure: 37 mm Hg (S).  Antimicrobials:  None   Subjective: -no complaints other than leg weakness  Objective: Vitals:   06/22/16 2100 06/23/16 0100 06/23/16 0500 06/23/16 0937  BP: (!) 155/65 (!) 151/80 (!) 154/73 (!) 141/69  Pulse: (!) 41 (!) 49 (!) 52 (!) 51  Resp: 18 18 18 20   Temp: 98.8 F (37.1 C) 98.6 F (37 C) 98.7 F (37.1 C) 97.9 F (36.6 C)  TempSrc: Oral Oral Oral Oral  SpO2: 95% 95% 94% 93%  Weight:      Height:        Intake/Output Summary (Last 24 hours) at 06/23/16 1233 Last data filed at 06/23/16 1147  Gross per 24 hour  Intake              480 ml  Output             1100 ml  Net             -620 ml   Filed Weights   06/21/16 0058  Weight: 67.1 kg (147 lb 14.4  oz)    Examination:  Vitals:   06/22/16 2100 06/23/16 0100 06/23/16 0500 06/23/16 0937  BP: (!) 155/65 (!) 151/80 (!) 154/73 (!) 141/69  Pulse: (!) 41 (!) 49 (!) 52 (!) 51  Resp: 18 18 18 20   Temp: 98.8 F (37.1 C) 98.6 F (37 C) 98.7 F (37.1 C) 97.9 F (36.6 C)  TempSrc: Oral Oral Oral Oral  SpO2: 95% 95% 94% 93%  Weight:      Height:       Constitutional: no distress Respiratory: clear to auscultation bilaterally, no wheezing, no crackles.  Normal respiratory effort.  Cardiovascular: Regular rate and rhythm, no murmurs / rubs / gallops. No LE edema. 2+ pedal pulses.  Abdomen: non tender, BS+ Neurologic: non focal, weakness in the left upper and left lower extremity present    Data Reviewed: I have personally reviewed following labs and imaging studies  CBC:  Recent Labs Lab 06/20/16 1804 06/20/16 1829 06/21/16 0529 06/23/16 0548  WBC 10.2  --  6.2 7.0  NEUTROABS 7.9*  --  4.1  --   HGB 15.3 15.6 13.5 13.9  HCT 45.8 46.0 40.5 41.8  MCV 89.6  --  89.8 89.1  PLT 117*  --  105* 354*   Basic Metabolic Panel:  Recent Labs Lab 06/20/16 1804 06/20/16 1829 06/21/16 0529 06/23/16 0548  NA 138 140 140 139  K 3.7 3.6 3.6 3.6  CL 104 104 106 107  CO2 23  --  27 26  GLUCOSE 115* 111* 114* 92  BUN 18 21* 17 15  CREATININE 1.40* 1.40* 1.29* 1.25*  CALCIUM 8.8*  --  8.4* 8.6*   GFR: Estimated Creatinine Clearance: 49.2 mL/min (A) (by C-G formula based on SCr of 1.25 mg/dL (H)). Liver Function Tests:  Recent Labs Lab 06/20/16 1804 06/23/16 0548  AST 20 14*  ALT 12* 11*  ALKPHOS 79 65  BILITOT 0.9 0.6  PROT 6.6 5.8*  ALBUMIN 3.7 3.1*   No results for input(s): LIPASE, AMYLASE in the last 168 hours. No results for input(s): AMMONIA in the last 168 hours. Coagulation Profile:  Recent Labs Lab 06/20/16 1804  INR 1.08   Cardiac Enzymes:  Recent Labs Lab 06/21/16 0529  TROPONINI <0.03   BNP (last 3 results) No results for input(s): PROBNP in the last 8760 hours. HbA1C:  Recent Labs  06/21/16 0529  HGBA1C 5.8*   CBG: No results for input(s): GLUCAP in the last 168 hours. Lipid Profile:  Recent Labs  06/21/16 0529  CHOL 89  HDL 30*  LDLCALC 45  TRIG 68  CHOLHDL 3.0   Thyroid Function Tests: No results for input(s): TSH, T4TOTAL, FREET4, T3FREE, THYROIDAB in the last 72 hours. Anemia Panel: No results for input(s): VITAMINB12, FOLATE, FERRITIN, TIBC, IRON, RETICCTPCT in the last 72  hours. Urine analysis: No results found for: COLORURINE, APPEARANCEUR, LABSPEC, PHURINE, GLUCOSEU, HGBUR, BILIRUBINUR, KETONESUR, PROTEINUR, UROBILINOGEN, NITRITE, LEUKOCYTESUR Sepsis Labs: Invalid input(s): PROCALCITONIN, LACTICIDVEN  No results found for this or any previous visit (from the past 240 hour(s)).    Radiology Studies: No results found.   Scheduled Meds: .  stroke: mapping our early stages of recovery book   Does not apply Once  . aspirin EC  325 mg Oral Daily  . clopidogrel  75 mg Oral Daily  . enoxaparin (LOVENOX) injection  40 mg Subcutaneous Q24H  . rosuvastatin  20 mg Oral QHS   Continuous Infusions:    Marzetta Board, MD, PhD [facility] Pager [phone]  If 7PM-7AM, please contact night-coverage [email or web address] Password [facility] 06/23/2016, 12:33 PM

## 2016-06-23 NOTE — Progress Notes (Signed)
Inpatient Rehabilitation  Met with patient to discuss team's recommendations for IP Rehab.  Shared booklets and answered questions.  Patient fiercly independent and eager to regain this and return home.  Note Dr. Serita Grit social support concern and discussed this case with PT, who feels that Mod I goals will be obtainable.  Plan to review case with medical director in the morning for final approval; however, hopeful for IP Rehab bed offer tomorrow.  Please call with questions.   Carmelia Roller., CCC/SLP Admission Coordinator  [place]  Cell [phone]

## 2016-06-23 NOTE — NC FL2 (Signed)
[place] LEVEL OF CARE SCREENING TOOL     IDENTIFICATION  Patient Name: Donald Richardson Birthdate: [birth year] Sex: male Admission Date (Current Location): 06/20/2016  [facility] and [place] Number:  Herbalist and Address:  [facility]. [facility], [place] [address], [place], [place] [address]      Provider Number: [id number]  Attending Physician Name and Address:  Caren Griffins, MD  Relative Name and Phone Number:       Current Level of Care: Hospital Recommended Level of Care: [place] Prior Approval Number:    Date Approved/Denied:   PASRR Number: [id number]  Discharge Plan: SNF    Current Diagnoses: Patient Active Problem List   Diagnosis Date Noted  . Thrombocytopenia ([place])   . Acute on chronic combined systolic and diastolic CHF (congestive heart failure) ([place])   . RBBB 06/21/2016  . CKD (chronic kidney disease), stage III 06/21/2016  . CVA (cerebral vascular accident) ([place]) 06/20/2016  . H/O cataract extraction 06/26/2014  . COLD (chronic obstructive lung disease) ([place]) 02/27/2014  . Essential hypertension 06/16/2012  . Diabetes mellitus type 2, controlled, with complications ([place]) 18/84/1660  . Hyperlipidemia associated with type 2 diabetes mellitus ([place]) 08/14/2008  . TOBACCO ABUSE 08/14/2008  . CAD, NATIVE VESSEL 08/14/2008  . Bradycardia 08/14/2008    Orientation RESPIRATION BLADDER Height & Weight     Self, Time, Situation, Place  Normal Continent Weight: 147 lb 14.4 oz (67.1 kg) Height:  5\' 8"  (172.7 cm)  BEHAVIORAL SYMPTOMS/MOOD NEUROLOGICAL BOWEL NUTRITION STATUS   (None)  (CVA) Continent Diet (Heart healthy/carb modified)  AMBULATORY STATUS COMMUNICATION OF NEEDS Skin   Limited Assist Verbally Skin abrasions, Other (Comment) (Skin tear)                       Personal Care Assistance Level of Assistance  Bathing, Feeding, Dressing Bathing Assistance: Limited assistance Feeding assistance:  Limited assistance Dressing Assistance: Limited assistance     Functional Limitations Info  Sight, Hearing, Speech Sight Info: Adequate Hearing Info: Adequate Speech Info: Adequate    SPECIAL CARE FACTORS FREQUENCY  PT (By licensed PT), Blood pressure, OT (By licensed OT)     PT Frequency: 5 x week OT Frequency: 5 x week            Contractures Contractures Info: Not present    Additional Factors Info  Code Status, Allergies Code Status Info: Full Allergies Info: NKDA           Current Medications (06/23/2016):  This is the current hospital active medication list Current Facility-Administered Medications  Medication Dose Route Frequency Provider Last Rate Last Dose  .  stroke: mapping our early stages of recovery book   Does not apply Once Norval Morton, MD      . acetaminophen (TYLENOL) tablet 650 mg  650 mg Oral Q4H PRN Norval Morton, MD       Or  . acetaminophen (TYLENOL) solution 650 mg  650 mg Per Tube Q4H PRN Norval Morton, MD       Or  . acetaminophen (TYLENOL) suppository 650 mg  650 mg Rectal Q4H PRN Norval Morton, MD      . aspirin EC tablet 325 mg  325 mg Oral Daily Rosalin Hawking, MD   325 mg at 06/23/16 0837  . clopidogrel (PLAVIX) tablet 75 mg  75 mg Oral Daily Norval Morton, MD   75 mg at 06/23/16 0837  .  enoxaparin (LOVENOX) injection 40 mg  40 mg Subcutaneous Q24H Norval Morton, MD   40 mg at 06/23/16 0836  . ipratropium-albuterol (DUONEB) 0.5-2.5 (3) MG/3ML nebulizer solution 3 mL  3 mL Nebulization Q4H PRN Rondell A Tamala Julian, MD      . nicotine (NICODERM CQ - dosed in mg/24 hours) patch 21 mg  21 mg Transdermal Daily PRN Norval Morton, MD      . rosuvastatin (CRESTOR) tablet 20 mg  20 mg Oral QHS Norval Morton, MD   20 mg at 06/22/16 2208  . senna-docusate (Senokot-S) tablet 1 tablet  1 tablet Oral QHS PRN Norval Morton, MD         Discharge Medications: Please see discharge summary for a list of discharge medications.  Relevant  Imaging Results:  Relevant Lab Results:   Additional Information SS#: [id number]  Candie Chroman, LCSW

## 2016-06-23 NOTE — Clinical Social Work Placement (Signed)
   CLINICAL SOCIAL WORK PLACEMENT  NOTE  Date:  06/23/2016  Patient Details  Name: Donald Richardson MRN: [id number] Date of Birth: February 04, 1941  Clinical Social Work is seeking post-discharge placement for this patient at the [place] level of care (*CSW will initial, date and re-position this form in  chart as items are completed):  Yes   Patient/family provided with [place] Work Department's list of facilities offering this level of care within the geographic area requested by the patient (or if unable, by the patient's family).  Yes   Patient/family informed of their freedom to choose among providers that offer the needed level of care, that participate in Medicare, Medicaid or managed care program needed by the patient, have an available bed and are willing to accept the patient.  Yes   Patient/family informed of [facility]'s ownership interest in [facility] and [facility], as well as of the fact that they are under no obligation to receive care at these facilities.  PASRR submitted to EDS on 06/23/16     PASRR number received on 06/23/16     Existing PASRR number confirmed on       FL2 transmitted to all facilities in geographic area requested by pt/family on 06/23/16     FL2 transmitted to all facilities within larger geographic area on       Patient informed that his/her managed care company has contracts with or will negotiate with certain facilities, including the following:            Patient/family informed of bed offers received.  Patient chooses bed at       Physician recommends and patient chooses bed at      Patient to be transferred to   on  .  Patient to be transferred to facility by       Patient family notified on   of transfer.  Name of family member notified:        PHYSICIAN Please sign FL2     Additional Comment:    _______________________________________________ Candie Chroman, LCSW 06/23/2016, 1:09  PM

## 2016-06-23 NOTE — Care Management Note (Signed)
Case Management Note  Patient Details  Name: Donald Richardson MRN: [id number] Date of Birth: Jun 10, 1940  Subjective/Objective:   Pt admitted for CVA. He is from home alone.                  Action/Plan: PT/OT recommending CIR. CM following for d/c disposition.   Expected Discharge Date:                  Expected Discharge Plan:  [place]  In-House Referral:  Clinical Social Work  Discharge planning Services     Post Acute Care Choice:    Choice offered to:     DME Arranged:    DME Agency:     HH Arranged:    [place] Agency:     Status of Service:  In process, will continue to follow  If discussed at Long Length of Stay Meetings, dates discussed:    Additional Comments:  Pollie Friar, RN 06/23/2016, 1:54 PM

## 2016-06-24 ENCOUNTER — Inpatient Hospital Stay (HOSPITAL_COMMUNITY)
Admission: RE | Admit: 2016-06-24 | Discharge: 2016-07-09 | DRG: 092 | Disposition: A | Discharge status: Home Health | Payer: Medicare Other | Source: Intra-hospital | Attending: Physical Medicine & Rehabilitation | Admitting: Physical Medicine & Rehabilitation

## 2016-06-24 DIAGNOSIS — Z79899 Other long term (current) drug therapy: Secondary | ICD-10-CM

## 2016-06-24 DIAGNOSIS — G8194 Hemiplegia, unspecified affecting left nondominant side: Secondary | ICD-10-CM

## 2016-06-24 DIAGNOSIS — I2510 Atherosclerotic heart disease of native coronary artery without angina pectoris: Secondary | ICD-10-CM | POA: Diagnosis not present

## 2016-06-24 DIAGNOSIS — N183 Chronic kidney disease, stage 3 unspecified: Secondary | ICD-10-CM

## 2016-06-24 DIAGNOSIS — R269 Unspecified abnormalities of gait and mobility: Secondary | ICD-10-CM | POA: Diagnosis not present

## 2016-06-24 DIAGNOSIS — F17210 Nicotine dependence, cigarettes, uncomplicated: Secondary | ICD-10-CM | POA: Diagnosis present

## 2016-06-24 DIAGNOSIS — I679 Cerebrovascular disease, unspecified: Secondary | ICD-10-CM | POA: Diagnosis not present

## 2016-06-24 DIAGNOSIS — R251 Tremor, unspecified: Secondary | ICD-10-CM | POA: Diagnosis present

## 2016-06-24 DIAGNOSIS — E119 Type 2 diabetes mellitus without complications: Secondary | ICD-10-CM | POA: Diagnosis not present

## 2016-06-24 DIAGNOSIS — I69354 Hemiplegia and hemiparesis following cerebral infarction affecting left non-dominant side: Secondary | ICD-10-CM

## 2016-06-24 DIAGNOSIS — E1151 Type 2 diabetes mellitus with diabetic peripheral angiopathy without gangrene: Secondary | ICD-10-CM | POA: Diagnosis present

## 2016-06-24 DIAGNOSIS — I639 Cerebral infarction, unspecified: Secondary | ICD-10-CM

## 2016-06-24 DIAGNOSIS — E785 Hyperlipidemia, unspecified: Secondary | ICD-10-CM | POA: Diagnosis present

## 2016-06-24 DIAGNOSIS — Z955 Presence of coronary angioplasty implant and graft: Secondary | ICD-10-CM | POA: Diagnosis not present

## 2016-06-24 DIAGNOSIS — Z7982 Long term (current) use of aspirin: Secondary | ICD-10-CM

## 2016-06-24 DIAGNOSIS — I129 Hypertensive chronic kidney disease with stage 1 through stage 4 chronic kidney disease, or unspecified chronic kidney disease: Secondary | ICD-10-CM | POA: Diagnosis present

## 2016-06-24 DIAGNOSIS — E8809 Other disorders of plasma-protein metabolism, not elsewhere classified: Secondary | ICD-10-CM | POA: Diagnosis present

## 2016-06-24 DIAGNOSIS — R001 Bradycardia, unspecified: Secondary | ICD-10-CM | POA: Diagnosis not present

## 2016-06-24 DIAGNOSIS — I10 Essential (primary) hypertension: Secondary | ICD-10-CM | POA: Diagnosis not present

## 2016-06-24 DIAGNOSIS — J449 Chronic obstructive pulmonary disease, unspecified: Secondary | ICD-10-CM | POA: Diagnosis present

## 2016-06-24 DIAGNOSIS — Z7902 Long term (current) use of antithrombotics/antiplatelets: Secondary | ICD-10-CM | POA: Diagnosis not present

## 2016-06-24 DIAGNOSIS — R2689 Other abnormalities of gait and mobility: Principal | ICD-10-CM | POA: Diagnosis present

## 2016-06-24 DIAGNOSIS — D696 Thrombocytopenia, unspecified: Secondary | ICD-10-CM | POA: Diagnosis not present

## 2016-06-24 DIAGNOSIS — I69398 Other sequelae of cerebral infarction: Secondary | ICD-10-CM | POA: Diagnosis not present

## 2016-06-24 DIAGNOSIS — I619 Nontraumatic intracerebral hemorrhage, unspecified: Secondary | ICD-10-CM

## 2016-06-24 DIAGNOSIS — Z820 Family history of epilepsy and other diseases of the nervous system: Secondary | ICD-10-CM | POA: Diagnosis not present

## 2016-06-24 DIAGNOSIS — E1122 Type 2 diabetes mellitus with diabetic chronic kidney disease: Secondary | ICD-10-CM | POA: Diagnosis present

## 2016-06-24 MED ORDER — ENOXAPARIN SODIUM 40 MG/0.4ML ~~LOC~~ SOLN: 40.0000 mg | SUBCUTANEOUS | Status: DC

## 2016-06-24 MED ORDER — ROSUVASTATIN CALCIUM 20 MG PO TABS
20.0000 mg | ORAL_TABLET | Freq: Every day | ORAL | Status: DC
  Administered 2016-06-24 – 2016-07-08 (×15): 20 mg via ORAL
  Filled 2016-06-24 (×15): qty 1

## 2016-06-24 MED ORDER — ONDANSETRON HCL 4 MG PO TABS: 4.0000 mg | ORAL_TABLET | Freq: Four times a day (QID) | ORAL | Status: DC | PRN

## 2016-06-24 MED ORDER — ONDANSETRON HCL 4 MG/2ML IJ SOLN: 4.0000 mg | Freq: Four times a day (QID) | INTRAMUSCULAR | Status: DC | PRN

## 2016-06-24 MED ORDER — SENNOSIDES-DOCUSATE SODIUM 8.6-50 MG PO TABS: 1.0000 | ORAL_TABLET | Freq: Every evening | ORAL | Status: DC | PRN

## 2016-06-24 MED ORDER — ASPIRIN 325 MG PO TBEC: 325.0000 mg | DELAYED_RELEASE_TABLET | Freq: Every day | ORAL | 0 refills | Status: DC

## 2016-06-24 MED ORDER — ACETAMINOPHEN 160 MG/5ML PO SOLN: 650.0000 mg | ORAL | Status: DC | PRN

## 2016-06-24 MED ORDER — NICOTINE 21 MG/24HR TD PT24: 21.0000 mg | MEDICATED_PATCH | Freq: Every day | TRANSDERMAL | Status: DC | PRN

## 2016-06-24 MED ORDER — ACETAMINOPHEN 650 MG RE SUPP: 650.0000 mg | RECTAL | Status: DC | PRN

## 2016-06-24 MED ORDER — ACETAMINOPHEN 325 MG PO TABS
650.0000 mg | ORAL_TABLET | ORAL | Status: DC | PRN
  Administered 2016-06-26: 650 mg via ORAL
  Filled 2016-06-24 (×2): qty 2

## 2016-06-24 MED ORDER — CLOPIDOGREL BISULFATE 75 MG PO TABS
75.0000 mg | ORAL_TABLET | Freq: Every day | ORAL | Status: DC
  Administered 2016-06-25 – 2016-07-09 (×15): 75 mg via ORAL
  Filled 2016-06-24 (×15): qty 1

## 2016-06-24 MED ORDER — SORBITOL 70 % SOLN: 30.0000 mL | Freq: Every day | Status: DC | PRN

## 2016-06-24 MED ORDER — ASPIRIN EC 325 MG PO TBEC
325.0000 mg | DELAYED_RELEASE_TABLET | Freq: Every day | ORAL | Status: DC
  Administered 2016-06-25 – 2016-07-09 (×15): 325 mg via ORAL
  Filled 2016-06-24 (×15): qty 1

## 2016-06-24 MED ORDER — IPRATROPIUM-ALBUTEROL 0.5-2.5 (3) MG/3ML IN SOLN: 3.0000 mL | RESPIRATORY_TRACT | Status: DC | PRN

## 2016-06-24 MED ORDER — NICOTINE 21 MG/24HR TD PT24: 21.0000 mg | MEDICATED_PATCH | Freq: Every day | TRANSDERMAL | 0 refills | Status: DC | PRN

## 2016-06-24 MED ORDER — ENOXAPARIN SODIUM 40 MG/0.4ML ~~LOC~~ SOLN
40.0000 mg | SUBCUTANEOUS | Status: DC
Start: 2016-06-25 — End: 2016-07-09
  Administered 2016-06-25 – 2016-07-08 (×14): 40 mg via SUBCUTANEOUS
  Filled 2016-06-24 (×14): qty 0.4

## 2016-06-24 NOTE — Discharge Summary (Addendum)
Physician Discharge Summary  RYVER POBLETE RDE:[id number] DOB: 1940/06/23 DOA: 06/20/2016  PCP: Wyatt Haste, MD  Admit date: 06/20/2016 Discharge date: 06/24/2016  Admitted From: Home Disposition: Inpatient rehab  Recommendations for Outpatient Follow-up:  1. Follow up with PCP in 1-2 weeks 2. Follow-up with neurology in 2 months, Dr. Erlinda Hong  Discharge Condition: stable CODE STATUS: Full code Diet recommendation: heart healthy  HPI: Per Dr. Kandice Hams Donald Richardson is a 76 y.o. male with medical history significant of HTN, HLD, CAD, DM type 2, COPD,  tobacco abuse thrombocytopenia; who presents with left-sided weakness and inability to walk. Symptoms possibly started late afternoon 2 days ago. Reports feeling weak in his left hand and leg. Patient initially thought symptoms would go away but have persisted. Patient reports falling twice at home.  Denies any significant trauma to his head or loss of consciousness. At baseline he notes having a resting tremor. Denies having any symptoms of shortness of breath, change of vision, slurred speech, chest pain, or leg swelling. He lives alone and when visitors came to see him today found him in his drive unable to walk. ED Course: On admission into the emergency department patient was seen to be afebrile, heart rates 41-65, respirations 10-18, blood pressure up to 173/83 and O2 saturations were maintained on room air. Labs revealed platelets of 117, BUN 18, creatinine 1.4. MRI of the brain showed acute stroke of the right caudate. Neurology was consulted, but patient was out of the window for intervention.  Hospital Course: Discharge Diagnoses:  Principal Problem:   CVA (cerebral vascular accident) ([facility]) Active Problems:   TOBACCO ABUSE   Bradycardia   Diabetes mellitus type 2, controlled, with complications ([facility])   Essential hypertension   RBBB   CKD (chronic kidney disease), stage III   Thrombocytopenia ([facility])   Right caudate stroke  -Patient was admitted to the hospital with left-sided weakness.  Initial MRI showed acute infarct of the right caudate tail, extending inferiorly along the posterior limb of the right internal capsule.  Neurology was consulted and have evaluated patient while hospitalized. Patient had workup for stroke, 2D echo showed normal EF in the range of 50-55%, hypokinesis of the apical inferior myocardium and grade 2 diastolic dysfunction. Carotid duplex showed 1-39% ICA plaque with antegrade vertebral artery flow. His LDL is 45, A1c is 5.8.  He is already on a statin.  Was placed on dual antiplatelet therapy per neurology recommendations, which are to be continued on discharge. Sinus bradycardia with Right bundle-branch block -Was also present on the EKG was done in 2017.  Avoid beta-blockers. He is asymptomatic Essential hypertension -continue home medications Hyperlipidemia -Continue Crestor Chronic kidney disease stage III -Baseline creatinine between 1.3 and 1.6.  Currently at baseline at 1.3. Diabetes mellitus type 2, diet controlled -Patient currently not on any oral agents, A1c 5.8 Thrombocytopenia -Chronic, stable, no bleeding. Hypocalcemia -Repleted Tobacco abuse -Counseled for cessation, will need nicotine patches in the rehab   Discharge Instructions  Discharge Instructions    Ambulatory referral to Physical Medicine Rehab    Complete by:  As directed    1 month post hospital stroke follow up     Allergies as of 06/24/2016   No Known Allergies     Medication List    TAKE these medications   aspirin 325 MG EC tablet Take 1 tablet (325 mg total) by mouth daily. What changed:  medication strength  how much to take   clopidogrel 75 MG tablet Commonly  known as:  PLAVIX Take 75 mg by mouth daily.   lisinopril 10 MG tablet Commonly known as:  PRINIVIL,ZESTRIL Take 1 tablet (10 mg total) by mouth daily.   nicotine 21 mg/24hr patch Commonly known as:  NICODERM CQ - dosed in mg/24  hours Place 1 patch (21 mg total) onto the skin daily as needed (smoking cessation).   rosuvastatin 40 MG tablet Commonly known as:  CRESTOR Take 1 tablet (40 mg total) by mouth daily. What changed:  how much to take  when to take this      Follow-up Information    Dennie Bible, NP. Schedule an appointment as soon as possible for a visit in 6 week(s).   Specialty:  Family Medicine Contact information: [address] [place] [address] [phone]          No Known Allergies  Consultations:  Neurology  Procedures/Studies:  2D echo: Study Conclusions - Left ventricle: The cavity size was normal. Wall thickness wasincreased in a pattern of moderate LVH. Systolic function wasnormal. The estimated ejection fraction was in the range of 50%to 55%. There is hypokinesis of the mid-apicalinferiormyocardium. Features are consistent with a pseudonormal leftventricular filling pattern, with concomitant abnormal relaxationand increased filling pressure (grade 2 diastolic dysfunction). - Aortic root: The aortic root was mildly dilated. - Pulmonary arteries: Systolic pressure was mildly increased. PApeak pressure: 37 mm Hg (S).  Dg Chest 2 View  Result Date: 06/21/2016 CLINICAL DATA:  Status post CVA.  Initial encounter. EXAM: CHEST  2 VIEW COMPARISON:  Chest radiograph performed 07/23/2012, and CT of the chest performed 09/02/2012 FINDINGS: The lungs are well-aerated. Mild left basilar airspace opacity raises concern for pneumonia. There is no evidence of pleural effusion or pneumothorax. The heart is borderline normal in size. No acute osseous abnormalities are seen. IMPRESSION: Mild left basilar airspace opacity raises concern for pneumonia. Electronically Signed   By: Garald Balding M.D.   On: 06/21/2016 00:44   Ct Head Wo Contrast  Result Date: 06/20/2016 CLINICAL DATA:  Left-sided weakness since Wednesday EXAM: CT HEAD WITHOUT CONTRAST TECHNIQUE:  Contiguous axial images were obtained from the base of the skull through the vertex without intravenous contrast. COMPARISON:  09/22/2007 FINDINGS: Brain: No territorial infarct is visualized. There is moderate atrophy. Periventricular white matter hypodensity consistent with small vessel ischemic change. Old appearing infarcts in the left basal ganglia. 9 mm mildly dense focus near the head of left caudate. Not much surrounding edema or mass effect. The ventricles are slightly enlarged, likely related to atrophy. Vascular: Vertebral artery and basilar artery calcification. Carotid artery calcification. No hyperdense vessels. Skull: No fracture or suspicious bone lesion. Sinuses/Orbits: Mucosal thickening in the maxillary and ethmoid sinuses. No acute orbital abnormality. Bilateral lens extraction. Other: None IMPRESSION: 1. 9 mm hyperdense focus near the head of the left caudate ; on certain if this represents mild calcification, sequelae of prior hemorrhage, or a small hyperdense mass. No significant surrounding edema or mass effect. MRI is recommended for further evaluation. 2. Atrophy and is bilateral white matter small vessel ischemic changes with old infarcts in the left basal ganglia. Electronically Signed   By: Donavan Foil M.D.   On: 06/20/2016 18:53   Mr Angiogram Head Wo Contrast  Result Date: 06/20/2016 CLINICAL DATA:  Left-sided weakness and difficulty walking EXAM: MRI HEAD WITHOUT CONTRAST MRA HEAD WITHOUT CONTRAST TECHNIQUE: Multiplanar, multiecho pulse sequences of the brain and surrounding structures were obtained without intravenous contrast. Angiographic images of the head were  obtained using MRA technique without contrast. COMPARISON:  Head CT 06/20/2016 FINDINGS: MRI HEAD FINDINGS Brain: There is focal diffusion restriction within the tail of the right caudate, extending inferiorly near the posterior limb of the right internal capsule. No other diffusion restriction. There is beginning  confluent hyperintense T2-weighted signal within the periventricular white matter, most often seen in the setting of chronic microvascular ischemia. There is an enlarged perivascular space of the left lentiform nucleus. 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 There is a focus of chronic microhemorrhage in the left occipital lobe. No hydrocephalus or extra-axial fluid collection. The midline structures are normal. There is age advanced atrophy without lobar predilection. Skull and upper cervical spine: The visualized skull base, calvarium, upper cervical spine and extracranial soft tissues are normal. Sinuses/Orbits: No fluid levels or advanced mucosal thickening. No mastoid effusion. Normal orbits. MRA HEAD FINDINGS Intracranial internal carotid arteries: Normal. Anterior cerebral arteries: Normal. Middle cerebral arteries: Normal. Posterior communicating arteries: Absent bilaterally. Posterior cerebral arteries: Normal. Basilar artery: Normal. Vertebral arteries: Left dominant. Normal. Superior cerebellar arteries: Duplicated bilaterally. Anterior inferior cerebellar arteries: Normal. Posterior inferior cerebellar arteries: Normal. IMPRESSION: 1. Acute infarct of the right caudate tail, extending inferiorly along the posterior limb of the right internal capsule. This is in keeping with the reported left-sided weakness. 2. No acute hemorrhage or mass effect. 3. 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 4. Normal intracranial MRA. 5. Chronic microvascular ischemia and age advanced atrophy. Electronically Signed   By: Ulyses Jarred M.D.   On: 06/20/2016 22:18   Mr Brain Wo Contrast  Result Date: 06/20/2016 CLINICAL DATA:  Left-sided weakness and difficulty walking EXAM: MRI HEAD WITHOUT CONTRAST MRA HEAD WITHOUT CONTRAST TECHNIQUE: Multiplanar,  multiecho pulse sequences of the brain and surrounding structures were obtained without intravenous contrast. Angiographic images of the head were obtained using MRA technique without contrast. COMPARISON:  Head CT 06/20/2016 FINDINGS: MRI HEAD FINDINGS Brain: There is focal diffusion restriction within the tail of the right caudate, extending inferiorly near the posterior limb of the right internal capsule. No other diffusion restriction. There is beginning confluent hyperintense T2-weighted signal within the periventricular white matter, most often seen in the setting of chronic microvascular ischemia. There is an enlarged perivascular space of the left lentiform nucleus. 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 There is a focus of chronic microhemorrhage in the left occipital lobe. No hydrocephalus or extra-axial fluid collection. The midline structures are normal. There is age advanced atrophy without lobar predilection. Skull and upper cervical spine: The visualized skull base, calvarium, upper cervical spine and extracranial soft tissues are normal. Sinuses/Orbits: No fluid levels or advanced mucosal thickening. No mastoid effusion. Normal orbits. MRA HEAD FINDINGS Intracranial internal carotid arteries: Normal. Anterior cerebral arteries: Normal. Middle cerebral arteries: Normal. Posterior communicating arteries: Absent bilaterally. Posterior cerebral arteries: Normal. Basilar artery: Normal. Vertebral arteries: Left dominant. Normal. Superior cerebellar arteries: Duplicated bilaterally. Anterior inferior cerebellar arteries: Normal. Posterior inferior cerebellar arteries: Normal. IMPRESSION: 1. Acute infarct of the right caudate tail, extending inferiorly along the posterior limb of the right internal capsule. This is in keeping with the reported left-sided weakness. 2. No acute hemorrhage or mass effect. 3.  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 4. Normal intracranial MRA. 5. Chronic microvascular ischemia and age advanced atrophy. Electronically Signed   By: Ulyses Jarred M.D.   On: 06/20/2016 22:18    Subjective: - no chest pain, shortness of breath, no abdominal pain, nausea or vomiting. Still unable to walk  Discharge Exam: Vitals:   06/24/16 0148 06/24/16 0544  BP: (!) 170/68 (!) 150/73  Pulse: (!) 41 (!) 45  Resp: 20 20  Temp: 98.5 F (36.9 C) 98.3 F (36.8 C)   Vitals:   06/23/16 1734 06/23/16 2101 06/24/16 0148 06/24/16 0544  BP: 139/62 (!) 159/67 (!) 170/68 (!) 150/73  Pulse: (!) 52 (!) 45 (!) 41 (!) 45  Resp: 20 20 20 20   Temp: 97.9 F (36.6 C) 98.5 F (36.9 C) 98.5 F (36.9 C) 98.3 F (36.8 C)  TempSrc: Oral Oral Oral Oral  SpO2: 95% 94% 97% 93%  Weight:      Height:        General: Pt is alert, awake, not in acute distress Cardiovascular: RRR, S1/S2 +, no rubs, no gallops Respiratory: CTA bilaterally, no wheezing, no rhonchi Abdominal: Soft, NT, ND, bowel sounds + Extremities: no edema, no cyanosis   The results of significant diagnostics from this hospitalization (including imaging, microbiology, ancillary and laboratory) are listed below for reference.     Microbiology: No results found for this or any previous visit (from the past 240 hour(s)).   Labs: BNP (last 3 results) No results for input(s): BNP in the last 8760 hours. Basic Metabolic Panel:  Recent Labs Lab 06/20/16 1804 06/20/16 1829 06/21/16 0529 06/23/16 0548  NA 138 140 140 139  K 3.7 3.6 3.6 3.6  CL 104 104 106 107  CO2 23  --  27 26  GLUCOSE 115* 111* 114* 92  BUN 18 21* 17 15  CREATININE 1.40* 1.40* 1.29* 1.25*  CALCIUM 8.8*  --  8.4* 8.6*   Liver Function Tests:  Recent Labs Lab 06/20/16 1804 06/23/16 0548  AST 20 14*  ALT 12* 11*  ALKPHOS 79 65  BILITOT 0.9 0.6  PROT 6.6 5.8*  ALBUMIN 3.7  3.1*   No results for input(s): LIPASE, AMYLASE in the last 168 hours. No results for input(s): AMMONIA in the last 168 hours. CBC:  Recent Labs Lab 06/20/16 1804 06/20/16 1829 06/21/16 0529 06/23/16 0548  WBC 10.2  --  6.2 7.0  NEUTROABS 7.9*  --  4.1  --   HGB 15.3 15.6 13.5 13.9  HCT 45.8 46.0 40.5 41.8  MCV 89.6  --  89.8 89.1  PLT 117*  --  105* 106*   Cardiac Enzymes:  Recent Labs Lab 06/21/16 0529  TROPONINI <0.03   BNP: Invalid input(s): POCBNP CBG: No results for input(s): GLUCAP in the last 168 hours. D-Dimer No results for input(s): DDIMER in the last 72 hours. Hgb A1c No results for input(s): HGBA1C in the last 72 hours. Lipid Profile No results for input(s): CHOL, HDL, LDLCALC, TRIG, CHOLHDL, LDLDIRECT in the last 72 hours. Thyroid function studies No results for input(s): TSH, T4TOTAL, T3FREE, THYROIDAB in the last 72 hours.  Invalid input(s): FREET3 Anemia work up No results for input(s): VITAMINB12, FOLATE, FERRITIN, TIBC, IRON, RETICCTPCT in the last 72 hours. Urinalysis No results found for: COLORURINE, APPEARANCEUR, LABSPEC, [place], GLUCOSEU, HGBUR, BILIRUBINUR, KETONESUR, PROTEINUR, UROBILINOGEN, NITRITE, LEUKOCYTESUR Sepsis Labs Invalid input(s): PROCALCITONIN,  WBC,  LACTICIDVEN Microbiology No results found for this  or any previous visit (from the past 240 hour(s)).   Time coordinating discharge: 45 minutes  SIGNED:  Marzetta Board, MD  [facility] 06/24/2016, 10:06 AM Pager [phone]  If 7PM-7AM, please contact night-coverage [email or web address] Password [email or web address]

## 2016-06-24 NOTE — Progress Notes (Signed)
Donald Richardson Rehab Admission Coordinator Signed Physical Medicine and Rehabilitation  PMR Pre-admission Date of Service: 06/24/2016 12:08 PM  Related encounter: ED to Hosp-Admission (Current) from 06/20/2016 in [place]       [] Hide copied text PMR Admission Coordinator Pre-Admission Assessment  Patient: Donald Richardson is an 76 y.o., male MRN: [id number] DOB: [birth year] Height: 5\' 8"  (172.7 cm) Weight: 67.1 kg (147 lb 14.4 oz)                                                                                                                                                  Insurance Information HMO:     PPO:      PCP:      IPA:      80/20:      OTHER:  PRIMARY: Medicare A & B      Policy#: [id number]      Subscriber: Self CM Name:       Phone#:      Fax#:  Pre-Cert#: eligible via [employer]      Employer: retired Benefits:  Phone #:      Name:  Eff. Date: 12/01/16     Deduct: $1340      Out of Pocket Max: N/A      Life Max: N/A CIR: 100%      SNF: 100% days 1-20, 80% days 21-100 Outpatient: 80%     Co-Pay: 20% Home Health: 100%      Co-Pay: none DME: 80%     Co-Pay: 20% Providers: patient's choice   SECONDARY: [employer]      Policy#: [id number]      Subscriber: Self CM Name:       Phone#:      Fax#:  Pre-Cert#:       Employer:  Benefits:  Phone #: [phone]     Name:  Eff. Date:      Deduct:       Out of Pocket Max:       Life Max:  CIR:       SNF:  Outpatient:      Co-Pay:  Home Health:       Co-Pay:  DME:      Co-Pay:   Medicaid Application Date:       Case Manager:  Disability Application Date:       Case Worker:   Emergency Contact Information        Contact Information    Name Relation Home Work Mobile   [place] Brother [phone]  [phone]   Shirlee Latch [phone]       Current Medical History  Patient Admitting Diagnosis: Right caudate infact  History of Present Illness: Donald Richardson a 76 y.o.right handed malewith history of tobacco abuse/COPD, thrombocytopenia, arteriosclerotic heart disease with stenting maintained  on aspirin and Plavix,diet controlleddiabetes mellitus, hyperlipidemia and hypertension. History taken from chart review. Patient lives alone and was independent prior to admission. One level home with 2 steps to entry.Closest family is brotherin [employer] to assist if needed.Presented 06/20/2016 for left-sided weakness and fall 2 without loss of consciousness. CT of the head showed a 9 mm hyperdense focus near the head of the left caudate no significant surrounding edema or mass effect. MRI reviewed, showing right caudate infarct. Per report, acute infarct of the right caudate tail extending inferiorly along the posterior limb of the right internal capsule. MRA unremarkable. Carotid Dopplers in no ICA stenosis. Echocardiogram with ejection fraction of 50% grade 2 diastolic dysfunction. Neurology consulted presently on aspirin and Plavix for CVA prophylaxis. Subcutaneous Lovenox for DVT prophylaxis. Tolerating a regular consistency diet. Physicaland occupationaltherapy evaluation completed with recommendations of physical medicine rehabilitation consult. Patient was admitted for comprehensive rehabilitation program 06/24/16.   NIH Total: 0  Past Medical History      Past Medical History:  Diagnosis Date  . ASHD (arteriosclerotic heart disease)    STENT  . COPD (chronic obstructive pulmonary disease) ([place])   . Diabetes mellitus    Hgb A1C- 5.8 in May 2017  . Hemorrhoids   . Hyperlipidemia   . Hypertension   . Smoker     Family History  family history includes Breast cancer in his mother; Parkinson's disease in his father.  Prior Rehab/Hospitalizations:  Has the patient had major surgery during 100 days prior to admission? No  Current Medications   Current Facility-Administered Medications:  .    stroke: mapping our early stages of recovery book, , Does not apply, Once, Norval Morton, MD .  acetaminophen (TYLENOL) tablet 650 mg, 650 mg, Oral, Q4H PRN **OR** acetaminophen (TYLENOL) solution 650 mg, 650 mg, Per Tube, Q4H PRN **OR** acetaminophen (TYLENOL) suppository 650 mg, 650 mg, Rectal, Q4H PRN, Norval Morton, MD .  aspirin EC tablet 325 mg, 325 mg, Oral, Daily, Rosalin Hawking, MD, 325 mg at 06/24/16 1024 .  clopidogrel (PLAVIX) tablet 75 mg, 75 mg, Oral, Daily, Norval Morton, MD, 75 mg at 06/24/16 1024 .  enoxaparin (LOVENOX) injection 40 mg, 40 mg, Subcutaneous, Q24H, Rondell A Smith, MD, 40 mg at 06/24/16 1024 .  ipratropium-albuterol (DUONEB) 0.5-2.5 (3) MG/3ML nebulizer solution 3 mL, 3 mL, Nebulization, Q4H PRN, Norval Morton, MD .  nicotine (NICODERM CQ - dosed in mg/24 hours) patch 21 mg, 21 mg, Transdermal, Daily PRN, Norval Morton, MD .  rosuvastatin (CRESTOR) tablet 20 mg, 20 mg, Oral, QHS, Norval Morton, MD, 20 mg at 06/23/16 2218 .  senna-docusate (Senokot-S) tablet 1 tablet, 1 tablet, Oral, QHS PRN, Norval Morton, MD  Patients Current Diet: Diet heart healthy/carb modified Room service appropriate? Yes; Fluid consistency: Thin  Precautions / Restrictions Precautions Precautions: Fall Restrictions Weight Bearing Restrictions: No   Has the patient had 2 or more falls or a fall with injury in the past year?Yes, 2 falls with abrasions and skin team to arms that lead to this admission and his stroke diagnosis.  Prior Activity Level Community (5-7x/wk): Prior to admission patient lived alone at home and was fully independent.  He is retired and enjoys working on and flying his own plane.    Home Assistive Devices / Equipment Home Assistive Devices/Equipment: None Home Equipment: None  Prior Device Use: Indicate devices/aids used by the patient prior to current illness, exacerbation or injury? None of the  above  Prior Functional Level Prior  Function Level of Independence: Independent Comments: pt drives  Self Care: Did the patient need help bathing, dressing, using the toilet or eating?  Independent  Indoor Mobility: Did the patient need assistance with walking from room to room (with or without device)? Independent  Stairs: Did the patient need assistance with internal or external stairs (with or without device)? Independent  Functional Cognition: Did the patient need help planning regular tasks such as shopping or remembering to take medications? Independent  Current Functional Level Cognition  Overall Cognitive Status: No family/caregiver present to determine baseline cognitive functioning Orientation Level: Oriented X4 General Comments: Pt reporting decreased awareness that he had urinated this am requiring assistance for bath from nursing staff. Pt with decreasede overall safety awareness and moving quickly. On entry into room pt shoveling food into mouth quickly.    Extremity Assessment (includes Sensation/Coordination)  Upper Extremity Assessment: Defer to OT evaluation LUE Deficits / Details: states that it "feels funny" to move; sensation typical per Pt report; forward flexion to 120 before compensation from other muscles, struggled to get arm behind him to reach lower back LUE Sensation: decreased proprioception LUE Coordination: decreased gross motor, decreased fine motor  Lower Extremity Assessment: LLE deficits/detail LLE Deficits / Details: hip flex 4/5, knee ext 4/5, knee flex 4/5, ankle df 4/5, LLE ataxia noted with ambulation LLE Sensation: decreased light touch, decreased proprioception LLE Coordination: decreased fine motor, decreased gross motor    ADLs  Overall ADL's : Needs assistance/impaired Eating/Feeding: Set up, Sitting Eating/Feeding Details (indicate cue type and reason): Assist to open container of peaches. Grooming: Wash/dry hands, Wash/dry face, Oral care, Minimal assistance,  Standing Grooming Details (indicate cue type and reason): Assistance for balance and VC's for awareness and to prevent lateral leaning. Upper Body Bathing: Min guard, Sitting Lower Body Bathing: Minimal assistance, Sitting/lateral leans Upper Body Dressing : Sitting, Min guard Lower Body Dressing: Minimal assistance, Sitting/lateral leans Toilet Transfer: Minimal assistance, Ambulation, RW Toilet Transfer Details (indicate cue type and reason): Vc for safety and sequencing with RW as well as for upright posturing. Toileting- Clothing Manipulation and Hygiene: Moderate assistance, Sit to/from stand Toileting - Clothing Manipulation Details (indicate cue type and reason): Mod assist for balance during pericare and as pt fatigued, required assistance to thoroughness. Tub/ Shower Transfer: Tub transfer, Moderate assistance, Ambulation, Rolling walker, 3 in 1 Functional mobility during ADLs: Minimal assistance, Rolling walker, Cueing for safety, Cueing for sequencing General ADL Comments: Pt demonstrating emergent awareness during session. Noted decreased L hand fine motor coordination when attempting to pick up small hearing aid battery.    Mobility  Overal bed mobility: Needs Assistance Bed Mobility: Sit to Supine Sit to supine: Supervision General bed mobility comments: OOB in chair on arrival.    Transfers  Overall transfer level: Needs assistance Equipment used: Rolling walker (2 wheeled) Transfers: Sit to/from Stand Sit to Stand: Min assist General transfer comment: Min assist for steadying and VC's for safe hand placement with RW.    Ambulation / Gait / Stairs / Wheelchair Mobility  Ambulation/Gait Ambulation/Gait assistance: Min assist, Mod assist Ambulation Distance (Feet): 70 Feet Assistive device: Rolling walker (2 wheeled) (30 ft with RW, 70 ft with HHA -moderate assist) Gait Pattern/deviations: Decreased weight shift to left, Decreased step length - left, Decreased  dorsiflexion - left, Staggering right General Gait Details: patient able to ambulate with min assist and RW, max multi modal cues for positioning and posture, LLE drag and difficulty with  placement. Attempted HHA with higher level tasks patient requiring moderate assist to maintain upright positioning.  Gait velocity: decreased Gait velocity interpretation: <1.8 ft/sec, indicative of risk for recurrent falls    Posture / Balance Dynamic Sitting Balance Sitting balance - Comments: Prefers single  Balance Overall balance assessment: Needs assistance Sitting-balance support: Single extremity supported, No upper extremity supported, Feet supported Sitting balance-Leahy Scale: Fair Sitting balance - Comments: Prefers single  Postural control: Left lateral lean Standing balance support: Bilateral upper extremity supported, During functional activity, No upper extremity supported Standing balance-Leahy Scale: Poor Standing balance comment: L lateral lean and requires UE support to maintain balance in standing position. High level balance activites: Backward walking, Direction changes, Turns, Head turns High Level Balance Comments: Moderate to max assist for higher level balance tasks, patient unable to coordinate stable gait during these tasks, significant difficulty positioning LLE and maintaining upright posture throughout. Multiple LOB requiring max assist to prevent fall     Special needs/care consideration BiPAP/CPAP: No CPM: No Continuous Drip IV: No Dialysis: No         Life Vest: No Oxygen: No Special Bed: No Trach Size: No Wound Vac (area): No       Skin: Dry, skin tear to left arm, bruising to bilateral upper extremities                               Bowel mgmt: Continent 06/23/16 Bladder mgmt: Continent with urinal  Diabetic mgmt: Yes, reported that he was supposed to be checking his CBG and controlled it with diet; A1C 5.8-6.2 per patient;s report of his labs prior to  admission     Previous Home Environment Living Arrangements: Alone Available Help at Discharge: Friend(s), Available PRN/intermittently Type of Home: House Home Layout: One level Home Access: Stairs to enter Entrance Stairs-Rails: None Entrance Stairs-Number of Steps: 2 Bathroom Shower/Tub: Optometrist: Yes How Accessible: Accessible via walker Home Care Services: No Additional Comments: loves airplanes  Discharge Living Setting Plans for Discharge Living Setting: Patient's home, Alone Type of Home at Discharge: House Discharge Home Layout: Two level, Able to live on main level with bedroom/bathroom Discharge Home Access: Stairs to enter Entrance Stairs-Rails: None Entrance Stairs-Number of Steps: 2 Discharge Bathroom Shower/Tub: Tub/shower unit, Curtain Discharge Bathroom Toilet: Standard Discharge Bathroom Accessibility: Yes How Accessible: Accessible via walker Does the patient have any problems obtaining your medications?: No  Social/Family/Support Systems Patient Roles: Other (Comment) (brother and friend to neighbors) Sport and exercise psychologist Information: Brother: Millie Shorb [phone] Anticipated Caregiver: Family and friends are available intermittently  Anticipated Caregiver's Contact Information: see baove Ability/Limitations of Caregiver: Brother lives in [place] Caregiver Availability: Intermittent Discharge Plan Discussed with Primary Caregiver: Yes Is Caregiver In Agreement with Plan?: Yes Does Caregiver/Family have Issues with Lodging/Transportation while Pt is in Rehab?: No  Goals/Additional Needs Patient/Family Goal for Rehab: PT/OT Mod I  Expected length of stay: 16-20 days  Cultural Considerations: None Dietary Needs: Carb. Mod. and Heart Healty Restrictions  Equipment Needs: TBD Special Service Needs: None Additional Information: Has supportive brother and neighbors  Pt/Family Agrees to Admission  and willing to participate: Yes Program Orientation Provided & Reviewed with Pt/Caregiver Including Roles  & Responsibilities: Yes Additional Information Needs: Patient smoked a pack a day PTA and as of right now is declining a patch  Information Needs to be Provided By: Team monitor for cravings and need for a nicotine patch  Decrease burden of Care through IP rehab admission: No  Possible need for SNF placement upon discharge: No  Patient Condition: This patient's medical and functional status has changed since the consult dated 06/23/16 in which the Rehabilitation Physician determined and documented that the patient was potentially appropriate for intensive rehabilitative care in an inpatient rehabilitation facility. Issues have been addressed and update has been discussed with Dr. Naaman Plummer and patient now appropriate for inpatient rehabilitation. Will admit to inpatient rehab today.   Preadmission Screen Completed By:  Donald Richardson, 06/24/2016 12:08 PM ______________________________________________________________________   Discussed status with Dr. Naaman Plummer on 04/26/16 at 1215 and received telephone approval for admission today.  Admission Coordinator:  Donald Richardson, time 1215/Date 06/24/16       Cosigned by: Meredith Staggers, MD at 06/24/2016 1:35 PM  Revision History

## 2016-06-24 NOTE — H&P (Signed)
  Physical Medicine and Rehabilitation Admission H&P    Chief Complaint  Patient presents with  . Weakness  : HPI: Donald Richardson is a 76 y.o. right handed male with history of tobacco abuse/COPD, thrombocytopenia, arteriosclerotic heart disease with stenting maintained on aspirin and Plavix, diet controlled diabetes mellitus, hyperlipidemia and hypertension. History taken from chart review. Patient lives alone and was independent prior to admission. One level home with 2 steps to entry. Closest family is  brother in [place] who plans to assist if needed . Presented 06/20/2016 for left-sided weakness and fall 2 without loss of consciousness. CT of the head showed a 9 mm hyperdense focus near the head of the right caudate no significant surrounding edema or mass effect. MRI reviewed, showing right caudate infarct. Per report, acute infarct of the right caudate tail extending inferiorly along the posterior limb of the right internal capsule. MRA unremarkable. Carotid Dopplers in no ICA stenosis. Echocardiogram with ejection fraction of 55% grade 2 diastolic dysfunction. Neurology consulted presently on aspirin and Plavix for CVA prophylaxis. Subcutaneous Lovenox for DVT prophylaxis. Tolerating a regular consistency diet. Physical and occupational therapy evaluation completed with recommendations of physical medicine rehabilitation consult. Patient was admitted for comprehensive rehabilitation program  Review of Systems  Constitutional: Negative for chills and fever.  HENT: Negative for hearing loss.   Eyes: Negative for blurred vision and double vision.  Respiratory: Positive for cough and shortness of breath.   Cardiovascular: Negative for chest pain, palpitations and leg swelling.  Gastrointestinal: Positive for constipation. Negative for nausea and vomiting.  Genitourinary: Positive for frequency and urgency. Negative for dysuria, flank pain and hematuria.  Musculoskeletal:  Positive for joint pain and myalgias.  Skin: Negative for rash.  Neurological: Positive for sensory change and focal weakness. Negative for seizures.  All other systems reviewed and are negative.  Past Medical History:  Diagnosis Date  . ASHD (arteriosclerotic heart disease)    STENT  . COPD (chronic obstructive pulmonary disease) (HCC)   . Diabetes mellitus    Hgb A1C- 5.8 in May 2017  . Hemorrhoids   . Hyperlipidemia   . Hypertension   . Smoker    Past Surgical History:  Procedure Laterality Date  . INGUINAL HERNIA REPAIR Left 10/26/2015   Procedure: LEFT INGUINAL HERNIA REPAIR WITH MESH;  Surgeon: Todd Rosenbower, MD;  Location: [facility];  Service: General;  Laterality: Left;  . INSERTION OF MESH Left 10/26/2015   Procedure: INSERTION OF MESH;  Surgeon: Todd Rosenbower, MD;  Location: [facility];  Service: General;  Laterality: Left;  . NO PAST SURGERIES     Family History  Problem Relation Age of Onset  . Breast cancer Mother   . Parkinson's disease Father    Social History:  reports that he has been smoking Cigarettes.  He has been smoking about 1.00 pack per day. He has never used smokeless tobacco. He reports that he does not drink alcohol or use drugs. Allergies: No Known Allergies Medications Prior to Admission  Medication Sig Dispense Refill  . aspirin EC 81 MG tablet Take 81 mg by mouth daily.    . clopidogrel (PLAVIX) 75 MG tablet Take 75 mg by mouth daily.     . lisinopril (PRINIVIL,ZESTRIL) 10 MG tablet Take 1 tablet (10 mg total) by mouth daily. 90 tablet 3  . rosuvastatin (CRESTOR) 40 MG tablet Take 1 tablet (40 mg total) by mouth daily. (Patient taking differently: Take 20 mg by mouth at bedtime. )   90 tablet 3    Home: Home Living Family/patient expects to be discharged to:: Private residence Living Arrangements: Alone Available Help at Discharge: Friend(s), Available PRN/intermittently Type of Home: House Home Access: Stairs to enter Entrance Stairs-Number of  Steps: 2 Entrance Stairs-Rails: None Home Layout: One level Bathroom Shower/Tub: Tub/shower unit Bathroom Toilet: Standard Bathroom Accessibility: Yes Home Equipment: None Additional Comments: loves airplanes   Functional History: Prior Function Level of Independence: Independent Comments: pt drives  Functional Status:  Mobility: Bed Mobility Overal bed mobility: Needs Assistance Bed Mobility: Sit to Supine Sit to supine: Supervision General bed mobility comments: OOB in chair on arrival. Transfers Overall transfer level: Needs assistance Equipment used: Rolling walker (2 wheeled) Transfers: Sit to/from Stand Sit to Stand: Min assist General transfer comment: Min assist for steadying and VC's for safe hand placement with RW. Ambulation/Gait Ambulation/Gait assistance: Min assist, Mod assist Ambulation Distance (Feet): 70 Feet Assistive device: Rolling walker (2 wheeled) (30 ft with RW, 70 ft with HHA -moderate assist) Gait Pattern/deviations: Decreased weight shift to left, Decreased step length - left, Decreased dorsiflexion - left, Staggering right General Gait Details: patient able to ambulate with min assist and RW, max multi modal cues for positioning and posture, LLE drag and difficulty with placement. Attempted HHA with higher level tasks patient requiring moderate assist to maintain upright positioning.  Gait velocity: decreased Gait velocity interpretation: <1.8 ft/sec, indicative of risk for recurrent falls    ADL: ADL Overall ADL's : Needs assistance/impaired Eating/Feeding: Set up, Sitting Eating/Feeding Details (indicate cue type and reason): Assist to open container of peaches. Grooming: Wash/dry hands, Wash/dry face, Oral care, Minimal assistance, Standing Grooming Details (indicate cue type and reason): Assistance for balance and VC's for awareness and to prevent lateral leaning. Upper Body Bathing: Min guard, Sitting Lower Body Bathing: Minimal  assistance, Sitting/lateral leans Upper Body Dressing : Sitting, Min guard Lower Body Dressing: Minimal assistance, Sitting/lateral leans Toilet Transfer: Minimal assistance, Ambulation, RW Toilet Transfer Details (indicate cue type and reason): Vc for safety and sequencing with RW as well as for upright posturing. Toileting- Clothing Manipulation and Hygiene: Moderate assistance, Sit to/from stand Toileting - Clothing Manipulation Details (indicate cue type and reason): Mod assist for balance during pericare and as pt fatigued, required assistance to thoroughness. Tub/ Shower Transfer: Tub transfer, Moderate assistance, Ambulation, Rolling walker, 3 in 1 Functional mobility during ADLs: Minimal assistance, Rolling walker, Cueing for safety, Cueing for sequencing General ADL Comments: Pt demonstrating emergent awareness during session. Noted decreased L hand fine motor coordination when attempting to pick up small hearing aid battery.  Cognition: Cognition Overall Cognitive Status: No family/caregiver present to determine baseline cognitive functioning Orientation Level: Oriented X4 Cognition Arousal/Alertness: Awake/alert Behavior During Therapy: Impulsive Overall Cognitive Status: No family/caregiver present to determine baseline cognitive functioning General Comments: Pt reporting decreased awareness that he had urinated this am requiring assistance for bath from nursing staff. Pt with decreasede overall safety awareness and moving quickly. On entry into room pt shoveling food into mouth quickly.  Physical Exam: Blood pressure (!) 150/73, pulse (!) 45, temperature 98.3 F (36.8 C), temperature source Oral, resp. rate 20, height 5' 8" (1.727 m), weight 67.1 kg (147 lb 14.4 oz), SpO2 93 %. Physical Exam  Vitals reviewed. Constitutional: He appears well-developed.  HENT:  Head: Normocephalic.  Eyes: EOM are normal. Left eye exhibits no discharge.  Neck: Normal range of motion. Neck  supple. No thyromegaly present.  Cardiovascular: Exam reveals no friction rub.   No murmur   heard. Bradycardia 40's to 50's.   Respiratory: Effort normal and breath sounds normal. No respiratory distress. He has no wheezes. He has no rales.  GI: Soft. He exhibits no distension. There is no tenderness. There is no rebound.  Genitourinary:  Genitourinary Comments: Urine with foul odor on underwear  Skin. Warm and dry. Some bruising and abrasions along left forearm and upper arm.  Neurological: He is alert and oriented to person, place, and time Fair awareness and insight. Mild left facial weakness.  Motor: RUE/RLE: 4+/5 proximal to distal LUE/LLE: 4/5 biceps, triceps, shoulders, wrist, hand, HF, KE, ADF/PF. + left Pronator drift and decreased FMC LUE with FTN. Has persistent pill rolling tremor RUE with +/- cogwheeling rigidity with ROM.  DTRs symmetric  Psych: pleasant and generally cooperative.    Results for orders placed or performed during the hospital encounter of 06/20/16 (from the past 48 hour(s))  Comprehensive metabolic panel     Status: Abnormal   Collection Time: 06/23/16  5:48 AM  Result Value Ref Range   Sodium 139 135 - 145 mmol/L   Potassium 3.6 3.5 - 5.1 mmol/L   Chloride 107 101 - 111 mmol/L   CO2 26 22 - 32 mmol/L   Glucose, Bld 92 65 - 99 mg/dL   BUN 15 6 - 20 mg/dL   Creatinine, Ser 1.25 (H) 0.61 - 1.24 mg/dL   Calcium 8.6 (L) 8.9 - 10.3 mg/dL   Total Protein 5.8 (L) 6.5 - 8.1 g/dL   Albumin 3.1 (L) 3.5 - 5.0 g/dL   AST 14 (L) 15 - 41 U/L   ALT 11 (L) 17 - 63 U/L   Alkaline Phosphatase 65 38 - 126 U/L   Total Bilirubin 0.6 0.3 - 1.2 mg/dL   GFR calc non Af Amer 55 (L) >60 mL/min   GFR calc Af Amer >60 >60 mL/min    Comment: (NOTE) The eGFR has been calculated using the CKD EPI equation. This calculation has not been validated in all clinical situations. eGFR's persistently <60 mL/min signify possible Chronic Kidney Disease.    Anion gap 6 5 - 15  CBC      Status: Abnormal   Collection Time: 06/23/16  5:48 AM  Result Value Ref Range   WBC 7.0 4.0 - 10.5 K/uL   RBC 4.69 4.22 - 5.81 MIL/uL   Hemoglobin 13.9 13.0 - 17.0 g/dL   HCT 41.8 39.0 - 52.0 %   MCV 89.1 78.0 - 100.0 fL   MCH 29.6 26.0 - 34.0 pg   MCHC 33.3 30.0 - 36.0 g/dL   RDW 14.2 11.5 - 15.5 %   Platelets 106 (L) 150 - 400 K/uL    Comment: CONSISTENT WITH PREVIOUS RESULT   No results found.     Medical Problem List and Plan: 1.  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 2.  DVT Prophylaxis/Anticoagulation: Subcutaneous Lovenox. Monitor platelet counts of any signs of bleeding 3. Pain Management: Tylenol as needed 4. Mood: Provide emotional support 5. Neuropsych: This patient is capable of making decisions on his own behalf. 6. Skin/Wound Care: Routine skin checks 7. Fluids/Electrolytes/Nutrition: Routine I&O with follow-up chemistries 8. Arteriosclerotic heart disease with stenting. Continue aspirin and Plavix 9. Diet-controlled diabetes mellitus. Hemoglobin A1c 5.8. CBG checks discontinued 10. History of hypertension. Permissive hypertension. Resume lisinopril 10 mg daily as needed 11. Tobacco abuse. Nicoderm patch. Provide counseling 12. Hyperlipidemia. Crestor 13. History   of Thrombocytopenia. 117,000. Follow-up CBC 14. Urinary frequency, malodorous urine: check ua and ucx  Post Admission Physician Evaluation: 1. Functional deficits secondary  to right caudate infarct. 2. Patient is admitted to receive collaborative, interdisciplinary care between the physiatrist, rehab nursing staff, and therapy team. 3. Patient's level of medical complexity and substantial therapy needs in context of that medical necessity cannot be provided at a lesser intensity of care such as a SNF. 4. Patient has experienced substantial functional loss from his/her baseline which was  documented above under the "Functional History" and "Functional Status" headings.  Judging by the patient's diagnosis, physical exam, and functional history, the patient has potential for functional progress which will result in measurable gains while on inpatient rehab.  These gains will be of substantial and practical use upon discharge  in facilitating mobility and self-care at the household level. 5. Physiatrist will provide 24 hour management of medical needs as well as oversight of the therapy plan/treatment and provide guidance as appropriate regarding the interaction of the two. 6. The Preadmission Screening has been reviewed and patient status is unchanged unless otherwise stated above. 7. 24 hour rehab nursing will assist with bladder management, bowel management, safety, skin/wound care, disease management, medication administration, pain management and patient education  and help integrate therapy concepts, techniques,education, etc. 8. PT will assess and treat for/with: Lower extremity strength, range of motion, stamina, balance, functional mobility, safety, adaptive techniques and equipment, NMR, orthotics, community reintegration.   Goals are: mod I. 9. OT will assess and treat for/with: ADL's, functional mobility, safety, upper extremity strength, adaptive techniques and equipment, NMR, family/pt education, ego support, community reintegration.   Goals are: mod I. Therapy may proceed with showering this patient. 10. SLP will assess and treat for/with: n/a.  Goals are: n/a. 11. Case Management and Social Worker will assess and treat for psychological issues and discharge planning. 12. Team conference will be held weekly to assess progress toward goals and to determine barriers to discharge. 13. Patient will receive at least 3 hours of therapy per day at least 5 days per week. 14. ELOS: 16-20 days       15. Prognosis:  excellent      T. , MD, FAAPMR [facility] 06/24/2016  ANGIULLI,DANIEL J., PA-C 06/24/2016 

## 2016-06-24 NOTE — Progress Notes (Signed)
Inpatient Rehabilitation  I have received acute and rehab MD clearance and have a bed to offer patient today.  Will proceed with admitting patient to IP Rehab today and have updated team.  Please call with questions.   Carmelia Roller., CCC/SLP Admission Coordinator  [facility]  Cell [phone]

## 2016-06-24 NOTE — PMR Pre-admission (Signed)
PMR Admission Coordinator Pre-Admission Assessment  Patient: Donald Richardson is an 76 y.o., male MRN: [id number] DOB: 1940/04/23 Height: 5\' 8"  (172.7 cm) Weight: 67.1 kg (147 lb 14.4 oz)              Insurance Information HMO:     PPO:      PCP:      IPA:      80/20:      OTHER:  PRIMARY: Medicare A & B      Policy#: [id number]      Subscriber: Self CM Name:       Phone#:      Fax#:  Pre-Cert#: eligible via [employer]      Employer: retired Benefits:  Phone #:      Name:  Eff. Date: 12/01/16     Deduct: $1340      Out of Pocket Max: N/A      Life Max: N/A CIR: 100%      SNF: 100% days 1-20, 80% days 21-100 Outpatient: 80%     Co-Pay: 20% Home Health: 100%      Co-Pay: none DME: 80%     Co-Pay: 20% Providers: patient's choice   SECONDARY: [employer]      Policy#: [id number]      Subscriber: Self CM Name:       Phone#:      Fax#:  Pre-Cert#:       Employer:  Benefits:  Phone #: [phone]     Name:  Eff. Date:      Deduct:       Out of Pocket Max:       Life Max:  CIR:       SNF:  Outpatient:      Co-Pay:  Home Health:       Co-Pay:  DME:      Co-Pay:   Medicaid Application Date:       Case Manager:  Disability Application Date:       Case Worker:   Emergency Contact Information Contact Information    Name Relation Home Work Mobile   [place] Brother [phone]  [phone]   Shirlee Latch [phone]       Current Medical History  Patient Admitting Diagnosis: Right caudate infact  History of Present Illness: Donald Richardson a 76 y.o.right handed malewith history of tobacco abuse/COPD, thrombocytopenia, arteriosclerotic heart disease with stenting maintained on aspirin and Plavix, diet controlled diabetes mellitus, hyperlipidemia and hypertension. History taken from chart review. Patient lives alone and was independent prior to admission. One level home with 2 steps to entry.Closest family is  brother in [place] who plans to assist if  needed.Presented 06/20/2016 for left-sided weakness and fall 2 without loss of consciousness. CT of the head showed a 9 mm hyperdense focus near the head of the left caudate no significant surrounding edema or mass effect. MRI reviewed, showing right caudate infarct. Per report, acute infarct of the right caudate tail extending inferiorly along the posterior limb of the right internal capsule. MRA unremarkable. Carotid Dopplers in no ICA stenosis. Echocardiogram with ejection fraction of 67% grade 2 diastolic dysfunction. Neurology consulted presently on aspirin and Plavix for CVA prophylaxis. Subcutaneous Lovenox for DVT prophylaxis. Tolerating a regular consistency diet. Physical and occupational therapy evaluation completed with recommendations of physical medicine rehabilitation consult. Patient was admitted for comprehensive rehabilitation program 06/24/16.   NIH Total: 0    Past Medical History  Past Medical History:  Diagnosis Date  .  ASHD (arteriosclerotic heart disease)    STENT  . COPD (chronic obstructive pulmonary disease) ([place])   . Diabetes mellitus    Hgb A1C- 5.8 in May 2017  . Hemorrhoids   . Hyperlipidemia   . Hypertension   . Smoker     Family History  family history includes Breast cancer in his mother; Parkinson's disease in his father.  Prior Rehab/Hospitalizations:  Has the patient had major surgery during 100 days prior to admission? No  Current Medications   Current Facility-Administered Medications:  .   stroke: mapping our early stages of recovery book, , Does not apply, Once, Norval Morton, MD .  acetaminophen (TYLENOL) tablet 650 mg, 650 mg, Oral, Q4H PRN **OR** acetaminophen (TYLENOL) solution 650 mg, 650 mg, Per Tube, Q4H PRN **OR** acetaminophen (TYLENOL) suppository 650 mg, 650 mg, Rectal, Q4H PRN, Norval Morton, MD .  aspirin EC tablet 325 mg, 325 mg, Oral, Daily, Rosalin Hawking, MD, 325 mg at 06/24/16 1024 .  clopidogrel (PLAVIX) tablet 75 mg, 75 mg,  Oral, Daily, Norval Morton, MD, 75 mg at 06/24/16 1024 .  enoxaparin (LOVENOX) injection 40 mg, 40 mg, Subcutaneous, Q24H, Rondell A Smith, MD, 40 mg at 06/24/16 1024 .  ipratropium-albuterol (DUONEB) 0.5-2.5 (3) MG/3ML nebulizer solution 3 mL, 3 mL, Nebulization, Q4H PRN, Norval Morton, MD .  nicotine (NICODERM CQ - dosed in mg/24 hours) patch 21 mg, 21 mg, Transdermal, Daily PRN, Norval Morton, MD .  rosuvastatin (CRESTOR) tablet 20 mg, 20 mg, Oral, QHS, Norval Morton, MD, 20 mg at 06/23/16 2218 .  senna-docusate (Senokot-S) tablet 1 tablet, 1 tablet, Oral, QHS PRN, Norval Morton, MD  Patients Current Diet: Diet heart healthy/carb modified Room service appropriate? Yes; Fluid consistency: Thin  Precautions / Restrictions Precautions Precautions: Fall Restrictions Weight Bearing Restrictions: No   Has the patient had 2 or more falls or a fall with injury in the past year?Yes, 2 falls with abrasions and skin team to arms that lead to this admission and his stroke diagnosis.  Prior Activity Level Community (5-7x/wk): Prior to admission patient lived alone at home and was fully independent.  He is retired and enjoys working on and flying his own plane.    Home Assistive Devices / Equipment Home Assistive Devices/Equipment: None Home Equipment: None  Prior Device Use: Indicate devices/aids used by the patient prior to current illness, exacerbation or injury? None of the above  Prior Functional Level Prior Function Level of Independence: Independent Comments: pt drives  Self Care: Did the patient need help bathing, dressing, using the toilet or eating?  Independent  Indoor Mobility: Did the patient need assistance with walking from room to room (with or without device)? Independent  Stairs: Did the patient need assistance with internal or external stairs (with or without device)? Independent  Functional Cognition: Did the patient need help planning regular tasks such as  shopping or remembering to take medications? Independent  Current Functional Level Cognition  Overall Cognitive Status: No family/caregiver present to determine baseline cognitive functioning Orientation Level: Oriented X4 General Comments: Pt reporting decreased awareness that he had urinated this am requiring assistance for bath from nursing staff. Pt with decreasede overall safety awareness and moving quickly. On entry into room pt shoveling food into mouth quickly.    Extremity Assessment (includes Sensation/Coordination)  Upper Extremity Assessment: Defer to OT evaluation LUE Deficits / Details: states that it "feels funny" to move; sensation typical per Pt report; forward flexion to 120  before compensation from other muscles, struggled to get arm behind him to reach lower back LUE Sensation: decreased proprioception LUE Coordination: decreased gross motor, decreased fine motor  Lower Extremity Assessment: LLE deficits/detail LLE Deficits / Details: hip flex 4/5, knee ext 4/5, knee flex 4/5, ankle df 4/5, LLE ataxia noted with ambulation LLE Sensation: decreased light touch, decreased proprioception LLE Coordination: decreased fine motor, decreased gross motor    ADLs  Overall ADL's : Needs assistance/impaired Eating/Feeding: Set up, Sitting Eating/Feeding Details (indicate cue type and reason): Assist to open container of peaches. Grooming: Wash/dry hands, Wash/dry face, Oral care, Minimal assistance, Standing Grooming Details (indicate cue type and reason): Assistance for balance and VC's for awareness and to prevent lateral leaning. Upper Body Bathing: Min guard, Sitting Lower Body Bathing: Minimal assistance, Sitting/lateral leans Upper Body Dressing : Sitting, Min guard Lower Body Dressing: Minimal assistance, Sitting/lateral leans Toilet Transfer: Minimal assistance, Ambulation, RW Toilet Transfer Details (indicate cue type and reason): Vc for safety and sequencing with RW as  well as for upright posturing. Toileting- Clothing Manipulation and Hygiene: Moderate assistance, Sit to/from stand Toileting - Clothing Manipulation Details (indicate cue type and reason): Mod assist for balance during pericare and as pt fatigued, required assistance to thoroughness. Tub/ Shower Transfer: Tub transfer, Moderate assistance, Ambulation, Rolling walker, 3 in 1 Functional mobility during ADLs: Minimal assistance, Rolling walker, Cueing for safety, Cueing for sequencing General ADL Comments: Pt demonstrating emergent awareness during session. Noted decreased L hand fine motor coordination when attempting to pick up small hearing aid battery.    Mobility  Overal bed mobility: Needs Assistance Bed Mobility: Sit to Supine Sit to supine: Supervision General bed mobility comments: OOB in chair on arrival.    Transfers  Overall transfer level: Needs assistance Equipment used: Rolling walker (2 wheeled) Transfers: Sit to/from Stand Sit to Stand: Min assist General transfer comment: Min assist for steadying and VC's for safe hand placement with RW.    Ambulation / Gait / Stairs / Wheelchair Mobility  Ambulation/Gait Ambulation/Gait assistance: Min assist, Mod assist Ambulation Distance (Feet): 70 Feet Assistive device: Rolling walker (2 wheeled) (30 ft with RW, 70 ft with HHA -moderate assist) Gait Pattern/deviations: Decreased weight shift to left, Decreased step length - left, Decreased dorsiflexion - left, Staggering right General Gait Details: patient able to ambulate with min assist and RW, max multi modal cues for positioning and posture, LLE drag and difficulty with placement. Attempted HHA with higher level tasks patient requiring moderate assist to maintain upright positioning.  Gait velocity: decreased Gait velocity interpretation: <1.8 ft/sec, indicative of risk for recurrent falls    Posture / Balance Dynamic Sitting Balance Sitting balance - Comments: Prefers single   Balance Overall balance assessment: Needs assistance Sitting-balance support: Single extremity supported, No upper extremity supported, Feet supported Sitting balance-Leahy Scale: Fair Sitting balance - Comments: Prefers single  Postural control: Left lateral lean Standing balance support: Bilateral upper extremity supported, During functional activity, No upper extremity supported Standing balance-Leahy Scale: Poor Standing balance comment: L lateral lean and requires UE support to maintain balance in standing position. High level balance activites: Backward walking, Direction changes, Turns, Head turns High Level Balance Comments: Moderate to max assist for higher level balance tasks, patient unable to coordinate stable gait during these tasks, significant difficulty positioning LLE and maintaining upright posture throughout. Multiple LOB requiring max assist to prevent fall     Special needs/care consideration BiPAP/CPAP: No CPM: No Continuous Drip IV: No Dialysis: No  Life Vest: No Oxygen: No Special Bed: No Trach Size: No Wound Vac (area): No       Skin: Dry, skin tear to left arm, bruising to bilateral upper extremities                               Bowel mgmt: Continent 06/23/16 Bladder mgmt: Continent with urinal  Diabetic mgmt: Yes, reported that he was supposed to be checking his CBG and controlled it with diet; A1C 5.8-6.2 per patient;s report of his labs prior to admission     Previous Home Environment Living Arrangements: Alone Available Help at Discharge: Friend(s), Available PRN/intermittently Type of Home: House Home Layout: One level Home Access: Stairs to enter Entrance Stairs-Rails: None Entrance Stairs-Number of Steps: 2 Bathroom Shower/Tub: Optometrist: Yes How Accessible: Accessible via walker Home Care Services: No Additional Comments: loves airplanes  Discharge Living Setting Plans for  Discharge Living Setting: Patient's home, Alone Type of Home at Discharge: House Discharge Home Layout: Two level, Able to live on main level with bedroom/bathroom Discharge Home Access: Stairs to enter Entrance Stairs-Rails: None Entrance Stairs-Number of Steps: 2 Discharge Bathroom Shower/Tub: Tub/shower unit, Curtain Discharge Bathroom Toilet: Standard Discharge Bathroom Accessibility: Yes How Accessible: Accessible via walker Does the patient have any problems obtaining your medications?: No  Social/Family/Support Systems Patient Roles: Other (Comment) (brother and friend to neighbors) Sport and exercise psychologist Information: Brother: Hasan Douse [phone] Anticipated Caregiver: Family and friends are available intermittently  Anticipated Caregiver's Contact Information: see baove Ability/Limitations of Caregiver: Brother lives in [place] Caregiver Availability: Intermittent Discharge Plan Discussed with Primary Caregiver: Yes Is Caregiver In Agreement with Plan?: Yes Does Caregiver/Family have Issues with Lodging/Transportation while Pt is in Rehab?: No  Goals/Additional Needs Patient/Family Goal for Rehab: PT/OT Mod I  Expected length of stay: 16-20 days  Cultural Considerations: None Dietary Needs: Carb. Mod. and Heart Healty Restrictions  Equipment Needs: TBD Special Service Needs: None Additional Information: Has supportive brother and neighbors  Pt/Family Agrees to Admission and willing to participate: Yes Program Orientation Provided & Reviewed with Pt/Caregiver Including Roles  & Responsibilities: Yes Additional Information Needs: Patient smoked a pack a day PTA and as of right now is declining a patch  Information Needs to be Provided By: Team monitor for cravings and need for a nicotine patch  Decrease burden of Care through IP rehab admission: No  Possible need for SNF placement upon discharge: No  Patient Condition: This patient's medical and functional status has changed  since the consult dated 06/23/16 in which the Rehabilitation Physician determined and documented that the patient was potentially appropriate for intensive rehabilitative care in an inpatient rehabilitation facility. Issues have been addressed and update has been discussed with Dr. Naaman Plummer and patient now appropriate for inpatient rehabilitation. Will admit to inpatient rehab today.   Preadmission Screen Completed By:  Gunnar Fusi, 06/24/2016 12:08 PM ______________________________________________________________________   Discussed status with Dr. Naaman Plummer on 04/26/16 at 1215 and received telephone approval for admission today.  Admission Coordinator:  Gunnar Fusi, time 1215/Date 06/24/16

## 2016-06-24 NOTE — Consult Note (Signed)
           [facility] Primary Care Navigator  06/24/2016  Donald Richardson 10/17/1940 [id number]   Patient was seen at the bedside to identify possible discharge needs. Patient reports having left-sided weakness, falls and inability to walk which led him to this admission. Patient endorses Dr. Jill Alexanders with [place] as hisprimary care provider.   Patient reports using [place] at [facility] obtain medications without difficulty.  Heverbalized managing his ownmedications at home straight out of the containers.   Patient states living alone, independent with self care and able to drive prior to admission and hopes to do the same after discharge. He mentioned having neighbors that he can depend on if badly needed, toprovide transportation to his doctors' appointments.      He has a brother from [place] who can provide assistance at home if needed. [facility] list of personal care services provided as aresourcewheneverneeded and notified him ofthe need topay out of the pocket for it.  [facility] prior to returning home as stated.  Patient voiced understanding to call primary care provider's office when he gets backhome, for a post discharge follow-up appointment within a week or sooner if needs arise.Patient letter (with PCP's contact number) wasprovided as his reminder.  Discussed with [facility]arding [facility] for him but he declined and denied need for management at home. He reports that recent A1c is 5.8. He mentioned not taking any medications for DM or COPD- ("not bothered by it"). He admits not planning to quit tobacco use as well.   [facility] care management contact information provided for any future needs that he may have.   For questions, please contact:  Dannielle Huh, BSN, RN- [facility] Primary Care Navigator  Telephone: [phone] [place]

## 2016-06-24 NOTE — Care Management Note (Signed)
Case Management Note  Patient Details  Name: Donald Richardson MRN: [id number] Date of Birth: 12/06/1940  Subjective/Objective:                    Action/Plan: Pt discharging to CIR today. No further needs per CM.   Expected Discharge Date:  06/24/16               Expected Discharge Plan:  IP Rehab Facility  In-House Referral:  Clinical Social Work  Discharge planning Services     Post Acute Care Choice:    Choice offered to:     DME Arranged:    DME Agency:     HH Arranged:    [place] Agency:     Status of Service:  Completed, signed off  If discussed at [employer], dates discussed:    Additional Comments:  Pollie Friar, RN 06/24/2016, 10:39 AM

## 2016-06-24 NOTE — H&P (Signed)
Physical Medicine and Rehabilitation Admission H&P       Chief Complaint  Patient presents with  . Weakness  : HPI: Donald Richardson a 76 y.o.right handed malewith history of tobacco abuse/COPD, thrombocytopenia, arteriosclerotic heart disease with stenting maintained on aspirin and Plavix, diet controlled diabetes mellitus, hyperlipidemia and hypertension. History taken from chart review. Patient lives alone and was independent prior to admission. One level home with 2 steps to entry.Closest family is  brother in [place] who plans to assist if needed .Presented 06/20/2016 for left-sided weakness and fall 2 without loss of consciousness. CT of the head showed a 9 mm hyperdense focus near the head of the right caudate no significant surrounding edema or mass effect. MRI reviewed, showing right caudate infarct. Per report, acute infarct of the right caudate tail extending inferiorly along the posterior limb of the right internal capsule. MRA unremarkable. Carotid Dopplers in no ICA stenosis. Echocardiogram with ejection fraction of 14% grade 2 diastolic dysfunction. Neurology consulted presently on aspirin and Plavix for CVA prophylaxis. Subcutaneous Lovenox for DVT prophylaxis. Tolerating a regular consistency diet. Physical and occupational therapy evaluation completed with recommendations of physical medicine rehabilitation consult. Patient was admitted for comprehensive rehabilitation program  Review of Systems  Constitutional: Negative for chills and fever.  HENT: Negative for hearing loss.   Eyes: Negative for blurred vision and double vision.  Respiratory: Positive for cough and shortness of breath.   Cardiovascular: Negative for chest pain, palpitations and leg swelling.  Gastrointestinal: Positive for constipation. Negative for nausea and vomiting.  Genitourinary: Positive for frequency and urgency. Negative for dysuria, flank pain and hematuria.    Musculoskeletal: Positive for joint pain and myalgias.  Skin: Negative for rash.  Neurological: Positive for sensory change and focal weakness. Negative for seizures.  All other systems reviewed and are negative.      Past Medical History:  Diagnosis Date  . ASHD (arteriosclerotic heart disease)    STENT  . COPD (chronic obstructive pulmonary disease) ([place])   . Diabetes mellitus    Hgb A1C- 5.8 in May 2017  . Hemorrhoids   . Hyperlipidemia   . Hypertension   . Smoker         Past Surgical History:  Procedure Laterality Date  . INGUINAL HERNIA REPAIR Left 10/26/2015   Procedure: LEFT INGUINAL HERNIA REPAIR WITH MESH;  Surgeon: Jackolyn Confer, MD;  Location: [facility];  Service: General;  Laterality: Left;  . INSERTION OF MESH Left 10/26/2015   Procedure: INSERTION OF MESH;  Surgeon: Jackolyn Confer, MD;  Location: [facility];  Service: General;  Laterality: Left;  . NO PAST SURGERIES          Family History  Problem Relation Age of Onset  . Breast cancer Mother   . Parkinson's disease Father    Social History:  reports that he has been smoking Cigarettes.  He has been smoking about 1.00 pack per day. He has never used smokeless tobacco. He reports that he does not drink alcohol or use drugs. Allergies: No Known Allergies       Medications Prior to Admission  Medication Sig Dispense Refill  . aspirin EC 81 MG tablet Take 81 mg by mouth daily.    . clopidogrel (PLAVIX) 75 MG tablet Take 75 mg by mouth daily.     Marland Kitchen lisinopril (PRINIVIL,ZESTRIL) 10 MG tablet Take 1 tablet (10 mg total) by mouth daily. 90 tablet 3  . rosuvastatin (CRESTOR) 40 MG tablet Take  1 tablet (40 mg total) by mouth daily. (Patient taking differently: Take 20 mg by mouth at bedtime. ) 90 tablet 3    Home: Home Living Family/patient expects to be discharged to:: Private residence Living Arrangements: Alone Available Help at Discharge: Friend(s), Available PRN/intermittently Type of  Home: House Home Access: Stairs to enter Technical brewer of Steps: 2 Entrance Stairs-Rails: None Home Layout: One level Bathroom Shower/Tub: Chiropodist: Standard Bathroom Accessibility: Yes Home Equipment: None Additional Comments: loves airplanes   Functional History: Prior Function Level of Independence: Independent Comments: pt drives  Functional Status:  Mobility: Bed Mobility Overal bed mobility: Needs Assistance Bed Mobility: Sit to Supine Sit to supine: Supervision General bed mobility comments: OOB in chair on arrival. Transfers Overall transfer level: Needs assistance Equipment used: Rolling walker (2 wheeled) Transfers: Sit to/from Stand Sit to Stand: Min assist General transfer comment: Min assist for steadying and VC's for safe hand placement with RW. Ambulation/Gait Ambulation/Gait assistance: Min assist, Mod assist Ambulation Distance (Feet): 70 Feet Assistive device: Rolling walker (2 wheeled) (30 ft with RW, 70 ft with HHA -moderate assist) Gait Pattern/deviations: Decreased weight shift to left, Decreased step length - left, Decreased dorsiflexion - left, Staggering right General Gait Details: patient able to ambulate with min assist and RW, max multi modal cues for positioning and posture, LLE drag and difficulty with placement. Attempted HHA with higher level tasks patient requiring moderate assist to maintain upright positioning.  Gait velocity: decreased Gait velocity interpretation: <1.8 ft/sec, indicative of risk for recurrent falls  ADL: ADL Overall ADL's : Needs assistance/impaired Eating/Feeding: Set up, Sitting Eating/Feeding Details (indicate cue type and reason): Assist to open container of peaches. Grooming: Wash/dry hands, Wash/dry face, Oral care, Minimal assistance, Standing Grooming Details (indicate cue type and reason): Assistance for balance and VC's for awareness and to prevent lateral leaning. Upper Body  Bathing: Min guard, Sitting Lower Body Bathing: Minimal assistance, Sitting/lateral leans Upper Body Dressing : Sitting, Min guard Lower Body Dressing: Minimal assistance, Sitting/lateral leans Toilet Transfer: Minimal assistance, Ambulation, RW Toilet Transfer Details (indicate cue type and reason): Vc for safety and sequencing with RW as well as for upright posturing. Toileting- Clothing Manipulation and Hygiene: Moderate assistance, Sit to/from stand Toileting - Clothing Manipulation Details (indicate cue type and reason): Mod assist for balance during pericare and as pt fatigued, required assistance to thoroughness. Tub/ Shower Transfer: Tub transfer, Moderate assistance, Ambulation, Rolling walker, 3 in 1 Functional mobility during ADLs: Minimal assistance, Rolling walker, Cueing for safety, Cueing for sequencing General ADL Comments: Pt demonstrating emergent awareness during session. Noted decreased L hand fine motor coordination when attempting to pick up small hearing aid battery.  Cognition: Cognition Overall Cognitive Status: No family/caregiver present to determine baseline cognitive functioning Orientation Level: Oriented X4 Cognition Arousal/Alertness: Awake/alert Behavior During Therapy: Impulsive Overall Cognitive Status: No family/caregiver present to determine baseline cognitive functioning General Comments: Pt reporting decreased awareness that he had urinated this am requiring assistance for bath from nursing staff. Pt with decreasede overall safety awareness and moving quickly. On entry into room pt shoveling food into mouth quickly.  Physical Exam: Blood pressure (!) 150/73, pulse (!) 45, temperature 98.3 F (36.8 C), temperature source Oral, resp. rate 20, height 5' 8"  (1.727 m), weight 67.1 kg (147 lb 14.4 oz), SpO2 93 %. Physical Exam  Vitals reviewed. Constitutional: He appears well-developed.  HENT:  Head: Normocephalic.  Eyes: EOM are normal. Left eye  exhibits no discharge.  Neck: Normal range of  motion. Neck supple. No thyromegaly present.  Cardiovascular: Exam reveals no friction rub.   No murmur heard. Bradycardia 40's to 50's.   Respiratory: Effort normal and breath sounds normal. No respiratory distress. He has no wheezes. He has no rales.  GI: Soft. He exhibits no distension. There is no tenderness. There is no rebound.  Genitourinary:  Genitourinary Comments: Urine with foul odor on underwear  Skin. Warm and dry. Some bruising and abrasions along left forearm and upper arm.  Neurological: He is alertand oriented to person, place, and time Fair awareness and insight. Mild left facial weakness.  Motor: RUE/RLE: 4+/5 proximal to distal LUE/LLE: 4/5 biceps, triceps, shoulders, wrist, hand, HF, KE, ADF/PF. + left Pronator drift and decreased [place] LUE with FTN. Has persistent pill rolling tremor RUE with +/- cogwheeling rigidity with ROM.  DTRs symmetric Psych: pleasant and generally cooperative.    Lab Results Last 48 Hours        Results for orders placed or performed during the hospital encounter of 06/20/16 (from the past 48 hour(s))  Comprehensive metabolic panel     Status: Abnormal   Collection Time: 06/23/16  5:48 AM  Result Value Ref Range   Sodium 139 135 - 145 mmol/L   Potassium 3.6 3.5 - 5.1 mmol/L   Chloride 107 101 - 111 mmol/L   CO2 26 22 - 32 mmol/L   Glucose, Bld 92 65 - 99 mg/dL   BUN 15 6 - 20 mg/dL   Creatinine, Ser 1.25 (H) 0.61 - 1.24 mg/dL   Calcium 8.6 (L) 8.9 - 10.3 mg/dL   Total Protein 5.8 (L) 6.5 - 8.1 g/dL   Albumin 3.1 (L) 3.5 - 5.0 g/dL   AST 14 (L) 15 - 41 U/L   ALT 11 (L) 17 - 63 U/L   Alkaline Phosphatase 65 38 - 126 U/L   Total Bilirubin 0.6 0.3 - 1.2 mg/dL   GFR calc non Af Amer 55 (L) >60 mL/min   GFR calc Af Amer >60 >60 mL/min    Comment: (NOTE) The eGFR has been calculated using the CKD EPI equation. This calculation has not been validated in all clinical  situations. eGFR's persistently <60 mL/min signify possible Chronic Kidney Disease.    Anion gap 6 5 - 15  CBC     Status: Abnormal   Collection Time: 06/23/16  5:48 AM  Result Value Ref Range   WBC 7.0 4.0 - 10.5 K/uL   RBC 4.69 4.22 - 5.81 MIL/uL   Hemoglobin 13.9 13.0 - 17.0 g/dL   HCT 41.8 39.0 - 52.0 %   MCV 89.1 78.0 - 100.0 fL   MCH 29.6 26.0 - 34.0 pg   MCHC 33.3 30.0 - 36.0 g/dL   RDW 14.2 11.5 - 15.5 %   Platelets 106 (L) 150 - 400 K/uL    Comment: CONSISTENT WITH PREVIOUS RESULT     Imaging Results (Last 48 hours)  No results found.       Medical Problem List and Plan: 1.  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 2.  DVT Prophylaxis/Anticoagulation: Subcutaneous Lovenox. Monitor platelet counts of any signs of bleeding 3. Pain Management: Tylenol as needed 4. Mood: Provide emotional support 5. Neuropsych: This patient is capable of making decisions on his own  behalf. 6. Skin/Wound Care: Routine skin checks 7. Fluids/Electrolytes/Nutrition: Routine I&O with follow-up chemistries 8. Arteriosclerotic heart disease with stenting. Continue aspirin and Plavix 9. Diet-controlled diabetes mellitus. Hemoglobin A1c 5.8. CBG checks discontinued 10. History of hypertension. Permissive hypertension. Resume lisinopril 10 mg daily as needed 11. Tobacco abuse. Nicoderm patch. Provide counseling 12. Hyperlipidemia. Crestor 13. History of Thrombocytopenia. 117,000. Follow-up CBC 14. Urinary frequency, malodorous urine: check ua and ucx  Post Admission Physician Evaluation: 1. Functional deficits secondary  to right caudate infarct. 2. Patient is admitted to receive collaborative, interdisciplinary care between the physiatrist, rehab nursing staff, and therapy team. 3. Patient's level of medical complexity and substantial  therapy needs in context of that medical necessity cannot be provided at a lesser intensity of care such as a SNF. 4. Patient has experienced substantial functional loss from his/her baseline which was documented above under the "Functional History" and "Functional Status" headings.  Judging by the patient's diagnosis, physical exam, and functional history, the patient has potential for functional progress which will result in measurable gains while on inpatient rehab.  These gains will be of substantial and practical use upon discharge  in facilitating mobility and self-care at the household level. 5. Physiatrist will provide 24 hour management of medical needs as well as oversight of the therapy plan/treatment and provide guidance as appropriate regarding the interaction of the two. 6. The Preadmission Screening has been reviewed and patient status is unchanged unless otherwise stated above. 7. 24 hour rehab nursing will assist with bladder management, bowel management, safety, skin/wound care, disease management, medication administration, pain management and patient education  and help integrate therapy concepts, techniques,education, etc. 8. PT will assess and treat for/with: Lower extremity strength, range of motion, stamina, balance, functional mobility, safety, adaptive techniques and equipment, NMR, orthotics, community reintegration.   Goals are: mod I. 9. OT will assess and treat for/with: ADL's, functional mobility, safety, upper extremity strength, adaptive techniques and equipment, NMR, family/pt education, ego support, community reintegration.   Goals are: mod I. Therapy may proceed with showering this patient. 10. SLP will assess and treat for/with: n/a.  Goals are: n/a. 11. Case Management and Social Worker will assess and treat for psychological issues and discharge planning. 12. Team conference will be held weekly to assess progress toward goals and to determine barriers to  discharge. 13. Patient will receive at least 3 hours of therapy per day at least 5 days per week. 14. ELOS: 16-20 days       15. Prognosis:  excellent     Meredith Staggers, MD, [facility] 06/24/2016  Cathlyn Parsons., PA-C 06/24/2016

## 2016-06-24 NOTE — Progress Notes (Signed)
Report given to accepting nurse at [facility].

## 2016-06-24 NOTE — Progress Notes (Signed)
Pt arrived to room 4MW06 accompanied by friend. A&Ox4. Denies pain, VSS except brady at 44.  Asymptomatic. Rate ranged 41-47 per report 5C. Oriented to room and rehab process.

## 2016-06-24 NOTE — Progress Notes (Signed)
Donald [name], MD Physician Signed Physical Medicine and Rehabilitation  Consult Note Date of Service: 06/23/2016 5:51 AM  Related encounter: ED to Hosp-Admission (Current) from 06/20/2016 in [facility] All Collapse All   [] Hide copied text [] Hover for attribution information      Physical Medicine and Rehabilitation Consult Reason for Consult: Left side weakness Referring Physician: [facility]   HPI: Donald Richardson is a 76 y.o. right handed male with history of tobacco abuse, thrombocytopenia, arteriosclerotic heart disease, diabetes mellitus, hyperlipidemia and hypertension. History taken from chart review. Patient lives alone and was independent prior to admission. One level home with 2 steps to entry. Closest family is in [place]. Presented 06/20/2016 for left-sided weakness and fall 2 without loss of consciousness. CT of the head showed a 9 mm hyperdense focus near the head of the left caudate no significant surrounding edema or mass effect. MRI reviewed, showing right caudate infarct. Per report, acute infarct of the right caudate tail extending inferiorly along the posterior limb of the right internal capsule. MRA unremarkable. Carotid Dopplers in no ICA stenosis. Echocardiogram with ejection fraction of 73% grade 2 diastolic dysfunction. Neurology consulted presently on aspirin and Plavix for CVA prophylaxis. Subcutaneous Lovenox for DVT prophylaxis. Tolerating a regular consistency diet. Physical therapy evaluation completed with recommendations of physical medicine rehabilitation consult.   Review of Systems  Constitutional: Negative for chills and fever.  HENT: Negative for hearing loss.   Eyes: Negative for blurred vision and double vision.  Respiratory: Positive for shortness of breath.   Cardiovascular: Negative for chest pain, palpitations and leg swelling.  Gastrointestinal: Positive for constipation.  Negative for nausea and vomiting.  Genitourinary: Positive for urgency. Negative for hematuria.  Musculoskeletal: Negative for joint pain and myalgias.  Skin: Negative for rash.  Neurological: Positive for sensory change, focal weakness and weakness.  All other systems reviewed and are negative.      Past Medical History:  Diagnosis Date  . ASHD (arteriosclerotic heart disease)    STENT  . COPD (chronic obstructive pulmonary disease) ([place])   . Diabetes mellitus    Hgb A1C- 5.8 in May 2017  . Hemorrhoids   . Hyperlipidemia   . Hypertension   . Smoker         Past Surgical History:  Procedure Laterality Date  . INGUINAL HERNIA REPAIR Left 10/26/2015   Procedure: LEFT INGUINAL HERNIA REPAIR WITH MESH;  Surgeon: Jackolyn Confer, MD;  Location: [facility];  Service: General;  Laterality: Left;  . INSERTION OF MESH Left 10/26/2015   Procedure: INSERTION OF MESH;  Surgeon: Jackolyn Confer, MD;  Location: [facility];  Service: General;  Laterality: Left;  . NO PAST SURGERIES          Family History  Problem Relation Age of Onset  . Breast cancer Mother   . Parkinson's disease Father    Social History:  reports that he has been smoking Cigarettes.  He has been smoking about 1.00 pack per day. He has never used smokeless tobacco. He reports that he does not drink alcohol or use drugs. Allergies: No Known Allergies       Medications Prior to Admission  Medication Sig Dispense Refill  . aspirin EC 81 MG tablet Take 81 mg by mouth daily.    . clopidogrel (PLAVIX) 75 MG tablet Take 75 mg by mouth daily.     Marland Kitchen lisinopril (PRINIVIL,ZESTRIL) 10 MG tablet Take 1  tablet (10 mg total) by mouth daily. 90 tablet 3  . rosuvastatin (CRESTOR) 40 MG tablet Take 1 tablet (40 mg total) by mouth daily. (Patient taking differently: Take 20 mg by mouth at bedtime. ) 90 tablet 3    Home: Home Living Family/patient expects to be discharged to:: Private residence Living Arrangements:  Alone Available Help at Discharge: Friend(s), Available PRN/intermittently Type of Home: House Home Access: Stairs to enter Technical brewer of Steps: 2 Entrance Stairs-Rails: None Home Layout: One level Bathroom Shower/Tub: Chiropodist: Standard Bathroom Accessibility: Yes Home Equipment: None Additional Comments: loves airplanes  Functional History: Prior Function Level of Independence: Independent Comments: pt drives Functional Status:  Mobility: Bed Mobility Overal bed mobility: Needs Assistance Bed Mobility: Sit to Supine Sit to supine: Supervision General bed mobility comments: pt received up with OT. Sit to supine with supervision, able to position self in bed with increased time Transfers Overall transfer level: Needs assistance Equipment used: Rolling walker (2 wheeled) Transfers: Sit to/from Stand Sit to Stand: Min assist General transfer comment: min A for steadying and vc's for use of RW, unfamiliar with use of RW prior to this episode Ambulation/Gait Ambulation/Gait assistance: Min assist, Mod assist Ambulation Distance (Feet): 70 Feet Assistive device: Rolling walker (2 wheeled) (30 ft with RW, 70 ft with HHA -moderate assist) Gait Pattern/deviations: Decreased weight shift to left, Decreased step length - left, Decreased dorsiflexion - left, Staggering right General Gait Details: patient able to ambulate with min assist and RW, max multi modal cues for positioning and posture, LLE drag and difficulty with placement. Attempted HHA with higher level tasks patient requiring moderate assist to maintain upright positioning.  Gait velocity: decreased Gait velocity interpretation: <1.8 ft/sec, indicative of risk for recurrent falls  ADL: ADL Overall ADL's : Needs assistance/impaired Eating/Feeding: Modified independent, Sitting Grooming: Wash/dry hands, Wash/dry face, Oral care, Minimal assistance, Standing Grooming Details (indicate cue  type and reason): assist for balance, Pt progressively fatigued leaning to left more as time went on, unable to maintain standing for shaving Upper Body Bathing: Min guard, Sitting Lower Body Bathing: Minimal assistance, Sitting/lateral leans Upper Body Dressing : Sitting, Min guard Lower Body Dressing: Minimal assistance, Sitting/lateral leans Toilet Transfer: Moderate assistance, Cueing for safety, Cueing for sequencing, Ambulation, Regular Toilet, RW Toilet Transfer Details (indicate cue type and reason): vc for safety and sequencing with RW Toileting- Clothing Manipulation and Hygiene: Minimal assistance, Sit to/from stand Toileting - Clothing Manipulation Details (indicate cue type and reason): standing for urination Tub/ Shower Transfer: Tub transfer, Moderate assistance, Ambulation, Rolling walker, 3 in 1 Functional mobility during ADLs: Minimal assistance, Rolling walker, Cueing for safety, Cueing for sequencing General ADL Comments: Pt very motivated to be independent and remain independent  Cognition: Cognition Overall Cognitive Status: No family/caregiver present to determine baseline cognitive functioning Orientation Level: Oriented X4 Cognition Arousal/Alertness: Awake/alert Behavior During Therapy: Impulsive Overall Cognitive Status: No family/caregiver present to determine baseline cognitive functioning General Comments: Pt unaware of drooling during grooming and nursing reported he had urinated in bed and was unaware. Decreased safety awareness.   Blood pressure (!) 154/73, pulse (!) 52, temperature 98.7 F (37.1 C), temperature source Oral, resp. rate 18, height 5\' 8"  (1.727 m), weight 67.1 kg (147 lb 14.4 oz), SpO2 94 %. Physical Exam  Vitals reviewed. Constitutional: He is oriented to person, place, and time. He appears well-developed.  Frail  HENT:  Head: Normocephalic and atraumatic.  Eyes: Conjunctivae and EOM are normal.  Neck: Normal range  of motion. Neck  supple. No thyromegaly present.  Cardiovascular: Regular rhythm.   Bradycardia  Respiratory: Effort normal and breath sounds normal. No respiratory distress.  GI: Soft. Bowel sounds are normal. He exhibits no distension.  Musculoskeletal: He exhibits no edema or tenderness.  Neurological: He is alert and oriented to person, place, and time.  Follows simple commands.  Fair awareness of deficits Motor: RUE/RLE: 4+/5 proximal to distal LUE/LLE: 4/5 proximal to distal DTRs symmetric  Skin: Skin is warm and dry.  Psychiatric: His affect is blunt. His speech is delayed. He is slowed.    Lab Results Last 24 Hours  No results found for this or any previous visit (from the past 24 hour(s)).   Imaging Results (Last 48 hours)  No results found.    Assessment/Plan: Diagnosis: Right caudate infact Labs and images independently reviewed.  Records reviewed and summated above. Stroke: Continue secondary stroke prophylaxis and Risk Factor Modification listed below:   Blood Pressure Management:  Continue current medication with prn's with permisive HTN per primary team Statin Agent:   Ddiabetes management:   Tobacco abuse:   Left sided hemiparesis: fit for orthosis to prevent contractures Motor recovery: Fluoxetine   1. Does the need for close, 24 hr/day medical supervision in concert with the patient's rehab needs make it unreasonable for this patient to be served in a less intensive setting? Yes 2. Co-Morbidities requiring supervision/potential complications: tobacco abuse (counsel), Thrombocytopenia (< 60,000/mm3 no resistive exercise), arteriosclerotic heart disease (cont meds), diabetes mellitus (Monitor in accordance with exercise and adjust meds as necessary), hyperlipidemia (cont meds), hypertension (monitor and provide prns in accordance with increased physical exertion and pain), combined CHF (Monitor in accordance with increased physical activity and avoid UE resistance excercises),  CKD (avoid nephrotoxic meds) 3. Due to safety, disease management and patient education, does the patient require 24 hr/day rehab nursing? Yes 4. Does the patient require coordinated care of a physician, rehab nurse, PT (1-2 hrs/day, 5 days/week) and OT (1-2 hrs/day, 5 days/week) to address physical and functional deficits in the context of the above medical diagnosis(es)? Yes Addressing deficits in the following areas: balance, endurance, locomotion, strength, transferring, bathing, dressing, toileting and psychosocial support 5. Can the patient actively participate in an intensive therapy program of at least 3 hrs of therapy per day at least 5 days per week? Yes 6. The potential for patient to make measurable gains while on inpatient rehab is excellent 7. Anticipated functional outcomes upon discharge from inpatient rehab are supervision  with PT, supervision with OT, n/a with SLP. 8. Estimated rehab length of stay to reach the above functional goals is: 16-20 days. 9. Does the patient have adequate social supports and living environment to accommodate these discharge functional goals? Potentially 10. Anticipated D/C setting: TBD 11. Anticipated post D/C treatments: HH therapy and Home excercise program 12. Overall Rehab/Functional Prognosis: good  RECOMMENDATIONS: This patient's condition is appropriate for continued rehabilitative care in the following setting: CIR if caregiver support available at discharge, at pt will likely require some assistance. Patient has agreed to participate in recommended program. Potentially Note that insurance prior authorization may be required for reimbursement for recommended care.  Comment: Rehab Admissions Coordinator to follow up.  Delice Lesch, MD, Mellody Drown Cathlyn Parsons., PA-C 06/23/2016    Revision History                        Routing History

## 2016-06-24 NOTE — Care Management Important Message (Signed)
Important Message  Patient Details  Name: Donald Richardson MRN: [id number] Date of Birth: [birth year]   Medicare Important Message Given:  Yes    Orbie Pyo 06/24/2016, 3:09 PM

## 2016-06-25 ENCOUNTER — Inpatient Hospital Stay (HOSPITAL_COMMUNITY): Payer: Medicare Other | Admitting: Occupational Therapy

## 2016-06-25 ENCOUNTER — Inpatient Hospital Stay (HOSPITAL_COMMUNITY): Payer: Medicare Other | Admitting: Physical Therapy

## 2016-06-25 DIAGNOSIS — I679 Cerebrovascular disease, unspecified: Secondary | ICD-10-CM

## 2016-06-25 LAB — VAS US CAROTID
LEFT ECA DIAS: -14 cm/s
LEFT VERTEBRAL DIAS: -9 cm/s
Left CCA dist dias: -16 cm/s
Left CCA dist sys: -87 cm/s
Left CCA prox dias: 13 cm/s
Left CCA prox sys: 89 cm/s
Left ICA dist dias: -22 cm/s
Left ICA dist sys: -88 cm/s
Left ICA prox dias: -12 cm/s
Left ICA prox sys: -50 cm/s
RCCADSYS: -85 cm/s
RCCAPDIAS: 18 cm/s
RCCAPSYS: 113 cm/s
RIGHT ECA DIAS: -17 cm/s
RIGHT VERTEBRAL DIAS: 6 cm/s

## 2016-06-25 LAB — CBC WITH DIFFERENTIAL/PLATELET
Basophils Absolute: 0 10*3/uL (ref 0.0–0.1)
Basophils Relative: 0 %
EOS ABS: 0.3 10*3/uL (ref 0.0–0.7)
Eosinophils Relative: 4 %
HEMATOCRIT: 44.9 % (ref 39.0–52.0)
HEMOGLOBIN: 14.8 g/dL (ref 13.0–17.0)
Lymphocytes Relative: 14 %
Lymphs Abs: 1.1 10*3/uL (ref 0.7–4.0)
MCH: 30 pg (ref 26.0–34.0)
MCHC: 33 g/dL (ref 30.0–36.0)
MCV: 90.9 fL (ref 78.0–100.0)
MONOS PCT: 9 %
Monocytes Absolute: 0.7 10*3/uL (ref 0.1–1.0)
NEUTROS PCT: 73 %
Neutro Abs: 5.4 10*3/uL (ref 1.7–7.7)
Platelets: 109 10*3/uL — ABNORMAL LOW (ref 150–400)
RBC: 4.94 MIL/uL (ref 4.22–5.81)
RDW: 14.5 % (ref 11.5–15.5)
WBC: 7.4 10*3/uL (ref 4.0–10.5)

## 2016-06-25 LAB — COMPREHENSIVE METABOLIC PANEL
ALK PHOS: 71 U/L (ref 38–126)
ALT: 17 U/L (ref 17–63)
ANION GAP: 7 (ref 5–15)
AST: 19 U/L (ref 15–41)
Albumin: 3.1 g/dL — ABNORMAL LOW (ref 3.5–5.0)
BILIRUBIN TOTAL: 0.8 mg/dL (ref 0.3–1.2)
BUN: 20 mg/dL (ref 6–20)
CALCIUM: 8.6 mg/dL — AB (ref 8.9–10.3)
CO2: 27 mmol/L (ref 22–32)
Chloride: 105 mmol/L (ref 101–111)
Creatinine, Ser: 1.4 mg/dL — ABNORMAL HIGH (ref 0.61–1.24)
GFR, EST AFRICAN AMERICAN: 56 mL/min — AB (ref >60)
GFR, EST NON AFRICAN AMERICAN: 48 mL/min — AB (ref >60)
Glucose, Bld: 129 mg/dL — ABNORMAL HIGH (ref 65–99)
Potassium: 4.1 mmol/L (ref 3.5–5.1)
Sodium: 139 mmol/L (ref 135–145)
TOTAL PROTEIN: 6.1 g/dL — AB (ref 6.5–8.1)

## 2016-06-25 MED ORDER — PRO-STAT SUGAR FREE PO LIQD
30.0000 mL | Freq: Two times a day (BID) | ORAL | Status: DC
  Administered 2016-06-25 – 2016-07-09 (×28): 30 mL via ORAL
  Filled 2016-06-25 (×28): qty 30

## 2016-06-25 MED ORDER — BETAMETHASONE VALERATE 0.1 % EX LOTN
TOPICAL_LOTION | Freq: Every day | CUTANEOUS | Status: DC
Start: 2016-06-25 — End: 2016-06-25
  Filled 2016-06-25: qty 60

## 2016-06-25 MED ORDER — HYDROCORTISONE 0.5 % EX CREA
TOPICAL_CREAM | Freq: Every day | CUTANEOUS | Status: DC
  Administered 2016-06-25 – 2016-06-29 (×5): via TOPICAL
  Administered 2016-06-30: 1 via TOPICAL
  Administered 2016-07-01 – 2016-07-08 (×7): via TOPICAL
  Administered 2016-07-09: 1 via TOPICAL
  Filled 2016-06-25: qty 28.35

## 2016-06-25 NOTE — Evaluation (Signed)
Occupational Therapy Assessment and Plan  Patient Details  Name: Donald Richardson MRN: [id number] Date of Birth: 05/22/1940  OT Diagnosis: hemiplegia affecting non-dominant side and muscle weakness (generalized) Rehab Potential: Rehab Potential (ACUTE ONLY): Good ELOS: 14 days   Today's Date: 06/25/2016 OT Individual Time: 2563-8937 OT Total Time: 60 min       Problem List:  Patient Active Problem List   Diagnosis Date Noted  . Small vessel disease, cerebrovascular 06/24/2016  . Left hemiparesis ([place])   . Thrombocytopenia ([place])   . Acute on chronic combined systolic and diastolic CHF (congestive heart failure) ([place])   . RBBB 06/21/2016  . CKD (chronic kidney disease), stage III 06/21/2016  . Basal ganglia infarction ([place]) 06/20/2016  . H/O cataract extraction 06/26/2014  . COLD (chronic obstructive lung disease) ([place]) 02/27/2014  . Essential hypertension 06/16/2012  . Diabetes mellitus type 2, controlled, with complications ([place]) 34/28/7681  . Hyperlipidemia associated with type 2 diabetes mellitus ([place]) 08/14/2008  . TOBACCO ABUSE 08/14/2008  . CAD, NATIVE VESSEL 08/14/2008  . Bradycardia 08/14/2008    Past Medical History:  Past Medical History:  Diagnosis Date  . ASHD (arteriosclerotic heart disease)    STENT  . COPD (chronic obstructive pulmonary disease) ([place])   . Diabetes mellitus    Hgb A1C- 5.8 in May 2017  . Hemorrhoids   . Hyperlipidemia   . Hypertension   . Smoker    Past Surgical History:  Past Surgical History:  Procedure Laterality Date  . INGUINAL HERNIA REPAIR Left 10/26/2015   Procedure: LEFT INGUINAL HERNIA REPAIR WITH MESH;  Surgeon: Jackolyn Confer, MD;  Location: [facility];  Service: General;  Laterality: Left;  . INSERTION OF MESH Left 10/26/2015   Procedure: INSERTION OF MESH;  Surgeon: Jackolyn Confer, MD;  Location: [facility];  Service: General;  Laterality: Left;  . NO PAST SURGERIES      Assessment & Plan Clinical Impression: Donald Richardson a  76 y.o.right handed malewith history of tobacco abuse/COPD, thrombocytopenia, arteriosclerotic heart disease with stenting maintained on aspirin and Plavix,diet controlleddiabetes mellitus, hyperlipidemia and hypertension. History taken from chart review. Patient lives alone and was independent prior to admission. One level home with 2 steps to entry.Closest family is brotherin [employer] to assist if needed.Presented 06/20/2016 for left-sided weakness and fall 2 without loss of consciousness. CT of the head showed a 9 mm hyperdense focus near the head of the left caudate no significant surrounding edema or mass effect. MRI reviewed, showing right caudate infarct. Per report, acute infarct of the right caudate tail extending inferiorly along the posterior limb of the right internal capsule. MRA unremarkable. Carotid Dopplers in no ICA stenosis. Echocardiogram with ejection fraction of 15% grade 2 diastolic dysfunction. Neurology consulted presently on aspirin and Plavix for CVA prophylaxis. Subcutaneous Lovenox for DVT prophylaxis. Tolerating a regular consistency diet. Physicaland occupationaltherapy evaluation completed with recommendations of physical medicine rehabilitation consult. Patient was admitted for comprehensive rehabilitation program 06/24/16.   Patient transferred to CIR on 06/24/2016 .    Patient currently requires min with basic self-care skills secondary to muscle weakness, decreased cardiorespiratoy endurance, decreased coordination and decreased sitting balance, decreased standing balance, decreased postural control, hemiplegia and decreased balance strategies.  Prior to hospitalization, patient could complete ADLs/IADLs with independent .  Patient will benefit from skilled intervention to increase independence with basic self-care skills and increase level of independence with iADL prior to discharge home independently.  Anticipate patient will require  intermittent supervision and  follow up home health.  OT - End of Session Activity Tolerance: Tolerates 10 - 20 min activity with multiple rests Endurance Deficit: Yes Endurance Deficit Description: Required rest breaks throughout bathing/dressing tasks OT Assessment Rehab Potential (ACUTE ONLY): Good Barriers to Discharge: Decreased caregiver support Barriers to Discharge Comments: Pt lives alone, brother in [place], [place] OT Patient demonstrates impairments in the following area(s): Balance;Endurance;Motor;Safety OT Basic ADL's Functional Problem(s): Grooming;Bathing;Dressing;Toileting OT Advanced ADL's Functional Problem(s): Simple Meal Preparation;Laundry;Light Housekeeping OT Transfers Functional Problem(s): Toilet;Tub/Shower OT Additional Impairment(s): None OT Plan OT Intensity: Minimum of 1-2 x/day, 45 to 90 minutes OT Frequency: 5 out of 7 days OT Duration/Estimated Length of Stay: 14 days OT Treatment/Interventions: Balance/vestibular training;Discharge planning;Community reintegration;Disease Lawyer;Functional mobility training;Neuromuscular re-education;Pain management;Psychosocial support;Patient/family education;Self Care/advanced ADL retraining;Therapeutic Activities;Therapeutic Exercise;UE/LE Strength taining/ROM;UE/LE Coordination activities OT Self Feeding Anticipated Outcome(s): Mod I OT Basic Self-Care Anticipated Outcome(s): Mod I OT Toileting Anticipated Outcome(s): Mod I OT Bathroom Transfers Anticipated Outcome(s): Mod I OT Recommendation Recommendations for Other Services: Therapeutic Recreation consult Therapeutic Recreation Interventions: Outing/community reintergration Patient destination: Home Follow Up Recommendations: Home health OT Equipment Recommended: Tub/shower bench;To be determined   Skilled Therapeutic Intervention Pt seen for OT eval and ADL bathing/dressing session. Pt in supine upon arrival,  agreeable to tx session, denying pain this morning. He transferred to EOB using hospital bed functions with assist of chuck pad to advance hips towards EOB. He ambulated throughout session with min A using RW, VCs provided for RW management in functional context. He bathed seated on tub transfer bench with VCs for sequencing and lateral leans to complete buttock hygiene. Toileting task and LB dressing completed with steadying assist for standing portions. Increased time required for all tasks.  Pt left seated EOB at end of session with hand off to PT.  Pt educated throughout session regarding role of OT, POC, and d/c planning.   OT Evaluation Precautions/Restrictions  Precautions Precautions: Fall Restrictions Weight Bearing Restrictions: No General Chart Reviewed: Yes Pain   No/ denies pain Home Living/Prior Functioning Home Living Family/patient expects to be discharged to:: Private residence Living Arrangements: Alone Available Help at Discharge: Friend(s), Available PRN/intermittently Type of Home: House Home Access: Stairs to enter Technical brewer of Steps: 2 Entrance Stairs-Rails: None Home Layout: One level Bathroom Shower/Tub: Optometrist: Yes Additional Comments: loves airplanes  Lives With: Alone IADL History Homemaking Responsibilities: Yes Current License: Yes Mode of Transportation: Musician Occupation: Retired Prior Function Level of Independence: Independent with basic ADLs, Independent with homemaking with ambulation, Independent with gait, Independent with transfers  Able to Take Stairs?: Yes Driving: Yes Vocation: Retired Comments: pt drives Vision/Perception  Vision- Risk analyst: Within Designer, television/film set Perception: Within Functional Limits  Cognition Overall Cognitive Status: Within Functional Limits for tasks assessed Arousal/Alertness: Awake/alert Orientation Level:  Person;Place;Situation Person: Oriented Place: Oriented Situation: Oriented Year: 2018 Month: April Day of Week: Correct Memory: Appears intact Immediate Memory Recall: Sock;Blue;Bed Memory Recall: Sock;Blue;Bed Memory Recall Sock: Without Cue Memory Recall Blue: Without Cue Memory Recall Bed: Without Cue Awareness: Appears intact Problem Solving: Appears intact Safety/Judgment: Appears intact Sensation Sensation Light Touch: Appears Intact Proprioception: Appears Intact Coordination Gross Motor Movements are Fluid and Coordinated: No Fine Motor Movements are Fluid and Coordinated: No Coordination and Movement Description: Dysmetric Finger Nose Finger Test: WFL; slightly decreased speed with L UE Motor  Motor Motor: Abnormal postural alignment and control;Hemiplegia Motor - Skilled Clinical Observations: L sided weakness and mild decrease in coordination, LLE>LUE  Trunk/Postural Assessment  Cervical Assessment Cervical Assessment: Exceptions to [facility] (Forward head) Thoracic Assessment Thoracic Assessment: Exceptions to [facility] (Rounded shoulders) Lumbar Assessment Lumbar Assessment: Exceptions to [facility] (Posterior pelvic tilt) Postural Control Postural Control: Deficits on evaluation (Decreased standing balance)  Balance Balance Balance Assessed: Yes Standardized Balance Assessment Standardized Balance Assessment: Berg Balance Test;Dynamic Gait Index Berg Balance Test Sit to Stand: Able to stand  independently using hands Standing Unsupported: Able to stand safely 2 minutes Sitting with Back Unsupported but Feet Supported on Floor or Stool: Able to sit safely and securely 2 minutes Stand to Sit: Controls descent by using hands Transfers: Able to transfer safely, definite need of hands Standing Unsupported with Eyes Closed: Able to stand 10 seconds with supervision Standing Ubsupported with Feet Together: Able to place feet together independently and stand for 1 minute with  supervision From Standing, Reach Forward with Outstretched Arm: Can reach confidently >25 cm (10") From Standing Position, Pick up Object from Floor: Able to pick up shoe, needs supervision From Standing Position, Turn to Look Behind Over each Shoulder: Looks behind one side only/other side shows less weight shift Turn 360 Degrees: Able to turn 360 degrees safely but slowly Standing Unsupported, Alternately Place Feet on Step/Stool: Able to complete >2 steps/needs minimal assist Standing Unsupported, One Foot in Front: Able to take small step independently and hold 30 seconds Standing on One Leg: Tries to lift leg/unable to hold 3 seconds but remains standing independently Total Score: 39 Dynamic Gait Index Level Surface: Moderate Impairment Change in Gait Speed: Moderate Impairment Gait with Horizontal Head Turns: Moderate Impairment Gait with Vertical Head Turns: Severe Impairment Gait and Pivot Turn: Moderate Impairment Step Over Obstacle: Moderate Impairment Step Around Obstacles: Moderate Impairment Steps: Moderate Impairment Total Score: 7 Dynamic Sitting Balance Dynamic Sitting - Balance Support: During functional activity;Feet supported Dynamic Sitting - Level of Assistance: 5: Stand by assistance Dynamic Sitting - Balance Activities: Reaching for objects Static Standing Balance Static Standing - Balance Support: During functional activity;Right upper extremity supported;Left upper extremity supported Static Standing - Level of Assistance: 5: Stand by assistance;4: Min assist Dynamic Standing Balance Dynamic Standing - Balance Support: During functional activity;Right upper extremity supported;Left upper extremity supported Dynamic Standing - Level of Assistance: 4: Min assist Dynamic Standing - Balance Activities: Reaching for objects Dynamic Standing - Comments: Standing to complete LB clothing management/ toileting Extremity/Trunk Assessment RUE Assessment RUE Assessment:  Within Functional Limits LUE Assessment LUE Assessment: Within Functional Limits (4/5 throughout)   See Function Navigator for Current Functional Status.   Refer to Care Plan for Long Term Goals  Recommendations for other services: Therapeutic Recreation  Pet therapy, Kitchen group and Outing/community reintegration   Discharge Criteria: Patient will be discharged from OT if patient refuses treatment 3 consecutive times without medical reason, if treatment goals not met, if there is a change in medical status, if patient makes no progress towards goals or if patient is discharged from hospital.  The above assessment, treatment plan, treatment alternatives and goals were discussed and mutually agreed upon: by patient  Ernestina Patches 06/25/2016, 3:49 PM

## 2016-06-25 NOTE — Patient Care Conference (Signed)
Inpatient RehabilitationTeam Conference and Plan of Care Update Date: 06/25/2016   Time: 11:00 AM    Patient Name: Donald Richardson      Medical Record Number: [id number]  Date of Birth: [birth year] Sex: Male         Room/Bed: 4M06C/4M06C-01 Payor Info: Payor: MEDICARE / Plan: MEDICARE PART A AND B / Product Type: *No Product type* /    Admitting Diagnosis: CVA  Admit Date/Time:  06/24/2016  6:04 PM Admission Comments: No comment available   Primary Diagnosis:  <principal problem not specified> Principal Problem: <principal problem not specified>  Patient Active Problem List   Diagnosis Date Noted  . Small vessel disease, cerebrovascular 06/24/2016  . Left hemiparesis ([place])   . Thrombocytopenia ([place])   . Acute on chronic combined systolic and diastolic CHF (congestive heart failure) ([place])   . RBBB 06/21/2016  . CKD (chronic kidney disease), stage III 06/21/2016  . Basal ganglia infarction ([place]) 06/20/2016  . H/O cataract extraction 06/26/2014  . COLD (chronic obstructive lung disease) ([place]) 02/27/2014  . Essential hypertension 06/16/2012  . Diabetes mellitus type 2, controlled, with complications ([place]) 72/53/6644  . Hyperlipidemia associated with type 2 diabetes mellitus ([place]) 08/14/2008  . TOBACCO ABUSE 08/14/2008  . CAD, NATIVE VESSEL 08/14/2008  . Bradycardia 08/14/2008    Expected Discharge Date: Expected Discharge Date: 07/09/16  Team Members Present: Physician leading conference: Dr. Alysia Penna Social Worker Present: Ovidio Kin, LCSW Nurse Present: Heather Roberts, RN PT Present: Phylliss Bob, PTA;Barrie Folk, PT OT Present: Napoleon Form, OT SLP Present: Stormy Fabian, SLP PPS Coordinator present : Daiva Nakayama, RN, CRRN     Current Status/Progress Goal Weekly Team Focus  Medical   chronic smoker, cont bowel and bladder  Mod i functional goals maintain med stability  initiate rehab program   Bowel/Bladder   continent of bowel & bladder, LBM 06/24/16  continue to  be continent  continue to monitor & assist as needed   Swallow/Nutrition/ Hydration             ADL's   min-mod A overall  Mod I overall  ADL re-training; functional activity tolerance/ balance; functional transfers; safety   Mobility   Mod assist overall for transfers and gait without AD. min assist for WC mobility   Modified independent with all mobility including bed mobility, transfers, gait, stairs  endurance, L neuromotor control, balance, gait, safety with transfers.    Communication             Safety/Cognition/ Behavioral Observations            Pain   no c/o pain, has tylenol ordered prn  pain scale <3  continue to assess & treat as needed   Skin   multiple skin tags, spots, old lesion scars, loop recorder incision to left chest with gauze drsg, toenails are thick, mycotic & curling, no skin breakdown  no new areas of skin breakdown  assess q shift      *See Care Plan and progress notes for long and short-term goals.  Barriers to Discharge: needs to be Mod I    Possible Resolutions to Barriers:  cont rehab    Discharge Planning/Teaching Needs:    Home with intermittent assist from friends and neighbors. Needs to be mod/I level to return home safely.     Team Discussion:  Goals mod/I level, currently min assist and being evaluated by therapy team. Leans forward due to balance issues, will work on this while here. Motor planning  issues. Brother here from [place]  Revisions to Treatment Plan:  New evaluation DC target 5/9   Continued Need for Acute Rehabilitation Level of Care: The patient requires daily medical management by a physician with specialized training in physical medicine and rehabilitation for the following conditions: Daily direction of a multidisciplinary physical rehabilitation program to ensure safe treatment while eliciting the highest outcome that is of practical value to the patient.: Yes Daily medical management of patient stability for  increased activity during participation in an intensive rehabilitation regime.: Yes Daily analysis of laboratory values and/or radiology reports with any subsequent need for medication adjustment of medical intervention for : Neurological problems  Exodus Kutzer, Gardiner Rhyme 06/26/2016, 3:47 PM

## 2016-06-25 NOTE — Evaluation (Signed)
Physical Therapy Assessment and Plan  Patient Details  Name: Donald Richardson MRN: [id number] Date of Birth: 04/01/1940  PT Diagnosis: Abnormal posture, Abnormality of gait, Coordination disorder and Hemiplegia non-dominant Rehab Potential: Excellent ELOS: 14-16 days    Today's Date: 06/25/2016 PT Individual Time: 0930-1030 PT Individual Time Calculation (min): 60 min    Problem List:  Patient Active Problem List   Diagnosis Date Noted  . Small vessel disease, cerebrovascular 06/24/2016  . Left hemiparesis ([place])   . Thrombocytopenia ([place])   . Acute on chronic combined systolic and diastolic CHF (congestive heart failure) ([place])   . RBBB 06/21/2016  . CKD (chronic kidney disease), stage III 06/21/2016  . Basal ganglia infarction ([place]) 06/20/2016  . H/O cataract extraction 06/26/2014  . COLD (chronic obstructive lung disease) ([place]) 02/27/2014  . Essential hypertension 06/16/2012  . Diabetes mellitus type 2, controlled, with complications ([place]) 26/33/3545  . Hyperlipidemia associated with type 2 diabetes mellitus ([place]) 08/14/2008  . TOBACCO ABUSE 08/14/2008  . CAD, NATIVE VESSEL 08/14/2008  . Bradycardia 08/14/2008    Past Medical History:  Past Medical History:  Diagnosis Date  . ASHD (arteriosclerotic heart disease)    STENT  . COPD (chronic obstructive pulmonary disease) ([place])   . Diabetes mellitus    Hgb A1C- 5.8 in May 2017  . Hemorrhoids   . Hyperlipidemia   . Hypertension   . Smoker    Past Surgical History:  Past Surgical History:  Procedure Laterality Date  . INGUINAL HERNIA REPAIR Left 10/26/2015   Procedure: LEFT INGUINAL HERNIA REPAIR WITH MESH;  Surgeon: Jackolyn Confer, MD;  Location: [facility];  Service: General;  Laterality: Left;  . INSERTION OF MESH Left 10/26/2015   Procedure: INSERTION OF MESH;  Surgeon: Jackolyn Confer, MD;  Location: [facility];  Service: General;  Laterality: Left;  . NO PAST SURGERIES      Assessment & Plan Clinical Impression: Patient  is a 76 y.o.right handed malewith history of tobacco abuse/COPD, thrombocytopenia, arteriosclerotic heart disease with stenting maintained on aspirin and Plavix,diet controlleddiabetes mellitus, hyperlipidemia and hypertension. History taken from chart review. Patient lives alone and was independent prior to admission. One level home with 2 steps to entry.Closest family is brotherin [employer] to assist if needed .Presented 06/20/2016 for left-sided weakness and fall 2 without loss of consciousness. CT of the head showed a 9 mm hyperdense focus near the head of the right caudate no significant surrounding edema or mass effect. MRI reviewed, showing right caudate infarct. Per report, acute infarct of the right caudate tail extending inferiorly along the posterior limb of the right internal capsule. MRA unremarkable. Carotid Dopplers in no ICA stenosis. Echocardiogram with ejection fraction of 62% grade 2 diastolic dysfunction. Neurology consulted presently on aspirin and Plavix for CVA prophylaxis. Subcutaneous Lovenox for DVT prophylaxis. Tolerating a regular consistency diet..  Patient transferred to CIR on 06/24/2016 .   Patient currently requires mod with mobility secondary to muscle weakness, decreased cardiorespiratoy endurance, unbalanced muscle activation, decreased coordination and decreased motor planning and decreased sitting balance, decreased standing balance, decreased postural control, hemiplegia and decreased balance strategies.  Prior to hospitalization, patient was independent  with mobility and lived with   in a House home.  Home access is 2Stairs to enter.  Patient will benefit from skilled PT intervention to maximize safe functional mobility, minimize fall risk and decrease caregiver burden for planned discharge home with intermittent assist.  Anticipate patient will benefit from follow up [facility] at discharge.  PT - End of Session Activity Tolerance: Tolerates  30+ min activity with multiple rests Endurance Deficit: Yes PT Assessment Rehab Potential (ACUTE/IP ONLY): Excellent Barriers to Discharge: Decreased caregiver support;Inaccessible home environment PT Patient demonstrates impairments in the following area(s): Balance;Endurance;Motor;Safety PT Transfers Functional Problem(s): Bed Mobility;Bed to Chair;Car;Furniture;Floor PT Locomotion Functional Problem(s): Ambulation;Wheelchair Mobility;Stairs PT Plan PT Intensity: Minimum of 1-2 x/day ,45 to 90 minutes PT Frequency: 5 out of 7 days PT Duration Estimated Length of Stay: 14-16 days  PT Treatment/Interventions: Ambulation/gait training;Balance/vestibular training;Community reintegration;Discharge planning;DME/adaptive equipment instruction;Functional electrical stimulation;Functional mobility training;Disease management/prevention;Neuromuscular re-education;Patient/family education;Skin care/wound management;Stair training;Therapeutic Activities;Therapeutic Exercise;UE/LE Strength taining/ROM;Visual/perceptual remediation/compensation;UE/LE Coordination activities;Wheelchair propulsion/positioning PT Transfers Anticipated Outcome(s): Mod I with LRAD  PT Locomotion Anticipated Outcome(s): Mod I with LRAD  PT Recommendation Recommendations for Other Services: Therapeutic Recreation consult Follow Up Recommendations: Home health PT Patient destination: Home Equipment Recommended: Rolling walker with 5" wheels;Cane;To be determined  Skilled Therapeutic Intervention  Session 1 PT instructed patient in PT Evaluation and initiated treatment intervention; see below for results. PT educated patient in [place], rehab potential, rehab goals, and discharge recommendations. Gait training completed without AD as listed below and with RW x 1107f and min assist. Sit<>stand and stand pivot transfers completed with RW and Min assist for safety. Patient returned too room and [facility]  and all needs met.    Session 2.  Pt received sitting in WC and agreeable to PT PT instructed pt in Berg balance test. Patient demonstrates increased fall risk as noted by score of   39/56 on Berg Balance Scale.  (<36= high risk for falls, close to 100%; 37-45 significant >80%; 46-51 moderate >50%; 52-55 lower >25%). Pt was also instructed in DIG by PT 7/24. (<19 indicates increased fall risk.  nustep reciprocal movement training x 12 minutes with moderate cues for improved ROM and steps per minute throughout.  Gait training x 1529fwith RW and min assist overall. Mod assist x 2 due to anterior LOB. Cues for improved step through gait pattern and increased foot clearance on the LLE.  Pt attempted to urinate standing at toilet, required mod assist to prevent anterior LOB.   Patient returned too room and [facility] reach and all needs met.         PT Evaluation Precautions/Restrictions   General   Vital Signs Pain Pain Assessment Pain Assessment: No/denies pain Pain Score: 0-No pain Home Living/Prior Functioning Home Living Available Help at Discharge: Friend(s);Available PRN/intermittently Type of Home: House Home Access: Stairs to enter [employer] Steps: 2 Entrance Stairs-Rails: None Home Layout: One level Bathroom Shower/Tub: TuChiropodistStandard Bathroom Accessibility: Yes Additional Comments: loves airplanes Prior Function Level of Independence: Independent with basic ADLs;Independent with homemaking with ambulation;Independent with gait  Able to Take Stairs?: Yes Driving: Yes Vocation: Retired Comments: pt drives Vision/Perception     Cognition Overall Cognitive Status: Within Functional Limits for tasks assessed Orientation Level: Oriented X4 Memory: Appears intact Awareness: Appears intact Problem Solving: Appears intact Safety/Judgment: Appears intact Sensation Sensation Light Touch: Appears  Intact Proprioception: Appears Intact Coordination Gross Motor Movements are Fluid and Coordinated: No Fine Motor Movements are Fluid and Coordinated: No Coordination and Movement Description: Dysmetric Heel Shin Test: Decreased speed and accuracy on the LLE.  Motor  Motor Motor: Abnormal postural alignment and control;Hemiplegia Motor - Skilled Clinical Observations: L sided weakness and mild decrease in coordination, LLE>LUE  Mobility Bed Mobility  Bed Mobility: Rolling Right;Rolling Left;Supine to Sit;Sit to Supine Rolling Right: 5: Supervision Rolling Right Details: Verbal cues for technique;Verbal cues for precautions/safety Rolling Left: 5: Supervision Rolling Left Details: Verbal cues for technique;Verbal cues for precautions/safety Supine to Sit: 5: Supervision Supine to Sit Details: Verbal cues for technique;Verbal cues for precautions/safety Sit to Supine: 5: Supervision Sit to Supine - Details: Verbal cues for technique;Verbal cues for precautions/safety Transfers Transfers: Yes Sit to Stand: 4: Min assist Sit to Stand Details: Verbal cues for precautions/safety;Verbal cues for technique Stand Pivot Transfers: 3: Mod assist Stand Pivot Transfer Details: Verbal cues for technique;Verbal cues for precautions/safety Locomotion  Ambulation Ambulation: Yes Ambulation/Gait Assistance: 3: Mod assist Ambulation Distance (Feet): 85 Feet Assistive device: None Ambulation/Gait Assistance Details: Verbal cues for gait pattern;Verbal cues for precautions/safety;Verbal cues for technique Ambulation/Gait Assistance Details: inconsistent step through gait pattern and poor foot clearance on the L.  Gait Gait: Yes Gait Pattern: Impaired Gait Pattern: Step-to pattern;Step-through pattern;Narrow base of support;Poor foot clearance - left;Left foot flat Stairs / Additional Locomotion Stairs: Yes Stairs Assistance: 4: Min assist Stairs Assistance Details: Verbal cues for  technique;Verbal cues for precautions/safety;Verbal cues for gait pattern Stair Management Technique: Two rails Number of Stairs: 8 Height of Stairs: 6 Wheelchair Mobility Wheelchair Mobility: Yes Wheelchair Assistance: 4: Advertising account executive Details: Verbal cues for technique;Verbal cues for precautions/safety;Tactile cues for placement;Verbal cues for safe use of DME/AE Wheelchair Propulsion: Both upper extremities Wheelchair Parts Management: Needs assistance Distance: 181f   Trunk/Postural Assessment  Cervical Assessment Cervical Assessment: Exceptions to [facility](forward head) Thoracic Assessment Thoracic Assessment: Exceptions to [facility](rounded shoulder) Lumbar Assessment Lumbar Assessment: Exceptions to [facility](posterior pelvic titl) Postural Control Postural Control: Deficits on evaluation (delayed in standing)  Balance Balance Balance Assessed: Yes Standardized Balance Assessment Standardized Balance Assessment: Berg Balance Test;Dynamic Gait Index Berg Balance Test Sit to Stand: Able to stand  independently using hands Standing Unsupported: Able to stand safely 2 minutes Sitting with Back Unsupported but Feet Supported on Floor or Stool: Able to sit safely and securely 2 minutes Stand to Sit: Controls descent by using hands Transfers: Able to transfer safely, definite need of hands Standing Unsupported with Eyes Closed: Able to stand 10 seconds with supervision Standing Ubsupported with Feet Together: Able to place feet together independently and stand for 1 minute with supervision From Standing, Reach Forward with Outstretched Arm: Can reach confidently >25 cm (10") From Standing Position, Pick up Object from Floor: Able to pick up shoe, needs supervision From Standing Position, Turn to Look Behind Over each Shoulder: Looks behind one side only/other side shows less weight shift Turn 360 Degrees: Able to turn 360 degrees safely but slowly Standing Unsupported,  Alternately Place Feet on Step/Stool: Able to complete >2 steps/needs minimal assist Standing Unsupported, One Foot in Front: Able to take small step independently and hold 30 seconds Standing on One Leg: Tries to lift leg/unable to hold 3 seconds but remains standing independently Total Score: 39 Dynamic Gait Index Level Surface: Moderate Impairment Change in Gait Speed: Moderate Impairment Gait with Horizontal Head Turns: Moderate Impairment Gait with Vertical Head Turns: Severe Impairment Gait and Pivot Turn: Moderate Impairment Step Over Obstacle: Moderate Impairment Step Around Obstacles: Moderate Impairment Steps: Moderate Impairment Total Score: 7 Dynamic Sitting Balance Dynamic Sitting - Level of Assistance: 5: Stand by assistance Dynamic Sitting - Balance Activities: Reaching for objects Static Standing Balance Static Standing - Level of Assistance: 5: Stand by assistance Dynamic Standing Balance Dynamic Standing -  Balance Support: No upper extremity supported Dynamic Standing - Level of Assistance: 3: Mod assist Dynamic Standing - Balance Activities: Reaching for objects Extremity Assessment      RLE Assessment RLE Assessment: Within Functional Limits LLE Assessment LLE Assessment: Exceptions to [facility] LLE Strength LLE Overall Strength Comments: 4+/5 proximal to distal with delayed initiation   See Function Navigator for Current Functional Status.   Refer to Care Plan for Long Term Goals  Recommendations for other services: Therapeutic Recreation  Pet therapy, Kitchen group and Outing/community reintegration  Discharge Criteria: Patient will be discharged from PT if patient refuses treatment 3 consecutive times without medical reason, if treatment goals not met, if there is a change in medical status, if patient makes no progress towards goals or if patient is discharged from hospital.  The above assessment, treatment plan, treatment alternatives and goals were  discussed and mutually agreed upon: by patient  [name] 06/25/2016, 11:35 AM

## 2016-06-25 NOTE — Progress Notes (Signed)
Patient information reviewed and entered into eRehab system by Roper Tolson, RN, CRRN, PPS Coordinator.  Information including medical coding and functional independence measure will be reviewed and updated through discharge.     Per nursing patient was given "Data Collection Information Summary for Patients in Inpatient Rehabilitation Facilities with attached "Privacy Act Statement-Health Care Records" upon admission.  

## 2016-06-25 NOTE — Progress Notes (Signed)
 Subjective/Complaints: C/o poor design of tray table.  No pains Discussed with his private cardiologist Dr Ross  ROS-  Neg N/V/D.  No CP or SOB  Objective: Vital Signs: Blood pressure (!) 158/66, pulse (!) 48, temperature 98 F (36.7 C), temperature source Oral, resp. rate 16, weight 67.4 kg (148 lb 9.6 oz), SpO2 95 %. No results found. Results for orders placed or performed during the hospital encounter of 06/20/16 (from the past 72 hour(s))  Comprehensive metabolic panel     Status: Abnormal   Collection Time: 06/23/16  5:48 AM  Result Value Ref Range   Sodium 139 135 - 145 mmol/L   Potassium 3.6 3.5 - 5.1 mmol/L   Chloride 107 101 - 111 mmol/L   CO2 26 22 - 32 mmol/L   Glucose, Bld 92 65 - 99 mg/dL   BUN 15 6 - 20 mg/dL   Creatinine, Ser 1.25 (H) 0.61 - 1.24 mg/dL   Calcium 8.6 (L) 8.9 - 10.3 mg/dL   Total Protein 5.8 (L) 6.5 - 8.1 g/dL   Albumin 3.1 (L) 3.5 - 5.0 g/dL   AST 14 (L) 15 - 41 U/L   ALT 11 (L) 17 - 63 U/L   Alkaline Phosphatase 65 38 - 126 U/L   Total Bilirubin 0.6 0.3 - 1.2 mg/dL   GFR calc non Af Amer 55 (L) >60 mL/min   GFR calc Af Amer >60 >60 mL/min    Comment: (NOTE) The eGFR has been calculated using the CKD EPI equation. This calculation has not been validated in all clinical situations. eGFR's persistently <60 mL/min signify possible Chronic Kidney Disease.    Anion gap 6 5 - 15  CBC     Status: Abnormal   Collection Time: 06/23/16  5:48 AM  Result Value Ref Range   WBC 7.0 4.0 - 10.5 K/uL   RBC 4.69 4.22 - 5.81 MIL/uL   Hemoglobin 13.9 13.0 - 17.0 g/dL   HCT 41.8 39.0 - 52.0 %   MCV 89.1 78.0 - 100.0 fL   MCH 29.6 26.0 - 34.0 pg   MCHC 33.3 30.0 - 36.0 g/dL   RDW 14.2 11.5 - 15.5 %   Platelets 106 (L) 150 - 400 K/uL    Comment: CONSISTENT WITH PREVIOUS RESULT     HEENT: facial eczema Cardio: irreg irreg no murmur Resp: CTA B/L and unlabored GI: BS positive and NT, ND Extremity:  No Edema Skin:   Rash dry erythemous plaques   Neuro: Alert/Oriented, Normal Sensory, Abnormal Motor visual fields intact, Abnormal FMC Ataxic/ dec FMC and Other no evidence of neglect Musc/Skel:  Other no pain with UE or LE ROM Gen NAD   Assessment/Plan: 1. Functional deficits secondary to RIght Caudate infarct which require 3+ hours per day of interdisciplinary therapy in a comprehensive inpatient rehab setting. Physiatrist is providing close team supervision and 24 hour management of active medical problems listed below. Physiatrist and rehab team continue to assess barriers to discharge/monitor patient progress toward functional and medical goals. FIM:       Function - Toileting Toileting steps completed by helper: Adjust clothing prior to toileting, Performs perineal hygiene, Adjust clothing after toileting Assist level: Touching or steadying assistance (Pt.75%)  Function - Toilet Transfers Assist level to toilet: Moderate assist (Pt 50 - 74%/lift or lower) Assist level from toilet: Moderate assist (Pt 50 - 74%/lift or lower)  Function - Chair/bed transfer Chair/bed transfer method: Stand pivot Chair/bed transfer assist level: Touching or steadying assistance (Pt >   75%) Chair/bed transfer assistive device: Bedrails Chair/bed transfer details: Tactile cues for initiation, Tactile cues for sequencing, Tactile cues for placement, Tactile cues for weight bearing     Function - Comprehension Comprehension: Auditory Comprehension assistive device: Hearing aids Comprehension assist level: Follows complex conversation/direction with extra time/assistive device  Function - Expression Expression: Verbal Expression assist level: Expresses complex ideas: With extra time/assistive device  Function - Social Interaction Social Interaction assist level: Interacts appropriately with others with medication or extra time (anti-anxiety, antidepressant).  Function - Problem Solving Problem solving assist level: Solves complex 90% of  the time/cues < 10% of the time  Function - Memory Memory assist level: Complete Independence: No helper Patient normally able to recall (first 3 days only): That he or she is in a hospital   Medical Problem List and Plan: 1. Left-sided weaknesssecondary to Acute right caudate infarct likely secondary to small vessel disease -CIR PT, OT, SLP evals -Parkinsonian features? 2. DVT Prophylaxis/Anticoagulation: Subcutaneous Lovenox. Monitor platelet counts of any signs of bleeding 3. Pain Management: Tylenol as needed 4. Mood: Provide emotional support 5. Neuropsych: This patient iscapable of making decisions on hisown behalf. 6. Skin/Wound Care: Routine skin checks 7. Fluids/Electrolytes/Nutrition: Routine I&O with follow-up chemistries 8.Arteriosclerotic heart disease with stenting. Continue aspirin and Plavix 9.Diet-controlled diabetes mellitus. Hemoglobin A1c 5.8. CBG checks discontinued 10.History of hypertension. Permissive hypertension. Resume lisinopril 10 mg daily as needed 11.Tobacco abuse. Nicoderm patch. Provide counseling 12.Hyperlipidemia. Crestor 13.History of Thrombocytopenia. 117,000. Follow-up plt stable at 106K 14. Urinary frequency, malodorous urine: check ua and ucx 15.  Hypoalbuminemia- prostat 16.  Hx CAD s/p stent on chronic ASA and Plavix but had small vessel infarct nevertheless, heavy smoker, discussed with cardiology LOS (Days) 1 A FACE TO FACE EVALUATION WAS PERFORMED  KIRSTEINS,ANDREW E 06/25/2016, 8:10 AM   

## 2016-06-25 NOTE — Progress Notes (Signed)
Social Work Assessment and Plan Social Work Assessment and Plan  Patient Details  Name: Donald Richardson MRN: [id number] Date of Birth: 01-Mar-1941  Today's Date: 06/25/2016  Problem List:  Patient Active Problem List   Diagnosis Date Noted  . Small vessel disease, cerebrovascular 06/24/2016  . Left hemiparesis ([place])   . Thrombocytopenia ([place])   . Acute on chronic combined systolic and diastolic CHF (congestive heart failure) ([place])   . RBBB 06/21/2016  . CKD (chronic kidney disease), stage III 06/21/2016  . Basal ganglia infarction ([place]) 06/20/2016  . H/O cataract extraction 06/26/2014  . COLD (chronic obstructive lung disease) ([place]) 02/27/2014  . Essential hypertension 06/16/2012  . Diabetes mellitus type 2, controlled, with complications ([place]) 38/25/0539  . Hyperlipidemia associated with type 2 diabetes mellitus ([place]) 08/14/2008  . TOBACCO ABUSE 08/14/2008  . CAD, NATIVE VESSEL 08/14/2008  . Bradycardia 08/14/2008   Past Medical History:  Past Medical History:  Diagnosis Date  . ASHD (arteriosclerotic heart disease)    STENT  . COPD (chronic obstructive pulmonary disease) ([place])   . Diabetes mellitus    Hgb A1C- 5.8 in May 2017  . Hemorrhoids   . Hyperlipidemia   . Hypertension   . Smoker    Past Surgical History:  Past Surgical History:  Procedure Laterality Date  . INGUINAL HERNIA REPAIR Left 10/26/2015   Procedure: LEFT INGUINAL HERNIA REPAIR WITH MESH;  Surgeon: Jackolyn Confer, MD;  Location: [facility];  Service: General;  Laterality: Left;  . INSERTION OF MESH Left 10/26/2015   Procedure: INSERTION OF MESH;  Surgeon: Jackolyn Confer, MD;  Location: [facility];  Service: General;  Laterality: Left;  . NO PAST SURGERIES     Social History:  reports that he has been smoking Cigarettes.  He has been smoking about 1.00 pack per day. He has never used smokeless tobacco. He reports that he does not drink alcohol or use drugs.  Family / Support Systems Marital Status:  Single Patient Roles: Other (Comment) (sibling and neighbors) Other Supports: [name]-brother [phone] Anticipated Caregiver: brother and friends  Ability/Limitations of Caregiver: brother lives in [place] and friends can only provide intermittent checks Caregiver Availability: Intermittent Family Dynamics: Pt is close with his brother and see's him as much as he can. He has friends and neighbors who will stop by and check on him and make sure he has what he needs.  Social History Preferred language: English Religion: Quaker Cultural Background: No issues Education: Sports coach Read: Yes Write: Yes Employment Status: Retired Freight forwarder Issues: No issues Guardian/Conservator: None-according to MD pt is capable of making his own decisions while here.   Abuse/Neglect Physical Abuse: Denies Verbal Abuse: Denies Sexual Abuse: Denies Exploitation of patient/patient's resources: Denies Self-Neglect: Denies  Emotional Status Pt's affect, behavior adn adjustment status: Pt is motivated to do well and recover from this stroke. He feels he is doing well and will get to the level where he needs to get to go home. He plans to go home no matter what anyway. MD to talk with pt about driivng when he leaves here. Recent Psychosocial Issues: other health issues felt were managed prior to this Pyschiatric History: No history feels he is doing well and is one of those realists, what will happen will happen. He feels he can get back to where he was and this is encouraging to him. Will monitor and se eif team feels he would benefit from seeing neuro-psych while here. Substance Abuse History: Tobacco aware needs to  quit smoking but he enjoys it so  Patient / Family Perceptions, Expectations & Goals Pt/Family understanding of illness & functional limitations: Pt and brother are able to explain his stroke and deficits, he is getting better and feels good aobut this,  but it is not fast enough. He does talk with the MD and is aware of his treatment plan. Premorbid pt/family roles/activities: Brother, friend, neighbor, church member, Health visitor, etc Anticipated changes in roles/activities/participation: resume Pt/family expectations/goals: Pt states: " I plan to be able to do all of the things I did prior to this stroke."  Brother states; " He is stubborn and will get there, you don't know him."  [employer]: None Premorbid Home Care/DME Agencies: None Transportation available at discharge: Friends and neighbors Resource referrals recommended: Support group (specify)  Discharge Planning Living Arrangements: Alone Support Systems: Other relatives, Water engineer, Social worker community Type of Residence: Private residence Insurance underwriter Resources: [employer], Multimedia programmer (specify) ([place]) Museum/gallery curator Resources: Radio broadcast assistant Screen Referred: No Living Expenses: Own Money Management: Patient Does the patient have any problems obtaining your medications?: No Home Management: Self Patient/Family Preliminary Plans: Return home with friends and neighbors coming by intermittently to make sure he has what he needs. He needs to be mod/i to be able to be safe at home and take care of his needs. He is doing well today for his first day. Social Work Anticipated Follow Up Needs: HH/OP, Support Group  Clinical Impression Pleasant head strong gentleman who has always been independent and will not quit now. He is motivated to do well and recover from this stroke. His brother is here to check on him and talk with the team. He will have intermittent Assist at discharge. Will await therapy team evaluation's and work on a safe discharge plan. MD to speak with about not driving when discharged from here.  [name] 06/25/2016, 3:27 PM

## 2016-06-25 NOTE — Care Management Note (Signed)
Inpatient Rehabilitation Center Individual Statement of Services  Patient Name:  Donald Richardson  Date:  06/25/2016  Welcome to the [facility].  Our goal is to provide you with an individualized program based on your diagnosis and situation, designed to meet your specific needs.  With this comprehensive rehabilitation program, you will be expected to participate in at least 3 hours of rehabilitation therapies Monday-Friday, with modified therapy programming on the weekends.  Your rehabilitation program will include the following services:  Physical Therapy (PT), Occupational Therapy (OT), Speech Therapy (ST), 24 hour per day rehabilitation nursing, Therapeutic Recreaction (TR), Neuropsychology, Case Management (Social Worker), Rehabilitation Medicine, Nutrition Services and Pharmacy Services  Weekly team conferences will be held on Wednesday to discuss your progress.  Your Social Worker will talk with you frequently to get your input and to update you on team discussions.  Team conferences with you and your family in attendance may also be held.  Expected length of stay: 2 weeks  Overall anticipated outcome: mod/I level  Depending on your progress and recovery, your program may change. Your Social Worker will coordinate services and will keep you informed of any changes. Your Social Worker's name and contact numbers are listed  below.  The following services may also be recommended but are not provided by the [facility] will be made to provide these services after discharge if needed.  Arrangements include referral to agencies that provide these services.  Your insurance has been verified to be:  [place] primary doctor is:  Jill Alexanders  Pertinent information will be shared with your doctor and your insurance  company.  Social Worker:  Ovidio Kin, [place] or ([phone]  Information discussed with and copy given to patient by: [name], 06/25/2016, 3:09 PM

## 2016-06-26 ENCOUNTER — Inpatient Hospital Stay (HOSPITAL_COMMUNITY): Payer: Medicare Other | Admitting: Physical Therapy

## 2016-06-26 ENCOUNTER — Inpatient Hospital Stay (HOSPITAL_COMMUNITY): Payer: Medicare Other | Admitting: Occupational Therapy

## 2016-06-26 DIAGNOSIS — R269 Unspecified abnormalities of gait and mobility: Secondary | ICD-10-CM

## 2016-06-26 DIAGNOSIS — I69398 Other sequelae of cerebral infarction: Secondary | ICD-10-CM

## 2016-06-26 NOTE — Progress Notes (Signed)
Subjective/Complaints: Slept ok, denies bowel or bladder issues  ROS-  Neg N/V/D.  No CP or SOB  Objective: Vital Signs: Blood pressure (!) 148/63, pulse (!) 56, temperature 97.8 F (36.6 C), temperature source Oral, resp. rate 18, weight 67.4 kg (148 lb 9.6 oz), SpO2 96 %. No results found. Results for orders placed or performed during the hospital encounter of 06/24/16 (from the past 72 hour(s))  CBC WITH DIFFERENTIAL     Status: Abnormal   Collection Time: 06/25/16  8:06 AM  Result Value Ref Range   WBC 7.4 4.0 - 10.5 K/uL   RBC 4.94 4.22 - 5.81 MIL/uL   Hemoglobin 14.8 13.0 - 17.0 g/dL   HCT 44.9 39.0 - 52.0 %   MCV 90.9 78.0 - 100.0 fL   MCH 30.0 26.0 - 34.0 pg   MCHC 33.0 30.0 - 36.0 g/dL   RDW 14.5 11.5 - 15.5 %   Platelets 109 (L) 150 - 400 K/uL    Comment: REPEATED TO VERIFY CONSISTENT WITH PREVIOUS RESULT    Neutrophils Relative % 73 %   Neutro Abs 5.4 1.7 - 7.7 K/uL   Lymphocytes Relative 14 %   Lymphs Abs 1.1 0.7 - 4.0 K/uL   Monocytes Relative 9 %   Monocytes Absolute 0.7 0.1 - 1.0 K/uL   Eosinophils Relative 4 %   Eosinophils Absolute 0.3 0.0 - 0.7 K/uL   Basophils Relative 0 %   Basophils Absolute 0.0 0.0 - 0.1 K/uL  Comprehensive metabolic panel     Status: Abnormal   Collection Time: 06/25/16  8:06 AM  Result Value Ref Range   Sodium 139 135 - 145 mmol/L   Potassium 4.1 3.5 - 5.1 mmol/L   Chloride 105 101 - 111 mmol/L   CO2 27 22 - 32 mmol/L   Glucose, Bld 129 (H) 65 - 99 mg/dL   BUN 20 6 - 20 mg/dL   Creatinine, Ser 1.40 (H) 0.61 - 1.24 mg/dL   Calcium 8.6 (L) 8.9 - 10.3 mg/dL   Total Protein 6.1 (L) 6.5 - 8.1 g/dL   Albumin 3.1 (L) 3.5 - 5.0 g/dL   AST 19 15 - 41 U/L   ALT 17 17 - 63 U/L   Alkaline Phosphatase 71 38 - 126 U/L   Total Bilirubin 0.8 0.3 - 1.2 mg/dL   GFR calc non Af Amer 48 (L) >60 mL/min   GFR calc Af Amer 56 (L) >60 mL/min    Comment: (NOTE) The eGFR has been calculated using the CKD EPI equation. This calculation has  not been validated in all clinical situations. eGFR's persistently <60 mL/min signify possible Chronic Kidney Disease.    Anion gap 7 5 - 15     HEENT: facial eczema Cardio: irreg irreg no murmur Resp: CTA B/L and unlabored GI: BS positive and NT, ND Extremity:  No Edema Skin:   Rash dry erythemous plaques  Neuro: Alert/Oriented, Normal Sensory, Abnormal Motor visual fields intact, Abnormal FMC Ataxic/ dec FMC and Other no evidence of neglect Musc/Skel:  Other no pain with UE or LE ROM Gen NAD   Assessment/Plan: 1. Functional deficits secondary to RIght Caudate infarct which require 3+ hours per day of interdisciplinary therapy in a comprehensive inpatient rehab setting. Physiatrist is providing close team supervision and 24 hour management of active medical problems listed below. Physiatrist and rehab team continue to assess barriers to discharge/monitor patient progress toward functional and medical goals. FIM: Function - Bathing Position: Shower Body parts bathed  by patient: Right arm, Left upper leg, Left arm, Right lower leg, Chest, Abdomen, Left lower leg, Front perineal area, Buttocks, Right upper leg Body parts bathed by helper: Back Assist Level: Touching or steadying assistance(Pt > 75%)  Function- Upper Body Dressing/Undressing What is the patient wearing?: Pull over shirt/dress Pull over shirt/dress - Perfomed by patient: Thread/unthread right sleeve, Thread/unthread left sleeve, Put head through opening Pull over shirt/dress - Perfomed by helper: Pull shirt over trunk Function - Lower Body Dressing/Undressing What is the patient wearing?: Pants, Non-skid slipper socks, Underwear Position: Sitting EOB Underwear - Performed by patient: Thread/unthread right underwear leg, Thread/unthread left underwear leg, Pull underwear up/down Pants- Performed by patient: Thread/unthread right pants leg, Thread/unthread left pants leg, Pull pants up/down Pants- Performed by helper:  Fasten/unfasten pants Non-skid slipper socks- Performed by helper: Don/doff right sock, Don/doff left sock Assist for footwear: Maximal assist Assist for lower body dressing: Touching or steadying assistance (Pt > 75%)  Function - Toileting Toileting steps completed by patient: Adjust clothing prior to toileting, Performs perineal hygiene, Adjust clothing after toileting Toileting steps completed by helper: Adjust clothing prior to toileting, Performs perineal hygiene, Adjust clothing after toileting Assist level: Supervision or verbal cues  Function - Toilet Transfers Toilet transfer assistive device: Grab bar, Walker Assist level to toilet: Supervision or verbal cues Assist level from toilet: Supervision or verbal cues  Function - Chair/bed transfer Chair/bed transfer method: Stand pivot Chair/bed transfer assist level: Supervision or verbal cues Chair/bed transfer assistive device: Walker, Armrests Chair/bed transfer details: Tactile cues for initiation, Tactile cues for sequencing, Tactile cues for placement, Tactile cues for weight bearing  Function - Locomotion: Wheelchair Will patient use wheelchair at discharge?: No Type: Manual Max wheelchair distance: 138f  Assist Level: Touching or steadying assistance (Pt > 75%) Assist Level: Touching or steadying assistance (Pt > 75%) Wheel 150 feet activity did not occur: Refused Turns around,maneuvers to table,bed, and toilet,negotiates 3% grade,maneuvers on rugs and over doorsills: No Function - Locomotion: Ambulation Assistive device: No device Max distance: 873f Assist level: Moderate assist (Pt 50 - 74%) Assist level: Moderate assist (Pt 50 - 74%) Assist level: Moderate assist (Pt 50 - 74%) Walk 150 feet activity did not occur: Safety/medical concerns Walk 10 feet on uneven surfaces activity did not occur:  (with RW ) Assist level: Moderate assist (Pt 50 - 74%)  Function - Comprehension Comprehension:  Auditory Comprehension assistive device: Hearing aids Comprehension assist level: Follows complex conversation/direction with extra time/assistive device  Function - Expression Expression: Verbal Expression assist level: Expresses complex ideas: With extra time/assistive device  Function - Social Interaction Social Interaction assist level: Interacts appropriately with others with medication or extra time (anti-anxiety, antidepressant).  Function - Problem Solving Problem solving assist level: Solves complex 90% of the time/cues < 10% of the time  Function - Memory Memory assist level: Complete Independence: No helper Patient normally able to recall (first 3 days only): Current season, Staff names and faces, That he or she is in a hospital   Medical Problem List and Plan: 1. Left-sided weaknesssecondary to Acute right caudate infarct likely secondary to small vessel disease -CIR PT, OT, SLP, cont rehab -Parkinsonian features? 2. DVT Prophylaxis/Anticoagulation: Subcutaneous Lovenox. Monitor platelet counts of any signs of bleeding 3. Pain Management: Tylenol as needed 4. Mood: Provide emotional support 5. Neuropsych: This patient iscapable of making decisions on hisown behalf. 6. Skin/Wound Care: Routine skin checks 7. Fluids/Electrolytes/Nutrition: Routine I&O, normal BUN, creat mildly elevated 8.Arteriosclerotic heart disease  with stenting. Continue aspirin and Plavix 9.Diet-controlled diabetes mellitus. Hemoglobin A1c 5.8. CBG checks discontinued 10.History of hypertension. Permissive hypertension. Resume lisinopril 10 mg daily as needed 11.Tobacco abuse. Nicoderm patch. Provide counseling 12.Hyperlipidemia. Crestor 13.History of Thrombocytopenia. 117,000. Follow-up plt stable at 106K  15.  Hypoalbuminemia- prostat 16.  Hx CAD s/p stent on chronic ASA and Plavix but had small vessel infarct nevertheless, heavy smoker, discussed with  cardiology 17.  CKD, no nephrotoxic meds, will monitor LOS (Days) 2 A FACE TO FACE EVALUATION WAS PERFORMED  Donald Richardson 06/26/2016, 7:24 AM

## 2016-06-26 NOTE — Progress Notes (Signed)
Occupational Therapy Session Note  Patient Details  Name: Donald Richardson MRN: [id number] Date of Birth: [birth year]  Today's Date: 06/26/2016 OT Individual Time: 1100-1200 and 1440-1500 OT Individual Time Calculation (min): 60 min and 40 min   Short Term Goals: Week 1:  OT Short Term Goal 1 (Week 1): Pt will complete toileting task with supervision OT Short Term Goal 2 (Week 1): Pt will complete full dressing task with superviison/ set-up assist OT Short Term Goal 3 (Week 1): Will initiate IADL re-training OT Short Term Goal 4 (Week 1): Pt will ambulate into bathroom in prep for showering/ toileting tasks with CGA  Skilled Therapeutic Interventions/Progress Updates:    Session One: Pt seen for OT ADL bathing/dressing session. Pt in supine upon arrival, easily awoken and agreeable to tx session. With increased time, he came to sitting EOB with min A. He ambulated throughout session with CGA, min cuing for RW management in functional environment.  He bathed seated on tub transfer bench. He required min-mod A for sit<> stand throughout session depending on surface height and VCs for hand placement on RW. He dressed seated on toilet with increased time. Required increased assist for threading socks over feet due to extremely long toenails. RN made aware for recommendation for poditrist to assist with cutting toenails. Pt returned to w/c at end of session, left seated in w/c with all needs in reach, made aware of need to use call bell for assist with mobility.   Session Two: Pt seen for OT session focusing on functional ambulation and transfers. Pt sitting up in w/c upon arrival, voicing increased fatigue, however, desiring to work on walking.  He ambulated to ADL apartment with CGA and mod cuing for decreasing space btwn him and walker and for L LE clearance. Following extended seated rest break, pt able to complete sit> stand from low soft surface chair with VCs for technique. He completed simulated  tub/shower transfer utilizing tub bench in simulation of home environment. Completed with supervision following demonstration for technique with increased time to manage LEs over tub wall. He then ambulated to toilet and completed standing voiding with steadying assist.  He returned to room at end of session in same manner as described above. Pt left seated in w/c at end of session, all needs in reach.   Therapy Documentation Precautions:  Precautions Precautions: Fall Restrictions Weight Bearing Restrictions: No Pain:   No/ denies pain  See Function Navigator for Current Functional Status.   Therapy/Group: Individual Therapy  Lewis, Mitzy Naron C 06/26/2016, 7:20 AM

## 2016-06-26 NOTE — Progress Notes (Signed)
Physical Therapy Session Note  Patient Details  Name: Donald Richardson MRN: [id number] Date of Birth: November 02, 1940  Today's Date: 06/26/2016 PT Individual Time: 9562-1308 AND 1300-1345 PT Individual Time Calculation (min): 45 min AND 45 min   Short Term Goals: Week 1:  PT Short Term Goal 1 (Week 1): Pt will perform bed mobility without cues or assist from PT PT Short Term Goal 2 (Week 1): Pt will perform sit<>stand transfers with supervision assist consistently. PT Short Term Goal 3 (Week 1): Pt will perform stand pivot transfers with supervision assist with LRAD consistently PT Short Term Goal 4 (Week 1): Pt will ambulate 163f with supervision assist and LRAD    Skilled Therapeutic Interventions/Progress Updates:   Pt received semirecumbent in bed and agreeable to PT. Supine>sit transfer with min assist from PT and moderate cues for proper UE use on bed rails. Pt noted to have constant LOB posteriorly with difficulty correcting. Sitting balance EOB to don socks and shoes. Min assist progressing to supervision assist due to posterior LOB and delayed righting reactions through the trunk. PT provided set up assist for sock and shoe management.   Gait training through hall x 1235fwith sueprvision assist progressing to min assist due to increasing forward trunk lean and decreased control of RW as pt increased distance.   standin balance training on 3 wedge. Anteriorly and posteriorly angled. 2x 1 minute in each direction. Pt noted to have increased difficulty preventing anterior lOB compared to posterior. Min-mod assist throughout with no evidence of hip or ankle strategy to prevent anterior LOB.   Patient returned too room and [facility] and all needs met.       Session 2  Pt received sitting in WC and agreeable to PT  WC mobility [id number] supervision assist from PT and moderate cues for increased use of LUE to prevent veer to the L.   PT instructed in  Variable gait training in parallel bars forward and retogait, 4 x 10 each direction, and site stepping L and R [id number] direction. PT provided supervision assist throughout with min cues for improved gait pattern and foot clearance.   Dynamic balance training including mini forward and lateral lunges over a 1 inch cane x 10 BLE in each direction. Min assist throughout from PT for safety and facilitate improved posture.    gait training instructed by PT without AD x 140ft84fn assist progressing to mod assist after 40ft.27fstant cues for upright posture and increased step height and width on the LLE. Pt self selected to stop every 10ft t84frrect posture for last 75 ft.   Patient returned too room and left sitting in [facility] bell in reach and all needs met.         Therapy Documentation Precautions:  Precautions Precautions: Fall Restrictions Weight Bearing Restrictions: No  Pain: 0/10   See Function Navigator for Current Functional Status.   Therapy/Group: Individual Therapy  Ashleigh Arya [name]018, 11:38 AM

## 2016-06-26 NOTE — Progress Notes (Signed)
Social Work [name], LCSW Social Worker Signed   Patient Care Conference Date of Service: 06/25/2016  1:42 PM      Hide copied text Hover for attribution information Inpatient RehabilitationTeam Conference and Plan of Care Update Date: 06/25/2016   Time: 11:00 AM      Patient Name: Donald Richardson      Medical Record Number: [id number]  Date of Birth: 1940/10/20 Sex: Male         Room/Bed: 4M06C/4M06C-01 Payor Info: Payor: MEDICARE / Plan: MEDICARE PART A AND B / Product Type: *No Product type* /     Admitting Diagnosis: CVA  Admit Date/Time:  06/24/2016  6:04 PM Admission Comments: No comment available    Primary Diagnosis:  <principal problem not specified> Principal Problem: <principal problem not specified>       Patient Active Problem List    Diagnosis Date Noted  . Small vessel disease, cerebrovascular 06/24/2016  . Left hemiparesis ([place])    . Thrombocytopenia ([place])    . Acute on chronic combined systolic and diastolic CHF (congestive heart failure) ([place])    . RBBB 06/21/2016  . CKD (chronic kidney disease), stage III 06/21/2016  . Basal ganglia infarction ([place]) 06/20/2016  . H/O cataract extraction 06/26/2014  . COLD (chronic obstructive lung disease) ([place]) 02/27/2014  . Essential hypertension 06/16/2012  . Diabetes mellitus type 2, controlled, with complications ([place]) 57/84/6962  . Hyperlipidemia associated with type 2 diabetes mellitus ([place]) 08/14/2008  . TOBACCO ABUSE 08/14/2008  . CAD, NATIVE VESSEL 08/14/2008  . Bradycardia 08/14/2008      Expected Discharge Date: Expected Discharge Date: 07/09/16   Team Members Present: Physician leading conference: Dr. Alysia Penna Social Worker Present: Ovidio Kin, LCSW Nurse Present: Heather Roberts, RN PT Present: Phylliss Bob, PTA;Barrie Folk, PT OT Present: Napoleon Form, OT SLP Present: Stormy Fabian, SLP PPS Coordinator present : Daiva Nakayama, RN, CRRN       Current Status/Progress Goal Weekly Team Focus    Medical     chronic smoker, cont bowel and bladder  Mod i functional goals maintain med stability  initiate rehab program   Bowel/Bladder     continent of bowel & bladder, LBM 06/24/16  continue to be continent  continue to monitor & assist as needed   Swallow/Nutrition/ Hydration               ADL's     min-mod A overall  Mod I overall  ADL re-training; functional activity tolerance/ balance; functional transfers; safety   Mobility     Mod assist overall for transfers and gait without AD. min assist for WC mobility   Modified independent with all mobility including bed mobility, transfers, gait, stairs  endurance, L neuromotor control, balance, gait, safety with transfers.    Communication               Safety/Cognition/ Behavioral Observations             Pain     no c/o pain, has tylenol ordered prn  pain scale <3  continue to assess & treat as needed   Skin     multiple skin tags, spots, old lesion scars, loop recorder incision to left chest with gauze drsg, toenails are thick, mycotic & curling, no skin breakdown  no new areas of skin breakdown  assess q shift     *See Care Plan and progress notes for long and short-term goals.   Barriers to Discharge: needs to be Mod I  Possible Resolutions to Barriers:  cont rehab     Discharge Planning/Teaching Needs:    Home with intermittent assist from friends and neighbors. Needs to be mod/I level to return home safely.     Team Discussion:  Goals mod/I level, currently min assist and being evaluated by therapy team. Leans forward due to balance issues, will work on this while here. Motor planning issues. Brother here from [place]  Revisions to Treatment Plan:  New evaluation DC target 5/9    Continued Need for Acute Rehabilitation Level of Care: The patient requires daily medical management by a physician with specialized training in physical medicine and rehabilitation for the following conditions: Daily direction  of a multidisciplinary physical rehabilitation program to ensure safe treatment while eliciting the highest outcome that is of practical value to the patient.: Yes Daily medical management of patient stability for increased activity during participation in an intensive rehabilitation regime.: Yes Daily analysis of laboratory values and/or radiology reports with any subsequent need for medication adjustment of medical intervention for : Neurological problems   [name] 06/26/2016, 3:47 PM       Patient ID: Donald Richardson, male   DOB: 1940-12-13, 75 y.o.   MRN: [id number]

## 2016-06-27 ENCOUNTER — Inpatient Hospital Stay (HOSPITAL_COMMUNITY): Payer: Medicare Other | Admitting: Physical Therapy

## 2016-06-27 ENCOUNTER — Inpatient Hospital Stay (HOSPITAL_COMMUNITY): Payer: Medicare Other

## 2016-06-27 NOTE — Progress Notes (Signed)
Occupational Therapy Session Note  Patient Details  Name: Donald Richardson MRN: [id number] Date of Birth: 1940/08/30  Today's Date: 06/27/2016 OT Individual Time: 5189-8421 and 1300-1357 OT Individual Time Calculation (min): 72 min and 57 min   Short Term Goals: Week 1:  OT Short Term Goal 1 (Week 1): Pt will complete toileting task with supervision OT Short Term Goal 2 (Week 1): Pt will complete full dressing task with superviison/ set-up assist OT Short Term Goal 3 (Week 1): Will initiate IADL re-training OT Short Term Goal 4 (Week 1): Pt will ambulate into bathroom in prep for showering/ toileting tasks with CGA  Skilled Therapeutic Interventions/Progress Updates:    Session 1: 1:1. Focus on ADL retraining at shower level. Pt ambulates into bathroom with RW in CGA and VC for RW management. Pt stands over toilet with VC to walk RW over toilet  To use for balance while voiding. Pt ambulates and transfers onto TTB with CGA and Vc for sequencing. Pt bathes 10/10 body parts with supervision and Vc for safe use of grab bars during sit to stand transition with CGA to wash buttocks/peri area. Pt dons UB/LB clothing and footwear seated EOB with supervision and VC for sitting to thread BLE into pants to decrease fall risk, elevate foot on trashcan to don B footwear and use hemi dressing to thread weaker LLE into pants. Pt stands at sink to brush teeth with VC to locate toothbrush in caddy on L side of sink and VC for posture. Exited session with pt seated in w/c with call light in reach and all needs met  Session 2: 1:1. Focus on standing balance, endurance and functional mobility in context of grooming/laundry. Pt stands for 15 min at sink to wash face, apply shaving cream and shave with razor with Vc for safety awareness, stabilizing LUE on sink and posture. Pt demo improved wit to stand transition this session needing no steadying balance throughout treatment. Pt stands and loads laundry into washer with  VC for safe reaching into laundry bag onto floor with VC to use washer to stabilize intead of walker. Pt ambulates from all therapeutic destinations with VC for L foot clearance, keeping feet inside walker and RW management for turning. Pt completes ambulatory stand pivot transfer from couch with touching A to balance while standing from low surface. Pt completes ambulatory tub transfer onto TTB with RW with VC for sequencing and RW management. Pt able to lift LLE over tub ledge this date without use of UE. Exited session with pt seated in w/c with call light in reach and all needs met.  Therapy Documentation Precautions:  Precautions Precautions: Fall Restrictions Weight Bearing Restrictions: No General:   Vital Signs:  Pain: Pain Assessment Pain Score: 0-No pain  See Function Navigator for Current Functional Status.   Therapy/Group: Individual Therapy  Tonny Branch 06/27/2016, 12:10 PM

## 2016-06-27 NOTE — Progress Notes (Signed)
 Subjective/Complaints: Slept well, therapy went well  ROS-  Neg N/V/D.  No CP or SOB  Objective: Vital Signs: Blood pressure 122/61, pulse (!) 48, temperature 97.9 F (36.6 C), temperature source Oral, resp. rate 16, weight 67.4 kg (148 lb 9.6 oz), SpO2 97 %. No results found. Results for orders placed or performed during the hospital encounter of 06/24/16 (from the past 72 hour(s))  CBC WITH DIFFERENTIAL     Status: Abnormal   Collection Time: 06/25/16  8:06 AM  Result Value Ref Range   WBC 7.4 4.0 - 10.5 K/uL   RBC 4.94 4.22 - 5.81 MIL/uL   Hemoglobin 14.8 13.0 - 17.0 g/dL   HCT 44.9 39.0 - 52.0 %   MCV 90.9 78.0 - 100.0 fL   MCH 30.0 26.0 - 34.0 pg   MCHC 33.0 30.0 - 36.0 g/dL   RDW 14.5 11.5 - 15.5 %   Platelets 109 (L) 150 - 400 K/uL    Comment: REPEATED TO VERIFY CONSISTENT WITH PREVIOUS RESULT    Neutrophils Relative % 73 %   Neutro Abs 5.4 1.7 - 7.7 K/uL   Lymphocytes Relative 14 %   Lymphs Abs 1.1 0.7 - 4.0 K/uL   Monocytes Relative 9 %   Monocytes Absolute 0.7 0.1 - 1.0 K/uL   Eosinophils Relative 4 %   Eosinophils Absolute 0.3 0.0 - 0.7 K/uL   Basophils Relative 0 %   Basophils Absolute 0.0 0.0 - 0.1 K/uL  Comprehensive metabolic panel     Status: Abnormal   Collection Time: 06/25/16  8:06 AM  Result Value Ref Range   Sodium 139 135 - 145 mmol/L   Potassium 4.1 3.5 - 5.1 mmol/L   Chloride 105 101 - 111 mmol/L   CO2 27 22 - 32 mmol/L   Glucose, Bld 129 (H) 65 - 99 mg/dL   BUN 20 6 - 20 mg/dL   Creatinine, Ser 1.40 (H) 0.61 - 1.24 mg/dL   Calcium 8.6 (L) 8.9 - 10.3 mg/dL   Total Protein 6.1 (L) 6.5 - 8.1 g/dL   Albumin 3.1 (L) 3.5 - 5.0 g/dL   AST 19 15 - 41 U/L   ALT 17 17 - 63 U/L   Alkaline Phosphatase 71 38 - 126 U/L   Total Bilirubin 0.8 0.3 - 1.2 mg/dL   GFR calc non Af Amer 48 (L) >60 mL/min   GFR calc Af Amer 56 (L) >60 mL/min    Comment: (NOTE) The eGFR has been calculated using the CKD EPI equation. This calculation has not been  validated in all clinical situations. eGFR's persistently <60 mL/min signify possible Chronic Kidney Disease.    Anion gap 7 5 - 15     HEENT: facial eczema Cardio: irreg irreg no murmur Resp: CTA B/L and unlabored GI: BS positive and NT, ND Extremity:  No Edema Skin:   Rash dry erythemous plaques  Neuro: Alert/Oriented, Normal Sensory, Abnormal Motor visual fields intact, Abnormal FMC Ataxic/ dec FMC and Other no evidence of neglect Musc/Skel:  Other no pain with UE or LE ROM Gen NAD   Assessment/Plan: 1. Functional deficits secondary to RIght Caudate infarct which require 3+ hours per day of interdisciplinary therapy in a comprehensive inpatient rehab setting. Physiatrist is providing close team supervision and 24 hour management of active medical problems listed below. Physiatrist and rehab team continue to assess barriers to discharge/monitor patient progress toward functional and medical goals. FIM: Function - Bathing Position: Shower Body parts bathed by patient: Right   arm, Left upper leg, Left arm, Right lower leg, Chest, Abdomen, Left lower leg, Front perineal area, Buttocks, Right upper leg Body parts bathed by helper: Back Assist Level: Touching or steadying assistance(Pt > 75%)  Function- Upper Body Dressing/Undressing What is the patient wearing?: Pull over shirt/dress Pull over shirt/dress - Perfomed by patient: Thread/unthread right sleeve, Thread/unthread left sleeve, Put head through opening, Pull shirt over trunk Pull over shirt/dress - Perfomed by helper: Pull shirt over trunk Assist Level: Supervision or verbal cues, Set up Set up : To obtain clothing/put away Function - Lower Body Dressing/Undressing What is the patient wearing?: Pants, Underwear, Socks, Shoes Position: Other (comment) (Sitting on toilet) Underwear - Performed by patient: Thread/unthread right underwear leg, Thread/unthread left underwear leg, Pull underwear up/down Pants- Performed by  patient: Thread/unthread right pants leg, Thread/unthread left pants leg, Pull pants up/down Pants- Performed by helper: Fasten/unfasten pants Non-skid slipper socks- Performed by helper: Don/doff right sock, Don/doff left sock Socks - Performed by patient: Don/doff right sock, Don/doff left sock Shoes - Performed by patient: Don/doff right shoe, Don/doff left shoe Assist for footwear: Partial/moderate assist Assist for lower body dressing: Touching or steadying assistance (Pt > 75%)  Function - Toileting Toileting steps completed by patient: Adjust clothing prior to toileting, Performs perineal hygiene, Adjust clothing after toileting Toileting steps completed by helper: Adjust clothing prior to toileting, Performs perineal hygiene, Adjust clothing after toileting Assist level: Supervision or verbal cues  Function - Air cabin crew transfer assistive device: Grab bar, Walker Assist level to toilet: Supervision or verbal cues Assist level from toilet: Supervision or verbal cues  Function - Chair/bed transfer Chair/bed transfer method: Stand pivot Chair/bed transfer assist level: Touching or steadying assistance (Pt > 75%) Chair/bed transfer assistive device: Armrests, Walker Chair/bed transfer details: Tactile cues for initiation, Tactile cues for sequencing, Tactile cues for placement, Tactile cues for weight bearing  Function - Locomotion: Wheelchair Will patient use wheelchair at discharge?: No Type: Manual Max wheelchair distance: 171f  Assist Level: Supervision or verbal cues Assist Level: Supervision or verbal cues Wheel 150 feet activity did not occur: Refused Assist Level: Supervision or verbal cues Turns around,maneuvers to table,bed, and toilet,negotiates 3% grade,maneuvers on rugs and over doorsills: No Function - Locomotion: Ambulation Assistive device: No device Max distance: 1211f Assist level: Touching or steadying assistance (Pt > 75%) Assist level:  Touching or steadying assistance (Pt > 75%) Assist level: Touching or steadying assistance (Pt > 75%) Walk 150 feet activity did not occur: Safety/medical concerns Walk 10 feet on uneven surfaces activity did not occur:  (with RW ) Assist level: Moderate assist (Pt 50 - 74%)  Function - Comprehension Comprehension: Auditory Comprehension assistive device: Hearing aids Comprehension assist level: Follows complex conversation/direction with extra time/assistive device  Function - Expression Expression: Verbal Expression assist level: Expresses complex ideas: With extra time/assistive device  Function - Social Interaction Social Interaction assist level: Interacts appropriately with others with medication or extra time (anti-anxiety, antidepressant).  Function - Problem Solving Problem solving assist level: Solves complex 90% of the time/cues < 10% of the time  Function - Memory Memory assist level: Complete Independence: No helper Patient normally able to recall (first 3 days only): Current season, Staff names and faces, Location of own room, That he or she is in a hospital   Medical Problem List and Plan: 1. Left-sided weaknesssecondary to Acute right caudate infarct likely secondary to small vessel disease -CIR PT, OT, SLP, cont rehab -Parkinsonian features? 2. DVT Prophylaxis/Anticoagulation:  Subcutaneous Lovenox. Monitor platelet counts of any signs of bleeding 3. Pain Management: Tylenol as needed 4. Mood: Provide emotional support 5. Neuropsych: This patient iscapable of making decisions on hisown behalf. 6. Skin/Wound Care: Routine skin checks 7. Fluids/Electrolytes/Nutrition: Routine I&O, normal BUN, creat mildly elevated 8.Arteriosclerotic heart disease with stenting. Continue aspirin and Plavix 9.Diet-controlled diabetes mellitus. Hemoglobin A1c 5.8. CBG checks discontinued 10.History of hypertension. Permissive hypertension. Resume  lisinopril 10 mg daily as needed 11.Tobacco abuse. Nicoderm patch. Provide counseling 12.Hyperlipidemia. Crestor 13.History of Thrombocytopenia. 117,000. Follow-up plt stable at 106K  15.  Hypoalbuminemia- prostat 16.  Hx CAD s/p stent on chronic ASA and Plavix but had small vessel infarct nevertheless, heavy smoker, discussed with cardiology 17.  CKD, no nephrotoxic meds, will monitor 18.  Poor toenail hygiene borderline diabetic needs podiatry f/u as OP LOS (Days) 3 A FACE TO FACE EVALUATION WAS PERFORMED  Devontaye Ground E 06/27/2016, 7:24 AM

## 2016-06-27 NOTE — IPOC Note (Signed)
Overall Plan of Care [facility]) Patient Details Name: Donald Richardson MRN: [id number] DOB: 02/11/1941  Admitting Diagnosis: CVA  Hospital Problems: Active Problems:   Small vessel disease, cerebrovascular   Left hemiparesis ([place])     Functional Problem List: Nursing Endurance, Medication Management, Safety, Skin Integrity, Bladder, Bowel  PT Balance, Endurance, Motor, Safety  OT Balance, Endurance, Motor, Safety  SLP    TR         Basic ADL's: OT Grooming, Bathing, Dressing, Toileting     Advanced  ADL's: OT Simple Meal Preparation, Laundry, Light Housekeeping     Transfers: PT Bed Mobility, Bed to Chair, Car, [employer], Futures trader, Metallurgist: PT Ambulation, Emergency planning/management officer, Stairs     Additional Impairments: OT None  SLP        TR      Anticipated Outcomes Item Anticipated Outcome  Self Feeding Mod I  Swallowing      Basic self-care  Mod I  Toileting  Mod I   Bathroom Transfers Mod I  Bowel/Bladder  Remain continent bowel and bladder, no s/s infection, retention.  Transfers  Mod I with LRAD   Locomotion  Mod I with LRAD   Communication     Cognition     Pain  Managed at goal 2/10  Safety/Judgment  Increased safety awareness, no falls, injury this admission.   Therapy Plan: PT Intensity: Minimum of 1-2 x/day ,45 to 90 minutes PT Frequency: 5 out of 7 days PT Duration Estimated Length of Stay: 14-16 days  OT Intensity: Minimum of 1-2 x/day, 45 to 90 minutes OT Frequency: 5 out of 7 days OT Duration/Estimated Length of Stay: 14 days         Team Interventions: Nursing Interventions Patient/Family Education, Disease Management/Prevention, Skin Care/Wound Management, Bladder Management, Bowel Management, Medication Management, Psychosocial Support, Discharge Planning  PT interventions Ambulation/gait training, Balance/vestibular training, Community reintegration, Discharge planning, DME/adaptive equipment instruction,  Functional electrical stimulation, Functional mobility training, Disease management/prevention, Neuromuscular re-education, Patient/family education, Skin care/wound management, Stair training, Therapeutic Activities, Therapeutic Exercise, UE/LE Strength taining/ROM, Visual/perceptual remediation/compensation, UE/LE Coordination activities, Wheelchair propulsion/positioning  OT Interventions Training and development officer, Discharge planning, Community reintegration, Disease mangement/prevention, Engineer, drilling, Functional mobility training, Neuromuscular re-education, Pain management, Psychosocial support, Patient/family education, Self Care/advanced ADL retraining, Therapeutic Activities, Therapeutic Exercise, UE/LE Strength taining/ROM, UE/LE Coordination activities  SLP Interventions    TR Interventions    SW/CM Interventions Discharge Planning, Psychosocial Support, Patient/Family Education    Team Discharge Planning: Destination: PT-Home ,OT- Home , SLP-  Projected Follow-up: PT-Home health PT, OT-  Home health OT, SLP-  Projected Equipment Needs: PT-Rolling walker with 5" wheels, Cane, To be determined, OT- Tub/shower bench, To be determined, SLP-  Equipment Details: PT- , OT-  Patient/family involved in discharge planning: PT- Patient, Family member/caregiver,  OT-Patient, SLP-   MD ELOS: 10-14d Medical Rehab Prognosis:  Good Assessment:  76 y.o.right handed malewith history of tobacco abuse/COPD, thrombocytopenia, arteriosclerotic heart disease with stenting maintained on aspirin and Plavix,diet controlleddiabetes mellitus, hyperlipidemia and hypertension. History taken from chart review. Patient lives alone and was independent prior to admission. One level home with 2 steps to entry.Closest family is brotherin [employer] to assist if needed .Presented 06/20/2016 for left-sided weakness and fall 2 without loss of consciousness. CT of the  head showed a 9 mm hyperdense focus near the head of the right caudate no significant surrounding edema or mass effect. MRI reviewed, showing right caudate infarct.  Per report, acute infarct of the right caudate tail extending inferiorly along the posterior limb of the right internal capsule. MRA unremarkable. Carotid Dopplers in no ICA stenosis. Echocardiogram with ejection fraction of 40% grade 2 diastolic dysfunction. Neurology consulted presently on aspirin and Plavix for CVA prophylaxis.   Now requiring 24/7 Rehab RN,MD, as well as CIR level PT, OT and SLP.  Treatment team will focus on ADLs and mobility with goals set at See Team Conference Notes for weekly updates to the plan of care

## 2016-06-27 NOTE — Progress Notes (Signed)
Physical Therapy Session Note  Patient Details  Name: Donald Richardson MRN: [id number] Date of Birth: [birth year]  Today's Date: 06/27/2016 PT Individual Time: 0800-0900 PT Individual Time Calculation (min): 60 min   Short Term Goals: Week 1:  PT Short Term Goal 1 (Week 1): Pt will perform bed mobility without cues or assist from PT PT Short Term Goal 2 (Week 1): Pt will perform sit<>stand transfers with supervision assist consistently. PT Short Term Goal 3 (Week 1): Pt will perform stand pivot transfers with supervision assist with LRAD consistently PT Short Term Goal 4 (Week 1): Pt will ambulate 150f with supervision assist and LRAD    Skilled Therapeutic Interventions/Progress Updates:   Pt received supine in bed and agreeable to PT. Supine>sit transfer with supervision assist and  Moderate cues for sequecing. '  Gait with RW x 1569fwith supervision assist progressing to  MiHenryssist from PT. Increasing cues with distance for improved posture, gait pattern and AD management to prevent Anterior LOB  Standing balance while building pipe tree. Standing on level surface for first bout and standing on airex pad for second and third. No LOB on level surface. Increasing L lateral lean with time on airex pad with only mild use of hip strategy to correct LOB. Moderate cues for awareness of LOB and decreased UE.   dynamic gait training with RW and supervision assist from PT. Pt noted to have intermittent step to gait pattern and decreased step height  On the LLE. Moderate cues for AD management   [facility] [place] reach and all needs met.        Therapy Documentation Precautions:  Precautions Precautions: Fall Restrictions Weight Bearing Restrictions: No General:   Vital Signs: Therapy Vitals Temp: 97.9 F (36.6 C) Temp Source: Oral Pulse Rate: (!) 48 Resp: 16 BP: 122/61 Patient Position (if appropriate): Lying Oxygen Therapy SpO2:  97 % O2 Device: Not Delivered Pain: Pain Assessment Pain Assessment: No/denies pain Pain Score: 0-No pain   See Function Navigator for Current Functional Status.   Therapy/Group: Individual Therapy  [name]/27/2018, 9:03 AM

## 2016-06-28 ENCOUNTER — Inpatient Hospital Stay (HOSPITAL_COMMUNITY): Payer: Medicare Other

## 2016-06-28 ENCOUNTER — Inpatient Hospital Stay (HOSPITAL_COMMUNITY): Payer: Medicare Other | Admitting: Physical Therapy

## 2016-06-28 NOTE — Progress Notes (Signed)
Occupational Therapy Session Note  Patient Details  Name: Donald Richardson MRN: [id number] Date of Birth: Jul 05, 1940  Today's Date: 06/28/2016 OT Individual Time: 6825-7493 OT Individual Time Calculation (min): 87 min    Short Term Goals: Week 1:  OT Short Term Goal 1 (Week 1): Pt will complete toileting task with supervision OT Short Term Goal 2 (Week 1): Pt will complete full dressing task with superviison/ set-up assist OT Short Term Goal 3 (Week 1): Will initiate IADL re-training OT Short Term Goal 4 (Week 1): Pt will ambulate into bathroom in prep for showering/ toileting tasks with CGA  Skilled Therapeutic Interventions/Progress Updates:    1:1 no c/o pain. focus on BUE strengthening/coordination andfADL retraining. Pt stands with RW at dresser to fold laundry and place in drawers with increased time and 1 seated rest break. Pt stands ~17 min in total. Pt ambulates into bathroom to void bowel/bladder. Pt completes clothing management/hygiene with CGA and VC for posture. Pt bathes seated on TTB with increased time and supervision for standing while washing buttocks with VC to use grab bar for steadying. Pt dons UB/LB clothing with supervision while seated in w/c with RW for sit to stand with VC for safety awareness. Pt dons B socks with Vc to assume seated figure 4. Educated pt on shoe funnel use d/t heel of shoe crushed when putting foot in. Pt able to demonstrate technique and dons B shoes with shoe funnel. Pt stands at sink to complete oral care with supervision for VC for RW management when walking backwards to w/c when oral care terminated. Exited session with pt seated in w/c with call light in reach and all needs met.   Therapy Documentation Precautions:  Precautions Precautions: Fall Restrictions Weight Bearing Restrictions: No  See Function Navigator for Current Functional Status.   Therapy/Group: Individual Therapy  Tonny Branch 06/28/2016, 8:42 AM

## 2016-06-28 NOTE — Progress Notes (Signed)
Subjective/Complaints:  No issues overnite ROS-  Neg N/V/D.  No CP or SOB  Objective: Vital Signs: Blood pressure (!) 134/57, pulse (!) 57, temperature 98.3 F (36.8 C), temperature source Oral, resp. rate 16, weight 67.4 kg (148 lb 9.6 oz), SpO2 93 %. No results found. Results for orders placed or performed during the hospital encounter of 06/24/16 (from the past 72 hour(s))  CBC WITH DIFFERENTIAL     Status: Abnormal   Collection Time: 06/25/16  8:06 AM  Result Value Ref Range   WBC 7.4 4.0 - 10.5 K/uL   RBC 4.94 4.22 - 5.81 MIL/uL   Hemoglobin 14.8 13.0 - 17.0 g/dL   HCT 44.9 39.0 - 52.0 %   MCV 90.9 78.0 - 100.0 fL   MCH 30.0 26.0 - 34.0 pg   MCHC 33.0 30.0 - 36.0 g/dL   RDW 14.5 11.5 - 15.5 %   Platelets 109 (L) 150 - 400 K/uL    Comment: REPEATED TO VERIFY CONSISTENT WITH PREVIOUS RESULT    Neutrophils Relative % 73 %   Neutro Abs 5.4 1.7 - 7.7 K/uL   Lymphocytes Relative 14 %   Lymphs Abs 1.1 0.7 - 4.0 K/uL   Monocytes Relative 9 %   Monocytes Absolute 0.7 0.1 - 1.0 K/uL   Eosinophils Relative 4 %   Eosinophils Absolute 0.3 0.0 - 0.7 K/uL   Basophils Relative 0 %   Basophils Absolute 0.0 0.0 - 0.1 K/uL  Comprehensive metabolic panel     Status: Abnormal   Collection Time: 06/25/16  8:06 AM  Result Value Ref Range   Sodium 139 135 - 145 mmol/L   Potassium 4.1 3.5 - 5.1 mmol/L   Chloride 105 101 - 111 mmol/L   CO2 27 22 - 32 mmol/L   Glucose, Bld 129 (H) 65 - 99 mg/dL   BUN 20 6 - 20 mg/dL   Creatinine, Ser 1.40 (H) 0.61 - 1.24 mg/dL   Calcium 8.6 (L) 8.9 - 10.3 mg/dL   Total Protein 6.1 (L) 6.5 - 8.1 g/dL   Albumin 3.1 (L) 3.5 - 5.0 g/dL   AST 19 15 - 41 U/L   ALT 17 17 - 63 U/L   Alkaline Phosphatase 71 38 - 126 U/L   Total Bilirubin 0.8 0.3 - 1.2 mg/dL   GFR calc non Af Amer 48 (L) >60 mL/min   GFR calc Af Amer 56 (L) >60 mL/min    Comment: (NOTE) The eGFR has been calculated using the CKD EPI equation. This calculation has not been validated in  all clinical situations. eGFR's persistently <60 mL/min signify possible Chronic Kidney Disease.    Anion gap 7 5 - 15     HEENT: facial eczema Cardio: irreg irreg no murmur Resp: CTA B/L and unlabored GI: BS positive and NT, ND Extremity:  No Edema Skin:   Rash dry erythemous plaques  Neuro: Alert/Oriented, Normal Sensory, Abnormal Motor visual fields intact, Abnormal FMC Ataxic/ dec FMC and Other no evidence of neglect Musc/Skel:  Other no pain with UE or LE ROM Gen NAD   Assessment/Plan: 1. Functional deficits secondary to RIght Caudate infarct which require 3+ hours per day of interdisciplinary therapy in a comprehensive inpatient rehab setting. Physiatrist is providing close team supervision and 24 hour management of active medical problems listed below. Physiatrist and rehab team continue to assess barriers to discharge/monitor patient progress toward functional and medical goals. FIM: Function - Bathing Position: Shower Body parts bathed by patient: Right arm,  Left upper leg, Left arm, Right lower leg, Chest, Abdomen, Left lower leg, Front perineal area, Buttocks, Right upper leg Body parts bathed by helper: Back Assist Level: Touching or steadying assistance(Pt > 75%)  Function- Upper Body Dressing/Undressing What is the patient wearing?: Pull over shirt/dress Pull over shirt/dress - Perfomed by patient: Thread/unthread right sleeve, Thread/unthread left sleeve, Put head through opening, Pull shirt over trunk Pull over shirt/dress - Perfomed by helper: Pull shirt over trunk Assist Level: Supervision or verbal cues, Set up Set up : To obtain clothing/put away Function - Lower Body Dressing/Undressing What is the patient wearing?: Pants, Underwear, Socks, Shoes Position: Sitting EOB Underwear - Performed by patient: Thread/unthread right underwear leg, Thread/unthread left underwear leg, Pull underwear up/down Pants- Performed by patient: Thread/unthread right pants leg,  Thread/unthread left pants leg, Pull pants up/down Pants- Performed by helper: Fasten/unfasten pants Non-skid slipper socks- Performed by helper: Don/doff right sock, Don/doff left sock Socks - Performed by patient: Don/doff right sock, Don/doff left sock Shoes - Performed by patient: Don/doff right shoe, Don/doff left shoe Assist for footwear: Supervision/touching assist Assist for lower body dressing: Touching or steadying assistance (Pt > 75%)  Function - Toileting Toileting steps completed by patient: Adjust clothing prior to toileting, Performs perineal hygiene, Adjust clothing after toileting Toileting steps completed by helper: Adjust clothing prior to toileting, Performs perineal hygiene, Adjust clothing after toileting Assist level: Supervision or verbal cues  Function - Toilet Transfers Toilet transfer assistive device: Grab bar, Walker Assist level to toilet: Supervision or verbal cues Assist level from toilet: Supervision or verbal cues  Function - Chair/bed transfer Chair/bed transfer method: Ambulatory Chair/bed transfer assist level: Supervision or verbal cues Chair/bed transfer assistive device: Armrests, Walker Chair/bed transfer details: Tactile cues for initiation, Tactile cues for sequencing, Tactile cues for placement, Tactile cues for weight bearing  Function - Locomotion: Wheelchair Will patient use wheelchair at discharge?: No Type: Manual Max wheelchair distance: 139f  Assist Level: Supervision or verbal cues Assist Level: Supervision or verbal cues Wheel 150 feet activity did not occur: Refused Assist Level: Supervision or verbal cues Turns around,maneuvers to table,bed, and toilet,negotiates 3% grade,maneuvers on rugs and over doorsills: No Function - Locomotion: Ambulation Assistive device: Walker-rolling Max distance: 1585f Assist level: Touching or steadying assistance (Pt > 75%) Assist level: Supervision or verbal cues Assist level: Touching or  steadying assistance (Pt > 75%) Walk 150 feet activity did not occur: Safety/medical concerns Assist level: Touching or steadying assistance (Pt > 75%) Walk 10 feet on uneven surfaces activity did not occur:  (with RW ) Assist level: Moderate assist (Pt 50 - 74%)  Function - Comprehension Comprehension: Auditory Comprehension assistive device: Hearing aids Comprehension assist level: Follows basic conversation/direction with no assist  Function - Expression Expression: Verbal Expression assist level: Expresses basic needs/ideas: With no assist  Function - Social Interaction Social Interaction assist level: Interacts appropriately with others - No medications needed.  Function - Problem Solving Problem solving assist level: Solves basic 90% of the time/requires cueing < 10% of the time  Function - Memory Memory assist level: Recognizes or recalls 90% of the time/requires cueing < 10% of the time Patient normally able to recall (first 3 days only): Current season, Location of own room, Staff names and faces, That he or she is in a hospital   Medical Problem List and Plan: 1. 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 DVT Prophylaxis/Anticoagulation: Subcutaneous Lovenox. Monitor platelet counts of any signs of bleeding 3. Pain Management: Tylenol as needed 4. Mood: Provide emotional support 5. Neuropsych: This patient iscapable of making decisions on hisown behalf. 6. Skin/Wound Care: Routine skin checks 7. Fluids/Electrolytes/Nutrition: Routine I&O, normal BUN, creat mildly elevated 8.Arteriosclerotic heart disease with stenting. Continue aspirin and Plavix 9.Diet-controlled diabetes mellitus. Hemoglobin A1c 5.8. CBG checks discontinued 10.History of hypertension.  Permissive hypertension. Resume lisinopril 10 mg daily as needed 11.Tobacco abuse. Nicoderm patch. Provide counseling 12.Hyperlipidemia. Crestor 13.History of Thrombocytopenia. 117,000. Follow-up plt stable at 106K  15.  Hypoalbuminemia- prostat 16.  Hx CAD s/p stent on chronic ASA and Plavix but had small vessel infarct nevertheless, heavy smoker, discussed with cardiology 17.  CKD, no nephrotoxic meds, will monitor 18.  Poor toenail hygiene borderline diabetic needs podiatry f/u as OP LOS (Days) 4 A FACE TO FACE EVALUATION WAS PERFORMED  Merion Caton E 06/28/2016, 7:28 AM

## 2016-06-28 NOTE — Progress Notes (Signed)
Physical Therapy Session Note  Patient Details  Name: Donald Richardson MRN: [id number] Date of Birth: Feb 26, 1941  Today's Date: 06/28/2016 PT Individual Time: 1015-1130 PT Individual Time Calculation (min): 75 min   Short Term Goals: Week 1:  PT Short Term Goal 1 (Week 1): Pt will perform bed mobility without cues or assist from PT PT Short Term Goal 2 (Week 1): Pt will perform sit<>stand transfers with supervision assist consistently. PT Short Term Goal 3 (Week 1): Pt will perform stand pivot transfers with supervision assist with LRAD consistently PT Short Term Goal 4 (Week 1): Pt will ambulate 12ft with supervision assist and LRAD    Skilled Therapeutic Interventions/Progress Updates:    no c/o pain.  Session focus on activity tolerance, LLE NMR, R weight shift, gait, and balance.    Pt completes NMR in kinetron from seated position at 25 cm/s x3 minutes, in standing with feet slightly plantarflexed to facilitate activation of extensors: static standing 2x30 seconds at 35>20cm/s focus on equal weight bearing through LEs during transition from sit<>stand, standing marching x25 cycles at 35 cm/s, and standing marching without UE support focus on weight shift x4 cycles with min guard assist at 25 cm/s.  NMR standing on foam wedge (downhill), x2 bouts of 3-4 minutes during table top activity for standing tolerance and NMR for activation of extensors.    Gait training [phone]' with RW, min assist and increasing visual cues for walker positioning and verbal cues for L step length.  Pt requires increasing frequencies of short standing rest breaks with fatigue. Returned to room at end of session and positioned in w/c with call bell in reach and family present.   Therapy Documentation Precautions:  Precautions Precautions: Fall Restrictions Weight Bearing Restrictions: No  See Function Navigator for Current Functional Status.   Therapy/Group: Individual Therapy  Donald Richardson  [place] 06/28/2016, 10:55 AM

## 2016-06-28 NOTE — Progress Notes (Signed)
Physical Therapy Session Note  Patient Details  Name: Donald Richardson MRN: [id number] Date of Birth: 08/12/1940  Today's Date: 06/28/2016 PT Individual Time: 1400-1430 PT Individual Time Calculation (min): 30 min   Short Term Goals: Week 1:  PT Short Term Goal 1 (Week 1): Pt will perform bed mobility without cues or assist from PT PT Short Term Goal 2 (Week 1): Pt will perform sit<>stand transfers with supervision assist consistently. PT Short Term Goal 3 (Week 1): Pt will perform stand pivot transfers with supervision assist with LRAD consistently PT Short Term Goal 4 (Week 1): Pt will ambulate 131ft with supervision assist and LRAD    Skilled Therapeutic Interventions/Progress Updates:  Pt was seen bedside in the pm. Pt performed multiple sit to stand and stand pivot transfers with rolling walker and S. Pt ambulated 200 feet x 2 with rolling walker and S with several standing rest breaks. Pt performed step taps and alternating step taps 3 sets x 10 reps each for NMR. Pt returned to room and left sitting up in w/c with call bell within reach.   Therapy Documentation Precautions:  Precautions Precautions: Fall Restrictions Weight Bearing Restrictions: No General:   Pain: No c/o pain.   See Function Navigator for Current Functional Status.   Therapy/Group: Individual Therapy  Dub Amis 06/28/2016, 3:40 PM

## 2016-06-29 ENCOUNTER — Inpatient Hospital Stay (HOSPITAL_COMMUNITY): Payer: Medicare Other

## 2016-06-29 NOTE — Progress Notes (Signed)
Occupational Therapy Session Note  Patient Details  Name: Donald Richardson MRN: [id number] Date of Birth: October 04, 1940  Today's Date: 06/29/2016 OT Individual Time: 1300-1400 OT Individual Time Calculation (min): 60 min    Short Term Goals: Week 1:  OT Short Term Goal 1 (Week 1): Pt will complete toileting task with supervision OT Short Term Goal 2 (Week 1): Pt will complete full dressing task with superviison/ set-up assist OT Short Term Goal 3 (Week 1): Will initiate IADL re-training OT Short Term Goal 4 (Week 1): Pt will ambulate into bathroom in prep for showering/ toileting tasks with CGA  Skilled Therapeutic Interventions/Progress Updates: ADL-retraining with emphasis on standing balance, functional mobility, grooming and toileting thoroughness.   Pt received seated on w/c, alert and receptive for treatment.   With min vc to alert pt to appearance of stubble on his face, pt requests to shave during this session, standing at sink and using safety razor.   Pt possesses 2 electric razors but prefers use of safety razors despite awareness of mild tremor, which pt states has been present for several years.   OT noted pt's collection of razors in his travel bag, all of which were heavily soiled and clogged with hair/skin residues.   OT demo'd method to clean razors and recap with protectors to prevent accidental injury.    Pt then proceeded with shaving, standing at sink.   While pt stood at sink, OT noted pt's skin as excessively flaky, resembling chronic condition like eczema.   Pt was aware of condition and reports use of creams/lotions but has not consulted with dermatologist to date, relying instead on prime care MD interventions.   Pt then reported need to void BM and ambulated to bathroom w/o AD and accepting only CGA.   Pt managed clothing, required extra time to eliminate BM and returned to room for change of clothing, donning new underwear and pants while sitting on reclining chair.  Pt required  mod assist to rise from chair and return to sink to brush his teeth.   Pt completed grooming unassisted and elected to recover to w/c to rest at end of session with all needs placed within reach.    Therapy Documentation Precautions:  Precautions Precautions: Fall Restrictions Weight Bearing Restrictions: No   Vital Signs: Therapy Vitals Temp: 97.6 F (36.4 C) Temp Source: Oral Pulse Rate: 60 Resp: 17 BP: 113/86 Patient Position (if appropriate): Sitting Oxygen Therapy SpO2: 97 % O2 Device: Not Delivered   Pain: Pain Assessment Pain Assessment: No/denies pain   See Function Navigator for Current Functional Status.   Therapy/Group: Individual Therapy  [place] 06/29/2016, 2:52 PM

## 2016-06-29 NOTE — Progress Notes (Signed)
Subjective/Complaints:  No issues overnite, denies pain  ROS-  Neg N/V/D.  No CP or SOB  Objective: Vital Signs: Blood pressure 138/67, pulse (!) 50, temperature 98 F (36.7 C), temperature source Oral, resp. rate 16, weight 67.4 kg (148 lb 9.6 oz), SpO2 94 %. No results found. No results found for this or any previous visit (from the past 72 hour(s)).   HEENT: facial eczema Cardio: irreg irreg no murmur Resp: CTA B/L and unlabored GI: BS positive and NT, ND Extremity:  No Edema Skin:   Rash dry erythemous plaques  Neuro: Alert/Oriented, Normal Sensory, Abnormal Motor visual fields intact, Abnormal FMC Ataxic/ dec FMC and Other no evidence of neglect Musc/Skel:  Other no pain with UE or LE ROM Gen NAD   Assessment/Plan: 1. Functional deficits secondary to RIght Caudate infarct which require 3+ hours per day of interdisciplinary therapy in a comprehensive inpatient rehab setting. Physiatrist is providing close team supervision and 24 hour management of active medical problems listed below. Physiatrist and rehab team continue to assess barriers to discharge/monitor patient progress toward functional and medical goals. FIM: Function - Bathing Position: Shower Body parts bathed by patient: Right arm, Left upper leg, Left arm, Right lower leg, Chest, Abdomen, Left lower leg, Front perineal area, Buttocks, Right upper leg Body parts bathed by helper: Back Assist Level: Supervision or verbal cues  Function- Upper Body Dressing/Undressing What is the patient wearing?: Pull over shirt/dress Pull over shirt/dress - Perfomed by patient: Thread/unthread right sleeve, Thread/unthread left sleeve, Put head through opening, Pull shirt over trunk Pull over shirt/dress - Perfomed by helper: Pull shirt over trunk Assist Level: Supervision or verbal cues, Set up Set up : To obtain clothing/put away Function - Lower Body Dressing/Undressing What is the patient wearing?: Pants, Underwear,  Socks, Shoes Position: Wheelchair/chair at sink Underwear - Performed by patient: Thread/unthread right underwear leg, Thread/unthread left underwear leg, Pull underwear up/down Pants- Performed by patient: Thread/unthread right pants leg, Thread/unthread left pants leg, Pull pants up/down Pants- Performed by helper: Fasten/unfasten pants Non-skid slipper socks- Performed by helper: Don/doff right sock, Don/doff left sock Socks - Performed by patient: Don/doff right sock, Don/doff left sock Shoes - Performed by patient: Don/doff right shoe, Don/doff left shoe (shoe funnel) Assist for footwear: Supervision/touching assist Assist for lower body dressing: Supervision or verbal cues  Function - Toileting Toileting steps completed by patient: Adjust clothing prior to toileting, Performs perineal hygiene, Adjust clothing after toileting Toileting steps completed by helper: Adjust clothing prior to toileting, Performs perineal hygiene, Adjust clothing after toileting Assist level: Supervision or verbal cues  Function - Toilet Transfers Toilet transfer assistive device: Grab bar, Walker Assist level to toilet: Supervision or verbal cues Assist level from toilet: Supervision or verbal cues  Function - Chair/bed transfer Chair/bed transfer method: Stand pivot Chair/bed transfer assist level: Supervision or verbal cues Chair/bed transfer assistive device: Armrests, Walker Chair/bed transfer details: Tactile cues for initiation, Tactile cues for sequencing, Tactile cues for placement, Tactile cues for weight bearing  Function - Locomotion: Wheelchair Will patient use wheelchair at discharge?: No Type: Manual Max wheelchair distance: 124ft  Assist Level: Supervision or verbal cues Assist Level: Supervision or verbal cues Wheel 150 feet activity did not occur: Refused Assist Level: Supervision or verbal cues Turns around,maneuvers to table,bed, and toilet,negotiates 3% grade,maneuvers on rugs  and over doorsills: No Function - Locomotion: Ambulation Assistive device: Walker-rolling Max distance: 200 Assist level: Touching or steadying assistance (Pt > 75%) Assist level: Touching or  steadying assistance (Pt > 75%) Assist level: Touching or steadying assistance (Pt > 75%) Walk 150 feet activity did not occur: Safety/medical concerns Assist level: Touching or steadying assistance (Pt > 75%) Walk 10 feet on uneven surfaces activity did not occur:  (with RW ) Assist level: Moderate assist (Pt 50 - 74%)  Function - Comprehension Comprehension: Auditory Comprehension assistive device: Hearing aids Comprehension assist level: Follows basic conversation/direction with no assist  Function - Expression Expression: Verbal Expression assist level: Expresses basic needs/ideas: With no assist  Function - Social Interaction Social Interaction assist level: Interacts appropriately with others - No medications needed.  Function - Problem Solving Problem solving assist level: Solves basic 90% of the time/requires cueing < 10% of the time  Function - Memory Memory assist level: Recognizes or recalls 90% of the time/requires cueing < 10% of the time Patient normally able to recall (first 3 days only): Current season, Location of own room, Staff names and faces, That he or she is in a hospital   Medical Problem List and Plan: 1. 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 DVT Prophylaxis/Anticoagulation: Subcutaneous Lovenox. Monitor platelet counts No signs of bleeding 3. Pain Management: Tylenol as needed 4. Mood: Provide emotional support 5. Neuropsych: This patient iscapable of making decisions on hisown behalf. 6. Skin/Wound Care:  Routine skin checks 7. Fluids/Electrolytes/Nutrition: Routine I&O, normal BUN, creat mildly elevated 8.Arteriosclerotic heart disease with stenting. Continue aspirin and Plavix 9.Diet-controlled diabetes mellitus. Hemoglobin A1c 5.8. CBG checks discontinued 10.History of hypertension. Permissive hypertension. Resume lisinopril 10 mg daily as needed 11.Tobacco abuse. Nicoderm patch. Provide counseling 12.Hyperlipidemia. Crestor 13.History of Thrombocytopenia. 117,000. Follow-up plt stable at 106K  15.  Hypoalbuminemia- prostat 16.  Hx CAD s/p stent on chronic ASA and Plavix but had small vessel infarct nevertheless, heavy smoker, discussed with cardiology 17.  CKD, no nephrotoxic meds, will monitor 18.  Poor toenail hygiene borderline diabetic needs podiatry f/u as OP 19.  Right hand resting tremor, father had Parkinson's disease, pt has had tremor for 31yr, not evaled by neurology LOS (Days) 5 A FACE TO FACE EVALUATION WAS PERFORMED  Donald Richardson 06/29/2016, 7:27 AM

## 2016-06-30 ENCOUNTER — Inpatient Hospital Stay (HOSPITAL_COMMUNITY): Payer: Medicare Other | Admitting: Physical Therapy

## 2016-06-30 ENCOUNTER — Encounter (HOSPITAL_COMMUNITY): Payer: Self-pay | Admitting: Emergency Medicine

## 2016-06-30 ENCOUNTER — Inpatient Hospital Stay (HOSPITAL_COMMUNITY): Payer: Medicare Other | Admitting: Occupational Therapy

## 2016-06-30 NOTE — Progress Notes (Signed)
Physical Therapy Session Note  Patient Details  Name: Donald Richardson MRN: [id number] Date of Birth: September 25, 1940  Today's Date: 06/30/2016 PT Individual Time: 1020-1130 and 1400-1500 PT Individual Time Calculation (min): 70 min  And 60 min  Short Term Goals: Week 1:  PT Short Term Goal 1 (Week 1): Pt will perform bed mobility without cues or assist from PT PT Short Term Goal 2 (Week 1): Pt will perform sit<>stand transfers with supervision assist consistently. PT Short Term Goal 3 (Week 1): Pt will perform stand pivot transfers with supervision assist with LRAD consistently PT Short Term Goal 4 (Week 1): Pt will ambulate 134f with supervision assist and LRAD    Skilled Therapeutic Interventions/Progress Updates: Tx1: Pt presented in bed agreeable to therapy. Performed supine to sit with supervision and use of features. Ambulated to rehab gym with RW and supervision with cues for increasing bilateral foot clearance and step length. Pt took x 2 standing rest breaks. Performed static balance activities on non-compliant and compliant surfaces. Pt able to maintain balance for approx 4 min then increased anterior lean noted with pt requiring use of UE for correction. Pt indicated urgency for toilet. Transported in w/c back to room due to urgency. Pt performed ambulatory transfer to toilet with use of wall rail and standard toilet. Min cues for RW placement and safety, however pt performed sit to/from stand at toilet with supervision (+continent BM). Returning to gym pt continued balance/coordination activities with toe taps and taps to target. Pt able to perform initially with HHA however demonstrating increased L lean with fatigue. Performed NuStep L3 x 6 min for endurance and use of reciprocal action. Pt ambulated back to room with RW in same manner as prior. Pt remained in w/c at end of session with refusal for chair exit alarm. Pt verbally acknowledged to use call bell for assistance.   Tx2:   Pt  presented in w/c agreeable to therapy. Trialed ambulation with SPC, pt demonstrated poor foot clearance and narrow BOS. Unable to improve foot clearance despite multimodal cues. Resumed use with RW. Performed static balance activities and standing tolerance performing peg board activities while having SLE on 6in step. Pt able to maintain balance with min cues for erect posture for bouts of approx 4 min. Performed steps on dowels with RW with pt hitting dowel with LLE 60% of trials. Pt performed supine LE therex for forced use of LLE including pull ins and LTR with green therapy ball. SLR, hip abd/add, heel slides, and bridges with ER x10/to fatigue with LLE. Pt ambulated back to room with RW with mod cues for L foot clearance and increasing step length which pt was able to maintain intermittently. Pt returned to w/c refusing chair alarm and current needs met.      Therapy Documentation Precautions:  Precautions Precautions: Fall Restrictions Weight Bearing Restrictions: No General:   Vital Signs:  Pain: Pain Assessment Pain Assessment: No/denies pain Pain Score: 0-No pain   See Function Navigator for Current Functional Status.   Therapy/Group: Individual Therapy  Alandra Sando  Breckyn Ticas, PTA  06/30/2016, 12:16 PM

## 2016-06-30 NOTE — Progress Notes (Signed)
Social Work Patient ID: Donald Richardson, male   DOB: 12-30-1940, 76 y.o.   MRN: [id number]  Spoke with [name]=pt's brother to discuss team conference goals-mod/I level and target discharge date 5/9. He is planning on placing a rail on pt's back steps to assist him with them and his safety. He is hoping he will reach the goals team has set for him. He is planning on coming here Wed or Thurs And will meet with and discuss team conference and plans.

## 2016-06-30 NOTE — Plan of Care (Signed)
Problem: RH SAFETY Goal: RH STG ADHERE TO SAFETY PRECAUTIONS W/ASSISTANCE/DEVICE STG Adhere to Safety Precautions With min Assistance/Device.  Outcome: Not Progressing Pt will follow directions in using call light system for assistance.

## 2016-06-30 NOTE — Progress Notes (Signed)
Subjective/Complaints:  Pt without new issues slept well , denies bowel or bladder issues  ROS-  Neg N/V/D.  No CP or SOB  Objective: Vital Signs: Blood pressure (!) 135/59, pulse 60, temperature 98 F (36.7 C), temperature source Oral, resp. rate 16, weight 67.4 kg (148 lb 9.6 oz), SpO2 95 %. No results found. No results found for this or any previous visit (from the past 72 hour(s)).   HEENT: facial eczema Cardio: irreg irreg no murmur Resp: CTA B/L and unlabored GI: BS positive and NT, ND Extremity:  No Edema Skin:   Rash dry erythemous plaques  Neuro: Alert/Oriented, Normal Sensory, Abnormal Motor visual fields intact, Abnormal FMC Ataxic/ dec FMC and Other no evidence of neglect. Left side 4+/5 UE and LE, 5/5 on RIght Musc/Skel:  Other no pain with UE or LE ROM Gen NAD   Assessment/Plan: 1. Functional deficits secondary to RIght Caudate infarct which require 3+ hours per day of interdisciplinary therapy in a comprehensive inpatient rehab setting. Physiatrist is providing close team supervision and 24 hour management of active medical problems listed below. Physiatrist and rehab team continue to assess barriers to discharge/monitor patient progress toward functional and medical goals. FIM: Function - Bathing Position: Shower Body parts bathed by patient: Right arm, Left upper leg, Left arm, Right lower leg, Chest, Abdomen, Left lower leg, Front perineal area, Buttocks, Right upper leg Body parts bathed by helper: Back Assist Level: Supervision or verbal cues  Function- Upper Body Dressing/Undressing What is the patient wearing?: Pull over shirt/dress Pull over shirt/dress - Perfomed by patient: Thread/unthread right sleeve, Thread/unthread left sleeve, Put head through opening, Pull shirt over trunk Pull over shirt/dress - Perfomed by helper: Pull shirt over trunk Assist Level: Supervision or verbal cues, Set up Set up : To obtain clothing/put away Function - Lower  Body Dressing/Undressing What is the patient wearing?: Pants, Underwear, Socks, Shoes Position: Wheelchair/chair at sink Underwear - Performed by patient: Thread/unthread right underwear leg, Thread/unthread left underwear leg, Pull underwear up/down Pants- Performed by patient: Thread/unthread right pants leg, Thread/unthread left pants leg, Pull pants up/down Pants- Performed by helper: Fasten/unfasten pants Non-skid slipper socks- Performed by helper: Don/doff right sock, Don/doff left sock Socks - Performed by patient: Don/doff right sock, Don/doff left sock Shoes - Performed by patient: Don/doff right shoe, Don/doff left shoe (shoe funnel) Assist for footwear: Supervision/touching assist Assist for lower body dressing: Supervision or verbal cues  Function - Toileting Toileting steps completed by patient: Adjust clothing prior to toileting, Performs perineal hygiene, Adjust clothing after toileting Toileting steps completed by helper: Adjust clothing prior to toileting, Performs perineal hygiene, Adjust clothing after toileting Assist level: More than reasonable time  Function - Toilet Transfers Toilet transfer assistive device: Grab bar, Walker Assist level to toilet: Supervision or verbal cues Assist level from toilet: Supervision or verbal cues  Function - Chair/bed transfer Chair/bed transfer method: Stand pivot Chair/bed transfer assist level: Supervision or verbal cues Chair/bed transfer assistive device: Armrests, Walker Chair/bed transfer details: Tactile cues for initiation, Tactile cues for sequencing, Tactile cues for placement, Tactile cues for weight bearing  Function - Locomotion: Wheelchair Will patient use wheelchair at discharge?: No Type: Manual Max wheelchair distance: 189ft  Assist Level: Supervision or verbal cues Assist Level: Supervision or verbal cues Wheel 150 feet activity did not occur: Refused Assist Level: Supervision or verbal cues Turns  around,maneuvers to table,bed, and toilet,negotiates 3% grade,maneuvers on rugs and over doorsills: No Function - Locomotion: Ambulation Assistive device: [employer]  Max distance: 200 Assist level: Touching or steadying assistance (Pt > 75%) Assist level: Touching or steadying assistance (Pt > 75%) Assist level: Touching or steadying assistance (Pt > 75%) Walk 150 feet activity did not occur: Safety/medical concerns Assist level: Touching or steadying assistance (Pt > 75%) Walk 10 feet on uneven surfaces activity did not occur:  (with RW ) Assist level: Moderate assist (Pt 50 - 74%)  Function - Comprehension Comprehension: Auditory Comprehension assistive device: Hearing aids Comprehension assist level: Follows basic conversation/direction with no assist  Function - Expression Expression: Verbal Expression assist level: Expresses basic needs/ideas: With no assist  Function - Social Interaction Social Interaction assist level: Interacts appropriately with others - No medications needed.  Function - Problem Solving Problem solving assist level: Solves basic 90% of the time/requires cueing < 10% of the time  Function - Memory Memory assist level: Recognizes or recalls 90% of the time/requires cueing < 10% of the time Patient normally able to recall (first 3 days only): Current season, Location of own room, Staff names and faces, That he or she is in a hospital   Medical Problem List and Plan: 1. 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 DVT Prophylaxis/Anticoagulation: Subcutaneous Lovenox. Monitor platelet counts No signs of bleeding 3. Pain Management: Tylenol as needed 4. Mood: Provide emotional support 5. Neuropsych: This patient iscapable of making decisions on hisown  behalf. 6. Skin/Wound Care: Routine skin checks 7. Fluids/Electrolytes/Nutrition: Routine I&O, normal BUN, creat mildly elevated 8.Arteriosclerotic heart disease with stenting. Continue aspirin and Plavix 9.Diet-controlled diabetes mellitus. Hemoglobin A1c 5.8. CBG checks discontinued 10.History of hypertension. Permissive hypertension. Resume lisinopril 10 mg daily as needed 11.Tobacco abuse. Nicoderm patch. Provide counseling 12.Hyperlipidemia. Crestor 13.History of Thrombocytopenia. 117,000. Follow-up plt stable at 106K  15.  Hypoalbuminemia- prostat 16.  Hx CAD s/p stent on chronic ASA and Plavix but had small vessel infarct nevertheless, heavy smoker, discussed with cardiology 17.  CKD, no nephrotoxic meds, will monitor 18.  Poor toenail hygiene borderline diabetic needs podiatry f/u as OP 19.  Right hand resting tremor, father had Parkinson's disease, pt has had tremor for 51yr, not evaled by neurology LOS (Days) 6 A FACE TO [place] E 06/30/2016, 7:39 AM

## 2016-06-30 NOTE — Progress Notes (Signed)
Occupational Therapy Session Note  Patient Details  Name: Donald Richardson MRN: [id number] Date of Birth: [birth year]  Today's Date: 06/30/2016 OT Individual Time: 0735-0900 OT Individual Time Calculation (min): 85 min    Short Term Goals: Week 1:  OT Short Term Goal 1 (Week 1): Pt will complete toileting task with supervision OT Short Term Goal 2 (Week 1): Pt will complete full dressing task with superviison/ set-up assist OT Short Term Goal 3 (Week 1): Will initiate IADL re-training OT Short Term Goal 4 (Week 1): Pt will ambulate into bathroom in prep for showering/ toileting tasks with CGA  Skilled Therapeutic Interventions/Progress Updates:    Pt seen for OT ADL bathing/dressing session. Pt sitting EOB upon arrival finishing breakfast and agreeable to tx session. He denied pain this morning and ready to get into shower. He ambulated throughout session with supervision using RW. He bathed seated on tub bench using grab bars for sit <> stand for buttock/pericare hygiene. Pt demonstrates improved dynamic standing balance this session, completing dynamic standing tasks with supervision. He dressed seated EOB, standing to pull pants up. VCs for figure four position inorder to don/doff socks. Pt required seated rest breaks throughout bathing/dressing session due to decreased activity tolerance.  He ambulated to sink and completed grooming tasks in standing with supervision. Trialed use of SPC walking in hallway. Pt required min A with VCs for increased step length and head up when ambulating. Educated extensively regarding pros/cons of RW vs. SPC. Will cont to work with cane in therapy, however, advised pt to use RW when ambulating with nursing staff. Also discussed pt's PLOF and IADLs needed to master prior to d/c home alone. Pt returned to room at end of session, left seated in w/c with all needs in reach. Educated extensively regarding pt's fall risk and need to use call bell for assist when  ambulating.   Throughout session, discussed d/c planning and steps to decreasing fall risk.   Therapy Documentation Precautions:  Precautions Precautions: Fall Restrictions Weight Bearing Restrictions: No  See Function Navigator for Current Functional Status.   Therapy/Group: Individual Therapy  Lewis, Media Pizzini C 06/30/2016, 7:12 AM

## 2016-07-01 ENCOUNTER — Inpatient Hospital Stay (HOSPITAL_COMMUNITY): Payer: Medicare Other | Admitting: Physical Therapy

## 2016-07-01 ENCOUNTER — Inpatient Hospital Stay (HOSPITAL_COMMUNITY): Payer: Medicare Other | Admitting: Occupational Therapy

## 2016-07-01 LAB — CREATININE, SERUM
CREATININE: 1.43 mg/dL — AB (ref 0.61–1.24)
GFR calc Af Amer: 54 mL/min — ABNORMAL LOW (ref >60)
GFR, EST NON AFRICAN AMERICAN: 47 mL/min — AB (ref >60)

## 2016-07-01 NOTE — Progress Notes (Signed)
Subjective/Complaints:  Pt without new issues slept well , denies bowel or bladder issues Feels like he would fall if he got up by himself  ROS-  Neg N/V/D.  No CP or SOB  Objective: Vital Signs: Blood pressure 122/62, pulse (!) 58, temperature 98.4 F (36.9 C), temperature source Oral, resp. rate 18, weight 67.4 kg (148 lb 9.6 oz), SpO2 97 %. No results found. Results for orders placed or performed during the hospital encounter of 06/24/16 (from the past 72 hour(s))  Creatinine, serum     Status: Abnormal   Collection Time: 07/01/16  5:00 AM  Result Value Ref Range   Creatinine, Ser 1.43 (H) 0.61 - 1.24 mg/dL   GFR calc non Af Amer 47 (L) >60 mL/min   GFR calc Af Amer 54 (L) >60 mL/min    Comment: (NOTE) The eGFR has been calculated using the CKD EPI equation. This calculation has not been validated in all clinical situations. eGFR's persistently <60 mL/min signify possible Chronic Kidney Disease.      HEENT: facial eczema Cardio: irreg irreg no murmur Resp: CTA B/L and unlabored GI: BS positive and NT, ND Extremity:  No Edema Skin:   Rash dry erythemous plaques  Neuro: Alert/Oriented, Normal Sensory, Abnormal Motor visual fields intact, Abnormal FMC Ataxic/ dec FMC and Other no evidence of neglect. Left side 4+/5 UE and LE, 5/5 on RIght Musc/Skel:  Other no pain with UE or LE ROM Gen NAD   Assessment/Plan: 1. Functional deficits secondary to RIght Caudate infarct which require 3+ hours per day of interdisciplinary therapy in a comprehensive inpatient rehab setting. Physiatrist is providing close team supervision and 24 hour management of active medical problems listed below. Physiatrist and rehab team continue to assess barriers to discharge/monitor patient progress toward functional and medical goals. FIM: Function - Bathing Position: Shower Body parts bathed by patient: Right arm, Left upper leg, Left arm, Right lower leg, Chest, Abdomen, Left lower leg, Front  perineal area, Buttocks, Right upper leg, Back Body parts bathed by helper: Back Assist Level: Set up Set up : To obtain items  Function- Upper Body Dressing/Undressing What is the patient wearing?: Pull over shirt/dress Pull over shirt/dress - Perfomed by patient: Thread/unthread right sleeve, Thread/unthread left sleeve, Put head through opening, Pull shirt over trunk Pull over shirt/dress - Perfomed by helper: Pull shirt over trunk Assist Level: Set up Set up : To obtain clothing/put away Function - Lower Body Dressing/Undressing What is the patient wearing?: Pants, Underwear, Socks, Shoes Position: Sitting EOB Underwear - Performed by patient: Thread/unthread right underwear leg, Thread/unthread left underwear leg, Pull underwear up/down Pants- Performed by patient: Thread/unthread right pants leg, Thread/unthread left pants leg, Pull pants up/down, Fasten/unfasten pants Pants- Performed by helper: Fasten/unfasten pants Non-skid slipper socks- Performed by helper: Don/doff right sock, Don/doff left sock Socks - Performed by patient: Don/doff right sock, Don/doff left sock Shoes - Performed by patient: Don/doff right shoe, Don/doff left shoe Assist for footwear: Supervision/touching assist Assist for lower body dressing: Supervision or verbal cues  Function - Toileting Toileting steps completed by patient: Adjust clothing prior to toileting, Performs perineal hygiene, Adjust clothing after toileting Toileting steps completed by helper: Adjust clothing prior to toileting, Performs perineal hygiene, Adjust clothing after toileting Assist level: More than reasonable time  Function - Air cabin crew transfer assistive device: Grab bar, Walker Assist level to toilet: Supervision or verbal cues Assist level from toilet: Supervision or verbal cues  Function - Chair/bed transfer Chair/bed transfer  method: Stand pivot Chair/bed transfer assist level: Supervision or verbal  cues Chair/bed transfer assistive device: Armrests, Walker Chair/bed transfer details: Tactile cues for initiation, Tactile cues for sequencing, Tactile cues for placement, Tactile cues for weight bearing  Function - Locomotion: Wheelchair Will patient use wheelchair at discharge?: No Type: Manual Max wheelchair distance: 148f  Assist Level: Supervision or verbal cues Assist Level: Supervision or verbal cues Wheel 150 feet activity did not occur: Refused Assist Level: Supervision or verbal cues Turns around,maneuvers to table,bed, and toilet,negotiates 3% grade,maneuvers on rugs and over doorsills: No Function - Locomotion: Ambulation Assistive device: Walker-rolling Max distance: 200 Assist level: Touching or steadying assistance (Pt > 75%) Assist level: Touching or steadying assistance (Pt > 75%) Assist level: Touching or steadying assistance (Pt > 75%) Walk 150 feet activity did not occur: Safety/medical concerns Assist level: Touching or steadying assistance (Pt > 75%) Walk 10 feet on uneven surfaces activity did not occur:  (with RW ) Assist level: Moderate assist (Pt 50 - 74%)  Function - Comprehension Comprehension: Auditory Comprehension assistive device: Hearing aids Comprehension assist level: Follows basic conversation/direction with no assist  Function - Expression Expression: Verbal Expression assist level: Expresses basic needs/ideas: With no assist  Function - Social Interaction Social Interaction assist level: Interacts appropriately with others - No medications needed.  Function - Problem Solving Problem solving assist level: Solves basic 90% of the time/requires cueing < 10% of the time  Function - Memory Memory assist level: Recognizes or recalls 90% of the time/requires cueing < 10% of the time Patient normally able to recall (first 3 days only): Current season, Location of own room, That he or she is in a hospital   Medical Problem List and Plan: 1.  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 DVT Prophylaxis/Anticoagulation: Subcutaneous Lovenox. Monitor platelet counts No signs of bleeding 3. Pain Management: Tylenol as needed 4. Mood: Provide emotional support 5. Neuropsych: This patient iscapable of making decisions on hisown behalf. 6. Skin/Wound Care: Routine skin checks 7. Fluids/Electrolytes/Nutrition: Routine I&O, normal BUN, creat mildly elevated 8.Arteriosclerotic heart disease with stenting. Continue aspirin and Plavix 9.Diet-controlled diabetes mellitus. Hemoglobin A1c 5.8. CBG checks discontinued 10.History of hypertension. Permissive hypertension. Resume lisinopril 10 mg daily as needed Vitals:   06/30/16 2020 07/01/16 0445  BP: 128/62 122/62  Pulse: (!) 52 (!) 58  Resp: 18 18  Temp:  98.4 F (36.9 C)   11.Tobacco abuse. Nicoderm patch. Provide counseling 12.Hyperlipidemia. Crestor 13.History of Thrombocytopenia. 117,000. Follow-up plt stable at 106K  15.  Hypoalbuminemia- prostat 16.  Hx CAD s/p stent on chronic ASA and Plavix but had small vessel infarct nevertheless, heavy smoker, discussed with cardiology 17.  CKD, no nephrotoxic meds, will monitor stable BMP Latest Ref Rng & Units 07/01/2016 06/25/2016 06/23/2016  Glucose 65 - 99 mg/dL - 129(H) 92  BUN 6 - 20 mg/dL - 20 15  Creatinine 0.61 - 1.24 mg/dL 1.43(H) 1.40(H) 1.25(H)  Sodium 135 - 145 mmol/L - 139 139  Potassium 3.5 - 5.1 mmol/L - 4.1 3.6  Chloride 101 - 111 mmol/L - 105 107  CO2 22 - 32 mmol/L - 27 26  Calcium 8.9 - 10.3 mg/dL - 8.6(L) 8.6(L)  18.  Poor toenail hygiene borderline diabetic needs podiatry f/u as OP 19.  Right hand resting tremor, father had Parkinson's  disease, pt has had tremor for 61yr not evaled by neurology 20.   Bradycardia not on BB, asymptomatic will monitor LOS (Days) 7 A FACE TO FACE EVALUATION WAS PERFORMED  Katy Brickell E 07/01/2016, 7:52 AM

## 2016-07-01 NOTE — Progress Notes (Signed)
Physical Therapy Weekly Progress Note  Patient Details  Name: Donald Richardson MRN: [id number] Date of Birth: [birth year]  Beginning of progress report period: June 25, 2016 End of progress report period: Jul 01, 2016  Today's Date: 07/01/2016   Patient has met 4 of 4 short term goals.  The patient has demonstrated some improvements in functional mobility during this course of treatment. He has shown improvements in bed mobility and transfers. He is overall supervision with gait with a RW distances up to 150 however continues to demonstrate poor foot clearance and a decreased BOS requiring moderate cues for safety. He would continue to benefit from skilled PT to improve balance and efficacy with gait to maximize safety with functional mobility.   Patient continues to demonstrate the following deficits muscle weakness and impaired timing and sequencing and decreased coordination and therefore will continue to benefit from skilled PT intervention to increase functional independence with mobility.  Patient progressing toward long term goals..  Continue plan of care.  PT Short Term Goals Week 1:  PT Short Term Goal 1 (Week 1): Pt will perform bed mobility without cues or assist from PT PT Short Term Goal 1 - Progress (Week 1): Met PT Short Term Goal 2 (Week 1): Pt will perform sit<>stand transfers with supervision assist consistently. PT Short Term Goal 2 - Progress (Week 1): Met PT Short Term Goal 3 (Week 1): Pt will perform stand pivot transfers with supervision assist with LRAD consistently PT Short Term Goal 3 - Progress (Week 1): Met PT Short Term Goal 4 (Week 1): Pt will ambulate 140f with supervision assist and LRAD   PT Short Term Goal 4 - Progress (Week 1): Met Week 2:  PT Short Term Goal 1 (Week 2): STG=LTG due to ELOS    Therapy Documentation Precautions:  Precautions Precautions: Fall Restrictions Weight Bearing Restrictions: No Vital Signs: Therapy Vitals Temp: 99 F (37.2  C) Temp Source: Oral Pulse Rate: (!) 47 (RN notified) Resp: 18 BP: (!) 111/52 (RN notified) Patient Position (if appropriate): Sitting Oxygen Therapy SpO2: 96 % O2 Device: Not Delivered   See Function Navigator for Current Functional Status.    [name] 07/01/2016, 3:36 PM

## 2016-07-01 NOTE — Progress Notes (Addendum)
Physical Therapy Session Note  Patient Details  Name: Donald Richardson MRN: [id number] Date of Birth: 1941/02/23  Today's Date: 07/01/2016 PT Individual Time: 1000-1100 and 1300-1415 PT Individual Time Calculation (min): 60 min and 75 min  Short Term Goals: Week 1:  PT Short Term Goal 1 (Week 1): Pt will perform bed mobility without cues or assist from PT PT Short Term Goal 2 (Week 1): Pt will perform sit<>stand transfers with supervision assist consistently. PT Short Term Goal 3 (Week 1): Pt will perform stand pivot transfers with supervision assist with LRAD consistently PT Short Term Goal 4 (Week 1): Pt will ambulate 199f with supervision assist and LRAD    Skilled Therapeutic Interventions/Progress Updates: Pt presented in w/c agreeable to therapy. Trial of SPC for ambulation, pt able to perform sit to stand from w/c with supervision demonstrating improved safety. Pt ambulated 1542fto rehab gym [facility] with cues for step length, increasing BOS and increasing foot clearance. Performed trunk flexion/ext with 1kn weighted ball with pt improving posture during activity. Performed sit to/from stand with 1LE on step holding un weighted ball with pt able to self correct posterior lean. Performed horse shoe toss with single foot on step L/R with no significant anterior wt shift. Use of agility ladder in hallway with pt side stepping over each rung then forward stepping over rungs for increasing step length. Throughout session pt demonstrated improving awareness of use of hand with transfers. Performed reaching during gait in hallway with SPLeominsterith min guard with pt demonstrating improving safety awareness and no LOB noted. Pt required occasional rest breaks however overall seems to be decreasing in frequency. Pt returned to room with RW due to fatigue requiring mod cues for increasing step length and returned to w/c at end of session.   Tx2:  Pt presented hand off from NT returning from  toilet. Transported to rehab gym for energy conservation. Performed standing dynamic arm reach with yoga block between feet to increase BOS. Pt able to tolerate approx 2 min of activity before requiring rest breaks. Pt performed x 3 bouts 2 min ea maintaining fair balance. Pt able to maintain increased BOS during activity. Transported outside where pt ambulated 4039f 4 and [id number] on uneven surfaces. Pt required min guard and cues for increasing step length which pt was able to perform but demonstrated difficulty with carryover. Pt did demonstrate improved consistency with hand placement during transfers throughout session. Pt returned to gym and performed NuStep L3 x 8 min for endurance and reciprocal movement. Pt returned to room and remained in w/c with needs met.      Therapy Documentation Precautions:  Precautions Precautions: Fall Restrictions Weight Bearing Restrictions: No General:   Vital Signs:  Pain: Pain Assessment Pain Assessment: No/denies pain Pain Score: 0-No pain   See Function Navigator for Current Functional Status.   Therapy/Group: Individual Therapy  Adalene Gulotta  Aniston Christman, PTA  07/01/2016, 12:43 PM

## 2016-07-01 NOTE — Progress Notes (Signed)
Occupational Therapy Session Note  Patient Details  Name: Donald Richardson MRN: [id number] Date of Birth: 01/02/1941  Today's Date: 07/01/2016 OT Individual Time: 2620-3559 OT Individual Time Calculation (min): 75 min    Short Term Goals: Week 1:  OT Short Term Goal 1 (Week 1): Pt will complete toileting task with supervision OT Short Term Goal 2 (Week 1): Pt will complete full dressing task with superviison/ set-up assist OT Short Term Goal 3 (Week 1): Will initiate IADL re-training OT Short Term Goal 4 (Week 1): Pt will ambulate into bathroom in prep for showering/ toileting tasks with CGA  Skilled Therapeutic Interventions/Progress Updates:    Pt seen for OT session focusing on ADL re-training and education. Pt asleep in supine upon arrival, easily awoken and agreeable to tx session. He ate breakfast seated EOB, able to complete set-up and locate all items on meal tray independently. While eating, discussed at length d/c planning, pt's current level of function and fall risk. Pt not feeling as if he was ready for d/c home due to impaired balance. Reviewed pt's high fall risk and need to call for assist for mobility/ toileting needs.  He ambulated into bathroom with CGA using RW and VCs for RW management over toilet.  He declined bathing task this morning, opting to shave and change clohtes. He ambulated with RW to gather clothing items. Grooming tasks completed in standing with pt tolerating ~10 minutes in standing. Pt able to occlude vision during grooming tasks and maintain balance. He returned to EOB for seated rest break and dressed with increased time. VCs for safety awareness not to attempt standing to doff shirt while standing in standard socks due to fall risk.  Pt left seated in w/c at end of session, all needs in reach.    Therapy Documentation Precautions:  Precautions Precautions: Fall Restrictions Weight Bearing Restrictions: No Pain:   no/denies pain  See Function  Navigator for Current Functional Status.   Therapy/Group: Individual Therapy  Lewis, Catalaya Garr C 07/01/2016, 7:05 AM

## 2016-07-02 ENCOUNTER — Inpatient Hospital Stay (HOSPITAL_COMMUNITY): Payer: Medicare Other

## 2016-07-02 ENCOUNTER — Inpatient Hospital Stay (HOSPITAL_COMMUNITY): Payer: Medicare Other | Admitting: Physical Therapy

## 2016-07-02 ENCOUNTER — Inpatient Hospital Stay (HOSPITAL_COMMUNITY): Payer: Medicare Other | Admitting: Occupational Therapy

## 2016-07-02 NOTE — Patient Care Conference (Signed)
Inpatient RehabilitationTeam Conference and Plan of Care Update Date: 07/02/2016   Time: 10:30 AM    Patient Name: Donald Richardson      Medical Record Number: [id number]  Date of Birth: [birth year] Sex: Male         Room/Bed: 4M06C/4M06C-01 Payor Info: Payor: MEDICARE / Plan: MEDICARE PART A AND B / Product Type: *No Product type* /    Admitting Diagnosis: CVA  Admit Date/Time:  06/24/2016  6:04 PM Admission Comments: No comment available   Primary Diagnosis:  <principal problem not specified> Principal Problem: <principal problem not specified>  Patient Active Problem List   Diagnosis Date Noted  . Small vessel disease, cerebrovascular 06/24/2016  . Left hemiparesis ([place])   . Thrombocytopenia ([place])   . Acute on chronic combined systolic and diastolic CHF (congestive heart failure) ([place])   . RBBB 06/21/2016  . CKD (chronic kidney disease), stage III 06/21/2016  . Basal ganglia infarction ([place]) 06/20/2016  . H/O cataract extraction 06/26/2014  . COLD (chronic obstructive lung disease) ([place]) 02/27/2014  . Essential hypertension 06/16/2012  . Diabetes mellitus type 2, controlled, with complications ([place]) 61/60/7371  . Hyperlipidemia associated with type 2 diabetes mellitus ([place]) 08/14/2008  . TOBACCO ABUSE 08/14/2008  . CAD, NATIVE VESSEL 08/14/2008  . Bradycardia 08/14/2008    Expected Discharge Date: Expected Discharge Date: 07/09/16  Team Members Present: Physician leading conference: Dr. Alysia Penna Social Worker Present: Ovidio Kin, LCSW Nurse Present: Other (comment) Jonna Clark) PT Present: Barrie Folk, PT OT Present: Napoleon Form, OT SLP Present: Stormy Fabian, SLP PPS Coordinator present : Daiva Nakayama, RN, CRRN     Current Status/Progress Goal Weekly Team Focus  Medical   Bradycardia, persistent left-sided weakness  Maintain medical stability, reduce fall risk  Continue rehabilitation program   Bowel/Bladder   continent of bowel and bladder  continued  improvement with ambulation and independence  strenghening and increased independence with mobility   Swallow/Nutrition/ Hydration             ADL's   Supervision- min A overall  Mod I overall  ADL/ IADL re-training; education; safety awareness; d/c planning   Mobility   Supervision gait with RW, minA with SPC, supervision with transfers, improving endurance   Modified independent with all mobility including bed mobility, transfers, gait, stairs  endurance, balance, gait    Communication             Safety/Cognition/ Behavioral Observations            Pain   pt deines pain  continued improvement without pain  continued improvement for discharge to home   Skin   pt continues to ha ve dry skin with little relief  improvement with dry skin  introduce a more mosituring cream to pt skin careroutine to improve current condition      *See Care Plan and progress notes for long and short-term goals.  Barriers to Discharge: Needs to be at modified independent level    Possible Resolutions to Barriers:  Continue therapy, ensure goals are at mod I    Discharge Planning/Teaching Needs:  Home with neighbors and friends coming in to check on him and provide transportation to appointments-brother involved but lives in [place]      Team Discussion:  Making progress toward his goals of mod/I level. Currently min-CGA level. Becomes fatigued after 125 ft ambulating with rolling walker. Motor planning issues and needing to be reminded about foot clearance. SP-DC. MD to speak with about not driving  when discharged from CIR  Revisions to Treatment Plan:  DC 5/9   Continued Need for Acute Rehabilitation Level of Care: The patient requires daily medical management by a physician with specialized training in physical medicine and rehabilitation for the following conditions: Daily direction of a multidisciplinary physical rehabilitation program to ensure safe treatment while eliciting the highest  outcome that is of practical value to the patient.: Yes Daily medical management of patient stability for increased activity during participation in an intensive rehabilitation regime.: Yes  Santoria Chason, Gardiner Rhyme 07/02/2016, 2:26 PM

## 2016-07-02 NOTE — Progress Notes (Signed)
Physical Therapy Session Note  Patient Details  Name: Donald Richardson MRN: [id number] Date of Birth: 05/11/1940  Today's Date: 07/02/2016 PT Individual Time: 0930-1030 PT Individual Time Calculation (min): 60 min   Short Term Goals: Week 1:  PT Short Term Goal 1 (Week 1): Pt will perform bed mobility without cues or assist from PT PT Short Term Goal 1 - Progress (Week 1): Met PT Short Term Goal 2 (Week 1): Pt will perform sit<>stand transfers with supervision assist consistently. PT Short Term Goal 2 - Progress (Week 1): Met PT Short Term Goal 3 (Week 1): Pt will perform stand pivot transfers with supervision assist with LRAD consistently PT Short Term Goal 3 - Progress (Week 1): Met PT Short Term Goal 4 (Week 1): Pt will ambulate 141f with supervision assist and LRAD   PT Short Term Goal 4 - Progress (Week 1): Met Week 2:  PT Short Term Goal 1 (Week 2): STG=LTG due to ELOS  Skilled Therapeutic Interventions/Progress Updates:    Pt received sitting in WC and agreeable to PT  Gait in hall with RW x 1551fwith supervision assist. Pt noted to have decreased foot clearance on the LLE as gait progressed.   NMR with gait using tband to promote hip knee flexion x 15072fGait following gait x 2f62fth tband noted improvement in foot clearance though increased hip and knee flexion.  Gait on unlevel cement sidewalk 125ft35fh RW and supervision assist progressing to min assist for last 10ft.53ftional  75ft w38fsupervision assist. Noted decrease in step length and height with increased distance on the LLE.   WC propulsion with BLE technique to re-enforce reciprocal movement patterns and facilitate increased hip flexion with on the L. Pt noted to be externally distracted throughout WC [facility]ty decreasing speed and limiting effectiveness of reciprocal movement training.   Patient returned too room and left sitting in WC [facility] bell in reach and all needs met.        Therapy  Documentation Precautions:  Precautions Precautions: Fall Restrictions Weight Bearing Restrictions: No   Pain: 0/10   See Function Navigator for Current Functional Status.   Therapy/Group: Individual Therapy  Donald Richardson [name]18, 11:02 AM

## 2016-07-02 NOTE — Progress Notes (Signed)
Subjective/Complaints:  revewed EKGs from April 2018 and May 2017, all have demonstrate sinus brady with rate from 37-55bpm  Pt incont of bladder  ROS-  Neg N/V/D.  No CP or SOB  Objective: Vital Signs: Blood pressure (!) 128/58, pulse (!) 44, temperature 98.9 F (37.2 C), temperature source Oral, resp. rate 16, weight 67.4 kg (148 lb 9.6 oz), SpO2 99 %. No results found. Results for orders placed or performed during the hospital encounter of 06/24/16 (from the past 72 hour(s))  Creatinine, serum     Status: Abnormal   Collection Time: 07/01/16  5:00 AM  Result Value Ref Range   Creatinine, Ser 1.43 (H) 0.61 - 1.24 mg/dL   GFR calc non Af Amer 47 (L) >60 mL/min   GFR calc Af Amer 54 (L) >60 mL/min    Comment: (NOTE) The eGFR has been calculated using the CKD EPI equation. This calculation has not been validated in all clinical situations. eGFR's persistently <60 mL/min signify possible Chronic Kidney Disease.      HEENT: facial eczema Cardio: irreg irreg no murmur Resp: CTA B/L and unlabored GI: BS positive and NT, ND Extremity:  No Edema Skin:   Rash dry erythemous plaques  Neuro: Alert/Oriented, Normal Sensory, Abnormal Motor visual fields intact, Abnormal FMC Ataxic/ dec FMC and Other no evidence of neglect. Left side 4+/5 UE and LE, 5/5 on RIght Musc/Skel:  Other no pain with UE or LE ROM Gen NAD   Assessment/Plan: 1. Functional deficits secondary to RIght Caudate infarct which require 3+ hours per day of interdisciplinary therapy in a comprehensive inpatient rehab setting. Physiatrist is providing close team supervision and 24 hour management of active medical problems listed below. Physiatrist and rehab team continue to assess barriers to discharge/monitor patient progress toward functional and medical goals. FIM: Function - Bathing Position: Shower Body parts bathed by patient: Right arm, Left upper leg, Left arm, Right lower leg, Chest, Abdomen, Left lower  leg, Front perineal area, Buttocks, Right upper leg, Back Body parts bathed by helper: Back Assist Level: Set up Set up : To obtain items  Function- Upper Body Dressing/Undressing What is the patient wearing?: Pull over shirt/dress Pull over shirt/dress - Perfomed by patient: Thread/unthread right sleeve, Thread/unthread left sleeve, Put head through opening, Pull shirt over trunk Pull over shirt/dress - Perfomed by helper: Pull shirt over trunk Assist Level: More than reasonable time Set up : To obtain clothing/put away Function - Lower Body Dressing/Undressing What is the patient wearing?: Pants, Underwear, Socks, Shoes Position: Sitting EOB Underwear - Performed by patient: Thread/unthread right underwear leg, Thread/unthread left underwear leg, Pull underwear up/down Pants- Performed by patient: Thread/unthread right pants leg, Thread/unthread left pants leg, Pull pants up/down, Fasten/unfasten pants Pants- Performed by helper: Fasten/unfasten pants Non-skid slipper socks- Performed by helper: Don/doff right sock, Don/doff left sock Socks - Performed by patient: Don/doff right sock, Don/doff left sock Shoes - Performed by patient: Don/doff right shoe, Don/doff left shoe Assist for footwear: Supervision/touching assist Assist for lower body dressing: Supervision or verbal cues  Function - Toileting Toileting steps completed by patient: Adjust clothing prior to toileting, Performs perineal hygiene, Adjust clothing after toileting Toileting steps completed by helper: Adjust clothing prior to toileting, Performs perineal hygiene, Adjust clothing after toileting Assist level: More than reasonable time  Function - Air cabin crew transfer assistive device: Grab bar, Walker Assist level to toilet: Supervision or verbal cues Assist level from toilet: Supervision or verbal cues  Function - Chair/bed transfer  Chair/bed transfer method: Stand pivot Chair/bed transfer assist level:  Supervision or verbal cues Chair/bed transfer assistive device: Armrests, Walker Chair/bed transfer details: Tactile cues for initiation, Tactile cues for sequencing, Tactile cues for placement, Tactile cues for weight bearing  Function - Locomotion: Wheelchair Will patient use wheelchair at discharge?: No Type: Manual Max wheelchair distance: 111f  Assist Level: Supervision or verbal cues Assist Level: Supervision or verbal cues Wheel 150 feet activity did not occur: Refused Assist Level: Supervision or verbal cues Turns around,maneuvers to table,bed, and toilet,negotiates 3% grade,maneuvers on rugs and over doorsills: No Function - Locomotion: Ambulation Assistive device: Cane-straight Max distance: 150 Assist level: Touching or steadying assistance (Pt > 75%) Assist level: Touching or steadying assistance (Pt > 75%) Assist level: Touching or steadying assistance (Pt > 75%) Walk 150 feet activity did not occur: Safety/medical concerns Assist level: Touching or steadying assistance (Pt > 75%) Walk 10 feet on uneven surfaces activity did not occur:  (with RW ) Assist level: Moderate assist (Pt 50 - 74%)  Function - Comprehension Comprehension: Auditory Comprehension assistive device: Hearing aids Comprehension assist level: Follows basic conversation/direction with no assist  Function - Expression Expression: Verbal Expression assist level: Expresses basic needs/ideas: With no assist  Function - Social Interaction Social Interaction assist level: Interacts appropriately with others - No medications needed.  Function - Problem Solving Problem solving assist level: Solves basic 90% of the time/requires cueing < 10% of the time  Function - Memory Memory assist level: Recognizes or recalls 90% of the time/requires cueing < 10% of the time Patient normally able to recall (first 3 days only): Current season, Location of own room, That he or she is in a hospital   Medical  Problem List and Plan: 1. Left-sided weaknesssecondary to Acute right caudate infarct likely secondary to small vessel disease -CIR PT, OT, SLP,  Team conference today please see physician documentation under team conference tab, met with team face-to-face to discuss problems,progress, and goals. Formulized individual treatment plan based on medical history, underlying problem and comorbidities. Tent d/c 5/9 will revisit -Parkinsonian features may be related to lesion location , no restng tremor, no rigidity 2. DVT Prophylaxis/Anticoagulation: Subcutaneous Lovenox. Monitor platelet counts No signs of bleeding 3. Pain Management: Tylenol as needed 4. Mood: Provide emotional support 5. Neuropsych: This patient iscapable of making decisions on hisown behalf. 6. Skin/Wound Care: Routine skin checks 7. Fluids/Electrolytes/Nutrition: Routine I&O, normal BUN, creat mildly elevated 8.Arteriosclerotic heart disease with stenting. Continue aspirin and Plavix 9.Diet-controlled diabetes mellitus. Hemoglobin A1c 5.8. CBG checks discontinued 10.History of hypertension. Permissive hypertension. Resume lisinopril 10 mg daily as needed Vitals:   07/01/16 1443 07/02/16 0510  BP: (!) 111/52 (!) 128/58  Pulse: (!) 47 (!) 44  Resp: 18 16  Temp: 99 F (37.2 C) 98.9 F (37.2 C)   11.Tobacco abuse. Nicoderm patch. Provide counseling 12.Hyperlipidemia. Crestor 13.History of Thrombocytopenia. 117,000. Follow-up plt stable at 106K  15.  Hypoalbuminemia- prostat 16.  Hx CAD s/p stent on chronic ASA and Plavix but had small vessel infarct nevertheless, heavy smoker, discussed with cardiology 17.  CKD, no nephrotoxic meds, will monitor stable BMP Latest Ref Rng & Units 07/01/2016 06/25/2016 06/23/2016  Glucose 65 - 99 mg/dL - 129(H) 92  BUN 6 - 20 mg/dL - 20 15  Creatinine 0.61 - 1.24 mg/dL 1.43(H) 1.40(H) 1.25(H)  Sodium 135 - 145 mmol/L - 139 139  Potassium 3.5 - 5.1 mmol/L -  4.1 3.6  Chloride 101 - 111 mmol/L - 105  107  CO2 22 - 32 mmol/L - 27 26  Calcium 8.9 - 10.3 mg/dL - 8.6(L) 8.6(L)  18.  Poor toenail hygiene borderline diabetic needs podiatry f/u as OP 19.  Right hand resting tremor, father had Parkinson's disease, pt has had tremor for 76yr not evaled by neurology 20.  Bradycardia not on BB, asymptomatic will monitor LOS (Days) 8 A FACE TO FACE EVALUATION WAS PERFORMED  Donald Richardson 07/02/2016, 8:03 AM

## 2016-07-02 NOTE — Progress Notes (Addendum)
Occupational Therapy Weekly Progress Note  Patient Details  Name: Donald Richardson MRN: [id number] Date of Birth: 1940-07-22  Beginning of progress report period: June 25, 2016 End of progress report period: Jul 02, 2016  Today's Date: 07/02/2016 OT Individual Time: 9528-4132 and 1400-1500 OT Individual Time Calculation (min): 60 min and 60 min   Patient has met 4 of 4 short term goals.  Pt making excellent progress towards OT goals. Pt with much improved functional activity tolerance, though cont to require seated rest breaks throughout sessions. He cont to demonstrate poor safety awareness though he is aware of deficits and high fall risk.  Pt will cont to benefit from skilled OT therapy to increase independence and safety with ADLs and IADLs.   Patient continues to demonstrate the following deficits: muscle weakness, decreased cardiorespiratoy endurance and decreased standing balance, decreased postural control and decreased balance strategies and therefore will continue to benefit from skilled OT intervention to enhance overall performance with BADL, iADL and Reduce care partner burden.  Patient progressing toward long term goals..  Continue plan of care.  OT Short Term Goals Week 1:  OT Short Term Goal 1 (Week 1): Pt will complete toileting task with supervision OT Short Term Goal 1 - Progress (Week 1): Met OT Short Term Goal 2 (Week 1): Pt will complete full dressing task with superviison/ set-up assist OT Short Term Goal 2 - Progress (Week 1): Met OT Short Term Goal 3 (Week 1): Will initiate IADL re-training OT Short Term Goal 3 - Progress (Week 1): Met OT Short Term Goal 4 (Week 1): Pt will ambulate into bathroom in prep for showering/ toileting tasks with CGA OT Short Term Goal 4 - Progress (Week 1): Met Week 2:  OT Short Term Goal 1 (Week 2): STG=LTG due to LOS  Skilled Therapeutic Interventions/Progress Updates:    Session One: Pt seen for OT ADL bathing/dressing session. Pt  sitting up in w/c upon arrival with hand off from PT. Pt declining pain this morning, requesting to shower. He ambulated throughout room with RW to gather clothing items, VCs required throughout session for RW management during functional tasks.  Following seated rest break, pt ambulated into bathroom and bathe din seated position on tub bench with distant supervision. He returned to w/c to dress, VCs for safety awarenss including sitting to complete higher level balance tasks such as threading and adjusting pants/ suspenders. He completed grooming tasks standing at sink with distant supervision. Pt returned to w/c at end of session in prep for hand off to PT.   Session Two: Pt seen for OT session focusing on functional mobility and LE strengthening. Pt sitting up in w/c upon arrival, agreeable to tx session. He ambulated throughout session using RW with VCs throughout for upright posture, increased stride length, and less UE reliance on RW. In ADL apartment, completed bed mobility on standard bed at supervision level. VCs for instruction provided for log rolling technique to return to EOB for increased ease with task. Using hallway rail, pt completed side steps R>L and L>R with tactile cuing for upright posture and education regarding purpose of exercise and functional implications. He returned to therapy mat. Completed hip AB/ADduction exercises x2 trials of 5 reps of each side in sidelying position in "clam shell" exercise. Completed x10 glute bridge raises with verbal and tactile cuing for proper form and technique. Rest breaks required throughout. He returned to room, completed standing toileting task with supervision. He returned to supine at end of  session, left with all needs in reach and bed alarm on.   Therapy Documentation Precautions:  Precautions Precautions: Fall Restrictions Weight Bearing Restrictions: No Pain:   No/ denies pain  See Function Navigator for Current Functional  Status.   Therapy/Group: Individual Therapy  Lewis, Johnnie Goynes C 07/02/2016, 7:06 AM

## 2016-07-02 NOTE — Progress Notes (Signed)
Physical Therapy Note  Patient Details  Name: DEONE LEIFHEIT MRN: [id number] Date of Birth: Feb 19, 1941 Today's Date: 07/02/2016  0800-0830, 30 min individual tx Pain: none, per pt  Pt seated in w/c, pants down around his hips.  He stated that he had a bladder accident this morning, attempting to stand to urinate.  He was unable to find the call bell easily and had urgency.  PT educated pt on various lights on bed as well, which he can use to call staff.  Also reiterated fall risk and need for staff. Pt stood and managed pants and suspenders with supervision.   Education on self stretching L hamstrings and L heel cord, in sitting and standing, x 30 seconds x 3. Gait with RW in hall x 50' with 2 turns. Pt demonstrated crouched gait, very narrow BOs due to bil hip adduction, and tenuous L foot clearance due to limited L hip flexion, knee flex and ankle DF.  Pt left resting in w/c with all needs within reach.  See function navigator for current status.  Quintavius Niebuhr 07/02/2016, 7:50 AM

## 2016-07-02 NOTE — Progress Notes (Signed)
Social Work Patient ID: Donald Richardson, male   DOB: November 07, 1940, 76 y.o.   MRN: [id number]  Met with pt to discuss team conference goals mod/I and progressing toward these goals. He feels he is not doing as well as team feels he is doing. Discussed being able to ambulate 100-125 feet then becoming tired and dragging foot. He feels it would be faster than it is. He is glad to know the team feels he will be safe to be home Alone by next Wed. His brother is coming tomorrow will meet with him and answer his questions and address his concerns. Work on discharge needs for next week, meet with his brother tomorrow.

## 2016-07-02 NOTE — Progress Notes (Signed)
Social Work [name], LCSW Social Worker Signed   Patient Care Conference Date of Service: 07/02/2016  2:26 PM      Hide copied text Hover for attribution information Inpatient RehabilitationTeam Conference and Plan of Care Update Date: 07/02/2016   Time: 10:30 AM      Patient Name: Donald Richardson      Medical Record Number: [id number]  Date of Birth: 19-Aug-1940 Sex: Male         Room/Bed: 4M06C/4M06C-01 Payor Info: Payor: MEDICARE / Plan: MEDICARE PART A AND B / Product Type: *No Product type* /     Admitting Diagnosis: CVA  Admit Date/Time:  06/24/2016  6:04 PM Admission Comments: No comment available    Primary Diagnosis:  <principal problem not specified> Principal Problem: <principal problem not specified>       Patient Active Problem List    Diagnosis Date Noted  . Small vessel disease, cerebrovascular 06/24/2016  . Left hemiparesis ([place])    . Thrombocytopenia ([place])    . Acute on chronic combined systolic and diastolic CHF (congestive heart failure) ([place])    . RBBB 06/21/2016  . CKD (chronic kidney disease), stage III 06/21/2016  . Basal ganglia infarction ([place]) 06/20/2016  . H/O cataract extraction 06/26/2014  . COLD (chronic obstructive lung disease) ([place]) 02/27/2014  . Essential hypertension 06/16/2012  . Diabetes mellitus type 2, controlled, with complications ([place]) 69/45/0388  . Hyperlipidemia associated with type 2 diabetes mellitus ([place]) 08/14/2008  . TOBACCO ABUSE 08/14/2008  . CAD, NATIVE VESSEL 08/14/2008  . Bradycardia 08/14/2008      Expected Discharge Date: Expected Discharge Date: 07/09/16   Team Members Present: Physician leading conference: Dr. Alysia Penna Social Worker Present: Ovidio Kin, LCSW Nurse Present: Other (comment) Jonna Clark) PT Present: Barrie Folk, PT OT Present: Napoleon Form, OT SLP Present: Stormy Fabian, SLP PPS Coordinator present : Daiva Nakayama, RN, CRRN       Current Status/Progress Goal Weekly Team Focus    Medical     Bradycardia, persistent left-sided weakness  Maintain medical stability, reduce fall risk  Continue rehabilitation program   Bowel/Bladder     continent of bowel and bladder  continued improvement with ambulation and independence  strenghening and increased independence with mobility   Swallow/Nutrition/ Hydration               ADL's     Supervision- min A overall  Mod I overall  ADL/ IADL re-training; education; safety awareness; d/c planning   Mobility     Supervision gait with RW, minA with SPC, supervision with transfers, improving endurance   Modified independent with all mobility including bed mobility, transfers, gait, stairs  endurance, balance, gait    Communication               Safety/Cognition/ Behavioral Observations             Pain     pt deines pain  continued improvement without pain  continued improvement for discharge to home   Skin     pt continues to ha ve dry skin with little relief  improvement with dry skin  introduce a more mosituring cream to pt skin careroutine to improve current condition     *See Care Plan and progress notes for long and short-term goals.   Barriers to Discharge: Needs to be at modified independent level     Possible Resolutions to Barriers:  Continue therapy, ensure goals are at [facility] I     Discharge  Planning/Teaching Needs:  Home with neighbors and friends coming in to check on him and provide transportation to appointments-brother involved but lives in [place]      Team Discussion:  Making progress toward his goals of mod/I level. Currently min-CGA level. Becomes fatigued after 125 ft ambulating with rolling walker. Motor planning issues and needing to be reminded about foot clearance. SP-DC. MD to speak with about not driving when discharged from CIR  Revisions to Treatment Plan:  DC 5/9    Continued Need for Acute Rehabilitation Level of Care: The patient requires daily medical management by a physician with  specialized training in physical medicine and rehabilitation for the following conditions: Daily direction of a multidisciplinary physical rehabilitation program to ensure safe treatment while eliciting the highest outcome that is of practical value to the patient.: Yes Daily medical management of patient stability for increased activity during participation in an intensive rehabilitation regime.: Yes   Jabron Weese, Gardiner Rhyme 07/02/2016, 2:26 PM      [name], [place] Worker Signed   Patient Care Conference Date of Service: 06/25/2016  1:42 PM      Hide copied text Hover for attribution information Inpatient RehabilitationTeam Conference and Plan of Care Update Date: 06/25/2016   Time: 11:00 AM      Patient Name: Donald Richardson      Medical Record Number: [id number]  Date of Birth: 1941/03/02 Sex: Male         Room/Bed: 4M06C/4M06C-01 Payor Info: Payor: MEDICARE / Plan: MEDICARE PART A AND B / Product Type: *No Product type* /     Admitting Diagnosis: CVA  Admit Date/Time:  06/24/2016  6:04 PM Admission Comments: No comment available    Primary Diagnosis:  <principal problem not specified> Principal Problem: <principal problem not specified>       Patient Active Problem List    Diagnosis Date Noted  . Small vessel disease, cerebrovascular 06/24/2016  . Left hemiparesis ([place])    . Thrombocytopenia ([place])    . Acute on chronic combined systolic and diastolic CHF (congestive heart failure) ([place])    . RBBB 06/21/2016  . CKD (chronic kidney disease), stage III 06/21/2016  . Basal ganglia infarction ([place]) 06/20/2016  . H/O cataract extraction 06/26/2014  . COLD (chronic obstructive lung disease) ([place]) 02/27/2014  . Essential hypertension 06/16/2012  . Diabetes mellitus type 2, controlled, with complications ([place]) 01/75/1025  . Hyperlipidemia associated with type 2 diabetes mellitus ([place]) 08/14/2008  . TOBACCO ABUSE 08/14/2008  . CAD, NATIVE VESSEL 08/14/2008  . Bradycardia  08/14/2008      Expected Discharge Date: Expected Discharge Date: 07/09/16   Team Members Present: Physician leading conference: Dr. Alysia Penna Social Worker Present: Ovidio Kin, LCSW Nurse Present: Heather Roberts, RN PT Present: Phylliss Bob, PTA;Barrie Folk, PT OT Present: Napoleon Form, OT SLP Present: Stormy Fabian, SLP PPS Coordinator present : Daiva Nakayama, RN, CRRN       Current Status/Progress Goal Weekly Team Focus  Medical     chronic smoker, cont bowel and bladder  Mod i functional goals maintain med stability  initiate rehab program   Bowel/Bladder     continent of bowel & bladder, LBM 06/24/16  continue to be continent  continue to monitor & assist as needed   Swallow/Nutrition/ Hydration               ADL's     min-mod A overall  Mod I overall  ADL re-training; functional activity tolerance/ balance; functional transfers; safety  Mobility     Mod assist overall for transfers and gait without AD. min assist for WC mobility   Modified independent with all mobility including bed mobility, transfers, gait, stairs  endurance, L neuromotor control, balance, gait, safety with transfers.    Communication               Safety/Cognition/ Behavioral Observations             Pain     no c/o pain, has tylenol ordered prn  pain scale <3  continue to assess & treat as needed   Skin     multiple skin tags, spots, old lesion scars, loop recorder incision to left chest with gauze drsg, toenails are thick, mycotic & curling, no skin breakdown  no new areas of skin breakdown  assess q shift     *See Care Plan and progress notes for long and short-term goals.   Barriers to Discharge: needs to be Mod I     Possible Resolutions to Barriers:  cont rehab     Discharge Planning/Teaching Needs:    Home with intermittent assist from friends and neighbors. Needs to be mod/I level to return home safely.     Team Discussion:  Goals mod/I level, currently min assist and being  evaluated by therapy team. Leans forward due to balance issues, will work on this while here. Motor planning issues. Brother here from [place]  Revisions to Treatment Plan:  New evaluation DC target 5/9    Continued Need for Acute Rehabilitation Level of Care: The patient requires daily medical management by a physician with specialized training in physical medicine and rehabilitation for the following conditions: Daily direction of a multidisciplinary physical rehabilitation program to ensure safe treatment while eliciting the highest outcome that is of practical value to the patient.: Yes Daily medical management of patient stability for increased activity during participation in an intensive rehabilitation regime.: Yes Daily analysis of laboratory values and/or radiology reports with any subsequent need for medication adjustment of medical intervention for : Neurological problems   [name] 06/26/2016, 3:47 PM       Patient ID: Theresa Mulligan, male   DOB: [birth year], 76 y.o.   MRN: [id number]

## 2016-07-03 ENCOUNTER — Inpatient Hospital Stay (HOSPITAL_COMMUNITY): Payer: Medicare Other | Admitting: Occupational Therapy

## 2016-07-03 ENCOUNTER — Inpatient Hospital Stay (HOSPITAL_COMMUNITY): Payer: Medicare Other | Admitting: Physical Therapy

## 2016-07-03 NOTE — Progress Notes (Signed)
Physical Therapy Session Note  Patient Details  Name: Donald Richardson MRN: [id number] Date of Birth: December 17, 1940  Today's Date: 07/03/2016 PT Individual Time: 0905-1005 PT Individual Time Calculation (min): 60 min   Short Term Goals: Week 2:  PT Short Term Goal 1 (Week 2): STG=LTG due to ELOS  Skilled Therapeutic Interventions/Progress Updates:   Pt received sitting in [facility] and agreeable to PT  PT transported pt to rehab gym in [facility] for time management and energy conservation.  Gait training without AD with min assist 2x 25f with stepping over obstacles 2 inch obstacles on second bout leading with LLE. Pt noted to demonstrate adequate foot clearance with stepping over obstacles with moderate carryover without obstacle present.  Additional gait training through hall with RW and supervision assist from PT x 1623fonly min cues for attention to task and increased hip/knee flexion. Ascend/descend 8 stairs (6inch) with 1 rail on R and supervision assist from PT.   Mini lunge while stepping over 1 inch hockey stick x 6 BLE with min assist from PT. Min verbal and tactile cues for improved weight shifting over the RLE to allow increased step height on the LLE.   Variable gait training inclduing forward/backward and side stepping,UE support progressing on rail to no UE support from PT. Min-supervision assist from PT as well as min cues for posture. All completed 3 x15 feet in each direction.    Laundry management with supervisoin assist from PT and 1-2 UE support during functional  Reaching to place clothes in dryer from Washing machine.   Pt able to inform PT for need to urinate and ambulate to bathroom with RW and Supervision assist. No added assist for safety in bathroom. Following toileting, patient returned too [facility] and all needs met.           Therapy Documentation Precautions:  Precautions Precautions: Fall Restrictions Weight Bearing  Restrictions: No Pain: Pain Assessment Pain Assessment: No/denies pain Pain Score: 0-No pain   See Function Navigator for Current Functional Status.   Therapy/Group: Individual Therapy  [name]/05/2016, 11:09 AM

## 2016-07-03 NOTE — Progress Notes (Signed)
Occupational Therapy Session Note  Patient Details  Name: Donald Richardson MRN: [id number] Date of Birth: 1940/11/19  Today's Date: 07/03/2016 OT Individual Time: 4076-8088 and 1335-1500 OT Individual Time Calculation (min): 60 min and 85 min   Short Term Goals: Week 2:  OT Short Term Goal 1 (Week 2): STG=LTG due to LOS  Skilled Therapeutic Interventions/Progress Updates:    Session One: Pt seen for OT session focusing on ADL re-training and functional activity tolerance. Pt sitting up in w/c upon arrival, agreeable to tx session. He declined bathing/dressing this morning but desiring to don socks and shave. He donned non-skid slipper socks with significantly increased time. He ambulated to sink with RW and completed standing shaving task with distant supervision. He tolerated ~5 minutes of standing before requesting need for seated rest break and completed remained of task in seated position mod I.   He ambulated with RW to gather dirty clothing items in prep for laundry task. He was able to bend over to retrieve items from lower drawer with supervision, reaching for weighted items and across midline.  He was taken to pt laundry facility total A in w/c for time and energy conservation. He completed laundry task at [facility] level at supervision- mod I level, demonstrated good safety awareness of making 2 trips to complete laundry task vs. One very large load. Pt returned to room at end of session, left seated in w/c with all needs in reach.   Session Two: Pt seen for OT session focusing on IADL goals and re-training. Pt sitting up in w/c upon arrival, agreeable to tx session.  He ambulated with supervision and increased time to pt laundry facility, one seated rest break required with VCs for L foot clearance when fatigued. He completed laundry task initially in standing, and then completed unloading clothes from seated position for energy conservation.  He returned to room in w/c and then stood at EOB to  fold/ sort clean laundry and then place in drawers. Seated rest breaks required throughout due to decreased standing/ activity tolerance. During rest breaks, discussed/ educated regarding d/c planning, return to activities, energy conservation, safety awareness, etc. He then completed simulated simple meal prep activity in ADL apartment. Cooking task completed at microwave level as pt either eats out or eats frozen meals. Completed at [place] I level with education provided throughout about carrying/ transporting items while using RW and recommendations for kitchen set-up for increased accessibility. Throughout session, pt required VCs for RW management in functional context as well as hand placement during sit <> Stand.  Pt returned to room at end of session, left seated in w/c with all needs in reach.   Therapy Documentation Precautions:  Precautions Precautions: Fall Restrictions Weight Bearing Restrictions: No Pain:   No/ denies pain  See Function Navigator for Current Functional Status.   Therapy/Group: Individual Therapy  Lewis, Geary Rufo C 07/03/2016, 7:08 AM

## 2016-07-03 NOTE — Progress Notes (Signed)
Subjective/Complaints:   Pt feels very unsteady in am, discussed no driving  ROS-  Neg N/V/D.  No CP or SOB  Objective: Vital Signs: Blood pressure 137/67, pulse 80, temperature 98.3 F (36.8 C), temperature source Oral, resp. rate 17, weight 69 kg (152 lb 3.2 oz), SpO2 93 %. No results found. Results for orders placed or performed during the hospital encounter of 06/24/16 (from the past 72 hour(s))  Creatinine, serum     Status: Abnormal   Collection Time: 07/01/16  5:00 AM  Result Value Ref Range   Creatinine, Ser 1.43 (H) 0.61 - 1.24 mg/dL   GFR calc non Af Amer 47 (L) >60 mL/min   GFR calc Af Amer 54 (L) >60 mL/min    Comment: (NOTE) The eGFR has been calculated using the CKD EPI equation. This calculation has not been validated in all clinical situations. eGFR's persistently <60 mL/min signify possible Chronic Kidney Disease.      HEENT: facial eczema Cardio: irreg irreg no murmur Resp: CTA B/L and unlabored GI: BS positive and NT, ND Extremity:  No Edema Skin:   Rash dry erythemous plaques  Neuro: Alert/Oriented, Normal Sensory, Abnormal Motor visual fields intact, Abnormal FMC Ataxic/ dec FMC and Other no evidence of neglect. Left side 4+/5 UE and LE, 5/5 on RIght Musc/Skel:  Other no pain with UE or LE ROM Gen NAD   Assessment/Plan: 1. Functional deficits secondary to RIght Caudate infarct which require 3+ hours per day of interdisciplinary therapy in a comprehensive inpatient rehab setting. Physiatrist is providing close team supervision and 24 hour management of active medical problems listed below. Physiatrist and rehab team continue to assess barriers to discharge/monitor patient progress toward functional and medical goals. FIM: Function - Bathing Position: Shower Body parts bathed by patient: Right arm, Left upper leg, Left arm, Right lower leg, Chest, Abdomen, Left lower leg, Front perineal area, Buttocks, Right upper leg, Back Body parts bathed by  helper: Back Assist Level: Supervision or verbal cues Set up : To obtain items  Function- Upper Body Dressing/Undressing What is the patient wearing?: Pull over shirt/dress Pull over shirt/dress - Perfomed by patient: Thread/unthread right sleeve, Thread/unthread left sleeve, Put head through opening, Pull shirt over trunk Pull over shirt/dress - Perfomed by helper: Pull shirt over trunk Assist Level: More than reasonable time Set up : To obtain clothing/put away Function - Lower Body Dressing/Undressing What is the patient wearing?: Pants, Underwear, Socks, Shoes Position: Wheelchair/chair at sink Underwear - Performed by patient: Thread/unthread right underwear leg, Thread/unthread left underwear leg, Pull underwear up/down Pants- Performed by patient: Thread/unthread right pants leg, Thread/unthread left pants leg, Pull pants up/down, Fasten/unfasten pants Pants- Performed by helper: Fasten/unfasten pants Non-skid slipper socks- Performed by helper: Don/doff right sock, Don/doff left sock Socks - Performed by patient: Don/doff right sock, Don/doff left sock Shoes - Performed by patient: Don/doff right shoe, Don/doff left shoe Assist for footwear: Setup Assist for lower body dressing: Supervision or verbal cues  Function - Toileting Toileting steps completed by patient: Adjust clothing prior to toileting, Performs perineal hygiene, Adjust clothing after toileting Toileting steps completed by helper: Adjust clothing prior to toileting, Performs perineal hygiene, Adjust clothing after toileting Assist level: Supervision or verbal cues  Function - Air cabin crew transfer assistive device: Grab bar, Walker Assist level to toilet: Supervision or verbal cues Assist level from toilet: Supervision or verbal cues  Function - Chair/bed transfer Chair/bed transfer method: Stand pivot Chair/bed transfer assist level: Supervision or verbal  cues Chair/bed transfer assistive device:  Walker, Armrests Chair/bed transfer details: Verbal cues for technique  Function - Locomotion: Wheelchair Will patient use wheelchair at discharge?: No Type: Manual Max wheelchair distance: 148f  Assist Level: Supervision or verbal cues Assist Level: Supervision or verbal cues Wheel 150 feet activity did not occur: Refused Assist Level: Supervision or verbal cues Turns around,maneuvers to table,bed, and toilet,negotiates 3% grade,maneuvers on rugs and over doorsills: No Function - Locomotion: Ambulation Assistive device: Walker-rolling Max distance: 1524f Assist level: Supervision or verbal cues Assist level: Supervision or verbal cues Assist level: Supervision or verbal cues Walk 150 feet activity did not occur: Safety/medical concerns Assist level: Supervision or verbal cues Walk 10 feet on uneven surfaces activity did not occur:  (with RW ) Assist level: Moderate assist (Pt 50 - 74%)  Function - Comprehension Comprehension: Auditory Comprehension assistive device: Hearing aids Comprehension assist level: Follows basic conversation/direction with no assist  Function - Expression Expression: Verbal Expression assist level: Expresses basic needs/ideas: With no assist  Function - Social Interaction Social Interaction assist level: Interacts appropriately with others - No medications needed.  Function - Problem Solving Problem solving assist level: Solves basic problems with no assist  Function - Memory Memory assist level: Recognizes or recalls 90% of the time/requires cueing < 10% of the time Patient normally able to recall (first 3 days only): Current season, Location of own room, That he or she is in a hospital   Medical Problem List and Plan: 1. Left-sided weaknesssecondary to Acute right caudate infarct likely secondary to small vessel disease -CIR PT, OT, SLP,   Tent d/c 5/9 still on target, discussed no driving -Parkinsonian features  may be related to lesion location , no restng tremor, no rigidity 2. DVT Prophylaxis/Anticoagulation: Subcutaneous Lovenox. Monitor platelet counts No signs of bleeding 3. Pain Management: Tylenol as needed 4. Mood: Provide emotional support 5. Neuropsych: This patient iscapable of making decisions on hisown behalf. 6. Skin/Wound Care: Routine skin checks 7. Fluids/Electrolytes/Nutrition: Routine I&O, normal BUN, creat mildly elevated 8.Arteriosclerotic heart disease with stenting. Continue aspirin and Plavix 9.Diet-controlled diabetes mellitus. Hemoglobin A1c 5.8. CBG checks discontinued 10.History of hypertension. Permissive hypertension. Resume lisinopril 10 mg daily as needed Vitals:   07/02/16 1505 07/03/16 0325  BP: 140/61 137/67  Pulse: (!) 42 80  Resp: 18 17  Temp: 98.2 F (36.8 C) 98.3 F (36.8 C)   11.Tobacco abuse. Nicoderm patch. Provide counseling 12.Hyperlipidemia. Crestor 13.History of Thrombocytopenia. 117,000. Follow-up plt stable at 106K  15.  Hypoalbuminemia- prostat 16.  Hx CAD s/p stent on chronic ASA and Plavix but had small vessel infarct nevertheless, heavy smoker, discussed with cardiology 17.  CKD, no nephrotoxic meds, will monitor stable BMP Latest Ref Rng & Units 07/01/2016 06/25/2016 06/23/2016  Glucose 65 - 99 mg/dL - 129(H) 92  BUN 6 - 20 mg/dL - 20 15  Creatinine 0.61 - 1.24 mg/dL 1.43(H) 1.40(H) 1.25(H)  Sodium 135 - 145 mmol/L - 139 139  Potassium 3.5 - 5.1 mmol/L - 4.1 3.6  Chloride 101 - 111 mmol/L - 105 107  CO2 22 - 32 mmol/L - 27 26  Calcium 8.9 - 10.3 mg/dL - 8.6(L) 8.6(L)  18.  Poor toenail hygiene borderline diabetic needs podiatry f/u as OP 19.  Right hand resting tremor, father had Parkinson's disease, pt has had tremor for 2067yrot evaled by neurology 20.  Bradycardia not on BB, asymptomatic will monitor LOS (Days) 9 A FACE TO FACE EVALUATION WAS PERFORMED  KIRAlysia Penna  E 07/03/2016, 8:00 AM

## 2016-07-03 NOTE — Progress Notes (Signed)
Social Work Patient ID: Donald Richardson, male   DOB: 1940-03-29, 76 y.o.   MRN: [id number]  Met with pt's brother-[name] to discuss team conference progress toward his goals of mod/I and target discharge date is still 5/9. He is here to meet with the gentleman who is putting up the bannister for the steps at pt's home. He will also get pt a tub bench, but will let this worker get either a rolling walker or cane, which ever is recommended. Discussed home health coverage and if plan doesn't work at home options of hiring assistance versus assisted living facilities. Will make sure brother has these lists prior to pt's discharge. Will work on discharge needs.

## 2016-07-04 ENCOUNTER — Inpatient Hospital Stay (HOSPITAL_COMMUNITY): Payer: Medicare Other | Admitting: Physical Therapy

## 2016-07-04 ENCOUNTER — Inpatient Hospital Stay (HOSPITAL_COMMUNITY): Payer: Medicare Other | Admitting: Occupational Therapy

## 2016-07-04 MED ORDER — AMMONIUM LACTATE 12 % EX LOTN
TOPICAL_LOTION | Freq: Two times a day (BID) | CUTANEOUS | Status: DC
  Administered 2016-07-04 – 2016-07-05 (×3): via TOPICAL
  Administered 2016-07-05 – 2016-07-06 (×2): 1 via TOPICAL
  Administered 2016-07-06 – 2016-07-08 (×5): via TOPICAL
  Administered 2016-07-09: 1 via TOPICAL
  Filled 2016-07-04: qty 222

## 2016-07-04 NOTE — Progress Notes (Signed)
Subjective/Complaints:   Slept well , aware of d/c date, brother assisting with home modification  ROS-  Neg N/V/D.  No CP or SOB  Objective: Vital Signs: Blood pressure 130/80, pulse 62, temperature 98 F (36.7 C), temperature source Oral, resp. rate 16, weight 69 kg (152 lb 3.2 oz), SpO2 95 %. No results found. No results found for this or any previous visit (from the past 72 hour(s)).   HEENT: facial eczema Cardio: irreg irreg no murmur Resp: CTA B/L and unlabored GI: BS positive and NT, ND Extremity:  No Edema Skin:   Rash dry erythemous plaques  Neuro: Alert/Oriented, Normal Sensory, Abnormal Motor visual fields intact, Abnormal FMC Ataxic/ dec FMC and Other no evidence of neglect. Left side 4+/5 UE and LE, 5/5 on RIght Musc/Skel:  Other no pain with UE or LE ROM Gen NAD   Assessment/Plan: 1. Functional deficits secondary to RIght Caudate infarct which require 3+ hours per day of interdisciplinary therapy in a comprehensive inpatient rehab setting. Physiatrist is providing close team supervision and 24 hour management of active medical problems listed below. Physiatrist and rehab team continue to assess barriers to discharge/monitor patient progress toward functional and medical goals. FIM: Function - Bathing Position: Shower Body parts bathed by patient: Right arm, Left upper leg, Left arm, Right lower leg, Chest, Abdomen, Left lower leg, Front perineal area, Buttocks, Right upper leg, Back Body parts bathed by helper: Back Assist Level: Supervision or verbal cues Set up : To obtain items  Function- Upper Body Dressing/Undressing What is the patient wearing?: Pull over shirt/dress Pull over shirt/dress - Perfomed by patient: Thread/unthread right sleeve, Thread/unthread left sleeve, Put head through opening, Pull shirt over trunk Pull over shirt/dress - Perfomed by helper: Pull shirt over trunk Assist Level: More than reasonable time Set up : To obtain clothing/put  away Function - Lower Body Dressing/Undressing What is the patient wearing?: Pants, Underwear, Socks, Shoes Position: Wheelchair/chair at sink Underwear - Performed by patient: Thread/unthread right underwear leg, Thread/unthread left underwear leg, Pull underwear up/down Pants- Performed by patient: Thread/unthread right pants leg, Thread/unthread left pants leg, Pull pants up/down, Fasten/unfasten pants Pants- Performed by helper: Fasten/unfasten pants Non-skid slipper socks- Performed by helper: Don/doff right sock, Don/doff left sock Socks - Performed by patient: Don/doff right sock, Don/doff left sock Shoes - Performed by patient: Don/doff right shoe, Don/doff left shoe Assist for footwear: Setup Assist for lower body dressing: Supervision or verbal cues  Function - Toileting Toileting steps completed by patient: Adjust clothing prior to toileting, Performs perineal hygiene, Adjust clothing after toileting Toileting steps completed by helper: Adjust clothing prior to toileting, Performs perineal hygiene, Adjust clothing after toileting Toileting Assistive Devices: Grab bar or rail Assist level: Touching or steadying assistance (Pt.75%)  Function - Air cabin crew transfer assistive device: Grab bar, Walker Assist level to toilet: Touching or steadying assistance (Pt > 75%) Assist level from toilet: Touching or steadying assistance (Pt > 75%)  Function - Chair/bed transfer Chair/bed transfer method: Stand pivot Chair/bed transfer assist level: Supervision or verbal cues Chair/bed transfer assistive device: Walker, Armrests Chair/bed transfer details: Verbal cues for technique  Function - Locomotion: Wheelchair Will patient use wheelchair at discharge?: No Type: Manual Max wheelchair distance: 161ft  Assist Level: Supervision or verbal cues Assist Level: Supervision or verbal cues Wheel 150 feet activity did not occur: Refused Assist Level: Supervision or verbal  cues Turns around,maneuvers to table,bed, and toilet,negotiates 3% grade,maneuvers on rugs and over doorsills: No Function -  Locomotion: Ambulation Assistive device: Walker-rolling Max distance: 152ft  Assist level: Supervision or verbal cues Assist level: Supervision or verbal cues Assist level: Supervision or verbal cues Walk 150 feet activity did not occur: Safety/medical concerns Assist level: Supervision or verbal cues Walk 10 feet on uneven surfaces activity did not occur:  (with RW ) Assist level: Supervision or verbal cues  Function - Comprehension Comprehension: Auditory Comprehension assistive device: Hearing aids Comprehension assist level: Follows complex conversation/direction with extra time/assistive device  Function - Expression Expression: Verbal Expression assist level: Expresses complex ideas: With no assist  Function - Social Interaction Social Interaction assist level: Interacts appropriately with others - No medications needed.  Function - Problem Solving Problem solving assist level: Solves basic problems with no assist  Function - Memory Memory assist level: More than reasonable amount of time Patient normally able to recall (first 3 days only): Current season, Location of own room, That he or she is in a hospital   Medical Problem List and Plan: 1. 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 DVT Prophylaxis/Anticoagulation: Subcutaneous Lovenox. Monitor platelet counts No signs of bleeding 3. Pain Management: Tylenol as needed 4. Mood: Provide emotional support 5. Neuropsych: This patient iscapable of making decisions on hisown behalf. 6. Skin/Wound Care: Routine skin checks 7.  Fluids/Electrolytes/Nutrition: Routine I&O, normal BUN, creat mildly elevated 8.Arteriosclerotic heart disease with stenting. Continue aspirin and Plavix 9.Diet-controlled diabetes mellitus. Hemoglobin A1c 5.8. CBG checks discontinued 10.History of hypertension. Permissive hypertension. Resume lisinopril 10 mg daily as needed Vitals:   07/03/16 1345 07/04/16 0531  BP: 131/60 130/80  Pulse: 60 62  Resp: 18 16  Temp: 98 F (36.7 C) 98 F (36.7 C)   11.Tobacco abuse. Nicoderm patch. Provide counseling 12.Hyperlipidemia. Crestor 13.History of Thrombocytopenia. 117,000. Follow-up plt stable at 106K  15.  Hypoalbuminemia- prostat 16.  Hx CAD s/p stent on chronic ASA and Plavix but had small vessel infarct nevertheless, heavy smoker, discussed with cardiology 17.  CKD, no nephrotoxic meds, will monitor stable BMP Latest Ref Rng & Units 07/01/2016 06/25/2016 06/23/2016  Glucose 65 - 99 mg/dL - 129(H) 92  BUN 6 - 20 mg/dL - 20 15  Creatinine 0.61 - 1.24 mg/dL 1.43(H) 1.40(H) 1.25(H)  Sodium 135 - 145 mmol/L - 139 139  Potassium 3.5 - 5.1 mmol/L - 4.1 3.6  Chloride 101 - 111 mmol/L - 105 107  CO2 22 - 32 mmol/L - 27 26  Calcium 8.9 - 10.3 mg/dL - 8.6(L) 8.6(L)  18.  Poor toenail hygiene borderline diabetic needs podiatry f/u as OP 19.  Right hand resting tremor, father had Parkinson's disease, pt has had tremor for 52yr, not evaled by neurology 20.  Bradycardia not on BB, asymptomatic will monitor LOS (Days) 10 A [place] E 07/04/2016, 7:33 AM

## 2016-07-04 NOTE — Progress Notes (Signed)
Occupational Therapy Session Note  Patient Details  Name: Donald Richardson MRN: [id number] Date of Birth: February 08, 1941  Today's Date: 07/04/2016 OT Individual Time: 0845-1000 and 1330-1430 OT Individual Time Calculation (min): 75 min and 60 min   Short Term Goals: Week 2:  OT Short Term Goal 1 (Week 2): STG=LTG due to LOS  Skilled Therapeutic Interventions/Progress Updates:    Session One: Pt seen for OT ADL bathing/dressing session. Pt sitting up in w/c upon arrival, agreeable to tx session and showering this morning, de denied pain. He ambulated throughout session using RW with supervision, occasional VCs required for RW management during functional tasks and hand placement during sit <> stand.  He bathed seated on tub bench with set-up assist to obtain items. He returned to w/c to dress with increased time He cont to require ret breaks throughout bathing/dressing sessions due to decreased activity tolerance.  He ambulated to sink and completed grooming tasks in standing in order to increase standing endurance and balance, completed with distant supervision. Following seated rest break, pt utilized hallway railings to complete standing standing hip ABduction swings for strengthening/ ROM. Completed x2 sets of 10.  Pt returned to w/c at end of session, left seated with all needs in reach.   Session Two: Pt seen for OT session focusing on functoinal ambulation and activity tolerance. Pt sitting up in w/c upon arrival, desired to go outside. He was taken in w/c total A for time and energy conservation. Outside, pt ambulated over outdoor patio area, in crowded environment and up/down slope. Multimodal cuing for L foot drag with pt responsding well to visual cues for step length. Seated rest breaks provided throughout on standard furntiure including park bench.All completed with supervision. He returned to unit, completed 5 minutes on NuStep level 3 for UE/LE strengthening/ endurance with goal of keeping  step speed >30 steps per minute. Pt returned to room at end of session, left seated in w/c with all needs in reach. Educated throughout session regarding energy conservation and d/c planning.    Therapy Documentation Precautions:  Precautions Precautions: Fall Restrictions Weight Bearing Restrictions: No Pain:  No/ denies pain  See Function Navigator for Current Functional Status.   Therapy/Group: Individual Therapy  Lewis, Sharee Sturdy C 07/04/2016, 7:09 AM

## 2016-07-04 NOTE — Progress Notes (Signed)
Physical Therapy Session Note  Patient Details  Name: Donald Richardson MRN: [id number] Date of Birth: Jul 23, 1940  Today's Date: 07/04/2016 PT Individual Time: 1515-1630 PT Individual Time Calculation (min): 75 min   Short Term Goals: Week 2:  PT Short Term Goal 1 (Week 2): STG=LTG due to ELOS  Skilled Therapeutic Interventions/Progress Updates:    Pt received sitting in WC and agreeable to PT  Pt transferred to main entrance of hospital in [facility]. Gait training with RW on uneven surface with supervision assist from PT. Pt noted to continue to require cues for increased step height after 113f. Dynamic gait training without AD forward/backward 2x 150f side stepping 2x 1560fait training at end of PT session over level and unlevel surface [id number] RW and supervision assist and noted improvement in step length and height.   4 square balance training for improved step length and height forward/backward, Left/right and diagonals, all completed 2 sets x 4. With min assist from PT. Pt noted to have improved step height and length in all directions with continued trials.   Bed mobility for sit<>supine x 3 to the L and x3 to the R with supervision assist progressing to no cues or assist from PT. PT instructed pt in problem solving to prevent incontinent episodes in the AM. Cues to reduced use of rails in bed to improve normalized motor plan and reduce effects of apraxia and motor planning deficits.   Pt left sitting in WC with call bell in reach and all needs met.         Therapy Documentation Precautions:  Precautions Precautions: Fall Restrictions Weight Bearing Restrictions: No Vital Signs: Therapy Vitals Temp: 98.1 F (36.7 C) Temp Source: Oral Pulse Rate: (!) 51 Resp: 17 BP: 104/89 Patient Position (if appropriate): Sitting Oxygen Therapy SpO2: 96 % O2 Device: Not Delivered Pain:   0/10   See Function Navigator for Current Functional Status.   Therapy/Group: Individual  Therapy  AustLorie Phenix/2018, 5:42 PM

## 2016-07-05 ENCOUNTER — Inpatient Hospital Stay (HOSPITAL_COMMUNITY): Payer: Medicare Other | Admitting: Occupational Therapy

## 2016-07-05 DIAGNOSIS — I2510 Atherosclerotic heart disease of native coronary artery without angina pectoris: Secondary | ICD-10-CM

## 2016-07-05 DIAGNOSIS — R001 Bradycardia, unspecified: Secondary | ICD-10-CM

## 2016-07-05 DIAGNOSIS — E119 Type 2 diabetes mellitus without complications: Secondary | ICD-10-CM

## 2016-07-05 DIAGNOSIS — D696 Thrombocytopenia, unspecified: Secondary | ICD-10-CM

## 2016-07-05 NOTE — Progress Notes (Signed)
Occupational Therapy Session Note  Patient Details  Name: Donald Richardson MRN: [id number] Date of Birth: Jan 15, 1941  Today's Date: 07/05/2016 OT Individual Time: 4103-0131 OT Individual Time Calculation (min): 73 min    Short Term Goals: Week 2:  OT Short Term Goal 1 (Week 2): STG=LTG due to LOS  Skilled Therapeutic Interventions/Progress Updates:    Upon entering the room, pt supine in bed but agreeable to OT intervention. Pt performed supine > wit with supervision and use of grab bar. Pt donning B socks and shoes with close supervision for safety with balance. Pt ambulating with RW to stand at sink for grooming tasks for 5 minutes . Pt performed toileting with overall supervision for clothing management and balance in standing. Pt ambulates to ADL apartment with RW 70' and supervision with min cues for posture and forward gaze. Pt taking rest break and OT providing stroke risk education and pt verbalizing understanding. Pt performed 2 sets of 10 chair push ups this session with rest breaks between each set and cues for pursed lip breathing. OT educated and demonstrated use of RW for shower transfer onto TTB in shower tub combination similar to home environment. Pt returning demonstration with min verbal cues for technique and supervision overall. OT also recommended purchase of safety treads to decrease fall risk. Pt returning to room at end of session and seated in wheelchair. Pt declined chair alarm. Call bell and all needed items within reach upon exiting the room.   Therapy Documentation Precautions:  Precautions Precautions: Fall Restrictions Weight Bearing Restrictions: No General:   Vital Signs:  Pain: Pain Assessment Pain Assessment: No/denies pain Pain Score: 0-No pain ADL:   Vision/Perception     Exercises:   Other Treatments:    See Function Navigator for Current Functional Status.   Therapy/Group: Individual Therapy  Gypsy Decant 07/05/2016, 12:22 PM

## 2016-07-05 NOTE — Progress Notes (Signed)
Subjective/Complaints: Pt seen laying in bed this AM.  He states she slept great overnight.  He denies complaints.   ROS: Denies N/V/D, CP or SOB  Objective: Vital Signs: Blood pressure 130/74, pulse (!) 50, temperature 98 F (36.7 C), temperature source Oral, resp. rate 18, weight 69 kg (152 lb 3.2 oz), SpO2 94 %. No results found. No results found for this or any previous visit (from the past 72 hour(s)).   HEENT: facial eczema. Atraumatic.  Cardio: irreg irreg no jvd Resp: CTA B/L and unlabored GI: BS positive, ND Skin:   Rash dry erythemous plaques. Intact. Neuro: Alert/Oriented,  Motor: LUE/LLE: 4+/5 UE and LE Musc/Skel:  No edema No tenderness. Gen NAD. Well-developed  Assessment/Plan: 1. Functional deficits secondary to RIght Caudate infarct which require 3+ hours per day of interdisciplinary therapy in a comprehensive inpatient rehab setting. Physiatrist is providing close team supervision and 24 hour management of active medical problems listed below. Physiatrist and rehab team continue to assess barriers to discharge/monitor patient progress toward functional and medical goals. FIM: Function - Bathing Position: Shower Body parts bathed by patient: Right arm, Left upper leg, Left arm, Right lower leg, Chest, Abdomen, Left lower leg, Front perineal area, Buttocks, Right upper leg, Back Body parts bathed by helper: Back Assist Level: Set up Set up : To obtain items  Function- Upper Body Dressing/Undressing What is the patient wearing?: Pull over shirt/dress Pull over shirt/dress - Perfomed by patient: Thread/unthread right sleeve, Thread/unthread left sleeve, Put head through opening, Pull shirt over trunk Pull over shirt/dress - Perfomed by helper: Pull shirt over trunk Assist Level: Set up Set up : To obtain clothing/put away Function - Lower Body Dressing/Undressing What is the patient wearing?: Pants, Underwear, Socks, Shoes Position: Wheelchair/chair at  sink Underwear - Performed by patient: Thread/unthread right underwear leg, Thread/unthread left underwear leg, Pull underwear up/down Pants- Performed by patient: Thread/unthread right pants leg, Thread/unthread left pants leg, Pull pants up/down, Fasten/unfasten pants Pants- Performed by helper: Fasten/unfasten pants Non-skid slipper socks- Performed by helper: Don/doff right sock, Don/doff left sock Socks - Performed by patient: Don/doff right sock, Don/doff left sock Shoes - Performed by patient: Don/doff right shoe, Don/doff left shoe Assist for footwear: Setup Assist for lower body dressing: Supervision or verbal cues  Function - Toileting Toileting steps completed by patient: Adjust clothing prior to toileting, Performs perineal hygiene, Adjust clothing after toileting Toileting steps completed by helper: Adjust clothing prior to toileting, Performs perineal hygiene, Adjust clothing after toileting Toileting Assistive Devices: Grab bar or rail Assist level: More than reasonable time  Function - Air cabin crew transfer assistive device: Grab bar, Walker Assist level to toilet: Supervision or verbal cues Assist level from toilet: Supervision or verbal cues  Function - Chair/bed transfer Chair/bed transfer method: Stand pivot Chair/bed transfer assist level: Supervision or verbal cues Chair/bed transfer assistive device: Environmental consultant, Armrests Chair/bed transfer details: Verbal cues for technique  Function - Locomotion: Wheelchair Will patient use wheelchair at discharge?: No Type: Manual Max wheelchair distance: 112ft  Assist Level: Supervision or verbal cues Assist Level: Supervision or verbal cues Wheel 150 feet activity did not occur: Refused Assist Level: Supervision or verbal cues Turns around,maneuvers to table,bed, and toilet,negotiates 3% grade,maneuvers on rugs and over doorsills: No Function - Locomotion: Ambulation Assistive device: Walker-rolling Max  distance: 215ft  Assist level: Supervision or verbal cues Assist level: Supervision or verbal cues Assist level: Supervision or verbal cues Walk 150 feet activity did not occur: Safety/medical concerns Assist  level: Supervision or verbal cues Walk 10 feet on uneven surfaces activity did not occur:  (with RW ) Assist level: Supervision or verbal cues  Function - Comprehension Comprehension: Auditory Comprehension assistive device: Hearing aids Comprehension assist level: Follows complex conversation/direction with extra time/assistive device  Function - Expression Expression: Verbal Expression assist level: Expresses complex ideas: With no assist  Function - Social Interaction Social Interaction assist level: Interacts appropriately with others - No medications needed.  Function - Problem Solving Problem solving assist level: Solves basic problems with no assist  Function - Memory Memory assist level: More than reasonable amount of time Patient normally able to recall (first 3 days only): Current season, Location of own room, That he or she is in a hospital   Medical Problem List and Plan: 1. 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  Parkinsonian features may be related to lesion location, no restng tremor, no rigidity 2. DVT Prophylaxis/Anticoagulation: Subcutaneous Lovenox. Monitor platelet counts No signs of bleeding 3. Pain Management: Tylenol as needed 4. Mood: Provide emotional support 5. Neuropsych: This patient iscapable of making decisions on hisown behalf. 6. Skin/Wound Care: Routine skin checks 7. Fluids/Electrolytes/Nutrition: Routine I&Os 8.Arteriosclerotic heart disease with stenting. Continue aspirin and Plavix 9.Diet-controlled diabetes mellitus. Hemoglobin A1c 5.8. CBG discontinued 10.History of hypertension. Resume lisinopril 10 mg daily as  needed Vitals:   07/04/16 1430 07/05/16 0531  BP: 104/89 130/74  Pulse: (!) 51 (!) 50  Resp: 17 18  Temp: 98.1 F (36.7 C) 98 F (36.7 C)   Controlled 5/5 11.Tobacco abuse. Nicoderm patch. Provide counseling 12.Hyperlipidemia. Crestor 13.History of Thrombocytopenia. 117,000. Follow-up plt stable at [facility] on 4/25 15.  Hypoalbuminemia- prostat 16.  Hx CAD s/p stent on chronic ASA and Plavix but had small vessel infarct nevertheless, heavy smoker, discussed with cardiology previously 17.  CKD, no nephrotoxic meds, will monitor stable BMP Latest Ref Rng & Units 07/01/2016 06/25/2016 06/23/2016  Glucose 65 - 99 mg/dL - 129(H) 92  BUN 6 - 20 mg/dL - 20 15  Creatinine 0.61 - 1.24 mg/dL 1.43(H) 1.40(H) 1.25(H)  Sodium 135 - 145 mmol/L - 139 139  Potassium 3.5 - 5.1 mmol/L - 4.1 3.6  Chloride 101 - 111 mmol/L - 105 107  CO2 22 - 32 mmol/L - 27 26  Calcium 8.9 - 10.3 mg/dL - 8.6(L) 8.6(L)  18.  Poor toenail hygiene borderline diabetic needs podiatry f/u as OP 19.  Right hand resting tremor, father had Parkinson's disease, pt has had tremor for 24yr, not evaled by neurology 20.  Bradycardia not on BB, asymptomatic will monitor  LOS (Days) 11 A FACE TO FACE EVALUATION WAS PERFORMED  Donald Richardson [name] 07/05/2016, 7:59 AM

## 2016-07-06 ENCOUNTER — Inpatient Hospital Stay (HOSPITAL_COMMUNITY): Payer: Medicare Other

## 2016-07-06 DIAGNOSIS — N183 Chronic kidney disease, stage 3 unspecified: Secondary | ICD-10-CM

## 2016-07-06 NOTE — Progress Notes (Signed)
Occupational Therapy Session Note  Patient Details  Name: Donald Richardson MRN: [id number] Date of Birth: 12/04/1940  Today's Date: 07/06/2016 OT Individual Time: 1100-1200 OT Individual Time Calculation (min): 60 min   Short Term Goals: Week 2:  OT Short Term Goal 1 (Week 2): STG=LTG due to LOS  Skilled Therapeutic Interventions/Progress Updates: ADL-retraining with focus on discharge planning, home safety recommendation, fall recovery (with brother present observing), dynamic standing balance and adapted bathing/dressing skills.   Pt received sitting at EOB in conversation with his brother, Kalman Nylen.   Brother requested assessment of pt's ability to recover from fall since he will be d/c'd home alone.   After setup with padded mat and initial instruction on fall recovery, pt returned demonstration with min instructional cues.  Recommend repeat of session to improve performance.   Pt then requested shower and change of clothing.   Without use of AD, pt ambulated to bathroom and required only setup assist with supplies to complete bathing, sitting and standing using tub bench, grab bar and hand shower.  Pt returned to his room to collect clothing from his dresser and then dressed sitting at EOB with intermittent setup to improve efficiency with time remaining.   Pt elected to groom at sink seated in his w/c through end of session while awaiting noon meal.   No LOB noted during session however pt required f/u with washing his buttocks thoroughly after OT noted residue on towel that pt sat on at EOB.        Therapy Documentation Precautions:  Precautions Precautions: Fall Restrictions Weight Bearing Restrictions: No   Pain: Pain Assessment Pain Score: 0-No pain    See Function Navigator for Current Functional Status.   Therapy/Group: Individual Therapy  [place] 07/06/2016, 12:16 PM

## 2016-07-06 NOTE — Progress Notes (Signed)
Subjective/Complaints: Pt seen laying in bed this AM.  He slept well overnight.  He is looking forward to being discharged later this week.   ROS: Denies N/V/D, CP or SOB  Objective: Vital Signs: Blood pressure 133/62, pulse 83, temperature 98.4 F (36.9 C), temperature source Oral, resp. rate 18, weight 69 kg (152 lb 3.2 oz), SpO2 97 %. No results found. No results found for this or any previous visit (from the past 72 hour(s)).   HEENT: facial eczema. Atraumatic.  Cardio:IRR no jvd Resp: CTA B/L and unlabored GI: BS positive, ND Skin:   Rash dry erythemous plaques. Intact. Neuro: Alert/Oriented,  Motor: LUE/LLE: 4+/5 UE and LE (stable) Musc/Skel:  No edema No tenderness. Gen NAD. Well-developed  Assessment/Plan: 1. Functional deficits secondary to RIght Caudate infarct which require 3+ hours per day of interdisciplinary therapy in a comprehensive inpatient rehab setting. Physiatrist is providing close team supervision and 24 hour management of active medical problems listed below. Physiatrist and rehab team continue to assess barriers to discharge/monitor patient progress toward functional and medical goals. FIM: Function - Bathing Position: Shower Body parts bathed by patient: Right arm, Left upper leg, Left arm, Right lower leg, Chest, Abdomen, Left lower leg, Front perineal area, Buttocks, Right upper leg, Back Body parts bathed by helper: Back Assist Level: Set up Set up : To obtain items  Function- Upper Body Dressing/Undressing What is the patient wearing?: Pull over shirt/dress Pull over shirt/dress - Perfomed by patient: Thread/unthread right sleeve, Thread/unthread left sleeve, Put head through opening, Pull shirt over trunk Pull over shirt/dress - Perfomed by helper: Pull shirt over trunk Assist Level: Set up Set up : To obtain clothing/put away Function - Lower Body Dressing/Undressing What is the patient wearing?: Pants, Underwear, Socks, Shoes Position:  Wheelchair/chair at sink Underwear - Performed by patient: Thread/unthread right underwear leg, Thread/unthread left underwear leg, Pull underwear up/down Pants- Performed by patient: Thread/unthread right pants leg, Thread/unthread left pants leg, Pull pants up/down, Fasten/unfasten pants Pants- Performed by helper: Fasten/unfasten pants Non-skid slipper socks- Performed by helper: Don/doff right sock, Don/doff left sock Socks - Performed by patient: Don/doff right sock, Don/doff left sock Shoes - Performed by patient: Don/doff right shoe, Don/doff left shoe Assist for footwear: Setup Assist for lower body dressing: Supervision or verbal cues  Function - Toileting Toileting steps completed by patient: Adjust clothing prior to toileting, Performs perineal hygiene, Adjust clothing after toileting Toileting steps completed by helper: Adjust clothing prior to toileting, Performs perineal hygiene, Adjust clothing after toileting Toileting Assistive Devices: Grab bar or rail Assist level: Supervision or verbal cues  Function Midwife transfer assistive device: Grab bar Assist level to toilet: Supervision or verbal cues Assist level from toilet: Supervision or verbal cues  Function - Chair/bed transfer Chair/bed transfer method: Stand pivot Chair/bed transfer assist level: Supervision or verbal cues Chair/bed transfer assistive device: Walker, Armrests Chair/bed transfer details: Verbal cues for technique  Function - Locomotion: Wheelchair Will patient use wheelchair at discharge?: No Type: Manual Max wheelchair distance: 1105ft  Assist Level: Supervision or verbal cues Assist Level: Supervision or verbal cues Wheel 150 feet activity did not occur: Refused Assist Level: Supervision or verbal cues Turns around,maneuvers to table,bed, and toilet,negotiates 3% grade,maneuvers on rugs and over doorsills: No Function - Locomotion: Ambulation Assistive device:  Walker-rolling Max distance: 70 Assist level: Supervision or verbal cues Assist level: Supervision or verbal cues Assist level: Supervision or verbal cues Walk 150 feet activity did not occur: Safety/medical concerns  Assist level: Supervision or verbal cues Walk 10 feet on uneven surfaces activity did not occur:  (with RW ) Assist level: Supervision or verbal cues  Function - Comprehension Comprehension: Auditory Comprehension assistive device: Hearing aids Comprehension assist level: Follows complex conversation/direction with extra time/assistive device  Function - Expression Expression: Verbal Expression assist level: Expresses complex ideas: With no assist  Function - Social Interaction Social Interaction assist level: Interacts appropriately with others - No medications needed.  Function - Problem Solving Problem solving assist level: Solves basic problems with no assist  Function - Memory Memory assist level: More than reasonable amount of time Patient normally able to recall (first 3 days only): Current season, Location of own room, That he or she is in a hospital   Medical Problem List and Plan: 1. 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 DVT Prophylaxis/Anticoagulation: Subcutaneous Lovenox. Monitor platelet counts No signs of bleeding 3. Pain Management: Tylenol as needed 4. Mood: Provide emotional support 5. Neuropsych: This patient iscapable of making decisions on hisown behalf. 6. Skin/Wound Care: Routine skin checks 7. Fluids/Electrolytes/Nutrition: Routine I&Os 8.Arteriosclerotic heart disease with stenting. Continue aspirin and Plavix 9.Diet-controlled diabetes mellitus. Hemoglobin A1c 5.8. CBG discontinued 10.History of hypertension. Resume lisinopril 10 mg daily as needed Vitals:    07/05/16 1451 07/06/16 0434  BP: 118/68 133/62  Pulse: (!) 50 83  Resp: 18 18  Temp: 98.2 F (36.8 C) 98.4 F (36.9 C)   Controlled 5/6 11.Tobacco abuse. Nicoderm patch. Provide counseling 12.Hyperlipidemia. Crestor 13.History of Thrombocytopenia. Follow-up plt stable at [facility] on 4/25  Labs ordered for tomorrow 15.  Hypoalbuminemia- prostat 16.  Hx CAD s/p stent on chronic ASA and Plavix but had small vessel infarct nevertheless, heavy smoker, discussed with cardiology previously 17.  CKD, no nephrotoxic meds, will monitor stable BMP Latest Ref Rng & Units 07/01/2016 06/25/2016 06/23/2016  Glucose 65 - 99 mg/dL - 129(H) 92  BUN 6 - 20 mg/dL - 20 15  Creatinine 0.61 - 1.24 mg/dL 1.43(H) 1.40(H) 1.25(H)  Sodium 135 - 145 mmol/L - 139 139  Potassium 3.5 - 5.1 mmol/L - 4.1 3.6  Chloride 101 - 111 mmol/L - 105 107  CO2 22 - 32 mmol/L - 27 26  Calcium 8.9 - 10.3 mg/dL - 8.6(L) 8.6(L)   Labs ordered for tomorrow 18.  Poor toenail hygiene borderline diabetic needs podiatry f/u as OP 19.  Right hand resting tremor, father had Parkinson's disease, pt has had tremor for 71yr, not evaled by neurology 20.  Bradycardia not on BB, asymptomatic will monitor  LOS (Days) 12 A FACE TO FACE EVALUATION WAS PERFORMED  Sarabella Caprio [name] 07/06/2016, 7:10 AM

## 2016-07-07 ENCOUNTER — Inpatient Hospital Stay (HOSPITAL_COMMUNITY): Payer: Medicare Other | Admitting: Occupational Therapy

## 2016-07-07 ENCOUNTER — Inpatient Hospital Stay (HOSPITAL_COMMUNITY): Payer: Medicare Other | Admitting: Physical Therapy

## 2016-07-07 LAB — CBC WITH DIFFERENTIAL/PLATELET
BASOS ABS: 0 10*3/uL (ref 0.0–0.1)
BASOS PCT: 0 %
Eosinophils Absolute: 0.3 10*3/uL (ref 0.0–0.7)
Eosinophils Relative: 4 %
HEMATOCRIT: 43.2 % (ref 39.0–52.0)
HEMOGLOBIN: 14.3 g/dL (ref 13.0–17.0)
LYMPHS PCT: 16 %
Lymphs Abs: 1.3 10*3/uL (ref 0.7–4.0)
MCH: 30 pg (ref 26.0–34.0)
MCHC: 33.1 g/dL (ref 30.0–36.0)
MCV: 90.8 fL (ref 78.0–100.0)
MONO ABS: 0.7 10*3/uL (ref 0.1–1.0)
Monocytes Relative: 9 %
NEUTROS ABS: 5.8 10*3/uL (ref 1.7–7.7)
NEUTROS PCT: 71 %
Platelets: 121 10*3/uL — ABNORMAL LOW (ref 150–400)
RBC: 4.76 MIL/uL (ref 4.22–5.81)
RDW: 14 % (ref 11.5–15.5)
WBC: 8.1 10*3/uL (ref 4.0–10.5)

## 2016-07-07 LAB — BASIC METABOLIC PANEL
ANION GAP: 6 (ref 5–15)
BUN: 30 mg/dL — ABNORMAL HIGH (ref 6–20)
CALCIUM: 8.7 mg/dL — AB (ref 8.9–10.3)
CO2: 26 mmol/L (ref 22–32)
Chloride: 104 mmol/L (ref 101–111)
Creatinine, Ser: 1.39 mg/dL — ABNORMAL HIGH (ref 0.61–1.24)
GFR calc Af Amer: 56 mL/min — ABNORMAL LOW (ref >60)
GFR, EST NON AFRICAN AMERICAN: 48 mL/min — AB (ref >60)
GLUCOSE: 100 mg/dL — AB (ref 65–99)
POTASSIUM: 4.3 mmol/L (ref 3.5–5.1)
Sodium: 136 mmol/L (ref 135–145)

## 2016-07-07 NOTE — Progress Notes (Signed)
Occupational Therapy Session Note  Patient Details  Name: Donald Richardson MRN: [id number] Date of Birth: December 21, 1940  Today's Date: 07/07/2016  OT Individual Time: 0845-1000 and 1430-1500 OT Individual Time Calculation (min): 75 min and 30 min   Short Term Goals: Week 2:  OT Short Term Goal 1 (Week 2): STG=LTG due to LOS  Skilled Therapeutic Interventions/Progress Updates:    Session one: Pt seen for OT session focusing on ADL re-training and standing balance/ LE strengthening. Pt on toilet upon arrival, finishing BM, toileting task completed supervision-mod I. He returned out to room and completed grooming tasks in standing.  He then ambulated to therapy gym using RW with supervision. Reviewed education of floor transfer taught in previous tx sesion, pt able to recall technique for fall recovery. Completed floor>mat transfer with min cuing for technique. Reviewed what to do in case of fall and recommendation to carry cell phone at all times.  He then completed balance activity in // bars utilizing tilt board and Bosu ball focusing on anterior/posterior and R/L weightshift. Completed with B UE support, intermittently able to let go of UE support.  Following seated rest break, pt ambulated to [employer] and complete weight shifting tasks with and without UE support. Pt benefited from visual and verbal cuing for midline weight shifting and balance. Pt left seated on EOM at end of session with hand off to PT.   Session Two: Pt seen for OT session focusing on functional ambulation and strengthening. Pt asleep in supine upon arrival, easily awoken and agreeable to tx session. He transferred to EOB mod I, ambulated within room to complete toileting task, demonstrates good RW safety/ management during functional task.  He ambulated to therapy gym, VCs for clearance of L LE and staying inside RW once fatigued.  Laying on therapy mat, pt completed x2 sets of 10 bridge raises and sidelying hip ABduction "clam  shells"  For hip strengthening/ ROM. Rest breaks provided btwn sets and VCs for proper form and technique. Pt ambulated back to room in same manner as described above, left seated in w/c with all needs in reach.   Therapy Documentation Precautions:  Precautions Precautions: Fall Restrictions Weight Bearing Restrictions: No Pain:   No/ denies pain  See Function Navigator for Current Functional Status.   Therapy/Group: Individual Therapy  Lewis, [name] 07/07/2016, 7:11 AM

## 2016-07-07 NOTE — Progress Notes (Signed)
Subjective/Complaints: No issues overnite  ROS: Denies N/V/D, CP or SOB  Objective: Vital Signs: Blood pressure 136/62, pulse (!) 50, temperature 98.3 F (36.8 C), temperature source Oral, resp. rate 18, weight 69 kg (152 lb 3.2 oz), SpO2 93 %. No results found. Results for orders placed or performed during the hospital encounter of 06/24/16 (from the past 72 hour(s))  Basic metabolic panel     Status: Abnormal   Collection Time: 07/07/16  5:16 AM  Result Value Ref Range   Sodium 136 135 - 145 mmol/L   Potassium 4.3 3.5 - 5.1 mmol/L   Chloride 104 101 - 111 mmol/L   CO2 26 22 - 32 mmol/L   Glucose, Bld 100 (H) 65 - 99 mg/dL   BUN 30 (H) 6 - 20 mg/dL   Creatinine, Ser 1.39 (H) 0.61 - 1.24 mg/dL   Calcium 8.7 (L) 8.9 - 10.3 mg/dL   GFR calc non Af Amer 48 (L) >60 mL/min   GFR calc Af Amer 56 (L) >60 mL/min    Comment: (NOTE) The eGFR has been calculated using the CKD EPI equation. This calculation has not been validated in all clinical situations. eGFR's persistently <60 mL/min signify possible Chronic Kidney Disease.    Anion gap 6 5 - 15  CBC with Differential/Platelet     Status: Abnormal   Collection Time: 07/07/16  5:16 AM  Result Value Ref Range   WBC 8.1 4.0 - 10.5 K/uL   RBC 4.76 4.22 - 5.81 MIL/uL   Hemoglobin 14.3 13.0 - 17.0 g/dL   HCT 43.2 39.0 - 52.0 %   MCV 90.8 78.0 - 100.0 fL   MCH 30.0 26.0 - 34.0 pg   MCHC 33.1 30.0 - 36.0 g/dL   RDW 14.0 11.5 - 15.5 %   Platelets 121 (L) 150 - 400 K/uL   Neutrophils Relative % 71 %   Neutro Abs 5.8 1.7 - 7.7 K/uL   Lymphocytes Relative 16 %   Lymphs Abs 1.3 0.7 - 4.0 K/uL   Monocytes Relative 9 %   Monocytes Absolute 0.7 0.1 - 1.0 K/uL   Eosinophils Relative 4 %   Eosinophils Absolute 0.3 0.0 - 0.7 K/uL   Basophils Relative 0 %   Basophils Absolute 0.0 0.0 - 0.1 K/uL     HEENT: facial eczema. Atraumatic.  Cardio:IRR no jvd Resp: CTA B/L and unlabored GI: BS positive, ND Skin:   Rash dry erythemous plaques.  Intact. Neuro: Alert/Oriented,  Motor: LUE/LLE: 4+/5 UE and LE (stable) Musc/Skel:  No edema No tenderness. Gen NAD. Well-developed  Assessment/Plan: 1. Functional deficits secondary to RIght Caudate infarct which require 3+ hours per day of interdisciplinary therapy in a comprehensive inpatient rehab setting. Physiatrist is providing close team supervision and 24 hour management of active medical problems listed below. Physiatrist and rehab team continue to assess barriers to discharge/monitor patient progress toward functional and medical goals. FIM: Function - Bathing Position: Shower Body parts bathed by patient: Right arm, Left arm, Chest, Abdomen, Front perineal area, Buttocks, Right upper leg, Left upper leg, Right lower leg, Left lower leg Body parts bathed by helper: Back Bathing not applicable: Back Assist Level: Set up Set up : To obtain items  Function- Upper Body Dressing/Undressing What is the patient wearing?: Pull over shirt/dress Pull over shirt/dress - Perfomed by patient: Thread/unthread right sleeve, Thread/unthread left sleeve, Put head through opening, Pull shirt over trunk Pull over shirt/dress - Perfomed by helper: Pull shirt over trunk Assist Level: No help,  No cues Set up : To obtain clothing/put away Function - Lower Body Dressing/Undressing What is the patient wearing?: Underwear, Pants, Socks, Shoes Position: Sitting EOB Underwear - Performed by patient: Thread/unthread right underwear leg, Thread/unthread left underwear leg, Pull underwear up/down Pants- Performed by patient: Thread/unthread right pants leg, Thread/unthread left pants leg, Pull pants up/down, Fasten/unfasten pants Pants- Performed by helper: Fasten/unfasten pants Non-skid slipper socks- Performed by helper: Don/doff right sock, Don/doff left sock Socks - Performed by patient: Don/doff right sock, Don/doff left sock Shoes - Performed by patient: Don/doff right shoe, Don/doff left  shoe Assist for footwear: Setup Assist for lower body dressing: Supervision or verbal cues  Function - Toileting Toileting activity did not occur: N/A Toileting steps completed by patient: Adjust clothing prior to toileting, Performs perineal hygiene, Adjust clothing after toileting Toileting steps completed by helper: Adjust clothing prior to toileting, Performs perineal hygiene, Adjust clothing after toileting Toileting Assistive Devices: Grab bar or rail Assist level: Supervision or verbal cues  Function Midwife transfer assistive device: Grab bar Assist level to toilet: Supervision or verbal cues Assist level from toilet: Supervision or verbal cues  Function - Chair/bed transfer Chair/bed transfer method: Ambulatory Chair/bed transfer assist level: Supervision or verbal cues Chair/bed transfer assistive device: Walker, Armrests Chair/bed transfer details: Verbal cues for technique  Function - Locomotion: Wheelchair Will patient use wheelchair at discharge?: No Type: Manual Max wheelchair distance: 132f  Assist Level: Supervision or verbal cues Assist Level: Supervision or verbal cues Wheel 150 feet activity did not occur: Refused Assist Level: Supervision or verbal cues Turns around,maneuvers to table,bed, and toilet,negotiates 3% grade,maneuvers on rugs and over doorsills: No Function - Locomotion: Ambulation Assistive device: Walker-rolling Max distance: 70 Assist level: Supervision or verbal cues Assist level: Supervision or verbal cues Assist level: Supervision or verbal cues Walk 150 feet activity did not occur: Safety/medical concerns Assist level: Supervision or verbal cues Walk 10 feet on uneven surfaces activity did not occur:  (with RW ) Assist level: Supervision or verbal cues  Function - Comprehension Comprehension: Auditory Comprehension assistive device: Hearing aids Comprehension assist level: Follows complex conversation/direction  with extra time/assistive device  Function - Expression Expression: Verbal Expression assist level: Expresses complex ideas: With no assist  Function - Social Interaction Social Interaction assist level: Interacts appropriately with others - No medications needed.  Function - Problem Solving Problem solving assist level: Solves basic problems with no assist  Function - Memory Memory assist level: More than reasonable amount of time Patient normally able to recall (first 3 days only): Current season, Location of own room, That he or she is in a hospital   Medical Problem List and Plan: 1. 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 DVT Prophylaxis/Anticoagulation: Subcutaneous Lovenox. Monitor platelet counts No signs of bleeding 3. Pain Management: Tylenol as needed 4. Mood: Provide emotional support 5. Neuropsych: This patient iscapable of making decisions on hisown behalf. 6. Skin/Wound Care: Routine skin checks 7. Fluids/Electrolytes/Nutrition: Routine I&Os 8.Arteriosclerotic heart disease with stenting. Continue aspirin and Plavix 9.Diet-controlled diabetes mellitus. Hemoglobin A1c 5.8. CBG discontinued 10.History of hypertension. Resume lisinopril 10 mg daily as needed Vitals:   07/06/16 1504 07/07/16 0530  BP: (!) 119/53 136/62  Pulse: (!) 46 (!) 50  Resp: 18 18  Temp: 98.3 F (36.8 C) 98.3 F (36.8 C)  Controlled 5/6 11.Tobacco abuse. Nicoderm patch. Provide counseling 12.Hyperlipidemia. Crestor 13.History of Thrombocytopenia. Follow-up plt stable at [place] on 4/25  Labs ordered for tomorrow 15.  Hypoalbuminemia- prostat 16.  Hx CAD s/p stent on chronic ASA and Plavix but had small vessel infarct nevertheless, heavy smoker, discussed with cardiology previously 17.  CKD, no nephrotoxic  meds, BUN elevated discussed with RN, push fluids BMP Latest Ref Rng & Units 07/07/2016 07/01/2016 06/25/2016  Glucose 65 - 99 mg/dL 100(H) - 129(H)  BUN 6 - 20 mg/dL 30(H) - 20  Creatinine 0.61 - 1.24 mg/dL 1.39(H) 1.43(H) 1.40(H)  Sodium 135 - 145 mmol/L 136 - 139  Potassium 3.5 - 5.1 mmol/L 4.3 - 4.1  Chloride 101 - 111 mmol/L 104 - 105  CO2 22 - 32 mmol/L 26 - 27  Calcium 8.9 - 10.3 mg/dL 8.7(L) - 8.6(L)   Labs ordered for tomorrow 18.  Poor toenail hygiene borderline diabetic needs podiatry f/u as OP 19.  Right hand resting tremor, father had Parkinson's disease, pt has had tremor for 20yr not evaled by neurology 20.  Bradycardia not on BB, asymptomatic will monitor  LOS (Days) 13 A FACE TO FACE EVALUATION WAS PERFORMED  Valeda Corzine E 07/07/2016, 7:23 AM

## 2016-07-07 NOTE — Progress Notes (Signed)
Occupational Therapy Session Note  Patient Details  Name: Donald Richardson MRN: [id number] Date of Birth: Jul 04, 1940  Today's Date: 07/07/2016 OT Individual Time: 7998-7215 OT Individual Time Calculation (min): 30 min    Short Term Goals: Week 2:  OT Short Term Goal 1 (Week 2): STG=LTG due to LOS  Skilled Therapeutic Interventions/Progress Updates: Patient participated in skilled OT today with most focus on standing balance and neuro reducation to trunk and left upper and lower extremities.   Patient a little guarded and stiff in trunk and pelvic area.   He required CGA for static standing balance and moderate assistance for dynamic standing balance.  He was left seated In his w/c and witn lunch tray withcall bell and phone in place at the end of the session.     Therapy Documentation Precautions:  Precautions Precautions: Fall Restrictions Weight Bearing Restrictions: No   Pain:denied    See Function Navigator for Current Functional Status.   Therapy/Group: Individual Therapy  Alfredia Ferguson [facility] 07/07/2016, 2:09 PM

## 2016-07-07 NOTE — Progress Notes (Signed)
Physical Therapy Session Note  Patient Details  Name: Donald Richardson MRN: [id number] Date of Birth: Sep 01, 1940  Today's Date: 07/07/2016 PT Individual Time:   1000-1100 PT individual Time Calculation:  60 min   Short Term Goals: Week 2:  PT Short Term Goal 1 (Week 2): STG=LTG due to ELOS  Skilled Therapeutic Interventions/Progress Updates: Pt presented in gym hand off from OT. Performed obstacle course stepping over dowels and steps for promotion of increased step length and food clearance. Provided and instructed in [place] C exercises x 20 bilaterally each activity.  Balance challenges with use of rebounder performing with feet on ground and 1 foot on step. Pt demosntrated fair + balance with moderate challenges and no AD. Ambulation in unit to day room with cues for increasing step length, increasing BOS, and foot clearance. Pt able to maintain until reaching point of fatigue (approx 145f) then noted decreased gait quality. Pt returned to room and remained in w/c with needs met.      Therapy Documentation Precautions:  Precautions Precautions: Fall Restrictions Weight Bearing Restrictions: No   See Function Navigator for Current Functional Status.   Therapy/Group: Individual Therapy  Khyren Hing  Farmer Mccahill, PTA  07/07/2016, 10:45 AM

## 2016-07-08 ENCOUNTER — Inpatient Hospital Stay (HOSPITAL_COMMUNITY): Payer: Medicare Other | Admitting: Physical Therapy

## 2016-07-08 ENCOUNTER — Inpatient Hospital Stay (HOSPITAL_COMMUNITY): Payer: Medicare Other | Admitting: Occupational Therapy

## 2016-07-08 LAB — CREATININE, SERUM
Creatinine, Ser: 1.36 mg/dL — ABNORMAL HIGH (ref 0.61–1.24)
GFR calc Af Amer: 58 mL/min — ABNORMAL LOW (ref >60)
GFR calc non Af Amer: 50 mL/min — ABNORMAL LOW (ref >60)

## 2016-07-08 NOTE — Discharge Summary (Signed)
Donald Richardson, Donald Richardson                  ACCOUNT NO.:  [id number]  MEDICAL RECORD NO.:  [id number]  LOCATION:  4M06C                        FACILITY:  [facility]  PHYSICIAN:  Lauraine Rinne, P.A.  DATE OF BIRTH:  May 01, 1940  DATE OF ADMISSION:  06/24/2016 DATE OF DISCHARGE:  07/09/2016                              DISCHARGE SUMMARY   DISCHARGE DIAGNOSES: 1. 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 2. Subcutaneous Lovenox for deep vein thrombosis prophylaxis. 3. Pain management. 4. Arteriosclerotic heart disease with stenting. 5. Diet-controlled diabetes mellitus. 6. Hypertension. 7. Tobacco abuse. 8. Hyperlipidemia. 9. History of thrombocytopenia. 10.Decreased nutritional storage. 11.Chronic kidney disease stage 3. 12.Right hand tremor. 13.Bradycardia-asymptomatic.  HISTORY OF PRESENT ILLNESS:  This is a 76 year old right-handed male with history of tobacco abuse, COPD, thrombocytopenia, arteriosclerotic heart disease with stenting, diet controlled diabetes mellitus.  Lives alone, independent prior to admission.  Closest family is in [place] being his brother, who can assist as needed.  Presented June 20, 2016, with left-sided weakness and a fall x2 without loss of consciousness.  CT of the head showed a 9 mm hyperdense focus near the head of the right caudate, no significant surrounding edema or mass effect.  MRI showed right caudate infarct.  MRA unremarkable.  Carotid Dopplers with no ICA stenosis.  Echocardiogram with ejection fraction of 55% and grade 2 diastolic dysfunction.  Neurology consulted, maintained on aspirin and Plavix for CVA prophylaxis.  Subcutaneous Lovenox for DVT prophylaxis.  Tolerating a regular diet.  The patient was admitted for a comprehensive rehab program.  PAST MEDICAL HISTORY:  See discharge diagnoses.  SOCIAL HISTORY:  Lives alone, independent prior to admission.  He has a brother with good support.  Functional  status upon admission to rehab services was min-to-mod assist, 70 feet, rolling walker; minimal assist sit to stand; min-to-mod assist, activities of daily living.  PHYSICAL EXAMINATION:  VITAL SIGNS:  Blood pressure 150/73, pulse 45, temperature 98, respirations 20. GENERAL:  This was an alert male, in no acute distress.  Oriented to person, place, and time.  EOMs intact. NECK:  Supple.  Nontender.  No JVD. CARDIAC:  Rate in the 40s and 50s, without murmur. LUNGS:  Clear to auscultation without wheeze. ABDOMEN:  Soft, nontender.  Good bowel sounds. EXTREMITIES:  He had a pill rolling tremor right upper extremity with plus-minus cogwheeling rigidity with range of motion.  REHABILITATION HOSPITAL COURSE:  The patient was admitted to inpatient rehab services with therapies initiated on a 3-hour daily basis, consisting of physical therapy, occupational therapy, and rehabilitation nursing.  The following issues were addressed during the patient's rehabilitation stay.  Pertaining to Mr. Vonderhaar right caudate infarction, he continued on aspirin and Plavix therapy.  Plan to follow up Neurology Services.  Subcutaneous Lovenox for DVT prophylaxis.  No bleeding episodes.  No chest pain or shortness of breath with noted history of arteriosclerotic heart disease with stenting.  Diet- controlled diabetes mellitus, hemoglobin A1c of 5.8 his blood sugars had been since discontinued.  He continued on diabetic diet.  Blood pressure is currently controlled.  He had been on lisinopril prior to admission. This could be resumed as needed.  History of tobacco abuse.  He received full counsel in regard to cessation of nicotine products.  He did have a history of thrombocytopenia 109,000, no bleeding episodes.  CKD stage 3. BUN 1.39-1.40 and monitored.  He would continue to be followed by his primary care provider.  The patient had a resting tremor to the right hand.  He had stated he had this for  approximately the past 20 years. Plan was to follow up with Neurology Services.  Bradycardia on no beta- blockers, asymptomatic.  No chest pain or shortness of breath.  He participated fully with his therapies.  The patient received weekly collaborative interdisciplinary team conferences to discuss estimated length of stay, family teaching, any barriers to discharge.  He was ambulating with a rolling walker on uneven surfaces supervision assist. Dynamic gait training without assistive device.  He could go 15 feet. Bed mobility sit to stand, supervision.  He could gather his belongings for activities of daily living and homemaking.  Participated with hygiene and dressing, required contact guard assist.  He could go up and down stairs with supervision.  It was discussed no driving.  Full family teaching completed and plan discharge to home.  DISCHARGE MEDICATIONS: 1. Aspirin 325 mg p.o. daily. 2. Plavix 75 mg p.o. daily. 3. Crestor 20 mg p.o. at bedtime. 4. Tylenol as needed.  DIET:  Diabetic diet.  The patient would follow up with Dr. Alysia Penna at the outpatient rehab center as directed; Dr. Erlinda Hong, Neurology Service, call for appointment; Dr. Redmond School, Medical Management.  SPECIAL INSTRUCTIONS:  No driving or smoking. Follow-up with PCP on resuming antihypertensive medication at MDs discretion    Lauraine Rinne, P.A.     DA/MEDQ  D:  07/08/2016  T:  07/08/2016  Job:  [id number]  cc:   Jill Alexanders, M.D. Dr. Erlinda Hong

## 2016-07-08 NOTE — Plan of Care (Signed)
Problem: RH BOWEL ELIMINATION Goal: RH STG MANAGE BOWEL W/MEDICATION W/ASSISTANCE STG Manage Bowel with Medication with supervisionAssistance.  Outcome: Progressing No bm this shift  Problem: RH SKIN INTEGRITY Goal: RH STG SKIN FREE OF INFECTION/BREAKDOWN Outcome: Progressing No skin issues noted  Problem: RH SAFETY Goal: RH STG DECREASED RISK OF FALL WITH ASSISTANCE STG Decreased Risk of Fall With supervision Assistance.  Outcome: Progressing Safety precautions maintained

## 2016-07-08 NOTE — Progress Notes (Signed)
Physical Therapy Discharge Summary  Patient Details  Name: Donald Richardson MRN: [id number] Date of Birth: 1940-05-13  Today's Date: 07/08/2016    Patient has met 12 of 12 long term goals due to improved activity tolerance, improved balance, increased strength, improved awareness and improved coordination.  Patient to discharge at an ambulatory level Modified Independent.   Patient's care partner Pt lives alone to provide the necessary n/a assistance at discharge.  Reasons goals not met: N/A  Recommendation:  Patient will benefit from ongoing skilled PT services in home health setting to continue to advance safe functional mobility, address ongoing impairments in balance, strength, coordination, safety with gait, and minimize fall risk.  Equipment: No equipment provided Pt has RW  Reasons for discharge: treatment goals met  Patient/family agrees with progress made and goals achieved: Yes  PT Discharge Precautions/Restrictions Precautions Precautions: Fall Restrictions Weight Bearing Restrictions: No Vital Signs   Pain   Vision/Perception  Vision - History Baseline Vision: Wears glasses only for reading Patient Visual Report: No change from baseline  Cognition Overall Cognitive Status: Within Functional Limits for tasks assessed Arousal/Alertness: Awake/alert Orientation Level: Oriented X4 Memory: Appears intact Awareness: Appears intact Problem Solving: Appears intact Safety/Judgment: Appears intact Sensation Sensation Light Touch: Appears Intact Proprioception: Appears Intact Coordination Gross Motor Movements are Fluid and Coordinated: No Fine Motor Movements are Fluid and Coordinated: Yes Coordination and Movement Description: Generalized weakness, L LE drags when fatigued Motor  Motor Motor: Hemiplegia Motor - Discharge Observations: L sides weakness L LE>L UE, much improved since admission.   Mobility Bed Mobility Bed Mobility: Rolling Right;Rolling  Left;Supine to Sit;Sit to Supine Rolling Right: 6: Modified independent (Device/Increase time) Rolling Left: 6: Modified independent (Device/Increase time) Supine to Sit: 6: Modified independent (Device/Increase time) Sit to Supine: 6: Modified independent (Device/Increase time) Transfers Transfers: Yes Sit to Stand: 6: Modified independent (Device/Increase time) Stand Pivot Transfers: 6: Modified independent (Device/Increase time) Locomotion  Ambulation Ambulation/Gait Assistance: 6: Modified independent (Device/Increase time) Ambulation Distance (Feet): 200 Feet Assistive device: Rolling walker Ambulation/Gait Assistance Details: Verbal cues for precautions/safety Gait Gait: Yes Gait Pattern: Impaired Gait Pattern: Step-through pattern;Poor foot clearance - left;Narrow base of support Stairs / Additional Locomotion Stairs: Yes Stairs Assistance: 6: Modified independent (Device/Increase time) Stair Management Technique: Two rails Number of Stairs: 12 Height of Stairs: 6 Wheelchair Mobility Wheelchair Mobility: No  Trunk/Postural Assessment  Cervical Assessment Cervical Assessment: Exceptions to [facility] (Rounded Head) Thoracic Assessment Thoracic Assessment: Exceptions to [facility] (Kyphotic) Lumbar Assessment Lumbar Assessment: Exceptions to [facility] (Posterior Pelvic Tilt) Postural Control Postural Control: Within Functional Limits  Balance Balance Balance Assessed: Yes Berg Balance Test Sit to Stand: Able to stand without using hands and stabilize independently Standing Unsupported: Able to stand safely 2 minutes Sitting with Back Unsupported but Feet Supported on Floor or Stool: Able to sit safely and securely 2 minutes Stand to Sit: Controls descent by using hands Transfers: Able to transfer safely, minor use of hands Standing Unsupported with Eyes Closed: Able to stand 10 seconds safely Standing Ubsupported with Feet Together: Able to place feet together independently and stand 1  minute safely From Standing, Reach Forward with Outstretched Arm: Can reach confidently >25 cm (10") From Standing Position, Pick up Object from Floor: Able to pick up shoe safely and easily From Standing Position, Turn to Look Behind Over each Shoulder: Looks behind from both sides and weight shifts well Turn 360 Degrees: Able to turn 360 degrees safely but slowly Standing Unsupported, Alternately Place Feet on Step/Stool:  Able to stand independently and complete 8 steps >20 seconds Standing Unsupported, One Foot in Front: Able to plae foot ahead of the other independently and hold 30 seconds Standing on One Leg: Tries to lift leg/unable to hold 3 seconds but remains standing independently Total Score: 48 Dynamic Sitting Balance Dynamic Sitting - Balance Support: During functional activity;Feet supported Dynamic Sitting - Level of Assistance: 6: Modified independent (Device/Increase time) Static Standing Balance Static Standing - Balance Support: During functional activity Static Standing - Level of Assistance: 6: Modified independent (Device/Increase time) Dynamic Standing Balance Dynamic Standing - Balance Support: No upper extremity supported Dynamic Standing - Level of Assistance: 6: Modified independent (Device/Increase time) Extremity Assessment  RUE Assessment RUE Assessment: Within Functional Limits LUE Assessment LUE Assessment: Within Functional Limits RLE Assessment RLE Assessment: Within Functional Limits LLE Assessment LLE Assessment: Exceptions to [facility] LLE Strength LLE Overall Strength Comments: 4+/5 improved initation   See Function Navigator for Current Functional Status.  Donald Richardson 07/08/2016, 12:16 PM

## 2016-07-08 NOTE — Progress Notes (Signed)
Occupational Therapy Session Note  Patient Details  Name: CADAN MAGGART MRN: [id number] Date of Birth: [birth year]  Today's Date: 07/08/2016 OT Individual Time: 3790-2409 OT Individual Time Calculation (min): 60 min    Short Term Goals: Week 2:  OT Short Term Goal 1 (Week 2): STG=LTG due to LOS  Skilled Therapeutic Interventions/Progress Updates:    Pt seen for OT ADL bathing/dressing session. Pt in supine upon arrival, awake and ready for tx session. He transferred to EOB from flat bed simulating home environment with increased time. Throughout session, he ambulated throughout room mod I to gather items in prep for showering task. Pt occasionally with poor RW managaement during functional tasks and transfers, however, remains safe despite this. He undressed in standing position, recommended pt sit for higher level balance tasks at home to reduce risk of fall. He bathed mod I seated on tub bench. He ambulated back to room and dressed seated from EOB.  He then ambulated to gather bathroom items from floor etc. With increased time. Pt left seated on EOB at end of session, all needs in reach.  Educated throughout session regarding pt's fall risk, reducing risk of falls, and d/c planing.   Pt made mod I in room this morning in prep for d/c home tomorrow at Mod I level. Reviewed with pt need to call for assist if needed.   Therapy Documentation Precautions:  Precautions Precautions: Fall Restrictions Weight Bearing Restrictions: No  See Function Navigator for Current Functional Status.   Therapy/Group: Individual Therapy  Lewis, Lorence Nagengast C 07/08/2016, 7:04 AM

## 2016-07-08 NOTE — Progress Notes (Signed)
Subjective/Complaints: No issues overnite discussed poor oral intake, pt states no one told him to drink more yesterday  ROS: Denies N/V/D, CP or SOB  Objective: Vital Signs: Blood pressure 127/64, pulse 61, temperature 98.3 F (36.8 C), temperature source Oral, resp. rate 18, weight 69 kg (152 lb 3.2 oz), SpO2 98 %. No results found. Results for orders placed or performed during the hospital encounter of 06/24/16 (from the past 72 hour(s))  Basic metabolic panel     Status: Abnormal   Collection Time: 07/07/16  5:16 AM  Result Value Ref Range   Sodium 136 135 - 145 mmol/L   Potassium 4.3 3.5 - 5.1 mmol/L   Chloride 104 101 - 111 mmol/L   CO2 26 22 - 32 mmol/L   Glucose, Bld 100 (H) 65 - 99 mg/dL   BUN 30 (H) 6 - 20 mg/dL   Creatinine, Ser 1.39 (H) 0.61 - 1.24 mg/dL   Calcium 8.7 (L) 8.9 - 10.3 mg/dL   GFR calc non Af Amer 48 (L) >60 mL/min   GFR calc Af Amer 56 (L) >60 mL/min    Comment: (NOTE) The eGFR has been calculated using the CKD EPI equation. This calculation has not been validated in all clinical situations. eGFR's persistently <60 mL/min signify possible Chronic Kidney Disease.    Anion gap 6 5 - 15  CBC with Differential/Platelet     Status: Abnormal   Collection Time: 07/07/16  5:16 AM  Result Value Ref Range   WBC 8.1 4.0 - 10.5 K/uL   RBC 4.76 4.22 - 5.81 MIL/uL   Hemoglobin 14.3 13.0 - 17.0 g/dL   HCT 43.2 39.0 - 52.0 %   MCV 90.8 78.0 - 100.0 fL   MCH 30.0 26.0 - 34.0 pg   MCHC 33.1 30.0 - 36.0 g/dL   RDW 14.0 11.5 - 15.5 %   Platelets 121 (L) 150 - 400 K/uL   Neutrophils Relative % 71 %   Neutro Abs 5.8 1.7 - 7.7 K/uL   Lymphocytes Relative 16 %   Lymphs Abs 1.3 0.7 - 4.0 K/uL   Monocytes Relative 9 %   Monocytes Absolute 0.7 0.1 - 1.0 K/uL   Eosinophils Relative 4 %   Eosinophils Absolute 0.3 0.0 - 0.7 K/uL   Basophils Relative 0 %   Basophils Absolute 0.0 0.0 - 0.1 K/uL  Creatinine, serum     Status: Abnormal   Collection Time: 07/08/16  5:10  AM  Result Value Ref Range   Creatinine, Ser 1.36 (H) 0.61 - 1.24 mg/dL   GFR calc non Af Amer 50 (L) >60 mL/min   GFR calc Af Amer 58 (L) >60 mL/min    Comment: (NOTE) The eGFR has been calculated using the CKD EPI equation. This calculation has not been validated in all clinical situations. eGFR's persistently <60 mL/min signify possible Chronic Kidney Disease.      HEENT: facial eczema. Atraumatic.  Cardio:IRR no jvd Resp: CTA B/L and unlabored GI: BS positive, ND Skin:   Rash dry erythemous plaques. Intact. Neuro: Alert/Oriented,  Motor: LUE/LLE: 4+/5 UE and LE (stable) Musc/Skel:  No edema No tenderness. Gen NAD. Well-developed  Assessment/Plan: 1. Functional deficits secondary to RIght Caudate infarct which require 3+ hours per day of interdisciplinary therapy in a comprehensive inpatient rehab setting. Physiatrist is providing close team supervision and 24 hour management of active medical problems listed below. Physiatrist and rehab team continue to assess barriers to discharge/monitor patient progress toward functional and medical goals. FIM:  Function - Bathing Position: Shower Body parts bathed by patient: Right arm, Left arm, Chest, Abdomen, Front perineal area, Buttocks, Right upper leg, Left upper leg, Right lower leg, Left lower leg Body parts bathed by helper: Back Bathing not applicable: Back Assist Level: Set up Set up : To obtain items  Function- Upper Body Dressing/Undressing What is the patient wearing?: Pull over shirt/dress Pull over shirt/dress - Perfomed by patient: Thread/unthread right sleeve, Thread/unthread left sleeve, Put head through opening, Pull shirt over trunk Pull over shirt/dress - Perfomed by helper: Pull shirt over trunk Assist Level: No help, No cues Set up : To obtain clothing/put away Function - Lower Body Dressing/Undressing What is the patient wearing?: Underwear, Pants, Socks, Shoes Position: Sitting EOB Underwear - Performed  by patient: Thread/unthread right underwear leg, Thread/unthread left underwear leg, Pull underwear up/down Pants- Performed by patient: Thread/unthread right pants leg, Thread/unthread left pants leg, Pull pants up/down, Fasten/unfasten pants Pants- Performed by helper: Fasten/unfasten pants Non-skid slipper socks- Performed by helper: Don/doff right sock, Don/doff left sock Socks - Performed by patient: Don/doff right sock, Don/doff left sock Shoes - Performed by patient: Don/doff right shoe, Don/doff left shoe Assist for footwear: Setup Assist for lower body dressing: Supervision or verbal cues  Function - Toileting Toileting activity did not occur: N/A Toileting steps completed by patient: Adjust clothing prior to toileting, Performs perineal hygiene, Adjust clothing after toileting Toileting steps completed by helper: Adjust clothing prior to toileting, Performs perineal hygiene, Adjust clothing after toileting Toileting Assistive Devices: Grab bar or rail Assist level: Supervision or verbal cues  Function Midwife transfer assistive device: Grab bar Assist level to toilet: Supervision or verbal cues Assist level from toilet: Supervision or verbal cues  Function - Chair/bed transfer Chair/bed transfer method: Ambulatory Chair/bed transfer assist level: Supervision or verbal cues Chair/bed transfer assistive device: Environmental consultant, Armrests Chair/bed transfer details: Verbal cues for technique  Function - Locomotion: Wheelchair Will patient use wheelchair at discharge?: No Type: Manual Max wheelchair distance: 127f  Assist Level: Supervision or verbal cues Assist Level: Supervision or verbal cues Wheel 150 feet activity did not occur: Refused Assist Level: Supervision or verbal cues Turns around,maneuvers to table,bed, and toilet,negotiates 3% grade,maneuvers on rugs and over doorsills: No Function - Locomotion: Ambulation Assistive device: Walker-rolling Max  distance: 70 Assist level: Supervision or verbal cues Assist level: Supervision or verbal cues Assist level: Supervision or verbal cues Walk 150 feet activity did not occur: Safety/medical concerns Assist level: Supervision or verbal cues Walk 10 feet on uneven surfaces activity did not occur:  (with RW ) Assist level: Supervision or verbal cues  Function - Comprehension Comprehension: Auditory Comprehension assistive device: Hearing aids Comprehension assist level: Understands basic 75 - 89% of the time/ requires cueing 10 - 24% of the time  Function - Expression Expression: Verbal Expression assist level: Expresses complex 90% of the time/cues < 10% of the time  Function - Social Interaction Social Interaction assist level: Interacts appropriately with others with medication or extra time (anti-anxiety, antidepressant).  Function - Problem Solving Problem solving assist level: Solves basic 75 - 89% of the time/requires cueing 10 - 24% of the time  Function - Memory Memory assist level: Recognizes or recalls 90% of the time/requires cueing < 10% of the time Patient normally able to recall (first 3 days only): Current season, That he or she is in a hospital, Location of own room   Medical Problem List and Plan: 1. Left-sided weaknesssecondary to Acute  right caudate infarct likely secondary to small vessel disease  Cont CIR tent d/c 5/9  Parkinsonian features may be related to lesion location, no restng tremor, no rigidity 2. DVT Prophylaxis/Anticoagulation: Subcutaneous Lovenox. Monitor platelet counts No signs of bleeding 3. Pain Management: Tylenol as needed 4. Mood: Provide emotional support 5. Neuropsych: This patient iscapable of making decisions on hisown behalf. 6. Skin/Wound Care: Routine skin checks 7. Fluids/Electrolytes/Nutrition: Routine I&Os 8.Arteriosclerotic heart disease with stenting. Continue aspirin and Plavix 9.Diet-controlled diabetes mellitus.  Hemoglobin A1c 5.8. CBG discontinued 10.History of hypertension. Resume lisinopril 10 mg daily as needed Vitals:   07/07/16 1453 07/08/16 0441  BP: (!) 107/57 127/64  Pulse: (!) 46 61  Resp: 18 18  Temp: 98.5 F (36.9 C) 98.3 F (36.8 C)   Controlled 5/6 11.Tobacco abuse. Nicoderm patch. Provide counseling 12.Hyperlipidemia. Crestor 13.History of Thrombocytopenia. Follow-up plt stable at [facility] on 4/25  15.  Hypoalbuminemia- prostat 16.  Hx CAD s/p stent on chronic ASA and Plavix but had small vessel infarct nevertheless, heavy smoker, discussed with cardiology previously 17.  CKD, no nephrotoxic meds, BUN elevated discussed with RN, push fluids, 733m intake yesterday BMP Latest Ref Rng & Units 07/08/2016 07/07/2016 07/01/2016  Glucose 65 - 99 mg/dL - 100(H) -  BUN 6 - 20 mg/dL - 30(H) -  Creatinine 0.61 - 1.24 mg/dL 1.36(H) 1.39(H) 1.43(H)  Sodium 135 - 145 mmol/L - 136 -  Potassium 3.5 - 5.1 mmol/L - 4.3 -  Chloride 101 - 111 mmol/L - 104 -  CO2 22 - 32 mmol/L - 26 -  Calcium 8.9 - 10.3 mg/dL - 8.7(L) -   Repeat BMET in am 18.  Poor toenail hygiene borderline diabetic needs podiatry f/u as OP 19.  Right hand resting tremor, father had Parkinson's disease, pt has had tremor for 267yrnot evaled by neurology 20.  Bradycardia not on BB, asymptomatic will monitor  LOS (Days) 14 A FACE TO FACE EVALUATION WAS PERFORMED  Claudine Stallings E 07/08/2016, 7:39 AM

## 2016-07-08 NOTE — Discharge Summary (Signed)
Discharge summary job [id number]

## 2016-07-08 NOTE — Discharge Instructions (Signed)
Inpatient Rehab Discharge Instructions  Donald Richardson Discharge date and time: No discharge date for patient encounter.   Activities/Precautions/ Functional Status: Activity: activity as tolerated Diet: diabetic diet Wound Care: none needed Functional status:  ___ No restrictions     ___ Walk up steps independently ___ 24/7 supervision/assistance   ___ Walk up steps with assistance ___ Intermittent supervision/assistance  ___ Bathe/dress independently ___ Walk with walker     _x__ Bathe/dress with assistance ___ Walk Independently    ___ Shower independently ___ Walk with assistance    ___ Shower with assistance ___ No alcohol     ___ Return to work/school ________  Special Instructions: No driving or smoking   COMMUNITY REFERRALS UPON DISCHARGE:    Home Health:   PT, OT, RN   Agency:[facility]   Date of last service:07/09/2016  Medical Equipment/Items Ordered:HAS ALL NEEDED EQUIPMENT OR HAS GOTTEN IT THROUGH HIS BROTHER  Other:PRIVATE DUTY AGENCY LIST GIVEN TO PT AND BROTHER  GENERAL COMMUNITY RESOURCES FOR PATIENT/FAMILY: Support Groups:[facility] SUPPORT GROUP EVERY SECOND Thursday @ 3:00-4:00 PM ON THE REHAB UNIT QUESTIONS CONTACT CAITLYN [phone]  STROKE/TIA DISCHARGE INSTRUCTIONS SMOKING Cigarette smoking nearly doubles your risk of having a stroke & is the single most alterable risk factor  If you smoke or have smoked in the last 12 months, you are advised to quit smoking for your health.  Most of the excess cardiovascular risk related to smoking disappears within a year of stopping.  Ask you doctor about anti-smoking medications  [place] Quit Line: 1-800-QUIT NOW  Free Smoking Cessation Classes [phone]  CHOLESTEROL Know your levels; limit fat & cholesterol in your diet  Lipid Panel     Component Value Date/Time   CHOL 89 06/21/2016 0529   TRIG 68 06/21/2016 0529   HDL 30 (L) 06/21/2016 0529   CHOLHDL 3.0 06/21/2016 0529   VLDL 14  06/21/2016 0529   LDLCALC 45 06/21/2016 0529      Many patients benefit from treatment even if their cholesterol is at goal.  Goal: Total Cholesterol (CHOL) less than 160  Goal:  Triglycerides (TRIG) less than 150  Goal:  HDL greater than 40  Goal:  LDL (LDLCALC) less than 100   BLOOD PRESSURE American Stroke Association blood pressure target is less that 120/80 mm/Hg  Your discharge blood pressure is:  BP: 136/62  Monitor your blood pressure  Limit your salt and alcohol intake  Many individuals will require more than one medication for high blood pressure  DIABETES (A1c is a blood sugar average for last 3 months) Goal HGBA1c is under 7% (HBGA1c is blood sugar average for last 3 months)  Diabetes:    Lab Results  Component Value Date   HGBA1C 5.8 (H) 06/21/2016     Your HGBA1c can be lowered with medications, healthy diet, and exercise.  Check your blood sugar as directed by your physician  Call your physician if you experience unexplained or low blood sugars.  PHYSICAL ACTIVITY/REHABILITATION Goal is 30 minutes at least 4 days per week  Activity: Increase activity slowly, Therapies: Physical Therapy: Home Health Return to work:   Activity decreases your risk of heart attack and stroke and makes your heart stronger.  It helps control your weight and blood pressure; helps you relax and can improve your mood.  Participate in a regular exercise program.  Talk with your doctor about the best form of exercise for you (dancing, walking, swimming, cycling).  DIET/WEIGHT Goal is to maintain  a healthy weight  Your discharge diet is: Diet heart healthy/carb modified Room service appropriate? Yes; Fluid consistency: Thin  liquids Your height is:    Your current weight is: Weight: 69 kg (152 lb 3.2 oz) Your Body Mass Index (BMI) is:     Following the type of diet specifically designed for you will help prevent another stroke.  Your goal weight range is:    Your goal Body  Mass Index (BMI) is 19-24.  Healthy food habits can help reduce 3 risk factors for stroke:  High cholesterol, hypertension, and excess weight.  RESOURCES Stroke/Support Group:  Call [phone]   STROKE EDUCATION PROVIDED/REVIEWED AND GIVEN TO PATIENT Stroke warning signs and symptoms How to activate emergency medical system (call 911). Medications prescribed at discharge. Need for follow-up after discharge. Personal risk factors for stroke. Pneumonia vaccine given:  Flu vaccine given:  My questions have been answered, the writing is legible, and I understand these instructions.  I will adhere to these goals & educational materials that have been provided to me after my discharge from the hospital.      My questions have been answered and I understand these instructions. I will adhere to these goals and the provided educational materials after my discharge from the hospital.  Patient/Caregiver Signature _______________________________ Date __________  Clinician Signature _______________________________________ Date __________  Please bring this form and your medication list with you to all your follow-up doctor's appointments.

## 2016-07-08 NOTE — Progress Notes (Signed)
Occupational Therapy Discharge Summary  Patient Details  Name: Donald Richardson MRN: [id number] Date of Birth: 07-Apr-1940   Patient has met 12 of 12 long term goals due to improved activity tolerance, improved balance, postural control and improved coordination.  Patient to discharge at overall Modified Independent level.  Patient to return home living independently. Pt's brother provided assist prior to d/c in regards to setting up home including installation of stair railing, tub transfer bench and hand held shower head. Reviewed with pt on multiple occasions need for MD clearance prior to returning to drive as pt very motivatd to return to PLOF and return to driving. Education provided regarding pt's high fall risk, what to do in case of fall, and ways to reduce fall risks. Pt voices feeling comfortable and confident with planned d/c home at Mod I level.    Recommendation:  Patient will benefit from ongoing skilled OT services in home health setting to continue to advance functional skills in the area of BADL and iADL.  Equipment: Pt's family reports privately obtaining BSC and tub transfer bench per OT recommendation  Reasons for discharge: treatment goals met and discharge from hospital  Patient/family agrees with progress made and goals achieved: Yes  OT Discharge Precautions/Restrictions  Precautions Precautions: Fall Restrictions Weight Bearing Restrictions: No Vision/Perception    Wears glasses for reading only; no change from baseline Cognition Overall Cognitive Status: Within Functional Limits for tasks assessed Arousal/Alertness: Awake/alert Orientation Level: Oriented X4 Memory: Appears intact Awareness: Appears intact Problem Solving: Appears intact Safety/Judgment: Appears intact Sensation Sensation Light Touch: Appears Intact Proprioception: Appears Intact Coordination Gross Motor Movements are Fluid and Coordinated: No Fine Motor Movements are Fluid and  Coordinated: Yes Coordination and Movement Description: Generalized weakness, L LE drags when fatigued Motor  Motor Motor: Hemiplegia Motor - Discharge Observations: L sides weakness L LE>L UE, much improved since admission.  Trunk/Postural Assessment  Cervical Assessment Cervical Assessment: Exceptions to [facility] (Forward head) Thoracic Assessment Thoracic Assessment: Exceptions to [facility] (Rounded shoulders; kyphotic) Lumbar Assessment Lumbar Assessment: Exceptions to [facility] (Posterior Pelvic tilt) Postural Control Postural Control: Within Functional Limits  Balance Balance Balance Assessed: Yes Dynamic Sitting Balance Dynamic Sitting - Balance Support: During functional activity;Feet supported Dynamic Sitting - Level of Assistance: 6: Modified independent (Device/Increase time) Static Standing Balance Static Standing - Balance Support: During functional activity Static Standing - Level of Assistance: 6: Modified independent (Device/Increase time) Dynamic Standing Balance Dynamic Standing - Balance Support: No upper extremity supported Dynamic Standing - Level of Assistance: 6: Modified independent (Device/Increase time) Extremity/Trunk Assessment RUE Assessment RUE Assessment: Within Functional Limits LUE Assessment LUE Assessment: Within Functional Limits   See Function Navigator for Current Functional Status.  [place], [place] 07/08/2016, 9:22 AM

## 2016-07-08 NOTE — Progress Notes (Signed)
Social Work Patient ID: Donald Richardson, male   DOB: 1941-01-23, 76 y.o.   MRN: [id number]  Pt made mod/I in room in anticipation of discharge tomorrow. Pt feels ready and adaptations made at home for him. He has all equipment and  Follow up arranged for home health. Pt's brother will be here tomorrow around 11:00 to transport him home.

## 2016-07-08 NOTE — Progress Notes (Signed)
Physical Therapy Session Note  Patient Details  Name: Donald Richardson MRN: [id number] Date of Birth: 05-16-1940  Today's Date: 07/08/2016 PT Individual Time: 1035-1200 1300-1400 PT Individual Time Calculation (min): 85 min and 60 min  Short Term Goals: Week 2:  PT Short Term Goal 1 (Week 2): STG=LTG due to ELOS  Skilled Therapeutic Interventions/Progress Updates: Tx1: Pt presented in w/c agreeable to therapy. Performed functional activities in preparation for d/c. Performed car transfer, gait indoors 226f, outdoors with distances up to 25102f Pt was mod I gait indoors, close S outside with x 1 verbal cue for RW safety and foot clearance over threshold with carpet when entering hospital. Pt able to perform all bed mobility on flat surface with mod I and no features. Ascend/descend x 12 steps with 2 rails step through ascending, step to descending. Pt partcipated in [place] with improvement in score from 39/56 to 48/56. Discussed with pt d/c plans and progression once d/c'd.  Pt returned to room and remained in w/c at end of session for lunch.     Tx2: Pt asleep in w/c easily aroused. Transported to rehab gym and performed Wii Fit balance activities. Pt able to perform with RW and no AD, moderate cues for leaning outside BOS however no LOB noted. Pt able to tolerate standing bouts up to 10 min before fatigue. Pt ambulated back to room with RW and mod I. Pt returned to w/c with all current needs met.   Therapy Documentation Precautions:  Precautions Precautions: Fall Restrictions Weight Bearing Restrictions: No      See Function Navigator for Current Functional Status.   Therapy/Group: Individual Therapy  Kyara Boxer 07/08/2016, 12:12 PM

## 2016-07-09 LAB — BASIC METABOLIC PANEL
ANION GAP: 8 (ref 5–15)
BUN: 29 mg/dL — ABNORMAL HIGH (ref 6–20)
CALCIUM: 8.9 mg/dL (ref 8.9–10.3)
CHLORIDE: 103 mmol/L (ref 101–111)
CO2: 26 mmol/L (ref 22–32)
CREATININE: 1.33 mg/dL — AB (ref 0.61–1.24)
GFR calc Af Amer: 59 mL/min — ABNORMAL LOW (ref >60)
GFR calc non Af Amer: 51 mL/min — ABNORMAL LOW (ref >60)
Glucose, Bld: 167 mg/dL — ABNORMAL HIGH (ref 65–99)
Potassium: 4.3 mmol/L (ref 3.5–5.1)
SODIUM: 137 mmol/L (ref 135–145)

## 2016-07-09 MED ORDER — CLOPIDOGREL BISULFATE 75 MG PO TABS: 75.0000 mg | ORAL_TABLET | Freq: Every day | ORAL | 1 refills | Status: DC

## 2016-07-09 MED ORDER — NICOTINE 21 MG/24HR TD PT24: 21.0000 mg | MEDICATED_PATCH | Freq: Every day | TRANSDERMAL | 0 refills | Status: DC | PRN

## 2016-07-09 MED ORDER — ROSUVASTATIN CALCIUM 20 MG PO TABS: 20.0000 mg | ORAL_TABLET | Freq: Every day | ORAL | 0 refills | Status: DC

## 2016-07-09 NOTE — Progress Notes (Signed)
A/O no noted distress. Educated patient discharge instruction and medication. Pt has all belongings. Staff transported via wheelchair safety to meet family.

## 2016-07-09 NOTE — Progress Notes (Signed)
Social Work  Discharge Note  The overall goal for the admission was met for:   Discharge location: [place] ON [facility].  Length of Stay: Yes-15 DAYS  Discharge activity level: Yes-MOD/I LEVEL  Home/community participation: Yes  Services provided included: MD, RD, PT, OT, SLP, RN, CM, TR, Pharmacy, Neuropsych and SW  Financial Services: Medicare and Private Insurance: [place]  Follow-up services arranged: Home Health: [facility]-PT,OT,RN and Patient/Family has no preference for HH/DME agencies  Comments (or additional information): BROTHER HAS GOTTEN A RAIL FOR PT'S STEPS AND MADE PT'S HOME AS ACCESSIBLE AS POSSIBLE. HAS PEOPLE IN PLACE TO PROVIDE TRANSPORTATION AND HOME MANAGEMENT. GIVEN PRIVATE DUTY LIST OF AGENCIES, IN CASE WANT TO HIRE ASSIST. PT AWARE TO QUIT SMOKING AND DOESN'T WANT ANY RESOURCES FOR THIS. FEELS CAN QUIT ON HIS OWN. HAS ALL NEEDED EQUIPMENT.  Patient/Family verbalized understanding of follow-up arrangements: Yes  Individual responsible for coordination of the follow-up plan: SELF & [name]-BROTHER  Confirmed correct DME delivered: [name] 07/09/2016    [name]

## 2016-07-09 NOTE — Progress Notes (Signed)
Subjective/Complaints: Trying to drink more fluids  ROS: Denies N/V/D, CP or SOB  Objective: Vital Signs: Blood pressure (!) 111/58, pulse (!) 44, temperature 97.9 F (36.6 C), temperature source Oral, resp. rate 18, weight 69 kg (152 lb 3.2 oz), SpO2 93 %. No results found. Results for orders placed or performed during the hospital encounter of 06/24/16 (from the past 72 hour(s))  Basic metabolic panel     Status: Abnormal   Collection Time: 07/07/16  5:16 AM  Result Value Ref Range   Sodium 136 135 - 145 mmol/L   Potassium 4.3 3.5 - 5.1 mmol/L   Chloride 104 101 - 111 mmol/L   CO2 26 22 - 32 mmol/L   Glucose, Bld 100 (H) 65 - 99 mg/dL   BUN 30 (H) 6 - 20 mg/dL   Creatinine, Ser 1.39 (H) 0.61 - 1.24 mg/dL   Calcium 8.7 (L) 8.9 - 10.3 mg/dL   GFR calc non Af Amer 48 (L) >60 mL/min   GFR calc Af Amer 56 (L) >60 mL/min    Comment: (NOTE) The eGFR has been calculated using the CKD EPI equation. This calculation has not been validated in all clinical situations. eGFR's persistently <60 mL/min signify possible Chronic Kidney Disease.    Anion gap 6 5 - 15  CBC with Differential/Platelet     Status: Abnormal   Collection Time: 07/07/16  5:16 AM  Result Value Ref Range   WBC 8.1 4.0 - 10.5 K/uL   RBC 4.76 4.22 - 5.81 MIL/uL   Hemoglobin 14.3 13.0 - 17.0 g/dL   HCT 43.2 39.0 - 52.0 %   MCV 90.8 78.0 - 100.0 fL   MCH 30.0 26.0 - 34.0 pg   MCHC 33.1 30.0 - 36.0 g/dL   RDW 14.0 11.5 - 15.5 %   Platelets 121 (L) 150 - 400 K/uL   Neutrophils Relative % 71 %   Neutro Abs 5.8 1.7 - 7.7 K/uL   Lymphocytes Relative 16 %   Lymphs Abs 1.3 0.7 - 4.0 K/uL   Monocytes Relative 9 %   Monocytes Absolute 0.7 0.1 - 1.0 K/uL   Eosinophils Relative 4 %   Eosinophils Absolute 0.3 0.0 - 0.7 K/uL   Basophils Relative 0 %   Basophils Absolute 0.0 0.0 - 0.1 K/uL  Creatinine, serum     Status: Abnormal   Collection Time: 07/08/16  5:10 AM  Result Value Ref Range   Creatinine, Ser 1.36 (H) 0.61  - 1.24 mg/dL   GFR calc non Af Amer 50 (L) >60 mL/min   GFR calc Af Amer 58 (L) >60 mL/min    Comment: (NOTE) The eGFR has been calculated using the CKD EPI equation. This calculation has not been validated in all clinical situations. eGFR's persistently <60 mL/min signify possible Chronic Kidney Disease.      HEENT: facial eczema. Atraumatic.  Cardio:IRR no jvd Resp: CTA B/L and unlabored GI: BS positive, ND Skin:   Rash dry erythemous plaques. Intact. Neuro: Alert/Oriented,  Motor: LUE/LLE: 4+/5 UE and LE (stable) Musc/Skel:  No edema No tenderness. Gen NAD. Well-developed  Assessment/Plan: 1. Functional deficits secondary to RIght Caudate infarct  Stable for D/C today F/u PCP in 3-4 weeks F/u PM&R 2 weeks See D/C summary See D/C instructions FIM: Function - Bathing Position: Shower Body parts bathed by patient: Right arm, Left arm, Chest, Abdomen, Front perineal area, Buttocks, Right upper leg, Left upper leg, Right lower leg, Left lower leg, Back Body parts bathed by helper:  Back Bathing not applicable: Back Assist Level: More than reasonable time Set up : To obtain items  Function- Upper Body Dressing/Undressing What is the patient wearing?: Pull over shirt/dress Pull over shirt/dress - Perfomed by patient: Thread/unthread right sleeve, Thread/unthread left sleeve, Put head through opening, Pull shirt over trunk Pull over shirt/dress - Perfomed by helper: Pull shirt over trunk Assist Level: No help, No cues Set up : To obtain clothing/put away Function - Lower Body Dressing/Undressing What is the patient wearing?: Underwear, Pants, Socks, Shoes Position: Sitting EOB Underwear - Performed by patient: Thread/unthread right underwear leg, Thread/unthread left underwear leg, Pull underwear up/down Pants- Performed by patient: Thread/unthread right pants leg, Thread/unthread left pants leg, Pull pants up/down, Fasten/unfasten pants Pants- Performed by helper:  Fasten/unfasten pants Non-skid slipper socks- Performed by helper: Don/doff right sock, Don/doff left sock Socks - Performed by patient: Don/doff right sock, Don/doff left sock Shoes - Performed by patient: Don/doff right shoe, Don/doff left shoe Assist for footwear: Independent Assist for lower body dressing: More than reasonable time  Function - Toileting Toileting activity did not occur: N/A Toileting steps completed by patient: Adjust clothing prior to toileting, Performs perineal hygiene, Adjust clothing after toileting Toileting steps completed by helper: Adjust clothing prior to toileting, Performs perineal hygiene, Adjust clothing after toileting Toileting Assistive Devices: Grab bar or rail Assist level: More than reasonable time  Function - Air cabin crew transfer assistive device: Walker Assist level to toilet: No Help, no cues, assistive device, takes more than a reasonable amount of time Assist level from toilet: No Help, no cues, assistive device, takes more than a reasonable amount of time  Function - Chair/bed transfer Chair/bed transfer method: Ambulatory Chair/bed transfer assist level: No Help, no cues, assistive device, takes more than a reasonable amount of time Chair/bed transfer assistive device: Walker, Armrests Chair/bed transfer details: Verbal cues for technique  Function - Locomotion: Wheelchair Will patient use wheelchair at discharge?: No Type: Manual Max wheelchair distance: 164f  Assist Level: Supervision or verbal cues Assist Level: Supervision or verbal cues Wheel 150 feet activity did not occur: Refused Assist Level: Supervision or verbal cues Turns around,maneuvers to table,bed, and toilet,negotiates 3% grade,maneuvers on rugs and over doorsills: No Function - Locomotion: Ambulation Assistive device: Walker-rolling Max distance: 70 Assist level: Supervision or verbal cues Assist level: Supervision or verbal cues Assist level:  Supervision or verbal cues Walk 150 feet activity did not occur: Safety/medical concerns Assist level: Supervision or verbal cues Walk 10 feet on uneven surfaces activity did not occur:  (with RW ) Assist level: Supervision or verbal cues  Function - Comprehension Comprehension: Auditory Comprehension assistive device: Hearing aids Comprehension assist level: Follows complex conversation/direction with no assist  Function - Expression Expression: Verbal Expression assist level: Expresses complex ideas: With no assist  Function - Social Interaction Social Interaction assist level: Interacts appropriately with others - No medications needed.  Function - Problem Solving Problem solving assist level: Solves complex problems: Recognizes & self-corrects  Function - Memory Memory assist level: Complete Independence: No helper Patient normally able to recall (first 3 days only): Current season, That he or she is in a hospital, Location of own room   Medical Problem List and Plan: 1. 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 DVT Prophylaxis/Anticoagulation: Subcutaneous Lovenox. Monitor platelet counts No signs of bleeding 3. Pain Management: Tylenol as  needed 4. Mood: Provide emotional support 5. Neuropsych: This patient iscapable of making decisions on hisown behalf. 6. Skin/Wound Care: Routine skin checks 7. Fluids/Electrolytes/Nutrition: Routine I&Os 8.Arteriosclerotic heart disease with stenting. Continue aspirin and Plavix 9.Diet-controlled diabetes mellitus. Hemoglobin A1c 5.8. CBG discontinued 10.History of hypertension. Resume lisinopril 10 mg daily as needed Vitals:   07/08/16 2130 07/09/16 0547  BP: (!) 149/61 (!) 111/58  Pulse: (!) 45 (!) 44  Resp: 18 18  Temp:  97.9 F (36.6 C)   Controlled  5/9 11.Tobacco abuse. Nicoderm patch. Provide counseling 12.Hyperlipidemia. Crestor 13.History of Thrombocytopenia. Follow-up plt stable at [facility] on 4/25  15.  Hypoalbuminemia- prostat 16.  Hx CAD s/p stent on chronic ASA and Plavix but had small vessel infarct nevertheless, heavy smoker, discussed with cardiology previously 17.  CKD, no nephrotoxic meds, BUN elevated discussed with RN, push fluids, 876m intake yesterday BMP Latest Ref Rng & Units 07/08/2016 07/07/2016 07/01/2016  Glucose 65 - 99 mg/dL - 100(H) -  BUN 6 - 20 mg/dL - 30(H) -  Creatinine 0.61 - 1.24 mg/dL 1.36(H) 1.39(H) 1.43(H)  Sodium 135 - 145 mmol/L - 136 -  Potassium 3.5 - 5.1 mmol/L - 4.3 -  Chloride 101 - 111 mmol/L - 104 -  CO2 22 - 32 mmol/L - 26 -  Calcium 8.9 - 10.3 mg/dL - 8.7(L) -    18.  Poor toenail hygiene borderline diabetic needs podiatry f/u as OP 19.  Right hand resting tremor, father had Parkinson's disease, pt has had tremor for 229yrnot evaled by neurology 20.  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

## 2016-07-10 ENCOUNTER — Telehealth: Payer: Self-pay | Admitting: Family Medicine

## 2016-07-10 NOTE — Telephone Encounter (Signed)
Called pt concerning recent hospital stay. Pt is at home. Pt states he is feeling fine. Reconciled the current and discharge meds. He states that the hospital D/C his bp med. All other meds stayed the same. Meds are all filled at home. Pt was aware of follow up made by the hospital is is planning on being at that appointment which is 07/18/2016. Pt was informed of after hour protocol and to bring all medications with him to his appointment. Pt was informed if he needed anything between now and his appt to call the office. Pt verified understanding.

## 2016-07-11 ENCOUNTER — Telehealth: Payer: Self-pay | Admitting: Family Medicine

## 2016-07-11 DIAGNOSIS — I2510 Atherosclerotic heart disease of native coronary artery without angina pectoris: Secondary | ICD-10-CM | POA: Diagnosis not present

## 2016-07-11 DIAGNOSIS — I130 Hypertensive heart and chronic kidney disease with heart failure and stage 1 through stage 4 chronic kidney disease, or unspecified chronic kidney disease: Secondary | ICD-10-CM | POA: Diagnosis not present

## 2016-07-11 DIAGNOSIS — J449 Chronic obstructive pulmonary disease, unspecified: Secondary | ICD-10-CM | POA: Diagnosis not present

## 2016-07-11 DIAGNOSIS — N183 Chronic kidney disease, stage 3 (moderate): Secondary | ICD-10-CM | POA: Diagnosis not present

## 2016-07-11 DIAGNOSIS — E785 Hyperlipidemia, unspecified: Secondary | ICD-10-CM | POA: Diagnosis not present

## 2016-07-11 DIAGNOSIS — E1122 Type 2 diabetes mellitus with diabetic chronic kidney disease: Secondary | ICD-10-CM | POA: Diagnosis not present

## 2016-07-11 DIAGNOSIS — I69354 Hemiplegia and hemiparesis following cerebral infarction affecting left non-dominant side: Secondary | ICD-10-CM | POA: Diagnosis not present

## 2016-07-11 DIAGNOSIS — Z87891 Personal history of nicotine dependence: Secondary | ICD-10-CM | POA: Diagnosis not present

## 2016-07-11 DIAGNOSIS — I5043 Acute on chronic combined systolic (congestive) and diastolic (congestive) heart failure: Secondary | ICD-10-CM | POA: Diagnosis not present

## 2016-07-11 DIAGNOSIS — D696 Thrombocytopenia, unspecified: Secondary | ICD-10-CM | POA: Diagnosis not present

## 2016-07-11 NOTE — Telephone Encounter (Signed)
Called Dr. Redmond School gave verbal ok for Home health but he wanted more info on pt's well being.  Called [place] and she states pt was in rehab for a while and is now at home.  Pt is doing well, she is dressing himself, he is living alone and his brother comes once a week to help him.  His neighbor come over and helps with food.  He is only on 3 medications Crestor, Plavix.  States pt was very adament about staying at home.  Says he is doing well but does need some assistance.

## 2016-07-11 NOTE — Telephone Encounter (Signed)
Jenny Reichmann with [facility] t# [phone] called & states pt just got out of the hospital and they need order for skilled nursing for med mgmt and disease mgmt for 1 time a week for 6 weeks.  They are also doing PT eval. She needs a verbal order as soon as possible.

## 2016-07-11 NOTE — Telephone Encounter (Signed)
Thank you :)

## 2016-07-12 DIAGNOSIS — E1122 Type 2 diabetes mellitus with diabetic chronic kidney disease: Secondary | ICD-10-CM | POA: Diagnosis not present

## 2016-07-12 DIAGNOSIS — I130 Hypertensive heart and chronic kidney disease with heart failure and stage 1 through stage 4 chronic kidney disease, or unspecified chronic kidney disease: Secondary | ICD-10-CM | POA: Diagnosis not present

## 2016-07-12 DIAGNOSIS — I69354 Hemiplegia and hemiparesis following cerebral infarction affecting left non-dominant side: Secondary | ICD-10-CM | POA: Diagnosis not present

## 2016-07-12 DIAGNOSIS — N183 Chronic kidney disease, stage 3 (moderate): Secondary | ICD-10-CM | POA: Diagnosis not present

## 2016-07-12 DIAGNOSIS — I2510 Atherosclerotic heart disease of native coronary artery without angina pectoris: Secondary | ICD-10-CM | POA: Diagnosis not present

## 2016-07-12 DIAGNOSIS — I5043 Acute on chronic combined systolic (congestive) and diastolic (congestive) heart failure: Secondary | ICD-10-CM | POA: Diagnosis not present

## 2016-07-14 DIAGNOSIS — I130 Hypertensive heart and chronic kidney disease with heart failure and stage 1 through stage 4 chronic kidney disease, or unspecified chronic kidney disease: Secondary | ICD-10-CM | POA: Diagnosis not present

## 2016-07-14 DIAGNOSIS — E1122 Type 2 diabetes mellitus with diabetic chronic kidney disease: Secondary | ICD-10-CM | POA: Diagnosis not present

## 2016-07-14 DIAGNOSIS — I69354 Hemiplegia and hemiparesis following cerebral infarction affecting left non-dominant side: Secondary | ICD-10-CM | POA: Diagnosis not present

## 2016-07-14 DIAGNOSIS — I5043 Acute on chronic combined systolic (congestive) and diastolic (congestive) heart failure: Secondary | ICD-10-CM | POA: Diagnosis not present

## 2016-07-14 DIAGNOSIS — N183 Chronic kidney disease, stage 3 (moderate): Secondary | ICD-10-CM | POA: Diagnosis not present

## 2016-07-14 DIAGNOSIS — I2510 Atherosclerotic heart disease of native coronary artery without angina pectoris: Secondary | ICD-10-CM | POA: Diagnosis not present

## 2016-07-15 ENCOUNTER — Telehealth: Payer: Self-pay | Admitting: Family Medicine

## 2016-07-15 DIAGNOSIS — E1122 Type 2 diabetes mellitus with diabetic chronic kidney disease: Secondary | ICD-10-CM | POA: Diagnosis not present

## 2016-07-15 DIAGNOSIS — I130 Hypertensive heart and chronic kidney disease with heart failure and stage 1 through stage 4 chronic kidney disease, or unspecified chronic kidney disease: Secondary | ICD-10-CM | POA: Diagnosis not present

## 2016-07-15 DIAGNOSIS — I69354 Hemiplegia and hemiparesis following cerebral infarction affecting left non-dominant side: Secondary | ICD-10-CM | POA: Diagnosis not present

## 2016-07-15 DIAGNOSIS — I5043 Acute on chronic combined systolic (congestive) and diastolic (congestive) heart failure: Secondary | ICD-10-CM | POA: Diagnosis not present

## 2016-07-15 DIAGNOSIS — N183 Chronic kidney disease, stage 3 (moderate): Secondary | ICD-10-CM | POA: Diagnosis not present

## 2016-07-15 DIAGNOSIS — I2510 Atherosclerotic heart disease of native coronary artery without angina pectoris: Secondary | ICD-10-CM | POA: Diagnosis not present

## 2016-07-15 NOTE — Telephone Encounter (Signed)
Occupational therapist from [facility], called to get authorization for occupational therapy for one time per week every other week for 2 weeks, then one time per week for 4 weeks. Call Rosann Auerbach at [phone]

## 2016-07-15 NOTE — Telephone Encounter (Signed)
[facility] wanted to call and let you know that pt heart rate was 46-48 resting and walking was 56-58 and his bp was 140/70 resting, states no dizziness, states he was real relaxed, she just wanted to let you now she can be reached at [phone] with any questions,

## 2016-07-15 NOTE — Telephone Encounter (Signed)
ok 

## 2016-07-15 NOTE — Telephone Encounter (Signed)
Called Rosann Auerbach to give Okay she verbalized understanding

## 2016-07-15 NOTE — Telephone Encounter (Signed)
Make sure that his cardiologist knows about bradycardia

## 2016-07-16 DIAGNOSIS — E1122 Type 2 diabetes mellitus with diabetic chronic kidney disease: Secondary | ICD-10-CM | POA: Diagnosis not present

## 2016-07-16 DIAGNOSIS — I2510 Atherosclerotic heart disease of native coronary artery without angina pectoris: Secondary | ICD-10-CM | POA: Diagnosis not present

## 2016-07-16 DIAGNOSIS — I5043 Acute on chronic combined systolic (congestive) and diastolic (congestive) heart failure: Secondary | ICD-10-CM | POA: Diagnosis not present

## 2016-07-16 DIAGNOSIS — I69354 Hemiplegia and hemiparesis following cerebral infarction affecting left non-dominant side: Secondary | ICD-10-CM | POA: Diagnosis not present

## 2016-07-16 DIAGNOSIS — N183 Chronic kidney disease, stage 3 (moderate): Secondary | ICD-10-CM | POA: Diagnosis not present

## 2016-07-16 DIAGNOSIS — I130 Hypertensive heart and chronic kidney disease with heart failure and stage 1 through stage 4 chronic kidney disease, or unspecified chronic kidney disease: Secondary | ICD-10-CM | POA: Diagnosis not present

## 2016-07-16 NOTE — Telephone Encounter (Signed)
Spoke to patient and he states that his cardiologist is aware that his pulse is always low and that she wasn't concerned about it.

## 2016-07-18 ENCOUNTER — Encounter: Payer: Medicare Other | Attending: Physical Medicine & Rehabilitation

## 2016-07-18 ENCOUNTER — Ambulatory Visit (INDEPENDENT_AMBULATORY_CARE_PROVIDER_SITE_OTHER): Payer: Medicare Other | Admitting: Family Medicine

## 2016-07-18 ENCOUNTER — Encounter: Payer: Self-pay | Admitting: Family Medicine

## 2016-07-18 ENCOUNTER — Ambulatory Visit (HOSPITAL_BASED_OUTPATIENT_CLINIC_OR_DEPARTMENT_OTHER): Payer: Medicare Other | Admitting: Physical Medicine & Rehabilitation

## 2016-07-18 ENCOUNTER — Encounter: Payer: Self-pay | Admitting: Physical Medicine & Rehabilitation

## 2016-07-18 VITALS — BP 132/70 | HR 50 | Ht 68.0 in | Wt 147.6 lb

## 2016-07-18 VITALS — BP 153/69 | HR 43 | Resp 14

## 2016-07-18 DIAGNOSIS — F17210 Nicotine dependence, cigarettes, uncomplicated: Secondary | ICD-10-CM | POA: Diagnosis not present

## 2016-07-18 DIAGNOSIS — I6381 Other cerebral infarction due to occlusion or stenosis of small artery: Secondary | ICD-10-CM

## 2016-07-18 DIAGNOSIS — E785 Hyperlipidemia, unspecified: Secondary | ICD-10-CM | POA: Insufficient documentation

## 2016-07-18 DIAGNOSIS — R269 Unspecified abnormalities of gait and mobility: Secondary | ICD-10-CM | POA: Insufficient documentation

## 2016-07-18 DIAGNOSIS — I10 Essential (primary) hypertension: Secondary | ICD-10-CM | POA: Insufficient documentation

## 2016-07-18 DIAGNOSIS — E119 Type 2 diabetes mellitus without complications: Secondary | ICD-10-CM

## 2016-07-18 DIAGNOSIS — I5043 Acute on chronic combined systolic (congestive) and diastolic (congestive) heart failure: Secondary | ICD-10-CM

## 2016-07-18 DIAGNOSIS — I639 Cerebral infarction, unspecified: Secondary | ICD-10-CM | POA: Diagnosis not present

## 2016-07-18 DIAGNOSIS — I69398 Other sequelae of cerebral infarction: Secondary | ICD-10-CM | POA: Insufficient documentation

## 2016-07-18 DIAGNOSIS — N183 Chronic kidney disease, stage 3 unspecified: Secondary | ICD-10-CM

## 2016-07-18 NOTE — Progress Notes (Signed)
   Subjective:    Patient ID: Donald Richardson, male    DOB: 03/26/1940, 76 y.o.   MRN: [id number]  HPI He is here for recheck after recent hospitalization and visit to rehabilitation. He was seen on April 20 and diagnosed with a right-sided CVA. He was too long past the initial event to have any intervention done. He was then sent to rehabilitation on April 24 and left on May 9. While in the rehabilitation he was getting PT and OT. Since being home PT, OT, nursing has continued to visit. He was seen earlier today by Dr. Joan Mayans. He has no particular concerns or complaints. He is able to feed and bathe himself. He is using a walker. He knows not to drive.    Review of Systems     Objective:   Physical Exam Alert and in no distress. Able to voice his concerns. No obvious motor weakness is noted. Tympanic membranes clear. Throat is clear. Neck is supple without adenopathy. Cardiac exam does show a bradycardia with distant heart sounds. Lungs are clear to auscultation. He does use a walker. His medications were reviewed. His blood pressure is under good control. No evidence of worsening of his CK D. Diet-controlled diabetes is status quo.      Assessment & Plan:  Basal ganglia infarction [facility])  Essential hypertension  Acute on chronic combined systolic and diastolic CHF (congestive heart failure) (HCC)  CKD (chronic kidney disease), stage III  Diabetes mellitus type 2 in nonobese ([place]) Will continue with PT, OT as well as using the walker. Again caution him against driving until he was given permission to do this. He is to recheck with me in mid July.

## 2016-07-18 NOTE — Progress Notes (Signed)
Subjective:    Patient ID: Donald Richardson, male    DOB: 11-18-1940, 76 y.o.   MRN: [id number] DATE OF ADMISSION:  06/24/2016 DATE OF DISCHARGE:  07/09/2016 76 year old right-handed male with history of tobacco abuse, COPD, thrombocytopenia, arteriosclerotic heart disease with stenting, diet controlled diabetes mellitus.  Lives alone, independent prior to admission.  Closest family is in [place] being his brother, who can assist as needed.  Presented June 20, 2016, with left-sided weakness and a fall x2 without loss of consciousness.  CT of the head showed a 9 mm hyperdense focus near the head of the right caudate, no significant surrounding edema or mass effect.  MRI showed right caudate infarct.  MRA unremarkable.  Carotid Dopplers with no ICA stenosis.  Echocardiogram with ejection fraction of 55% and grade 2 diastolic dysfunction.  Neurology consulted, maintained on aspirin and Plavix for CVA prophylaxis HPI Patient is eager to start driving again Home health PT, OT, RN, SW He has resumed smoking and no longer uses the NicoDerm patch  Dressing and bathing Mod I Amb with walker He is currently not driving, his neighbor drove him to clinic today Pain Inventory Average Pain 0 Pain Right Now 0 My pain is no pain  In the last 24 hours, has pain interfered with the following? General activity 0 Relation with others 0 Enjoyment of life 0 What TIME of day is your pain at its worst? no pain Sleep (in general) Good  Pain is worse with: no pain Pain improves with: no pain Relief from Meds: no pain  Mobility walk without assistance walk with assistance use a walker Do you have any goals in this area?  yes  Function retired  Neuro/Psych No problems in this area  Prior Studies hospital follow up  Physicians involved in your care hospital follow up   Family History  Problem Relation Age of Onset  . Breast cancer Mother   . Parkinson's disease  Father    Social History   Social History  . Marital status: Single    Spouse name: N/A  . Number of children: N/A  . Years of education: N/A   Social History Main Topics  . Smoking status: Current Every Day Smoker    Packs/day: 1.00    Types: Cigarettes  . Smokeless tobacco: Never Used  . Alcohol use No  . Drug use: No  . Sexual activity: Not Currently   Other Topics Concern  . None   Social History Narrative  . None   Past Surgical History:  Procedure Laterality Date  . INGUINAL HERNIA REPAIR Left 10/26/2015   Procedure: LEFT INGUINAL HERNIA REPAIR WITH MESH;  Surgeon: Jackolyn Confer, MD;  Location: [facility];  Service: General;  Laterality: Left;  . INSERTION OF MESH Left 10/26/2015   Procedure: INSERTION OF MESH;  Surgeon: Jackolyn Confer, MD;  Location: [facility];  Service: General;  Laterality: Left;  . NO PAST SURGERIES     Past Medical History:  Diagnosis Date  . ASHD (arteriosclerotic heart disease)    STENT  . COPD (chronic obstructive pulmonary disease) ([place])   . Diabetes mellitus    Hgb A1C- 5.8 in May 2017  . Hemorrhoids   . Hyperlipidemia   . Hypertension   . Smoker    BP (!) 153/69 (BP Location: Left Arm, Patient Position: Sitting, Cuff Size: Normal)   Pulse (!) 43   Resp 14   SpO2 91%   Opioid Risk Score:   Fall Risk  Score:  `1  Depression screen PHQ 2/9  Depression screen [facility] 2/9 09/18/2015 02/27/2014 02/15/2013 10/22/2011 06/18/2011  Decreased Interest 0 0 0 0 0  Down, Depressed, Hopeless 0 0 0 0 0  PHQ - 2 Score 0 0 0 0 0    Review of Systems  Constitutional: Negative.   HENT: Negative.   Eyes: Negative.   Respiratory: Negative.   Cardiovascular: Negative.   Gastrointestinal: Negative.   Endocrine: Negative.   Genitourinary: Negative.   Musculoskeletal: Negative.   Skin: Negative.   Allergic/Immunologic: Negative.   Neurological: Negative.   Hematological: Negative.   Psychiatric/Behavioral: Negative.   All other systems reviewed  and are negative.      Objective:   Physical Exam  Constitutional: He is oriented to person, place, and time. He appears well-developed and well-nourished.  HENT:  Head: Normocephalic and atraumatic.  Eyes: Conjunctivae and EOM are normal. Pupils are equal, round, and reactive to light.  Neck: Normal range of motion.  Cardiovascular: Normal rate, regular rhythm and normal heart sounds.   No murmur heard. Pulmonary/Chest: Effort normal and breath sounds normal. No respiratory distress. He has no wheezes.  Abdominal: Soft. Bowel sounds are normal. He exhibits no distension. There is no tenderness.  Musculoskeletal: Normal range of motion. He exhibits no edema or tenderness.  Neurological: He is alert and oriented to person, place, and time. He displays tremor. No sensory deficit. Gait abnormal.  Motor strength is 5/5 bilateral deltoids, biceps, triceps, grip, hip flexors, knee extensors, ankle dorsiflexors. He has mild resting tremor of the right hand Masked facies Gait is without evidence of toe drag or knee instability. He can ambulate with supervision assistance without a walker. He has a slow, shuffling gait, wide basis support.  Psychiatric: Thought content normal. His affect is blunt. His speech is delayed. He is slowed.  Nursing note and vitals reviewed.         Assessment & Plan:  1. 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 He is not back to baseline yet. We discussed that he should continue physical therapy for at least a couple more weeks. We'll reevaluate in 1 month and at that point if he is not back to baseline with walking. Would recommend outpatient PT, doubt if he will need outpatient OT  He would like to resume driving. I do think he should be ready in a few weeks. He would like to have an evaluation to see if he can start sooner. We will give him pamphlets for driver's evaluation and he can call to schedule, I did indicate that I would have to review the  report and give him the okay to resume  We'll continue clopidogrel and aspirin for secondary stroke prophylaxis  2. Parkinsonian features. He will follow up with neurology  3. Hyperlipidemia. Follow-up with primary care physician

## 2016-07-18 NOTE — Patient Instructions (Signed)
Please call number on pamphlets. To schedule driver evaluation. They will send me a report, and I will evaluate that report. I will see back in 4 weeks. If you're still unsteady with her walking. We may need to send you to outpatient therapy.

## 2016-07-21 DIAGNOSIS — I130 Hypertensive heart and chronic kidney disease with heart failure and stage 1 through stage 4 chronic kidney disease, or unspecified chronic kidney disease: Secondary | ICD-10-CM | POA: Diagnosis not present

## 2016-07-21 DIAGNOSIS — I5043 Acute on chronic combined systolic (congestive) and diastolic (congestive) heart failure: Secondary | ICD-10-CM | POA: Diagnosis not present

## 2016-07-21 DIAGNOSIS — N183 Chronic kidney disease, stage 3 (moderate): Secondary | ICD-10-CM | POA: Diagnosis not present

## 2016-07-21 DIAGNOSIS — E1122 Type 2 diabetes mellitus with diabetic chronic kidney disease: Secondary | ICD-10-CM | POA: Diagnosis not present

## 2016-07-21 DIAGNOSIS — I69354 Hemiplegia and hemiparesis following cerebral infarction affecting left non-dominant side: Secondary | ICD-10-CM | POA: Diagnosis not present

## 2016-07-21 DIAGNOSIS — I2510 Atherosclerotic heart disease of native coronary artery without angina pectoris: Secondary | ICD-10-CM | POA: Diagnosis not present

## 2016-07-23 DIAGNOSIS — E1122 Type 2 diabetes mellitus with diabetic chronic kidney disease: Secondary | ICD-10-CM | POA: Diagnosis not present

## 2016-07-23 DIAGNOSIS — I5043 Acute on chronic combined systolic (congestive) and diastolic (congestive) heart failure: Secondary | ICD-10-CM | POA: Diagnosis not present

## 2016-07-23 DIAGNOSIS — I2510 Atherosclerotic heart disease of native coronary artery without angina pectoris: Secondary | ICD-10-CM | POA: Diagnosis not present

## 2016-07-23 DIAGNOSIS — N183 Chronic kidney disease, stage 3 (moderate): Secondary | ICD-10-CM | POA: Diagnosis not present

## 2016-07-23 DIAGNOSIS — I69354 Hemiplegia and hemiparesis following cerebral infarction affecting left non-dominant side: Secondary | ICD-10-CM | POA: Diagnosis not present

## 2016-07-23 DIAGNOSIS — I130 Hypertensive heart and chronic kidney disease with heart failure and stage 1 through stage 4 chronic kidney disease, or unspecified chronic kidney disease: Secondary | ICD-10-CM | POA: Diagnosis not present

## 2016-07-24 DIAGNOSIS — I2510 Atherosclerotic heart disease of native coronary artery without angina pectoris: Secondary | ICD-10-CM | POA: Diagnosis not present

## 2016-07-24 DIAGNOSIS — I130 Hypertensive heart and chronic kidney disease with heart failure and stage 1 through stage 4 chronic kidney disease, or unspecified chronic kidney disease: Secondary | ICD-10-CM | POA: Diagnosis not present

## 2016-07-24 DIAGNOSIS — I69354 Hemiplegia and hemiparesis following cerebral infarction affecting left non-dominant side: Secondary | ICD-10-CM | POA: Diagnosis not present

## 2016-07-24 DIAGNOSIS — N183 Chronic kidney disease, stage 3 (moderate): Secondary | ICD-10-CM | POA: Diagnosis not present

## 2016-07-24 DIAGNOSIS — I5043 Acute on chronic combined systolic (congestive) and diastolic (congestive) heart failure: Secondary | ICD-10-CM | POA: Diagnosis not present

## 2016-07-24 DIAGNOSIS — E1122 Type 2 diabetes mellitus with diabetic chronic kidney disease: Secondary | ICD-10-CM | POA: Diagnosis not present

## 2016-07-25 ENCOUNTER — Inpatient Hospital Stay: Payer: Medicare Other | Admitting: Physical Medicine & Rehabilitation

## 2016-07-28 DIAGNOSIS — I2510 Atherosclerotic heart disease of native coronary artery without angina pectoris: Secondary | ICD-10-CM | POA: Diagnosis not present

## 2016-07-28 DIAGNOSIS — I5043 Acute on chronic combined systolic (congestive) and diastolic (congestive) heart failure: Secondary | ICD-10-CM | POA: Diagnosis not present

## 2016-07-28 DIAGNOSIS — N183 Chronic kidney disease, stage 3 (moderate): Secondary | ICD-10-CM | POA: Diagnosis not present

## 2016-07-28 DIAGNOSIS — I69354 Hemiplegia and hemiparesis following cerebral infarction affecting left non-dominant side: Secondary | ICD-10-CM | POA: Diagnosis not present

## 2016-07-28 DIAGNOSIS — E1122 Type 2 diabetes mellitus with diabetic chronic kidney disease: Secondary | ICD-10-CM | POA: Diagnosis not present

## 2016-07-28 DIAGNOSIS — I130 Hypertensive heart and chronic kidney disease with heart failure and stage 1 through stage 4 chronic kidney disease, or unspecified chronic kidney disease: Secondary | ICD-10-CM | POA: Diagnosis not present

## 2016-07-29 ENCOUNTER — Telehealth: Payer: Self-pay | Admitting: Family Medicine

## 2016-07-29 NOTE — Telephone Encounter (Signed)
[name] called, he is patients' brother.  He is not on HIPAA.  [name] wanted to make sure that Dr. Redmond School was aware that Donald Richardson has all the characteristics of Parkinson's disease.  He shuffles when he walks, he drools, his head droops, tremors.  [name] and [name]'s father had Parkinson's disease.  [name]'s wish is that you consider that for him, so that there may have  treatment for the tremors and gait issue if you find he has this.    [name] is not progressing well with OT, PT.  [name] will attempt to come with pt in July.  [name] request a phone call from you, but understands not probable, only wants to make sure you have this info.

## 2016-07-30 DIAGNOSIS — I2510 Atherosclerotic heart disease of native coronary artery without angina pectoris: Secondary | ICD-10-CM | POA: Diagnosis not present

## 2016-07-30 DIAGNOSIS — I5043 Acute on chronic combined systolic (congestive) and diastolic (congestive) heart failure: Secondary | ICD-10-CM | POA: Diagnosis not present

## 2016-07-30 DIAGNOSIS — N183 Chronic kidney disease, stage 3 (moderate): Secondary | ICD-10-CM | POA: Diagnosis not present

## 2016-07-30 DIAGNOSIS — I130 Hypertensive heart and chronic kidney disease with heart failure and stage 1 through stage 4 chronic kidney disease, or unspecified chronic kidney disease: Secondary | ICD-10-CM | POA: Diagnosis not present

## 2016-07-30 DIAGNOSIS — I69354 Hemiplegia and hemiparesis following cerebral infarction affecting left non-dominant side: Secondary | ICD-10-CM | POA: Diagnosis not present

## 2016-07-30 DIAGNOSIS — E1122 Type 2 diabetes mellitus with diabetic chronic kidney disease: Secondary | ICD-10-CM | POA: Diagnosis not present

## 2016-07-31 DIAGNOSIS — I2510 Atherosclerotic heart disease of native coronary artery without angina pectoris: Secondary | ICD-10-CM | POA: Diagnosis not present

## 2016-07-31 DIAGNOSIS — I5043 Acute on chronic combined systolic (congestive) and diastolic (congestive) heart failure: Secondary | ICD-10-CM | POA: Diagnosis not present

## 2016-07-31 DIAGNOSIS — I130 Hypertensive heart and chronic kidney disease with heart failure and stage 1 through stage 4 chronic kidney disease, or unspecified chronic kidney disease: Secondary | ICD-10-CM | POA: Diagnosis not present

## 2016-07-31 DIAGNOSIS — E1122 Type 2 diabetes mellitus with diabetic chronic kidney disease: Secondary | ICD-10-CM | POA: Diagnosis not present

## 2016-07-31 DIAGNOSIS — N183 Chronic kidney disease, stage 3 (moderate): Secondary | ICD-10-CM | POA: Diagnosis not present

## 2016-07-31 DIAGNOSIS — I69354 Hemiplegia and hemiparesis following cerebral infarction affecting left non-dominant side: Secondary | ICD-10-CM | POA: Diagnosis not present

## 2016-08-05 DIAGNOSIS — N183 Chronic kidney disease, stage 3 (moderate): Secondary | ICD-10-CM | POA: Diagnosis not present

## 2016-08-05 DIAGNOSIS — I130 Hypertensive heart and chronic kidney disease with heart failure and stage 1 through stage 4 chronic kidney disease, or unspecified chronic kidney disease: Secondary | ICD-10-CM | POA: Diagnosis not present

## 2016-08-05 DIAGNOSIS — I69354 Hemiplegia and hemiparesis following cerebral infarction affecting left non-dominant side: Secondary | ICD-10-CM | POA: Diagnosis not present

## 2016-08-05 DIAGNOSIS — I5043 Acute on chronic combined systolic (congestive) and diastolic (congestive) heart failure: Secondary | ICD-10-CM | POA: Diagnosis not present

## 2016-08-05 DIAGNOSIS — E1122 Type 2 diabetes mellitus with diabetic chronic kidney disease: Secondary | ICD-10-CM | POA: Diagnosis not present

## 2016-08-05 DIAGNOSIS — I2510 Atherosclerotic heart disease of native coronary artery without angina pectoris: Secondary | ICD-10-CM | POA: Diagnosis not present

## 2016-08-08 DIAGNOSIS — N183 Chronic kidney disease, stage 3 (moderate): Secondary | ICD-10-CM | POA: Diagnosis not present

## 2016-08-08 DIAGNOSIS — E1122 Type 2 diabetes mellitus with diabetic chronic kidney disease: Secondary | ICD-10-CM | POA: Diagnosis not present

## 2016-08-08 DIAGNOSIS — I2510 Atherosclerotic heart disease of native coronary artery without angina pectoris: Secondary | ICD-10-CM | POA: Diagnosis not present

## 2016-08-08 DIAGNOSIS — I69354 Hemiplegia and hemiparesis following cerebral infarction affecting left non-dominant side: Secondary | ICD-10-CM | POA: Diagnosis not present

## 2016-08-08 DIAGNOSIS — I130 Hypertensive heart and chronic kidney disease with heart failure and stage 1 through stage 4 chronic kidney disease, or unspecified chronic kidney disease: Secondary | ICD-10-CM | POA: Diagnosis not present

## 2016-08-08 DIAGNOSIS — I5043 Acute on chronic combined systolic (congestive) and diastolic (congestive) heart failure: Secondary | ICD-10-CM | POA: Diagnosis not present

## 2016-08-12 DIAGNOSIS — E1122 Type 2 diabetes mellitus with diabetic chronic kidney disease: Secondary | ICD-10-CM | POA: Diagnosis not present

## 2016-08-12 DIAGNOSIS — I2510 Atherosclerotic heart disease of native coronary artery without angina pectoris: Secondary | ICD-10-CM | POA: Diagnosis not present

## 2016-08-12 DIAGNOSIS — N183 Chronic kidney disease, stage 3 (moderate): Secondary | ICD-10-CM | POA: Diagnosis not present

## 2016-08-12 DIAGNOSIS — I130 Hypertensive heart and chronic kidney disease with heart failure and stage 1 through stage 4 chronic kidney disease, or unspecified chronic kidney disease: Secondary | ICD-10-CM | POA: Diagnosis not present

## 2016-08-12 DIAGNOSIS — I5043 Acute on chronic combined systolic (congestive) and diastolic (congestive) heart failure: Secondary | ICD-10-CM | POA: Diagnosis not present

## 2016-08-12 DIAGNOSIS — I69354 Hemiplegia and hemiparesis following cerebral infarction affecting left non-dominant side: Secondary | ICD-10-CM | POA: Diagnosis not present

## 2016-08-14 DIAGNOSIS — I130 Hypertensive heart and chronic kidney disease with heart failure and stage 1 through stage 4 chronic kidney disease, or unspecified chronic kidney disease: Secondary | ICD-10-CM | POA: Diagnosis not present

## 2016-08-14 DIAGNOSIS — I2510 Atherosclerotic heart disease of native coronary artery without angina pectoris: Secondary | ICD-10-CM | POA: Diagnosis not present

## 2016-08-14 DIAGNOSIS — N183 Chronic kidney disease, stage 3 (moderate): Secondary | ICD-10-CM | POA: Diagnosis not present

## 2016-08-14 DIAGNOSIS — I5043 Acute on chronic combined systolic (congestive) and diastolic (congestive) heart failure: Secondary | ICD-10-CM | POA: Diagnosis not present

## 2016-08-14 DIAGNOSIS — I69354 Hemiplegia and hemiparesis following cerebral infarction affecting left non-dominant side: Secondary | ICD-10-CM | POA: Diagnosis not present

## 2016-08-14 DIAGNOSIS — E1122 Type 2 diabetes mellitus with diabetic chronic kidney disease: Secondary | ICD-10-CM | POA: Diagnosis not present

## 2016-08-19 ENCOUNTER — Ambulatory Visit: Payer: Medicare Other | Admitting: Physical Medicine & Rehabilitation

## 2016-08-19 ENCOUNTER — Encounter: Payer: Medicare Other | Attending: Physical Medicine & Rehabilitation

## 2016-08-19 DIAGNOSIS — I5043 Acute on chronic combined systolic (congestive) and diastolic (congestive) heart failure: Secondary | ICD-10-CM | POA: Diagnosis not present

## 2016-08-19 DIAGNOSIS — I69354 Hemiplegia and hemiparesis following cerebral infarction affecting left non-dominant side: Secondary | ICD-10-CM | POA: Diagnosis not present

## 2016-08-19 DIAGNOSIS — I2510 Atherosclerotic heart disease of native coronary artery without angina pectoris: Secondary | ICD-10-CM | POA: Diagnosis not present

## 2016-08-19 DIAGNOSIS — N183 Chronic kidney disease, stage 3 (moderate): Secondary | ICD-10-CM | POA: Diagnosis not present

## 2016-08-19 DIAGNOSIS — I130 Hypertensive heart and chronic kidney disease with heart failure and stage 1 through stage 4 chronic kidney disease, or unspecified chronic kidney disease: Secondary | ICD-10-CM | POA: Diagnosis not present

## 2016-08-19 DIAGNOSIS — E785 Hyperlipidemia, unspecified: Secondary | ICD-10-CM | POA: Insufficient documentation

## 2016-08-19 DIAGNOSIS — R269 Unspecified abnormalities of gait and mobility: Secondary | ICD-10-CM | POA: Insufficient documentation

## 2016-08-19 DIAGNOSIS — I69398 Other sequelae of cerebral infarction: Secondary | ICD-10-CM | POA: Insufficient documentation

## 2016-08-19 DIAGNOSIS — E1122 Type 2 diabetes mellitus with diabetic chronic kidney disease: Secondary | ICD-10-CM | POA: Diagnosis not present

## 2016-08-19 DIAGNOSIS — F17210 Nicotine dependence, cigarettes, uncomplicated: Secondary | ICD-10-CM | POA: Insufficient documentation

## 2016-08-19 DIAGNOSIS — I10 Essential (primary) hypertension: Secondary | ICD-10-CM | POA: Insufficient documentation

## 2016-08-20 ENCOUNTER — Emergency Department (HOSPITAL_COMMUNITY)
Admission: EM | Admit: 2016-08-20 | Discharge: 2016-08-20 | Disposition: A | Discharge status: Home / Self Care | Payer: Medicare Other | Attending: Emergency Medicine | Admitting: Emergency Medicine

## 2016-08-20 ENCOUNTER — Emergency Department (HOSPITAL_COMMUNITY): Payer: Medicare Other

## 2016-08-20 ENCOUNTER — Telehealth: Payer: Self-pay | Admitting: Family Medicine

## 2016-08-20 ENCOUNTER — Encounter (HOSPITAL_COMMUNITY): Payer: Self-pay

## 2016-08-20 DIAGNOSIS — Y998 Other external cause status: Secondary | ICD-10-CM | POA: Diagnosis not present

## 2016-08-20 DIAGNOSIS — W010XXA Fall on same level from slipping, tripping and stumbling without subsequent striking against object, initial encounter: Secondary | ICD-10-CM | POA: Insufficient documentation

## 2016-08-20 DIAGNOSIS — R079 Chest pain, unspecified: Secondary | ICD-10-CM | POA: Diagnosis not present

## 2016-08-20 DIAGNOSIS — N183 Chronic kidney disease, stage 3 (moderate): Secondary | ICD-10-CM | POA: Insufficient documentation

## 2016-08-20 DIAGNOSIS — I129 Hypertensive chronic kidney disease with stage 1 through stage 4 chronic kidney disease, or unspecified chronic kidney disease: Secondary | ICD-10-CM | POA: Insufficient documentation

## 2016-08-20 DIAGNOSIS — S42202A Unspecified fracture of upper end of left humerus, initial encounter for closed fracture: Secondary | ICD-10-CM | POA: Diagnosis not present

## 2016-08-20 DIAGNOSIS — J449 Chronic obstructive pulmonary disease, unspecified: Secondary | ICD-10-CM | POA: Insufficient documentation

## 2016-08-20 DIAGNOSIS — Z7982 Long term (current) use of aspirin: Secondary | ICD-10-CM | POA: Diagnosis not present

## 2016-08-20 DIAGNOSIS — Z87891 Personal history of nicotine dependence: Secondary | ICD-10-CM | POA: Diagnosis not present

## 2016-08-20 DIAGNOSIS — Y939 Activity, unspecified: Secondary | ICD-10-CM | POA: Diagnosis not present

## 2016-08-20 DIAGNOSIS — S79922A Unspecified injury of left thigh, initial encounter: Secondary | ICD-10-CM | POA: Diagnosis not present

## 2016-08-20 DIAGNOSIS — I110 Hypertensive heart disease with heart failure: Secondary | ICD-10-CM | POA: Insufficient documentation

## 2016-08-20 DIAGNOSIS — S4992XA Unspecified injury of left shoulder and upper arm, initial encounter: Secondary | ICD-10-CM | POA: Diagnosis present

## 2016-08-20 DIAGNOSIS — Z7902 Long term (current) use of antithrombotics/antiplatelets: Secondary | ICD-10-CM | POA: Diagnosis not present

## 2016-08-20 DIAGNOSIS — Y92009 Unspecified place in unspecified non-institutional (private) residence as the place of occurrence of the external cause: Secondary | ICD-10-CM | POA: Insufficient documentation

## 2016-08-20 DIAGNOSIS — S0990XA Unspecified injury of head, initial encounter: Secondary | ICD-10-CM | POA: Diagnosis not present

## 2016-08-20 DIAGNOSIS — E119 Type 2 diabetes mellitus without complications: Secondary | ICD-10-CM | POA: Insufficient documentation

## 2016-08-20 DIAGNOSIS — I5042 Chronic combined systolic (congestive) and diastolic (congestive) heart failure: Secondary | ICD-10-CM | POA: Insufficient documentation

## 2016-08-20 DIAGNOSIS — W19XXXA Unspecified fall, initial encounter: Secondary | ICD-10-CM

## 2016-08-20 DIAGNOSIS — Z79899 Other long term (current) drug therapy: Secondary | ICD-10-CM | POA: Diagnosis not present

## 2016-08-20 DIAGNOSIS — E785 Hyperlipidemia, unspecified: Secondary | ICD-10-CM | POA: Diagnosis not present

## 2016-08-20 DIAGNOSIS — S42252A Displaced fracture of greater tuberosity of left humerus, initial encounter for closed fracture: Secondary | ICD-10-CM | POA: Diagnosis not present

## 2016-08-20 DIAGNOSIS — S299XXA Unspecified injury of thorax, initial encounter: Secondary | ICD-10-CM | POA: Diagnosis not present

## 2016-08-20 LAB — COMPREHENSIVE METABOLIC PANEL
ALBUMIN: 3.3 g/dL — AB (ref 3.5–5.0)
ALK PHOS: 60 U/L (ref 38–126)
ALT: 12 U/L — ABNORMAL LOW (ref 17–63)
ANION GAP: 8 (ref 5–15)
AST: 22 U/L (ref 15–41)
BILIRUBIN TOTAL: 0.8 mg/dL (ref 0.3–1.2)
BUN: 24 mg/dL — AB (ref 6–20)
CALCIUM: 8.9 mg/dL (ref 8.9–10.3)
CO2: 27 mmol/L (ref 22–32)
Chloride: 104 mmol/L (ref 101–111)
Creatinine, Ser: 1.62 mg/dL — ABNORMAL HIGH (ref 0.61–1.24)
GFR calc Af Amer: 47 mL/min — ABNORMAL LOW (ref >60)
GFR, EST NON AFRICAN AMERICAN: 40 mL/min — AB (ref >60)
GLUCOSE: 177 mg/dL — AB (ref 65–99)
POTASSIUM: 3.8 mmol/L (ref 3.5–5.1)
Sodium: 139 mmol/L (ref 135–145)
Total Protein: 6.3 g/dL — ABNORMAL LOW (ref 6.5–8.1)

## 2016-08-20 LAB — I-STAT CHEM 8, ED
BUN: 27 mg/dL — ABNORMAL HIGH (ref 6–20)
Calcium, Ion: 1.14 mmol/L — ABNORMAL LOW (ref 1.15–1.40)
Chloride: 102 mmol/L (ref 101–111)
Creatinine, Ser: 1.5 mg/dL — ABNORMAL HIGH (ref 0.61–1.24)
Glucose, Bld: 179 mg/dL — ABNORMAL HIGH (ref 65–99)
HCT: 33 % — ABNORMAL LOW (ref 39.0–52.0)
Hemoglobin: 11.2 g/dL — ABNORMAL LOW (ref 13.0–17.0)
Potassium: 3.8 mmol/L (ref 3.5–5.1)
Sodium: 140 mmol/L (ref 135–145)
TCO2: 24 mmol/L (ref 0–100)

## 2016-08-20 LAB — DIFFERENTIAL
Basophils Absolute: 0 10*3/uL (ref 0.0–0.1)
Basophils Relative: 0 %
EOS ABS: 0 10*3/uL (ref 0.0–0.7)
EOS PCT: 0 %
LYMPHS PCT: 8 %
Lymphs Abs: 0.8 10*3/uL (ref 0.7–4.0)
MONOS PCT: 12 %
Monocytes Absolute: 1.2 10*3/uL — ABNORMAL HIGH (ref 0.1–1.0)
Neutro Abs: 7.8 10*3/uL — ABNORMAL HIGH (ref 1.7–7.7)
Neutrophils Relative %: 80 %

## 2016-08-20 LAB — CBC
HCT: 35 % — ABNORMAL LOW (ref 39.0–52.0)
Hemoglobin: 11.6 g/dL — ABNORMAL LOW (ref 13.0–17.0)
MCH: 29.7 pg (ref 26.0–34.0)
MCHC: 33.1 g/dL (ref 30.0–36.0)
MCV: 89.7 fL (ref 78.0–100.0)
Platelets: 132 10*3/uL — ABNORMAL LOW (ref 150–400)
RBC: 3.9 MIL/uL — ABNORMAL LOW (ref 4.22–5.81)
RDW: 14.3 % (ref 11.5–15.5)
WBC: 9.9 10*3/uL (ref 4.0–10.5)

## 2016-08-20 LAB — I-STAT TROPONIN, ED: Troponin i, poc: 0.03 ng/mL (ref 0.00–0.08)

## 2016-08-20 LAB — PROTIME-INR
INR: 1.04
Prothrombin Time: 13.6 seconds (ref 11.4–15.2)

## 2016-08-20 LAB — APTT: aPTT: 25 seconds (ref 24–36)

## 2016-08-20 NOTE — Care Management Note (Signed)
Case Management Note  Patient Details  Name: Donald Richardson MRN: [id number] Date of Birth: 1940-08-22  Subjective/Objective:               Patient presented yo [facility] ED s/p fall let arm injury.    Action/Plan: ED CM met with patient at bedside to discuss recommendations for [facility] services. Patient reports living at home alone generally uses the assistance of a walker for ambulation. Patient unable to utilize left arm due ot injury. Discussed that he may benefit from a  manual w/c, patient agreeable. CM contacted [facility] after hour team, who states they will not be able to deliver to the hospital. But someone can pick it up at the store. CM make patient and family aware they are agreeable. CM provided patient with a copy of order for w/c as well to provide to [facility]  [facility] orders also faxed to [facility]. Patient verbalized understanding teach back done. No further questions or concerns voiced  Expected Discharge Date:    08/20/16              Expected Discharge Plan:  [place]  In-House Referral:     Discharge planning Services  CM Consult  Post Acute Care Choice:    Choice offered to:  Patient  DME Arranged:  Wheelchair manual DME Agency:     HH Arranged:  RN, PT, OT, Nurse's Aide, Social Work [employer] Agency:  [place]  Status of Service:  Completed, signed off  If discussed at [employer], dates discussed:    Additional CommentsLaurena Slimmer, RN 08/20/2016, 5:48 PM

## 2016-08-20 NOTE — ED Provider Notes (Addendum)
[facility] DEPT Provider Note   CSN: [id number] Arrival date & time: 08/20/16  1311     History   Chief Complaint Chief Complaint  Patient presents with  . Fall    HPI TREVAUN Richardson is a 76 y.o. male.  HPI Patient presents the emergency department after a fall 48 hours ago.  His had difficulty getting off the couch secondary to pain in his left leg and his left shoulder.  He reports the fall was tripping forward resulting in abrasions of both of his knees and landing on his left side.  He denies neck pain.  No headache or head injury.  No vomiting.  No altered mental status.  Patient was at home by himself.  Patient's brother drove up from the [place] today and brought him to the ER for evaluation.  He has obvious deformity and bruising of his left upper arm.  Denies fevers and chills.  No cough or congestion.  Denies shortness of breath.  Denies abdominal pain.  Reports some tenderness in the left chest.  He ambulates with a walker.   Past Medical History:  Diagnosis Date  . ASHD (arteriosclerotic heart disease)    STENT  . COPD (chronic obstructive pulmonary disease) ([place])   . Diabetes mellitus    Hgb A1C- 5.8 in May 2017  . Hemorrhoids   . Hyperlipidemia   . Hypertension   . Smoker     Patient Active Problem List   Diagnosis Date Noted  . 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 Stage 3 chronic kidney disease   . Diabetes mellitus type 2 in nonobese (HCC)   . Coronary artery disease involving native coronary artery of native heart without angina pectoris   . Small vessel disease, cerebrovascular 06/24/2016  . Left hemiparesis ([place])   . Thrombocytopenia ([place])   . Acute on chronic combined systolic and diastolic CHF (congestive heart failure) ([place])   . RBBB 06/21/2016  . CKD (chronic kidney disease), stage III 06/21/2016  . Basal ganglia infarction ([place]) 06/20/2016  . H/O cataract extraction 06/26/2014  . COLD (chronic obstructive lung disease) ([place]) 02/27/2014    . Essential hypertension 06/16/2012  . Diabetes mellitus type 2, controlled, with complications ([place]) 67/89/3810  . Hyperlipidemia associated with type 2 diabetes mellitus ([place]) 08/14/2008  . CAD, NATIVE VESSEL 08/14/2008  . Bradycardia 08/14/2008    Past Surgical History:  Procedure Laterality Date  . INGUINAL HERNIA REPAIR Left 10/26/2015   Procedure: LEFT INGUINAL HERNIA REPAIR WITH MESH;  Surgeon: Jackolyn Confer, MD;  Location: [facility];  Service: General;  Laterality: Left;  . INSERTION OF MESH Left 10/26/2015   Procedure: INSERTION OF MESH;  Surgeon: Jackolyn Confer, MD;  Location: [facility];  Service: General;  Laterality: Left;  . NO PAST SURGERIES         Home Medications    Prior to Admission medications   Medication Sig Start Date End Date Taking? Authorizing Provider  acetaminophen (TYLENOL) 500 MG tablet Take 1,000 mg by mouth every 6 (six) hours as needed for mild pain.   Yes [provider]  aspirin EC 325 MG EC tablet Take 1 tablet (325 mg total) by mouth daily. 06/24/16  Yes Caren Griffins, MD  clopidogrel (PLAVIX) 75 MG tablet Take 1 tablet (75 mg total) by mouth daily. 07/09/16  Yes Angiulli, Lavon Paganini, PA-C  rosuvastatin (CRESTOR) 20 MG tablet Take 1 tablet (20 mg total) by mouth at bedtime. 07/09/16  Yes Angiulli, Lavon Paganini, PA-C  Family History Family History  Problem Relation Age of Onset  . Breast cancer Mother   . Parkinson's disease Father     Social History Social History  Substance Use Topics  . Smoking status: Former Smoker    Packs/day: 1.00    Types: Cigarettes    Quit date: 06/20/2016  . Smokeless tobacco: Never Used  . Alcohol use No     Allergies   Patient has no known allergies.   Review of Systems Review of Systems  All other systems reviewed and are negative.    Physical Exam Updated Vital Signs BP 134/69   Pulse 70   Temp 97.4 F (36.3 C)   Resp 17   SpO2 94%   Physical Exam  Constitutional: He is oriented to  person, place, and time. He appears well-developed and well-nourished.  HENT:  Head: Normocephalic and atraumatic.  Eyes: EOM are normal.  Neck: Normal range of motion. Neck supple.  C-spine nontender  Cardiovascular: Normal rate, regular rhythm, normal heart sounds and intact distal pulses.   Pulmonary/Chest: Effort normal and breath sounds normal. No respiratory distress.  Abdominal: Soft. He exhibits no distension. There is no tenderness.  Musculoskeletal:  Full range of motion bilateral hips, knees, ankles.  Abrasions of bilateral patellas without bleeding or deformity.  No surrounding signs of infection.  Full range of motion bilateral wrists and bilateral elbows.  Full range of motion of right shoulder.  Limited range of motion of left shoulder secondary to pain.  Obvious swelling and bruising of his left proximal humeral region.  Neurological: He is alert and oriented to person, place, and time.  Skin: Skin is warm and dry.  Psychiatric: He has a normal mood and affect. Judgment normal.  Nursing note and vitals reviewed.    ED Treatments / Results  Labs (all labs ordered are listed, but only abnormal results are displayed) Labs Reviewed  CBC - Abnormal; Notable for the following:       Result Value   RBC 3.90 (*)    Hemoglobin 11.6 (*)    HCT 35.0 (*)    Platelets 132 (*)    All other components within normal limits  DIFFERENTIAL - Abnormal; Notable for the following:    Neutro Abs 7.8 (*)    Monocytes Absolute 1.2 (*)    All other components within normal limits  COMPREHENSIVE METABOLIC PANEL - Abnormal; Notable for the following:    Glucose, Bld 177 (*)    BUN 24 (*)    Creatinine, Ser 1.62 (*)    Total Protein 6.3 (*)    Albumin 3.3 (*)    ALT 12 (*)    GFR calc non Af Amer 40 (*)    GFR calc Af Amer 47 (*)    All other components within normal limits  I-STAT CHEM 8, ED - Abnormal; Notable for the following:    BUN 27 (*)    Creatinine, Ser 1.50 (*)    Glucose,  Bld 179 (*)    Calcium, Ion 1.14 (*)    Hemoglobin 11.2 (*)    HCT 33.0 (*)    All other components within normal limits  PROTIME-INR  APTT  I-STAT TROPOININ, ED  CBG MONITORING, ED   BUN  Date Value Ref Range Status  08/20/2016 27 (H) 6 - 20 mg/dL Final  08/20/2016 24 (H) 6 - 20 mg/dL Final  07/09/2016 29 (H) 6 - 20 mg/dL Final  07/07/2016 30 (H) 6 - 20 mg/dL Final  Creat  Date Value Ref Range Status  08/29/2015 1.47 (H) 0.70 - 1.18 mg/dL Final    Comment:      For patients > or = 75 years of age: The upper reference limit for Creatinine is approximately 13% higher for people identified as African-American.     07/27/2015 1.62 (H) 0.70 - 1.18 mg/dL Final  07/19/2015 1.82 (H) 0.70 - 1.18 mg/dL Final  06/26/2014 1.31 0.50 - 1.35 mg/dL Final   Creatinine, Ser  Date Value Ref Range Status  08/20/2016 1.50 (H) 0.61 - 1.24 mg/dL Final  08/20/2016 1.62 (H) 0.61 - 1.24 mg/dL Final  07/09/2016 1.33 (H) 0.61 - 1.24 mg/dL Final  07/08/2016 1.36 (H) 0.61 - 1.24 mg/dL Final     EKG  EKG Interpretation None       Radiology Dg Chest 2 View  Result Date: 08/20/2016 CLINICAL DATA:  Fall 2 days ago. EXAM: CHEST  2 VIEW COMPARISON:  Chest x-ray earlier today and 06/21/2016 FINDINGS: Cardiomegaly. No confluent opacity on the right. There is predominantly linear opacity at the left base, likely atelectasis. No effusions. No acute bony abnormality. No visible rib fracture. No pneumothorax. IMPRESSION: Cardiomegaly.  Left base atelectasis. Electronically Signed   By: Rolm Baptise M.D.   On: 08/20/2016 18:17   Ct Head Wo Contrast  Result Date: 08/20/2016 CLINICAL DATA:  Fall, head into area on blood thinners. EXAM: CT HEAD WITHOUT CONTRAST TECHNIQUE: Contiguous axial images were obtained from the base of the skull through the vertex without intravenous contrast. COMPARISON:  MRI and CT 06/20/2016 FINDINGS: Brain: Old lacunar infarcts in the basal ganglia bilaterally. There is atrophy  and chronic small vessel disease changes. No acute intracranial abnormality. Specifically, no hemorrhage, hydrocephalus, mass lesion, acute infarction, or significant intracranial injury. Vascular: No hyperdense vessel or unexpected calcification. Skull: No acute calvarial abnormality. Sinuses/Orbits: Areas of rounded mucosal thickening in the maxillary sinuses bilaterally. No air-fluid levels. Mastoid air cells are clear. Other: None IMPRESSION: Old bilateral basal ganglia lacunar infarcts. Atrophy, chronic small vessel disease. No acute intracranial abnormality. Electronically Signed   By: Rolm Baptise M.D.   On: 08/20/2016 14:58   Dg Chest Portable 1 View  Result Date: 08/20/2016 CLINICAL DATA:  Left-sided chest pain. History of coronary artery disease, COPD, chronic renal insufficiency stage III, discontinued smoking 2 months ago. EXAM: PORTABLE CHEST 1 VIEW COMPARISON:  PA and lateral chest x-ray of June 21, 2016 FINDINGS: The lungs are well-expanded. There is hazy increased density just above both hemidiaphragms which is more conspicuous than in the past. There is a trace of blunting of the left lateral costophrenic angle. The cardiac silhouette is enlarged but stable. The pulmonary vascularity is not engorged. There is no pleural effusion or pneumothorax. There is stable tortuosity of the descending thoracic aorta. The observed bony thorax exhibits no acute abnormality. IMPRESSION: Chronic bronchitic changes. Possible bibasilar atelectasis or early pneumonia. When the patient can tolerate the procedure, a PA and lateral chest x-ray would be useful. Electronically Signed   By: [name]  Martinique M.D.   On: 08/20/2016 17:14   Dg Humerus Left  Result Date: 08/20/2016 CLINICAL DATA:  Fall 3 days ago EXAM: LEFT HUMERUS - 2+ VIEW COMPARISON:  None. FINDINGS: Avulsion fracture the greater trochanter of the humerus. No dislocation. Fracture appears to extend across the humeral neck without displacement. AC joint  intact. Small left pleural effusion with left lower lobe atelectasis IMPRESSION: Avulsion fracture the greater tuberosity of the humerus. Probable nondisplaced transverse fracture of  the left humeral neck. Electronically Signed   By: Franchot Gallo M.D.   On: 08/20/2016 16:10   Dg Femur Min 2 Views Left  Result Date: 08/20/2016 CLINICAL DATA:  Fall 3 days ago EXAM: LEFT FEMUR 2 VIEWS COMPARISON:  None. FINDINGS: There is no evidence of fracture or other focal bone lesions. Soft tissues are unremarkable. IMPRESSION: Negative. Electronically Signed   By: Franchot Gallo M.D.   On: 08/20/2016 16:09    Procedures Procedures (including critical care time)  +++++++++++++++++++++++++++++++++++++++++  SPLINT APPLICATION Authorized by: Hoy Morn Consent: Verbal consent obtained. Risks and benefits: risks, benefits and alternatives were discussed Consent given by: patient Splint applied by: orthopedic technician Location details: left UE Splint type: sling Supplies used: sling Post-procedure: The splinted body part was neurovascularly unchanged following the procedure. Patient tolerance: Patient tolerated the procedure well with no immediate complications.    +++++++++++++++++++++++++++++++++++++++++++++  Medications Ordered in ED Medications - No data to display   Initial Impression / Assessment and Plan / ED Course  I have reviewed the triage vital signs and the nursing notes.  Pertinent labs & imaging results that were available during my care of the patient were reviewed by me and considered in my medical decision making (see chart for details).   baseline renal insufficiency.  Proximal left humerus fracture.  Sling placed for treatment of the humeral fracture.  Orthopedic follow-up.  Case management involved to obtain home health resources in wheelchair.  Patient is with his brother and his brother will take him home at this time.  He understands to be nonweightbearing left upper  extremity.  Final Clinical Impressions(s) / ED Diagnoses   Final diagnoses:  Closed fracture of proximal end of left humerus, unspecified fracture morphology, initial encounter  Fall, initial encounter    New Prescriptions New Prescriptions   No medications on file     Jola Schmidt, MD 08/20/16 [place], Bora Broner, MD 08/20/16 [id number]

## 2016-08-20 NOTE — Telephone Encounter (Signed)
If there has been acute change such as new paralysis, weakness, slurred speech, confusion, etc, then go to the ED

## 2016-08-20 NOTE — Telephone Encounter (Signed)
Spoke to pt's brother, Synetta Fail was going to take pt to the ER as soon as he could get pt dressed

## 2016-08-20 NOTE — Care Management (Signed)
    Durable Medical Equipment        Start     Ordered   08/20/16 0000  For home use only DME wheelchair cushion (seat and back)     08/20/16 1735

## 2016-08-20 NOTE — ED Notes (Signed)
Pt takes Plavix

## 2016-08-20 NOTE — ED Triage Notes (Signed)
Pt reports he fell on Monday and since then he had pain to left arm and shoulder. He has abrasions to his knees and left side of his face. He is A&Ox4, speaking clear complete sentences. He states his whole body hurts. Denies LOC.

## 2016-08-20 NOTE — ED Notes (Signed)
Patient transported to X-ray 

## 2016-08-20 NOTE — Telephone Encounter (Addendum)
Rosann Auerbach, occupational therapist at [facility], called stating that she had called yesterday to report that pt had a fall on 08/18/16 at a doughnut shop and he is in bad shape with scrapes, scars and bruises. Need to be examined by a doctor to make sure he does not have broken bones as well. Rosann Auerbach requesting that we reach out to pt to make an appointment. Spoke to pt's brother, [name], who just arrived in the home with the pt and he said that has lost the use of one of his arms and he's had a stroke in the past so brother is concerned if the arm may be broken or if pt had had a stroke. Should we bring him in today?

## 2016-08-20 NOTE — ED Notes (Signed)
Pt ands brother state they understand instructions.

## 2016-08-21 ENCOUNTER — Telehealth: Payer: Self-pay | Admitting: Family Medicine

## 2016-08-21 ENCOUNTER — Emergency Department (HOSPITAL_COMMUNITY)
Admission: EM | Admit: 2016-08-21 | Discharge: 2016-08-22 | Disposition: A | Discharge status: Home / Self Care | Payer: Medicare Other | Attending: Emergency Medicine | Admitting: Emergency Medicine

## 2016-08-21 ENCOUNTER — Encounter (HOSPITAL_COMMUNITY): Payer: Self-pay | Admitting: Emergency Medicine

## 2016-08-21 DIAGNOSIS — I2510 Atherosclerotic heart disease of native coronary artery without angina pectoris: Secondary | ICD-10-CM | POA: Diagnosis not present

## 2016-08-21 DIAGNOSIS — I129 Hypertensive chronic kidney disease with stage 1 through stage 4 chronic kidney disease, or unspecified chronic kidney disease: Secondary | ICD-10-CM | POA: Diagnosis not present

## 2016-08-21 DIAGNOSIS — Z7902 Long term (current) use of antithrombotics/antiplatelets: Secondary | ICD-10-CM | POA: Diagnosis not present

## 2016-08-21 DIAGNOSIS — T148XXA Other injury of unspecified body region, initial encounter: Secondary | ICD-10-CM | POA: Diagnosis not present

## 2016-08-21 DIAGNOSIS — W19XXXA Unspecified fall, initial encounter: Secondary | ICD-10-CM | POA: Insufficient documentation

## 2016-08-21 DIAGNOSIS — I130 Hypertensive heart and chronic kidney disease with heart failure and stage 1 through stage 4 chronic kidney disease, or unspecified chronic kidney disease: Secondary | ICD-10-CM | POA: Diagnosis not present

## 2016-08-21 DIAGNOSIS — Z9181 History of falling: Secondary | ICD-10-CM | POA: Insufficient documentation

## 2016-08-21 DIAGNOSIS — E119 Type 2 diabetes mellitus without complications: Secondary | ICD-10-CM | POA: Diagnosis not present

## 2016-08-21 DIAGNOSIS — Z79899 Other long term (current) drug therapy: Secondary | ICD-10-CM | POA: Diagnosis not present

## 2016-08-21 DIAGNOSIS — J449 Chronic obstructive pulmonary disease, unspecified: Secondary | ICD-10-CM | POA: Insufficient documentation

## 2016-08-21 DIAGNOSIS — N183 Chronic kidney disease, stage 3 (moderate): Secondary | ICD-10-CM | POA: Diagnosis not present

## 2016-08-21 DIAGNOSIS — I69354 Hemiplegia and hemiparesis following cerebral infarction affecting left non-dominant side: Secondary | ICD-10-CM | POA: Diagnosis not present

## 2016-08-21 DIAGNOSIS — Z7982 Long term (current) use of aspirin: Secondary | ICD-10-CM | POA: Insufficient documentation

## 2016-08-21 DIAGNOSIS — E1122 Type 2 diabetes mellitus with diabetic chronic kidney disease: Secondary | ICD-10-CM | POA: Diagnosis not present

## 2016-08-21 DIAGNOSIS — S4992XA Unspecified injury of left shoulder and upper arm, initial encounter: Secondary | ICD-10-CM | POA: Diagnosis not present

## 2016-08-21 DIAGNOSIS — Z87891 Personal history of nicotine dependence: Secondary | ICD-10-CM | POA: Insufficient documentation

## 2016-08-21 DIAGNOSIS — S4992XD Unspecified injury of left shoulder and upper arm, subsequent encounter: Secondary | ICD-10-CM | POA: Diagnosis not present

## 2016-08-21 DIAGNOSIS — I5043 Acute on chronic combined systolic (congestive) and diastolic (congestive) heart failure: Secondary | ICD-10-CM | POA: Diagnosis not present

## 2016-08-21 DIAGNOSIS — M79606 Pain in leg, unspecified: Secondary | ICD-10-CM | POA: Diagnosis not present

## 2016-08-21 DIAGNOSIS — M79603 Pain in arm, unspecified: Secondary | ICD-10-CM | POA: Diagnosis not present

## 2016-08-21 MED ORDER — ASPIRIN EC 325 MG PO TBEC
325.0000 mg | DELAYED_RELEASE_TABLET | Freq: Every day | ORAL | Status: DC
  Administered 2016-08-21: 325 mg via ORAL
  Filled 2016-08-21: qty 1

## 2016-08-21 MED ORDER — CLOPIDOGREL BISULFATE 75 MG PO TABS
75.0000 mg | ORAL_TABLET | Freq: Every day | ORAL | Status: DC
  Administered 2016-08-21: 75 mg via ORAL
  Filled 2016-08-21: qty 1

## 2016-08-21 MED ORDER — OXYCODONE-ACETAMINOPHEN 5-325 MG PO TABS
1.0000 | ORAL_TABLET | ORAL | Status: DC | PRN
  Administered 2016-08-21: 1 via ORAL
  Filled 2016-08-21: qty 1

## 2016-08-21 MED ORDER — ROSUVASTATIN CALCIUM 10 MG PO TABS
20.0000 mg | ORAL_TABLET | Freq: Every day | ORAL | Status: DC
  Administered 2016-08-21: 20 mg via ORAL
  Filled 2016-08-21: qty 2

## 2016-08-21 NOTE — ED Triage Notes (Signed)
Per EMS, pt comes from home-lives alone, with a follow up for rehab after discharge from here yesterday. Pt home with home health nurse. Pt was seen here yesterday for a fall. Pt reports left arm is broken and legs ache. Left arm in sling. Pt A&OX4, NAD noted.

## 2016-08-21 NOTE — ED Notes (Signed)
Pt does not wish to have pain Meds at this time.

## 2016-08-21 NOTE — Progress Notes (Signed)
CSW spoke to pt re: his disposition.  Pt recently d/c'ed from ED 6/20 with [facility].  Pt tells CSW that he would like to go to CIR.  Explained that pt's are not admitted to CIR from the ED.  CSW further explained that, because he had traditional Medicare, a 3 midnight stay as an IP would be required before Medicare would pay for rehab at a SNF.  CSW offerd to find pt a private pay SNF bed, but he declined, stating that he didn't want to pay for his care.  CSW also discussed private duty care and list of providers given to pt.  SNF list also provided for future reference.  Pt to stay in ED this pm until his brother arrives in the am, per Dr. Venora Maples.  Dayshift ED CSW provided with update.

## 2016-08-21 NOTE — ED Provider Notes (Signed)
[facility] DEPT Provider Note   CSN: [id number] Arrival date & time: 08/21/16  1359   History   Chief Complaint Chief Complaint  Patient presents with  . Fall    frequent falls  . Arm Injury    from yesterday was seen here   HPI Donald Richardson is a 76 y.o. male.  Patient seen in ED 6/20 by Dr Venora Maples and noted to have an avulsion fracture the greater tuberosity of the humerus. Probable nondisplaced transverse fracture of the left humeral neck.  He was discharged with a sling a close follow up with orthopedics.  He has a Higher education careers adviser from [facility].  He was sent to ED by their recommendation today.  Patient reports that he was sent via EMS here because the RN at home said he needed to go back to rehab.  He reports that his pain is well controlled as long as he is not moving the LUE.  He has not seen orthopedics yet.  He does report that he lives alone.  He has had some difficulty walking after his fall on Monday, citing that everything hurts if he tries to walk.  He reports he was almost transitioned from the walker to independent ambulation when he started walking down hill on Monday towards his car and went down the hill to fast and ended up tripping and falling.  He denies dizziness, CP, SOB, nausea, vomiting, numbness, swelling.  He reports stable left sided weakness after his recent CVA, for which he was initially in rehab.     Past Medical History:  Diagnosis Date  . ASHD (arteriosclerotic heart disease)    STENT  . COPD (chronic obstructive pulmonary disease) ([place])   . Diabetes mellitus    Hgb A1C- 5.8 in May 2017  . Hemorrhoids   . Hyperlipidemia   . Hypertension   . Smoker     Patient Active Problem List   Diagnosis Date Noted  . 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 Stage 3 chronic kidney disease   . Diabetes mellitus type 2 in nonobese (HCC)   . Coronary artery disease involving native coronary artery of native heart without angina pectoris   . Small  vessel disease, cerebrovascular 06/24/2016  . Left hemiparesis ([place])   . Thrombocytopenia ([place])   . Acute on chronic combined systolic and diastolic CHF (congestive heart failure) ([place])   . RBBB 06/21/2016  . CKD (chronic kidney disease), stage III 06/21/2016  . Basal ganglia infarction ([place]) 06/20/2016  . H/O cataract extraction 06/26/2014  . COLD (chronic obstructive lung disease) ([place]) 02/27/2014  . Essential hypertension 06/16/2012  . Diabetes mellitus type 2, controlled, with complications ([place]) 32/35/5732  . Hyperlipidemia associated with type 2 diabetes mellitus ([place]) 08/14/2008  . CAD, NATIVE VESSEL 08/14/2008  . Bradycardia 08/14/2008    Past Surgical History:  Procedure Laterality Date  . INGUINAL HERNIA REPAIR Left 10/26/2015   Procedure: LEFT INGUINAL HERNIA REPAIR WITH MESH;  Surgeon: Jackolyn Confer, MD;  Location: [facility];  Service: General;  Laterality: Left;  . INSERTION OF MESH Left 10/26/2015   Procedure: INSERTION OF MESH;  Surgeon: Jackolyn Confer, MD;  Location: [facility];  Service: General;  Laterality: Left;  . NO PAST SURGERIES       Home Medications    Prior to Admission medications   Medication Sig Start Date End Date Taking? Authorizing Provider  acetaminophen (TYLENOL) 500 MG tablet Take 1,000 mg by mouth every 6 (six) hours as needed for mild  pain.    [provider]  aspirin EC 325 MG EC tablet Take 1 tablet (325 mg total) by mouth daily. 06/24/16   Caren Griffins, MD  clopidogrel (PLAVIX) 75 MG tablet Take 1 tablet (75 mg total) by mouth daily. 07/09/16   Angiulli, Lavon Paganini, PA-C  rosuvastatin (CRESTOR) 20 MG tablet Take 1 tablet (20 mg total) by mouth at bedtime. 07/09/16   Angiulli, Lavon Paganini, PA-C    Family History Family History  Problem Relation Age of Onset  . Breast cancer Mother   . Parkinson's disease Father     Social History Social History  Substance Use Topics  . Smoking status: Former Smoker    Packs/day: 1.00    Types:  Cigarettes    Quit date: 06/20/2016  . Smokeless tobacco: Never Used  . Alcohol use No     Allergies   Patient has no known allergies.   Review of Systems Review of Systems  Constitutional: Positive for activity change.  Eyes: Positive for discharge (since cataract surgery in 2017). Negative for photophobia, pain, redness and visual disturbance.  Respiratory: Negative for chest tightness and shortness of breath.   Cardiovascular: Negative for chest pain.  Gastrointestinal: Negative for nausea and vomiting.  Musculoskeletal: Positive for arthralgias, gait problem and myalgias.  Neurological: Positive for weakness (left sided after CVA). Negative for speech difficulty and numbness.    Physical Exam Updated Vital Signs BP 99/63 (BP Location: Right Arm)   Pulse 69   Temp 98.1 F (36.7 C) (Oral)   Resp 20   Ht 5\' 8"  (1.727 m)   Wt 66.7 kg (147 lb)   SpO2 92%   BMI 22.35 kg/m   Physical Exam  Constitutional: He is oriented to person, place, and time. He appears well-developed. No distress.  Thin elderly male  HENT:  Head: Normocephalic.  Nose: Nose normal.  Mouth/Throat: No oropharyngeal exudate.  Has healing excoriation along left side of temple   Eyes: EOM are normal. Right eye exhibits no discharge. Left eye exhibits discharge.    Neck: Normal range of motion. Neck supple.  Cardiovascular: Regular rhythm and normal heart sounds.  Bradycardia present.   Pulmonary/Chest: Effort normal and breath sounds normal. He has no wheezes.  Abdominal: Soft. There is no tenderness.  Musculoskeletal:       Left shoulder: He exhibits decreased range of motion, tenderness, bony tenderness and pain.       Left hand: He exhibits normal capillary refill and no deformity. Normal sensation noted.  Healing abrasions along bilateral shins.  Left anteriolateral thigh with mild ecchymosis. Bilateral knees with healing abrasions.    Neurological: He is alert and oriented to person, place, and  time.  Skin: Capillary refill takes less than 2 seconds. Abrasion and bruising noted. He is not diaphoretic.  Multiple healing abrasions.  No surrounding erythema or purulence.  All abrasions are hemostatic.  Psychiatric: He has a normal mood and affect. His behavior is normal. Judgment and thought content normal.  Nursing note and vitals reviewed.   ED Treatments / Results  Labs (all labs ordered are listed, but only abnormal results are displayed) Labs Reviewed - No data to display  EKG  EKG Interpretation None       Radiology Dg Chest 2 View  Result Date: 08/20/2016 CLINICAL DATA:  Fall 2 days ago. EXAM: CHEST  2 VIEW COMPARISON:  Chest x-ray earlier today and 06/21/2016 FINDINGS: Cardiomegaly. No confluent opacity on the right. There is predominantly linear  opacity at the left base, likely atelectasis. No effusions. No acute bony abnormality. No visible rib fracture. No pneumothorax. IMPRESSION: Cardiomegaly.  Left base atelectasis. Electronically Signed   By: Rolm Baptise M.D.   On: 08/20/2016 18:17   Ct Head Wo Contrast  Result Date: 08/20/2016 CLINICAL DATA:  Fall, head into area on blood thinners. EXAM: CT HEAD WITHOUT CONTRAST TECHNIQUE: Contiguous axial images were obtained from the base of the skull through the vertex without intravenous contrast. COMPARISON:  MRI and CT 06/20/2016 FINDINGS: Brain: Old lacunar infarcts in the basal ganglia bilaterally. There is atrophy and chronic small vessel disease changes. No acute intracranial abnormality. Specifically, no hemorrhage, hydrocephalus, mass lesion, acute infarction, or significant intracranial injury. Vascular: No hyperdense vessel or unexpected calcification. Skull: No acute calvarial abnormality. Sinuses/Orbits: Areas of rounded mucosal thickening in the maxillary sinuses bilaterally. No air-fluid levels. Mastoid air cells are clear. Other: None IMPRESSION: Old bilateral basal ganglia lacunar infarcts. Atrophy, chronic small  vessel disease. No acute intracranial abnormality. Electronically Signed   By: Rolm Baptise M.D.   On: 08/20/2016 14:58   Dg Chest Portable 1 View  Result Date: 08/20/2016 CLINICAL DATA:  Left-sided chest pain. History of coronary artery disease, COPD, chronic renal insufficiency stage III, discontinued smoking 2 months ago. EXAM: PORTABLE CHEST 1 VIEW COMPARISON:  PA and lateral chest x-ray of June 21, 2016 FINDINGS: The lungs are well-expanded. There is hazy increased density just above both hemidiaphragms which is more conspicuous than in the past. There is a trace of blunting of the left lateral costophrenic angle. The cardiac silhouette is enlarged but stable. The pulmonary vascularity is not engorged. There is no pleural effusion or pneumothorax. There is stable tortuosity of the descending thoracic aorta. The observed bony thorax exhibits no acute abnormality. IMPRESSION: Chronic bronchitic changes. Possible bibasilar atelectasis or early pneumonia. When the patient can tolerate the procedure, a PA and lateral chest x-ray would be useful. Electronically Signed   By: [name]  Martinique M.D.   On: 08/20/2016 17:14   Dg Humerus Left  Result Date: 08/20/2016 CLINICAL DATA:  Fall 3 days ago EXAM: LEFT HUMERUS - 2+ VIEW COMPARISON:  None. FINDINGS: Avulsion fracture the greater trochanter of the humerus. No dislocation. Fracture appears to extend across the humeral neck without displacement. AC joint intact. Small left pleural effusion with left lower lobe atelectasis IMPRESSION: Avulsion fracture the greater tuberosity of the humerus. Probable nondisplaced transverse fracture of the left humeral neck. Electronically Signed   By: Franchot Gallo M.D.   On: 08/20/2016 16:10   Dg Femur Min 2 Views Left  Result Date: 08/20/2016 CLINICAL DATA:  Fall 3 days ago EXAM: LEFT FEMUR 2 VIEWS COMPARISON:  None. FINDINGS: There is no evidence of fracture or other focal bone lesions. Soft tissues are unremarkable.  IMPRESSION: Negative. Electronically Signed   By: Franchot Gallo M.D.   On: 08/20/2016 16:09    Procedures Procedures (including critical care time)  Medications Ordered in ED Medications - No data to display   Initial Impression / Assessment and Plan / ED Course  I have reviewed the triage vital signs and the nursing notes.  Pertinent labs & imaging results that were available during my care of the patient were reviewed by me and considered in my medical decision making (see chart for details).    1447: Discussed care with CM/ CSW to see if we can get patient into rehab facility.  There is a concern of patient safety at home, as  he resides along and has limited mobility 2/2 recent fall/ CVA.  1546: Spoke to [place], CSW, who is reviewing patient's chart.  She will see patient to discuss options shortly.  1601: Updated patient on CSW, who will see.  He reports he is feeling fine.  No concerns.  Appreciates update.  Final Clinical Impressions(s) / ED Diagnoses   Final diagnoses:  History of recent fall  Injury of left upper extremity, subsequent encounter   Donald Richardson is a 76 y.o. male that presented to ED via EMS for concern for safety at home in setting recent fall after being discharged from rehab after CVA in May.  He is to be assessed by CSW for possible placement.  His care was transferred to oncoming EDP, Dr Venora Maples.  New Prescriptions New Prescriptions   No medications on file     Janora Norlander, DO 08/21/16 [place], Kevin, MD 08/21/16 2051    Jola Schmidt, MD 08/21/16 2053

## 2016-08-21 NOTE — Telephone Encounter (Signed)
Nurse from, [facility], called while she was in the home with the patient and she was recommending that pt get admitted to the hospital due to his condition. He has a fracture, not able to care for himself, home alone, in bad shape. Dr Redmond School agreed with this recommendation.

## 2016-08-21 NOTE — Discharge Instructions (Signed)
I recommend follow up with your [facility].  They can arrange resources for home safety.

## 2016-08-21 NOTE — Telephone Encounter (Signed)
Answering service just called and stated [name] with [facility] [phone] called and advised patient needs to go to the hospital.

## 2016-08-21 NOTE — ED Notes (Signed)
Dinner tray ordered; regular diet 

## 2016-08-21 NOTE — Telephone Encounter (Signed)
Rosann Auerbach, occupational therapist with [facility], called to f/u on pt and make sure that pt was seen yesterday. Advised her that pt's brother came in town and he felt that it would take a while to get the pt ready to get out of the house and wanted a later day appointment so our provider decided it would be best to send the pt to the ER where he could get xrays and tests that he may need. The therapist said she has seen that now and the nurse is planning to go out and visit the pt today because they are concerned because pt's brother has left him home alone again and brother has gone back out of town. Rosann Auerbach said there has been an instance that she found the pt naked when she arrived to his home, trashed is always all over the house, he does not have cooking skills, he eats frozen or microwaveable foods. They are worried that his living conditions will get worse now that he has fractures as reported by the nurse from [facility] who will be going out to see him & making recommendations.

## 2016-08-21 NOTE — ED Notes (Signed)
ED Provider at bedside. 

## 2016-08-22 ENCOUNTER — Telehealth: Payer: Self-pay | Admitting: Family Medicine

## 2016-08-22 ENCOUNTER — Encounter: Payer: Self-pay | Admitting: *Deleted

## 2016-08-22 DIAGNOSIS — I2510 Atherosclerotic heart disease of native coronary artery without angina pectoris: Secondary | ICD-10-CM | POA: Diagnosis not present

## 2016-08-22 DIAGNOSIS — I69354 Hemiplegia and hemiparesis following cerebral infarction affecting left non-dominant side: Secondary | ICD-10-CM | POA: Diagnosis not present

## 2016-08-22 DIAGNOSIS — E1122 Type 2 diabetes mellitus with diabetic chronic kidney disease: Secondary | ICD-10-CM | POA: Diagnosis not present

## 2016-08-22 DIAGNOSIS — N183 Chronic kidney disease, stage 3 (moderate): Secondary | ICD-10-CM | POA: Diagnosis not present

## 2016-08-22 DIAGNOSIS — I5043 Acute on chronic combined systolic (congestive) and diastolic (congestive) heart failure: Secondary | ICD-10-CM | POA: Diagnosis not present

## 2016-08-22 DIAGNOSIS — I130 Hypertensive heart and chronic kidney disease with heart failure and stage 1 through stage 4 chronic kidney disease, or unspecified chronic kidney disease: Secondary | ICD-10-CM | POA: Diagnosis not present

## 2016-08-22 NOTE — Telephone Encounter (Signed)
Left VM on Donald Richardson that it was okay per [facility]

## 2016-08-22 NOTE — Telephone Encounter (Signed)
ok 

## 2016-08-22 NOTE — Discharge Planning (Signed)
Pt currently active with [facility] for RN/PT/OT/NA SW services.  Resumption of care requested. Edwinna Areola of [facility] notified.  No DME needs identified at this time.

## 2016-08-22 NOTE — Telephone Encounter (Signed)
Spoke to ER Nurse who advised that pt is expected to be released as soon as pt's family arrive and they were told family would arrive around mid morning. George Ina, RN at [facility]. She plans to go out to do a home assessment today and  request PT, OT, skilled nursing, home health services. [name] plans to have a Education officer, museum assess pt as well. Jenny Reichmann spoke to pt's brother & son and hope to have family support on the weekends and other times when [facility] is not able to provide care to the patient.

## 2016-08-22 NOTE — ED Notes (Signed)
Brother at bedside. Wheelchair has been delivered to room from [facility]. No acute distress noted. Brother will be transporting patient at time of discharge.

## 2016-08-22 NOTE — ED Notes (Signed)
Patient was given a cup of coke. 

## 2016-08-22 NOTE — Progress Notes (Signed)
EDCM consulted to assist pt with obtaining DME wheelchair that was ordered on last visit.  EDCM contacted [facility] rep Edwinna Areola) to find that she delivered wheelchair to pt prior to discharge home.

## 2016-08-22 NOTE — Telephone Encounter (Signed)
Danton Clap Ward with [facility] [phone] ext [phone]  Wants order to have social worker go out by Monday  Verbal order for home social worker

## 2016-08-25 DIAGNOSIS — I5043 Acute on chronic combined systolic (congestive) and diastolic (congestive) heart failure: Secondary | ICD-10-CM | POA: Diagnosis not present

## 2016-08-25 DIAGNOSIS — I69354 Hemiplegia and hemiparesis following cerebral infarction affecting left non-dominant side: Secondary | ICD-10-CM | POA: Diagnosis not present

## 2016-08-25 DIAGNOSIS — I2510 Atherosclerotic heart disease of native coronary artery without angina pectoris: Secondary | ICD-10-CM | POA: Diagnosis not present

## 2016-08-25 DIAGNOSIS — N183 Chronic kidney disease, stage 3 (moderate): Secondary | ICD-10-CM | POA: Diagnosis not present

## 2016-08-25 DIAGNOSIS — E1122 Type 2 diabetes mellitus with diabetic chronic kidney disease: Secondary | ICD-10-CM | POA: Diagnosis not present

## 2016-08-25 DIAGNOSIS — I130 Hypertensive heart and chronic kidney disease with heart failure and stage 1 through stage 4 chronic kidney disease, or unspecified chronic kidney disease: Secondary | ICD-10-CM | POA: Diagnosis not present

## 2016-08-25 NOTE — Care Management (Signed)
    Durable Medical Equipment        Start     Ordered   08/20/16 0000  For home use only DME wheelchair cushion (seat and back)     08/20/16 1735   08/20/16 0000  For home use only DME standard manual wheelchair with seat cushion    Comments:  Patient suffers from left humerus fracture which impairs their ability to perform daily activities like bathing, dressing, grooming and toileting in the home.  A walker will not resolve  issue with performing activities of daily living. A wheelchair will allow patient to safely perform daily activities. Patient can safely propel the wheelchair in the home or has a caregiver who can provide assistance.  Accessories: elevating leg rests (ELRs), wheel locks, extensions and anti-tippers.   08/20/16 1756

## 2016-08-27 DIAGNOSIS — I2510 Atherosclerotic heart disease of native coronary artery without angina pectoris: Secondary | ICD-10-CM | POA: Diagnosis not present

## 2016-08-27 DIAGNOSIS — N183 Chronic kidney disease, stage 3 (moderate): Secondary | ICD-10-CM | POA: Diagnosis not present

## 2016-08-27 DIAGNOSIS — I130 Hypertensive heart and chronic kidney disease with heart failure and stage 1 through stage 4 chronic kidney disease, or unspecified chronic kidney disease: Secondary | ICD-10-CM | POA: Diagnosis not present

## 2016-08-27 DIAGNOSIS — E1122 Type 2 diabetes mellitus with diabetic chronic kidney disease: Secondary | ICD-10-CM | POA: Diagnosis not present

## 2016-08-27 DIAGNOSIS — I69354 Hemiplegia and hemiparesis following cerebral infarction affecting left non-dominant side: Secondary | ICD-10-CM | POA: Diagnosis not present

## 2016-08-27 DIAGNOSIS — I5043 Acute on chronic combined systolic (congestive) and diastolic (congestive) heart failure: Secondary | ICD-10-CM | POA: Diagnosis not present

## 2016-08-28 DIAGNOSIS — E1122 Type 2 diabetes mellitus with diabetic chronic kidney disease: Secondary | ICD-10-CM | POA: Diagnosis not present

## 2016-08-28 DIAGNOSIS — I2510 Atherosclerotic heart disease of native coronary artery without angina pectoris: Secondary | ICD-10-CM | POA: Diagnosis not present

## 2016-08-28 DIAGNOSIS — N183 Chronic kidney disease, stage 3 (moderate): Secondary | ICD-10-CM | POA: Diagnosis not present

## 2016-08-28 DIAGNOSIS — I69354 Hemiplegia and hemiparesis following cerebral infarction affecting left non-dominant side: Secondary | ICD-10-CM | POA: Diagnosis not present

## 2016-08-28 DIAGNOSIS — I130 Hypertensive heart and chronic kidney disease with heart failure and stage 1 through stage 4 chronic kidney disease, or unspecified chronic kidney disease: Secondary | ICD-10-CM | POA: Diagnosis not present

## 2016-08-28 DIAGNOSIS — I5043 Acute on chronic combined systolic (congestive) and diastolic (congestive) heart failure: Secondary | ICD-10-CM | POA: Diagnosis not present

## 2016-08-29 DIAGNOSIS — E1122 Type 2 diabetes mellitus with diabetic chronic kidney disease: Secondary | ICD-10-CM | POA: Diagnosis not present

## 2016-08-29 DIAGNOSIS — N183 Chronic kidney disease, stage 3 (moderate): Secondary | ICD-10-CM | POA: Diagnosis not present

## 2016-08-29 DIAGNOSIS — I2510 Atherosclerotic heart disease of native coronary artery without angina pectoris: Secondary | ICD-10-CM | POA: Diagnosis not present

## 2016-08-29 DIAGNOSIS — I69354 Hemiplegia and hemiparesis following cerebral infarction affecting left non-dominant side: Secondary | ICD-10-CM | POA: Diagnosis not present

## 2016-08-29 DIAGNOSIS — S42295A Other nondisplaced fracture of upper end of left humerus, initial encounter for closed fracture: Secondary | ICD-10-CM | POA: Diagnosis not present

## 2016-08-29 DIAGNOSIS — I5043 Acute on chronic combined systolic (congestive) and diastolic (congestive) heart failure: Secondary | ICD-10-CM | POA: Diagnosis not present

## 2016-08-29 DIAGNOSIS — I130 Hypertensive heart and chronic kidney disease with heart failure and stage 1 through stage 4 chronic kidney disease, or unspecified chronic kidney disease: Secondary | ICD-10-CM | POA: Diagnosis not present

## 2016-09-01 ENCOUNTER — Telehealth: Payer: Self-pay

## 2016-09-01 DIAGNOSIS — I2510 Atherosclerotic heart disease of native coronary artery without angina pectoris: Secondary | ICD-10-CM | POA: Diagnosis not present

## 2016-09-01 DIAGNOSIS — E1122 Type 2 diabetes mellitus with diabetic chronic kidney disease: Secondary | ICD-10-CM | POA: Diagnosis not present

## 2016-09-01 DIAGNOSIS — I130 Hypertensive heart and chronic kidney disease with heart failure and stage 1 through stage 4 chronic kidney disease, or unspecified chronic kidney disease: Secondary | ICD-10-CM | POA: Diagnosis not present

## 2016-09-01 DIAGNOSIS — I69354 Hemiplegia and hemiparesis following cerebral infarction affecting left non-dominant side: Secondary | ICD-10-CM | POA: Diagnosis not present

## 2016-09-01 DIAGNOSIS — N183 Chronic kidney disease, stage 3 (moderate): Secondary | ICD-10-CM | POA: Diagnosis not present

## 2016-09-01 DIAGNOSIS — I5043 Acute on chronic combined systolic (congestive) and diastolic (congestive) heart failure: Secondary | ICD-10-CM | POA: Diagnosis not present

## 2016-09-01 NOTE — Telephone Encounter (Signed)
This has been taken care of this was just a [place]

## 2016-09-01 NOTE — Telephone Encounter (Signed)
Donald Richardson called and stated left lung diminished pt fell pos. Rib fracture hurts to take deep breath no cough no fever Donald Richardson has him doing pulm. Exercise 4 x a day

## 2016-09-01 NOTE — Telephone Encounter (Signed)
Who is Jenny Reichmann?Marland Kitchen He probably needs an x-ray so get this set up

## 2016-09-02 DIAGNOSIS — E1122 Type 2 diabetes mellitus with diabetic chronic kidney disease: Secondary | ICD-10-CM | POA: Diagnosis not present

## 2016-09-02 DIAGNOSIS — N183 Chronic kidney disease, stage 3 (moderate): Secondary | ICD-10-CM | POA: Diagnosis not present

## 2016-09-02 DIAGNOSIS — I130 Hypertensive heart and chronic kidney disease with heart failure and stage 1 through stage 4 chronic kidney disease, or unspecified chronic kidney disease: Secondary | ICD-10-CM | POA: Diagnosis not present

## 2016-09-02 DIAGNOSIS — I5043 Acute on chronic combined systolic (congestive) and diastolic (congestive) heart failure: Secondary | ICD-10-CM | POA: Diagnosis not present

## 2016-09-02 DIAGNOSIS — I69354 Hemiplegia and hemiparesis following cerebral infarction affecting left non-dominant side: Secondary | ICD-10-CM | POA: Diagnosis not present

## 2016-09-02 DIAGNOSIS — I2510 Atherosclerotic heart disease of native coronary artery without angina pectoris: Secondary | ICD-10-CM | POA: Diagnosis not present

## 2016-09-04 ENCOUNTER — Telehealth: Payer: Self-pay

## 2016-09-04 DIAGNOSIS — I69354 Hemiplegia and hemiparesis following cerebral infarction affecting left non-dominant side: Secondary | ICD-10-CM | POA: Diagnosis not present

## 2016-09-04 DIAGNOSIS — N183 Chronic kidney disease, stage 3 (moderate): Secondary | ICD-10-CM | POA: Diagnosis not present

## 2016-09-04 DIAGNOSIS — I2510 Atherosclerotic heart disease of native coronary artery without angina pectoris: Secondary | ICD-10-CM | POA: Diagnosis not present

## 2016-09-04 DIAGNOSIS — I5043 Acute on chronic combined systolic (congestive) and diastolic (congestive) heart failure: Secondary | ICD-10-CM | POA: Diagnosis not present

## 2016-09-04 DIAGNOSIS — E1122 Type 2 diabetes mellitus with diabetic chronic kidney disease: Secondary | ICD-10-CM | POA: Diagnosis not present

## 2016-09-04 DIAGNOSIS — I130 Hypertensive heart and chronic kidney disease with heart failure and stage 1 through stage 4 chronic kidney disease, or unspecified chronic kidney disease: Secondary | ICD-10-CM | POA: Diagnosis not present

## 2016-09-04 NOTE — Telephone Encounter (Signed)
Talked with Jenny Reichmann she was informed Dr.Lalonde said okay

## 2016-09-04 NOTE — Telephone Encounter (Signed)
[name] with [facility] called to let you know [phone]. She states that pt is doing much better and that the family will call to cancel appt for tomorrow. She also needs to recert patient for skilled nursing. She would like to see pt 1x/wk for 5 weeks. Victorino December

## 2016-09-04 NOTE — Telephone Encounter (Signed)
ok 

## 2016-09-05 ENCOUNTER — Telehealth: Payer: Self-pay | Admitting: Family Medicine

## 2016-09-05 ENCOUNTER — Ambulatory Visit: Payer: Medicare Other | Admitting: Family Medicine

## 2016-09-05 NOTE — Telephone Encounter (Signed)
Administrator, Civil Service with [employer] called. She would like orders to continue PT with Mr. Barg. She can be reached at [phone].

## 2016-09-05 NOTE — Telephone Encounter (Signed)
ok 

## 2016-09-05 NOTE — Telephone Encounter (Signed)
Amber informed Dr.Lalonde said okay

## 2016-09-08 ENCOUNTER — Telehealth: Payer: Self-pay | Admitting: Family Medicine

## 2016-09-08 DIAGNOSIS — E1122 Type 2 diabetes mellitus with diabetic chronic kidney disease: Secondary | ICD-10-CM | POA: Diagnosis not present

## 2016-09-08 DIAGNOSIS — I5043 Acute on chronic combined systolic (congestive) and diastolic (congestive) heart failure: Secondary | ICD-10-CM | POA: Diagnosis not present

## 2016-09-08 DIAGNOSIS — I2510 Atherosclerotic heart disease of native coronary artery without angina pectoris: Secondary | ICD-10-CM | POA: Diagnosis not present

## 2016-09-08 DIAGNOSIS — I69354 Hemiplegia and hemiparesis following cerebral infarction affecting left non-dominant side: Secondary | ICD-10-CM | POA: Diagnosis not present

## 2016-09-08 DIAGNOSIS — N183 Chronic kidney disease, stage 3 (moderate): Secondary | ICD-10-CM | POA: Diagnosis not present

## 2016-09-08 DIAGNOSIS — I130 Hypertensive heart and chronic kidney disease with heart failure and stage 1 through stage 4 chronic kidney disease, or unspecified chronic kidney disease: Secondary | ICD-10-CM | POA: Diagnosis not present

## 2016-09-08 NOTE — Telephone Encounter (Addendum)
Marsha in Occupational Therapy at [facility] called to re certify OT order to see pt 1 time per week for 4 weeks. Pt can not move his arm and Rosann Auerbach is working with pt to help with moving his arm while she sees what the Ortho doctor is going to do.Call [place] at [phone]

## 2016-09-08 NOTE — Telephone Encounter (Signed)
I called to inform Donald Richardson said okay left word for word message on VM

## 2016-09-08 NOTE — Telephone Encounter (Signed)
ok 

## 2016-09-09 DIAGNOSIS — E1122 Type 2 diabetes mellitus with diabetic chronic kidney disease: Secondary | ICD-10-CM | POA: Diagnosis not present

## 2016-09-09 DIAGNOSIS — Z87891 Personal history of nicotine dependence: Secondary | ICD-10-CM | POA: Diagnosis not present

## 2016-09-09 DIAGNOSIS — I130 Hypertensive heart and chronic kidney disease with heart failure and stage 1 through stage 4 chronic kidney disease, or unspecified chronic kidney disease: Secondary | ICD-10-CM | POA: Diagnosis not present

## 2016-09-09 DIAGNOSIS — E785 Hyperlipidemia, unspecified: Secondary | ICD-10-CM | POA: Diagnosis not present

## 2016-09-09 DIAGNOSIS — I69354 Hemiplegia and hemiparesis following cerebral infarction affecting left non-dominant side: Secondary | ICD-10-CM | POA: Diagnosis not present

## 2016-09-09 DIAGNOSIS — N183 Chronic kidney disease, stage 3 (moderate): Secondary | ICD-10-CM | POA: Diagnosis not present

## 2016-09-09 DIAGNOSIS — D696 Thrombocytopenia, unspecified: Secondary | ICD-10-CM | POA: Diagnosis not present

## 2016-09-09 DIAGNOSIS — J449 Chronic obstructive pulmonary disease, unspecified: Secondary | ICD-10-CM | POA: Diagnosis not present

## 2016-09-09 DIAGNOSIS — I2510 Atherosclerotic heart disease of native coronary artery without angina pectoris: Secondary | ICD-10-CM | POA: Diagnosis not present

## 2016-09-09 DIAGNOSIS — Z9181 History of falling: Secondary | ICD-10-CM | POA: Diagnosis not present

## 2016-09-09 DIAGNOSIS — I5043 Acute on chronic combined systolic (congestive) and diastolic (congestive) heart failure: Secondary | ICD-10-CM | POA: Diagnosis not present

## 2016-09-09 DIAGNOSIS — S42302D Unspecified fracture of shaft of humerus, left arm, subsequent encounter for fracture with routine healing: Secondary | ICD-10-CM | POA: Diagnosis not present

## 2016-09-10 DIAGNOSIS — I2510 Atherosclerotic heart disease of native coronary artery without angina pectoris: Secondary | ICD-10-CM | POA: Diagnosis not present

## 2016-09-10 DIAGNOSIS — I5043 Acute on chronic combined systolic (congestive) and diastolic (congestive) heart failure: Secondary | ICD-10-CM | POA: Diagnosis not present

## 2016-09-10 DIAGNOSIS — E1122 Type 2 diabetes mellitus with diabetic chronic kidney disease: Secondary | ICD-10-CM | POA: Diagnosis not present

## 2016-09-10 DIAGNOSIS — I130 Hypertensive heart and chronic kidney disease with heart failure and stage 1 through stage 4 chronic kidney disease, or unspecified chronic kidney disease: Secondary | ICD-10-CM | POA: Diagnosis not present

## 2016-09-10 DIAGNOSIS — I69354 Hemiplegia and hemiparesis following cerebral infarction affecting left non-dominant side: Secondary | ICD-10-CM | POA: Diagnosis not present

## 2016-09-10 DIAGNOSIS — S42302D Unspecified fracture of shaft of humerus, left arm, subsequent encounter for fracture with routine healing: Secondary | ICD-10-CM | POA: Diagnosis not present

## 2016-09-11 DIAGNOSIS — I130 Hypertensive heart and chronic kidney disease with heart failure and stage 1 through stage 4 chronic kidney disease, or unspecified chronic kidney disease: Secondary | ICD-10-CM | POA: Diagnosis not present

## 2016-09-11 DIAGNOSIS — I2510 Atherosclerotic heart disease of native coronary artery without angina pectoris: Secondary | ICD-10-CM | POA: Diagnosis not present

## 2016-09-11 DIAGNOSIS — I5043 Acute on chronic combined systolic (congestive) and diastolic (congestive) heart failure: Secondary | ICD-10-CM | POA: Diagnosis not present

## 2016-09-11 DIAGNOSIS — E1122 Type 2 diabetes mellitus with diabetic chronic kidney disease: Secondary | ICD-10-CM | POA: Diagnosis not present

## 2016-09-11 DIAGNOSIS — S42302D Unspecified fracture of shaft of humerus, left arm, subsequent encounter for fracture with routine healing: Secondary | ICD-10-CM | POA: Diagnosis not present

## 2016-09-11 DIAGNOSIS — I69354 Hemiplegia and hemiparesis following cerebral infarction affecting left non-dominant side: Secondary | ICD-10-CM | POA: Diagnosis not present

## 2016-09-12 DIAGNOSIS — S42295D Other nondisplaced fracture of upper end of left humerus, subsequent encounter for fracture with routine healing: Secondary | ICD-10-CM | POA: Diagnosis not present

## 2016-09-15 DIAGNOSIS — I130 Hypertensive heart and chronic kidney disease with heart failure and stage 1 through stage 4 chronic kidney disease, or unspecified chronic kidney disease: Secondary | ICD-10-CM | POA: Diagnosis not present

## 2016-09-15 DIAGNOSIS — I5043 Acute on chronic combined systolic (congestive) and diastolic (congestive) heart failure: Secondary | ICD-10-CM | POA: Diagnosis not present

## 2016-09-15 DIAGNOSIS — I69354 Hemiplegia and hemiparesis following cerebral infarction affecting left non-dominant side: Secondary | ICD-10-CM | POA: Diagnosis not present

## 2016-09-15 DIAGNOSIS — E1122 Type 2 diabetes mellitus with diabetic chronic kidney disease: Secondary | ICD-10-CM | POA: Diagnosis not present

## 2016-09-15 DIAGNOSIS — S42302D Unspecified fracture of shaft of humerus, left arm, subsequent encounter for fracture with routine healing: Secondary | ICD-10-CM | POA: Diagnosis not present

## 2016-09-15 DIAGNOSIS — I2510 Atherosclerotic heart disease of native coronary artery without angina pectoris: Secondary | ICD-10-CM | POA: Diagnosis not present

## 2016-09-16 DIAGNOSIS — I69354 Hemiplegia and hemiparesis following cerebral infarction affecting left non-dominant side: Secondary | ICD-10-CM | POA: Diagnosis not present

## 2016-09-16 DIAGNOSIS — E1122 Type 2 diabetes mellitus with diabetic chronic kidney disease: Secondary | ICD-10-CM | POA: Diagnosis not present

## 2016-09-16 DIAGNOSIS — S42302D Unspecified fracture of shaft of humerus, left arm, subsequent encounter for fracture with routine healing: Secondary | ICD-10-CM | POA: Diagnosis not present

## 2016-09-16 DIAGNOSIS — I2510 Atherosclerotic heart disease of native coronary artery without angina pectoris: Secondary | ICD-10-CM | POA: Diagnosis not present

## 2016-09-16 DIAGNOSIS — I5043 Acute on chronic combined systolic (congestive) and diastolic (congestive) heart failure: Secondary | ICD-10-CM | POA: Diagnosis not present

## 2016-09-16 DIAGNOSIS — I130 Hypertensive heart and chronic kidney disease with heart failure and stage 1 through stage 4 chronic kidney disease, or unspecified chronic kidney disease: Secondary | ICD-10-CM | POA: Diagnosis not present

## 2016-09-17 ENCOUNTER — Other Ambulatory Visit: Payer: Self-pay | Admitting: Internal Medicine

## 2016-09-18 DIAGNOSIS — E1122 Type 2 diabetes mellitus with diabetic chronic kidney disease: Secondary | ICD-10-CM | POA: Diagnosis not present

## 2016-09-18 DIAGNOSIS — I130 Hypertensive heart and chronic kidney disease with heart failure and stage 1 through stage 4 chronic kidney disease, or unspecified chronic kidney disease: Secondary | ICD-10-CM | POA: Diagnosis not present

## 2016-09-18 DIAGNOSIS — I5043 Acute on chronic combined systolic (congestive) and diastolic (congestive) heart failure: Secondary | ICD-10-CM | POA: Diagnosis not present

## 2016-09-18 DIAGNOSIS — I69354 Hemiplegia and hemiparesis following cerebral infarction affecting left non-dominant side: Secondary | ICD-10-CM | POA: Diagnosis not present

## 2016-09-18 DIAGNOSIS — S42302D Unspecified fracture of shaft of humerus, left arm, subsequent encounter for fracture with routine healing: Secondary | ICD-10-CM | POA: Diagnosis not present

## 2016-09-18 DIAGNOSIS — I2510 Atherosclerotic heart disease of native coronary artery without angina pectoris: Secondary | ICD-10-CM | POA: Diagnosis not present

## 2016-09-19 ENCOUNTER — Ambulatory Visit (INDEPENDENT_AMBULATORY_CARE_PROVIDER_SITE_OTHER): Payer: Medicare Other | Admitting: Family Medicine

## 2016-09-19 ENCOUNTER — Encounter: Payer: Self-pay | Admitting: Family Medicine

## 2016-09-19 VITALS — BP 110/70 | HR 63 | Ht 68.0 in | Wt 144.0 lb

## 2016-09-19 DIAGNOSIS — I639 Cerebral infarction, unspecified: Secondary | ICD-10-CM

## 2016-09-19 DIAGNOSIS — I10 Essential (primary) hypertension: Secondary | ICD-10-CM

## 2016-09-19 DIAGNOSIS — Z87891 Personal history of nicotine dependence: Secondary | ICD-10-CM | POA: Insufficient documentation

## 2016-09-19 DIAGNOSIS — I6381 Other cerebral infarction due to occlusion or stenosis of small artery: Secondary | ICD-10-CM

## 2016-09-19 DIAGNOSIS — S42412A Displaced simple supracondylar fracture without intercondylar fracture of left humerus, initial encounter for closed fracture: Secondary | ICD-10-CM

## 2016-09-19 DIAGNOSIS — E119 Type 2 diabetes mellitus without complications: Secondary | ICD-10-CM

## 2016-09-19 NOTE — Progress Notes (Signed)
Subjective:   HPI  Donald Richardson is a 76 y.o. male who presents for Chief Complaint  Patient presents with  . Medicare Wellness  . Follow-up    hospital CVA    Medical care team includes: Denita Lung, MD here for primary care  Dr.Ross cardio Dr Erlinda Hong   Preventative care: [employer] Last ophthalmology visit: 10/17 Last dental visit:? Last colonoscopy:09/16/07 Last prostate exam: ? Last EKG:08/21/16 Last labs:06/23/16  Prior vaccinations:  TD or Tdap: 06/03/07 Influenza: Pneumococcal: 23: 10/17/13  04/10/08 Shingles/Zostavax: 06/19/10 :   Advanced directive: Not asked Health care power of attorney: Living will:  Concerns: The majority of the visit was spent discussing the fact that he had a CVA and did go into rehabilitation after that then went home. He was getting PT and OT at home. He then fell fracturing his left arm. This occurred on June 20. He is now being [facility] with OT, PT and nursing visit. He also has help of [facility]. They're coming in in 24 hour shifts per day. He is supposed to be using a walker but with fracture this is making it very difficult. He did quit smoking after his CVA.  Reviewed their medical, surgical, family, social, medication, and allergy history and updated chart as appropriate.  Past Medical History:  Diagnosis Date  . ASHD (arteriosclerotic heart disease)    STENT  . COPD (chronic obstructive pulmonary disease) ([place])   . Diabetes mellitus    Hgb A1C- 5.8 in May 2017  . Hemorrhoids   . Hyperlipidemia   . Hypertension   . Smoker     Past Surgical History:  Procedure Laterality Date  . INGUINAL HERNIA REPAIR Left 10/26/2015   Procedure: LEFT INGUINAL HERNIA REPAIR WITH MESH;  Surgeon: Jackolyn Confer, MD;  [facility];  Service: General;  Laterality: Left;  . INSERTION OF MESH Left 10/26/2015   Procedure: INSERTION OF MESH;  Surgeon: Jackolyn Confer, MD;  Location: [facility];  Service: General;   Laterality: Left;  . NO PAST SURGERIES      Social History   Social History  . Marital status: Single    Spouse name: N/A  . Number of children: N/A  . Years of education: N/A   Occupational History  . Not on file.   Social History Main Topics  . Smoking status: Former Smoker    Packs/day: 1.00    Types: Cigarettes    Quit date: 06/20/2016  . Smokeless tobacco: Never Used  . Alcohol use No  . Drug use: No  . Sexual activity: Not Currently   Other Topics Concern  . Not on file   Social History Narrative  . No narrative on file    Family History  Problem Relation Age of Onset  . Breast cancer Mother   . Parkinson's disease Father      Current Outpatient Prescriptions:  .  aspirin EC 325 MG EC tablet, Take 1 tablet (325 mg total) by mouth daily., Disp: 30 tablet, Rfl: 0 .  clopidogrel (PLAVIX) 75 MG tablet, TAKE 1 TABLET BY MOUTH DAILY, Disp: 30 tablet, Rfl: 0 .  rosuvastatin (CRESTOR) 20 MG tablet, Take 1 tablet (20 mg total) by mouth at bedtime., Disp: 30 tablet, Rfl: 0 .  acetaminophen (TYLENOL) 500 MG tablet, Take 1,000 mg by mouth every 6 (six) hours as needed for mild pain., Disp: , Rfl:   No Known Allergies     Review  of Systems Negative except as above     Objective:   General appearance: alert, no distress, WD/WN, Caucasian male  Heart: RRR, normal S1, S2, no murmurs Lungs: CTA bilaterally, no wheezes, rhonchi, or rales   Assessment and Plan :    Basal ganglia infarction [facility])  Former smoker  Closed supracondylar fracture of left humerus, initial encounter  Essential hypertension  Diabetes mellitus type 2 in nonobese [facility]) His brother is with him. There were questions about him having Parkinson's disease and I explained that Luisantonio has never mentioned any problems referable to that and recommend they follow-up with neurology concerning this. Complemented him on the fact that he has quit smoking. Encouraged him to get a med alert for the  times that he does not have someone in the house. Discussed his blood pressure and at this point I will defer to Dr. Harrington Challenger concerning placing him on any other medications. He will follow-up with me in roughly 4 months. He will need his pneumonia shot updated.

## 2016-09-22 ENCOUNTER — Ambulatory Visit: Payer: Medicare Other | Admitting: Family Medicine

## 2016-09-22 ENCOUNTER — Telehealth: Payer: Self-pay | Admitting: *Deleted

## 2016-09-22 DIAGNOSIS — E1122 Type 2 diabetes mellitus with diabetic chronic kidney disease: Secondary | ICD-10-CM | POA: Diagnosis not present

## 2016-09-22 DIAGNOSIS — I69354 Hemiplegia and hemiparesis following cerebral infarction affecting left non-dominant side: Secondary | ICD-10-CM | POA: Diagnosis not present

## 2016-09-22 DIAGNOSIS — S42302D Unspecified fracture of shaft of humerus, left arm, subsequent encounter for fracture with routine healing: Secondary | ICD-10-CM | POA: Diagnosis not present

## 2016-09-22 DIAGNOSIS — I130 Hypertensive heart and chronic kidney disease with heart failure and stage 1 through stage 4 chronic kidney disease, or unspecified chronic kidney disease: Secondary | ICD-10-CM | POA: Diagnosis not present

## 2016-09-22 DIAGNOSIS — I2510 Atherosclerotic heart disease of native coronary artery without angina pectoris: Secondary | ICD-10-CM | POA: Diagnosis not present

## 2016-09-22 DIAGNOSIS — I5043 Acute on chronic combined systolic (congestive) and diastolic (congestive) heart failure: Secondary | ICD-10-CM | POA: Diagnosis not present

## 2016-09-22 NOTE — Telephone Encounter (Signed)
Called pt on 09/22/16 unable to reach pt, unable to leave  voice mail. Voice mail not set up.

## 2016-09-23 ENCOUNTER — Ambulatory Visit: Payer: Medicare Other | Admitting: Neurology

## 2016-09-23 DIAGNOSIS — I2510 Atherosclerotic heart disease of native coronary artery without angina pectoris: Secondary | ICD-10-CM | POA: Diagnosis not present

## 2016-09-23 DIAGNOSIS — I130 Hypertensive heart and chronic kidney disease with heart failure and stage 1 through stage 4 chronic kidney disease, or unspecified chronic kidney disease: Secondary | ICD-10-CM | POA: Diagnosis not present

## 2016-09-23 DIAGNOSIS — I5043 Acute on chronic combined systolic (congestive) and diastolic (congestive) heart failure: Secondary | ICD-10-CM | POA: Diagnosis not present

## 2016-09-23 DIAGNOSIS — S42302D Unspecified fracture of shaft of humerus, left arm, subsequent encounter for fracture with routine healing: Secondary | ICD-10-CM | POA: Diagnosis not present

## 2016-09-23 DIAGNOSIS — E1122 Type 2 diabetes mellitus with diabetic chronic kidney disease: Secondary | ICD-10-CM | POA: Diagnosis not present

## 2016-09-23 DIAGNOSIS — I69354 Hemiplegia and hemiparesis following cerebral infarction affecting left non-dominant side: Secondary | ICD-10-CM | POA: Diagnosis not present

## 2016-09-25 ENCOUNTER — Telehealth: Payer: Self-pay | Admitting: Family Medicine

## 2016-09-25 DIAGNOSIS — I130 Hypertensive heart and chronic kidney disease with heart failure and stage 1 through stage 4 chronic kidney disease, or unspecified chronic kidney disease: Secondary | ICD-10-CM | POA: Diagnosis not present

## 2016-09-25 DIAGNOSIS — S42302D Unspecified fracture of shaft of humerus, left arm, subsequent encounter for fracture with routine healing: Secondary | ICD-10-CM | POA: Diagnosis not present

## 2016-09-25 DIAGNOSIS — E1122 Type 2 diabetes mellitus with diabetic chronic kidney disease: Secondary | ICD-10-CM | POA: Diagnosis not present

## 2016-09-25 DIAGNOSIS — I2510 Atherosclerotic heart disease of native coronary artery without angina pectoris: Secondary | ICD-10-CM | POA: Diagnosis not present

## 2016-09-25 DIAGNOSIS — I5043 Acute on chronic combined systolic (congestive) and diastolic (congestive) heart failure: Secondary | ICD-10-CM | POA: Diagnosis not present

## 2016-09-25 DIAGNOSIS — I69354 Hemiplegia and hemiparesis following cerebral infarction affecting left non-dominant side: Secondary | ICD-10-CM | POA: Diagnosis not present

## 2016-09-25 NOTE — Telephone Encounter (Signed)
Amber has been informed and verbalized understanding

## 2016-09-25 NOTE — Telephone Encounter (Signed)
ok 

## 2016-09-25 NOTE — Telephone Encounter (Signed)
Amber from [facility] called t# [phone] did reassessment today for strength and balance and endurance and he is progressing but pt has Parkinson's type gait, working with his to reduce risk of falls.  Wants to see him 2 times a week for next 4 weeks needs verbal to continue. Is this is?

## 2016-09-26 DIAGNOSIS — I130 Hypertensive heart and chronic kidney disease with heart failure and stage 1 through stage 4 chronic kidney disease, or unspecified chronic kidney disease: Secondary | ICD-10-CM | POA: Diagnosis not present

## 2016-09-26 DIAGNOSIS — I5043 Acute on chronic combined systolic (congestive) and diastolic (congestive) heart failure: Secondary | ICD-10-CM | POA: Diagnosis not present

## 2016-09-26 DIAGNOSIS — S42302D Unspecified fracture of shaft of humerus, left arm, subsequent encounter for fracture with routine healing: Secondary | ICD-10-CM | POA: Diagnosis not present

## 2016-09-26 DIAGNOSIS — E1122 Type 2 diabetes mellitus with diabetic chronic kidney disease: Secondary | ICD-10-CM | POA: Diagnosis not present

## 2016-09-26 DIAGNOSIS — I2510 Atherosclerotic heart disease of native coronary artery without angina pectoris: Secondary | ICD-10-CM | POA: Diagnosis not present

## 2016-09-26 DIAGNOSIS — I69354 Hemiplegia and hemiparesis following cerebral infarction affecting left non-dominant side: Secondary | ICD-10-CM | POA: Diagnosis not present

## 2016-09-29 ENCOUNTER — Telehealth: Payer: Self-pay | Admitting: *Deleted

## 2016-09-29 ENCOUNTER — Telehealth: Payer: Self-pay | Admitting: Internal Medicine

## 2016-09-29 DIAGNOSIS — I69354 Hemiplegia and hemiparesis following cerebral infarction affecting left non-dominant side: Secondary | ICD-10-CM | POA: Diagnosis not present

## 2016-09-29 DIAGNOSIS — I5043 Acute on chronic combined systolic (congestive) and diastolic (congestive) heart failure: Secondary | ICD-10-CM | POA: Diagnosis not present

## 2016-09-29 DIAGNOSIS — S42302D Unspecified fracture of shaft of humerus, left arm, subsequent encounter for fracture with routine healing: Secondary | ICD-10-CM | POA: Diagnosis not present

## 2016-09-29 DIAGNOSIS — I2510 Atherosclerotic heart disease of native coronary artery without angina pectoris: Secondary | ICD-10-CM | POA: Diagnosis not present

## 2016-09-29 DIAGNOSIS — E1122 Type 2 diabetes mellitus with diabetic chronic kidney disease: Secondary | ICD-10-CM | POA: Diagnosis not present

## 2016-09-29 DIAGNOSIS — I130 Hypertensive heart and chronic kidney disease with heart failure and stage 1 through stage 4 chronic kidney disease, or unspecified chronic kidney disease: Secondary | ICD-10-CM | POA: Diagnosis not present

## 2016-09-29 NOTE — Telephone Encounter (Signed)
Alexis from [facility] called   [phone]  Re pt's heart rate of running between 42-45 pt is asymptomatic .Ubaldo Glassing was asking for parameters on when they need to call and report Will forward to Dr Harrington Challenger for review .Adonis Housekeeper

## 2016-09-29 NOTE — Telephone Encounter (Signed)
New message    Ubaldo Glassing from [facility] is calling about pt. She states pt heart rate today is 42. Pt told RN this is normal for him and Dr. Harrington Challenger is aware. She wants to know if Dr. Harrington Challenger would like to establish parameters for RN from [facility] to call when it is too low.

## 2016-09-29 NOTE — Telephone Encounter (Signed)
See other phone note ./cy 

## 2016-09-30 DIAGNOSIS — I69354 Hemiplegia and hemiparesis following cerebral infarction affecting left non-dominant side: Secondary | ICD-10-CM | POA: Diagnosis not present

## 2016-09-30 DIAGNOSIS — I130 Hypertensive heart and chronic kidney disease with heart failure and stage 1 through stage 4 chronic kidney disease, or unspecified chronic kidney disease: Secondary | ICD-10-CM | POA: Diagnosis not present

## 2016-09-30 DIAGNOSIS — I2510 Atherosclerotic heart disease of native coronary artery without angina pectoris: Secondary | ICD-10-CM | POA: Diagnosis not present

## 2016-09-30 DIAGNOSIS — S42302D Unspecified fracture of shaft of humerus, left arm, subsequent encounter for fracture with routine healing: Secondary | ICD-10-CM | POA: Diagnosis not present

## 2016-09-30 DIAGNOSIS — E1122 Type 2 diabetes mellitus with diabetic chronic kidney disease: Secondary | ICD-10-CM | POA: Diagnosis not present

## 2016-09-30 DIAGNOSIS — I5043 Acute on chronic combined systolic (congestive) and diastolic (congestive) heart failure: Secondary | ICD-10-CM | POA: Diagnosis not present

## 2016-10-01 NOTE — Telephone Encounter (Signed)
Patient has had low heart rate for years. Recent hospitalization no significant bradycardia during stay (was on telemetry) No indication for pacemaker at present   If dizzy or if BP below 100 would consider holter

## 2016-10-01 NOTE — Telephone Encounter (Signed)
Left detailed message for Ubal[facility] Glassing ([facility]) of Dr. Alan Ripper recommendations.  Advised to call back with further concerns or any questions.

## 2016-10-02 ENCOUNTER — Telehealth: Payer: Self-pay | Admitting: Internal Medicine

## 2016-10-02 ENCOUNTER — Telehealth: Payer: Self-pay | Admitting: Family Medicine

## 2016-10-02 DIAGNOSIS — I69354 Hemiplegia and hemiparesis following cerebral infarction affecting left non-dominant side: Secondary | ICD-10-CM | POA: Diagnosis not present

## 2016-10-02 DIAGNOSIS — I130 Hypertensive heart and chronic kidney disease with heart failure and stage 1 through stage 4 chronic kidney disease, or unspecified chronic kidney disease: Secondary | ICD-10-CM | POA: Diagnosis not present

## 2016-10-02 DIAGNOSIS — E1122 Type 2 diabetes mellitus with diabetic chronic kidney disease: Secondary | ICD-10-CM | POA: Diagnosis not present

## 2016-10-02 DIAGNOSIS — S42302D Unspecified fracture of shaft of humerus, left arm, subsequent encounter for fracture with routine healing: Secondary | ICD-10-CM | POA: Diagnosis not present

## 2016-10-02 DIAGNOSIS — I5043 Acute on chronic combined systolic (congestive) and diastolic (congestive) heart failure: Secondary | ICD-10-CM | POA: Diagnosis not present

## 2016-10-02 DIAGNOSIS — I2510 Atherosclerotic heart disease of native coronary artery without angina pectoris: Secondary | ICD-10-CM | POA: Diagnosis not present

## 2016-10-02 NOTE — Telephone Encounter (Signed)
[facility] nurse called to report low heart rate of 40 for pt.  Dr. Redmond School informed and he had me tell her to call and inform pt's cardiologist.

## 2016-10-02 NOTE — Telephone Encounter (Signed)
New message    Rosann Auerbach, Occupation Therapist is calling stating that pt hr is 41. She states pt has no symptoms.

## 2016-10-02 NOTE — Telephone Encounter (Signed)
Call transferred into triage, occupational therapist working with patient today.  He was in bed for all of the exercises and she assisted him out of bed at the end of the visit.  She reports his HR stayed slow, as low as 40 during the visit.  He had no dizziness, lightheadness.  BP was 120s/70s.   Advised of Dr. Alan Ripper recommendations from previous phone encounter.  Fay Records, MD  to Me     9:41 AM  Note    Patient has had low heart rate for years. Recent hospitalization no significant bradycardia during stay (was on telemetry) No indication for pacemaker at present   If dizzy or if BP below 100 would consider holter         She verbalizes understanding and appreciation for speaking with her.

## 2016-10-06 DIAGNOSIS — I69354 Hemiplegia and hemiparesis following cerebral infarction affecting left non-dominant side: Secondary | ICD-10-CM | POA: Diagnosis not present

## 2016-10-06 DIAGNOSIS — I2510 Atherosclerotic heart disease of native coronary artery without angina pectoris: Secondary | ICD-10-CM | POA: Diagnosis not present

## 2016-10-06 DIAGNOSIS — I130 Hypertensive heart and chronic kidney disease with heart failure and stage 1 through stage 4 chronic kidney disease, or unspecified chronic kidney disease: Secondary | ICD-10-CM | POA: Diagnosis not present

## 2016-10-06 DIAGNOSIS — S42302D Unspecified fracture of shaft of humerus, left arm, subsequent encounter for fracture with routine healing: Secondary | ICD-10-CM | POA: Diagnosis not present

## 2016-10-06 DIAGNOSIS — I5043 Acute on chronic combined systolic (congestive) and diastolic (congestive) heart failure: Secondary | ICD-10-CM | POA: Diagnosis not present

## 2016-10-06 DIAGNOSIS — E1122 Type 2 diabetes mellitus with diabetic chronic kidney disease: Secondary | ICD-10-CM | POA: Diagnosis not present

## 2016-10-08 DIAGNOSIS — I69354 Hemiplegia and hemiparesis following cerebral infarction affecting left non-dominant side: Secondary | ICD-10-CM | POA: Diagnosis not present

## 2016-10-08 DIAGNOSIS — I2510 Atherosclerotic heart disease of native coronary artery without angina pectoris: Secondary | ICD-10-CM | POA: Diagnosis not present

## 2016-10-08 DIAGNOSIS — E1122 Type 2 diabetes mellitus with diabetic chronic kidney disease: Secondary | ICD-10-CM | POA: Diagnosis not present

## 2016-10-08 DIAGNOSIS — I130 Hypertensive heart and chronic kidney disease with heart failure and stage 1 through stage 4 chronic kidney disease, or unspecified chronic kidney disease: Secondary | ICD-10-CM | POA: Diagnosis not present

## 2016-10-08 DIAGNOSIS — I5043 Acute on chronic combined systolic (congestive) and diastolic (congestive) heart failure: Secondary | ICD-10-CM | POA: Diagnosis not present

## 2016-10-08 DIAGNOSIS — S42302D Unspecified fracture of shaft of humerus, left arm, subsequent encounter for fracture with routine healing: Secondary | ICD-10-CM | POA: Diagnosis not present

## 2016-10-09 DIAGNOSIS — S42302D Unspecified fracture of shaft of humerus, left arm, subsequent encounter for fracture with routine healing: Secondary | ICD-10-CM | POA: Diagnosis not present

## 2016-10-09 DIAGNOSIS — I69354 Hemiplegia and hemiparesis following cerebral infarction affecting left non-dominant side: Secondary | ICD-10-CM | POA: Diagnosis not present

## 2016-10-09 DIAGNOSIS — I5043 Acute on chronic combined systolic (congestive) and diastolic (congestive) heart failure: Secondary | ICD-10-CM | POA: Diagnosis not present

## 2016-10-09 DIAGNOSIS — E1122 Type 2 diabetes mellitus with diabetic chronic kidney disease: Secondary | ICD-10-CM | POA: Diagnosis not present

## 2016-10-09 DIAGNOSIS — I130 Hypertensive heart and chronic kidney disease with heart failure and stage 1 through stage 4 chronic kidney disease, or unspecified chronic kidney disease: Secondary | ICD-10-CM | POA: Diagnosis not present

## 2016-10-09 DIAGNOSIS — I2510 Atherosclerotic heart disease of native coronary artery without angina pectoris: Secondary | ICD-10-CM | POA: Diagnosis not present

## 2016-10-10 DIAGNOSIS — I5043 Acute on chronic combined systolic (congestive) and diastolic (congestive) heart failure: Secondary | ICD-10-CM | POA: Diagnosis not present

## 2016-10-10 DIAGNOSIS — I69354 Hemiplegia and hemiparesis following cerebral infarction affecting left non-dominant side: Secondary | ICD-10-CM | POA: Diagnosis not present

## 2016-10-10 DIAGNOSIS — I130 Hypertensive heart and chronic kidney disease with heart failure and stage 1 through stage 4 chronic kidney disease, or unspecified chronic kidney disease: Secondary | ICD-10-CM | POA: Diagnosis not present

## 2016-10-10 DIAGNOSIS — S42302D Unspecified fracture of shaft of humerus, left arm, subsequent encounter for fracture with routine healing: Secondary | ICD-10-CM | POA: Diagnosis not present

## 2016-10-10 DIAGNOSIS — I2510 Atherosclerotic heart disease of native coronary artery without angina pectoris: Secondary | ICD-10-CM | POA: Diagnosis not present

## 2016-10-10 DIAGNOSIS — E1122 Type 2 diabetes mellitus with diabetic chronic kidney disease: Secondary | ICD-10-CM | POA: Diagnosis not present

## 2016-10-10 DIAGNOSIS — S42295D Other nondisplaced fracture of upper end of left humerus, subsequent encounter for fracture with routine healing: Secondary | ICD-10-CM | POA: Diagnosis not present

## 2016-10-13 DIAGNOSIS — S42302D Unspecified fracture of shaft of humerus, left arm, subsequent encounter for fracture with routine healing: Secondary | ICD-10-CM | POA: Diagnosis not present

## 2016-10-13 DIAGNOSIS — I69354 Hemiplegia and hemiparesis following cerebral infarction affecting left non-dominant side: Secondary | ICD-10-CM | POA: Diagnosis not present

## 2016-10-13 DIAGNOSIS — I5043 Acute on chronic combined systolic (congestive) and diastolic (congestive) heart failure: Secondary | ICD-10-CM | POA: Diagnosis not present

## 2016-10-13 DIAGNOSIS — E1122 Type 2 diabetes mellitus with diabetic chronic kidney disease: Secondary | ICD-10-CM | POA: Diagnosis not present

## 2016-10-13 DIAGNOSIS — I130 Hypertensive heart and chronic kidney disease with heart failure and stage 1 through stage 4 chronic kidney disease, or unspecified chronic kidney disease: Secondary | ICD-10-CM | POA: Diagnosis not present

## 2016-10-13 DIAGNOSIS — I2510 Atherosclerotic heart disease of native coronary artery without angina pectoris: Secondary | ICD-10-CM | POA: Diagnosis not present

## 2016-10-14 DIAGNOSIS — S42302D Unspecified fracture of shaft of humerus, left arm, subsequent encounter for fracture with routine healing: Secondary | ICD-10-CM | POA: Diagnosis not present

## 2016-10-14 DIAGNOSIS — E1122 Type 2 diabetes mellitus with diabetic chronic kidney disease: Secondary | ICD-10-CM | POA: Diagnosis not present

## 2016-10-14 DIAGNOSIS — I130 Hypertensive heart and chronic kidney disease with heart failure and stage 1 through stage 4 chronic kidney disease, or unspecified chronic kidney disease: Secondary | ICD-10-CM | POA: Diagnosis not present

## 2016-10-14 DIAGNOSIS — I69354 Hemiplegia and hemiparesis following cerebral infarction affecting left non-dominant side: Secondary | ICD-10-CM | POA: Diagnosis not present

## 2016-10-14 DIAGNOSIS — I2510 Atherosclerotic heart disease of native coronary artery without angina pectoris: Secondary | ICD-10-CM | POA: Diagnosis not present

## 2016-10-14 DIAGNOSIS — I5043 Acute on chronic combined systolic (congestive) and diastolic (congestive) heart failure: Secondary | ICD-10-CM | POA: Diagnosis not present

## 2016-10-15 DIAGNOSIS — E1122 Type 2 diabetes mellitus with diabetic chronic kidney disease: Secondary | ICD-10-CM | POA: Diagnosis not present

## 2016-10-15 DIAGNOSIS — I5043 Acute on chronic combined systolic (congestive) and diastolic (congestive) heart failure: Secondary | ICD-10-CM | POA: Diagnosis not present

## 2016-10-15 DIAGNOSIS — I69354 Hemiplegia and hemiparesis following cerebral infarction affecting left non-dominant side: Secondary | ICD-10-CM | POA: Diagnosis not present

## 2016-10-15 DIAGNOSIS — I130 Hypertensive heart and chronic kidney disease with heart failure and stage 1 through stage 4 chronic kidney disease, or unspecified chronic kidney disease: Secondary | ICD-10-CM | POA: Diagnosis not present

## 2016-10-15 DIAGNOSIS — S42302D Unspecified fracture of shaft of humerus, left arm, subsequent encounter for fracture with routine healing: Secondary | ICD-10-CM | POA: Diagnosis not present

## 2016-10-15 DIAGNOSIS — I2510 Atherosclerotic heart disease of native coronary artery without angina pectoris: Secondary | ICD-10-CM | POA: Diagnosis not present

## 2016-10-16 DIAGNOSIS — E1122 Type 2 diabetes mellitus with diabetic chronic kidney disease: Secondary | ICD-10-CM | POA: Diagnosis not present

## 2016-10-16 DIAGNOSIS — S42302D Unspecified fracture of shaft of humerus, left arm, subsequent encounter for fracture with routine healing: Secondary | ICD-10-CM | POA: Diagnosis not present

## 2016-10-16 DIAGNOSIS — I5043 Acute on chronic combined systolic (congestive) and diastolic (congestive) heart failure: Secondary | ICD-10-CM | POA: Diagnosis not present

## 2016-10-16 DIAGNOSIS — I69354 Hemiplegia and hemiparesis following cerebral infarction affecting left non-dominant side: Secondary | ICD-10-CM | POA: Diagnosis not present

## 2016-10-16 DIAGNOSIS — I2510 Atherosclerotic heart disease of native coronary artery without angina pectoris: Secondary | ICD-10-CM | POA: Diagnosis not present

## 2016-10-16 DIAGNOSIS — I130 Hypertensive heart and chronic kidney disease with heart failure and stage 1 through stage 4 chronic kidney disease, or unspecified chronic kidney disease: Secondary | ICD-10-CM | POA: Diagnosis not present

## 2016-10-17 ENCOUNTER — Other Ambulatory Visit: Payer: Self-pay | Admitting: Internal Medicine

## 2016-10-17 DIAGNOSIS — I5043 Acute on chronic combined systolic (congestive) and diastolic (congestive) heart failure: Secondary | ICD-10-CM | POA: Diagnosis not present

## 2016-10-17 DIAGNOSIS — E1122 Type 2 diabetes mellitus with diabetic chronic kidney disease: Secondary | ICD-10-CM | POA: Diagnosis not present

## 2016-10-17 DIAGNOSIS — I2510 Atherosclerotic heart disease of native coronary artery without angina pectoris: Secondary | ICD-10-CM | POA: Diagnosis not present

## 2016-10-17 DIAGNOSIS — S42302D Unspecified fracture of shaft of humerus, left arm, subsequent encounter for fracture with routine healing: Secondary | ICD-10-CM | POA: Diagnosis not present

## 2016-10-17 DIAGNOSIS — I69354 Hemiplegia and hemiparesis following cerebral infarction affecting left non-dominant side: Secondary | ICD-10-CM | POA: Diagnosis not present

## 2016-10-17 DIAGNOSIS — I130 Hypertensive heart and chronic kidney disease with heart failure and stage 1 through stage 4 chronic kidney disease, or unspecified chronic kidney disease: Secondary | ICD-10-CM | POA: Diagnosis not present

## 2016-10-20 DIAGNOSIS — I69354 Hemiplegia and hemiparesis following cerebral infarction affecting left non-dominant side: Secondary | ICD-10-CM | POA: Diagnosis not present

## 2016-10-20 DIAGNOSIS — I2510 Atherosclerotic heart disease of native coronary artery without angina pectoris: Secondary | ICD-10-CM | POA: Diagnosis not present

## 2016-10-20 DIAGNOSIS — I5043 Acute on chronic combined systolic (congestive) and diastolic (congestive) heart failure: Secondary | ICD-10-CM | POA: Diagnosis not present

## 2016-10-20 DIAGNOSIS — I130 Hypertensive heart and chronic kidney disease with heart failure and stage 1 through stage 4 chronic kidney disease, or unspecified chronic kidney disease: Secondary | ICD-10-CM | POA: Diagnosis not present

## 2016-10-20 DIAGNOSIS — S42302D Unspecified fracture of shaft of humerus, left arm, subsequent encounter for fracture with routine healing: Secondary | ICD-10-CM | POA: Diagnosis not present

## 2016-10-20 DIAGNOSIS — E1122 Type 2 diabetes mellitus with diabetic chronic kidney disease: Secondary | ICD-10-CM | POA: Diagnosis not present

## 2016-10-22 ENCOUNTER — Telehealth: Payer: Self-pay

## 2016-10-22 DIAGNOSIS — I69354 Hemiplegia and hemiparesis following cerebral infarction affecting left non-dominant side: Secondary | ICD-10-CM | POA: Diagnosis not present

## 2016-10-22 DIAGNOSIS — I5043 Acute on chronic combined systolic (congestive) and diastolic (congestive) heart failure: Secondary | ICD-10-CM | POA: Diagnosis not present

## 2016-10-22 DIAGNOSIS — E1122 Type 2 diabetes mellitus with diabetic chronic kidney disease: Secondary | ICD-10-CM | POA: Diagnosis not present

## 2016-10-22 DIAGNOSIS — I130 Hypertensive heart and chronic kidney disease with heart failure and stage 1 through stage 4 chronic kidney disease, or unspecified chronic kidney disease: Secondary | ICD-10-CM | POA: Diagnosis not present

## 2016-10-22 DIAGNOSIS — I2510 Atherosclerotic heart disease of native coronary artery without angina pectoris: Secondary | ICD-10-CM | POA: Diagnosis not present

## 2016-10-22 DIAGNOSIS — S42302D Unspecified fracture of shaft of humerus, left arm, subsequent encounter for fracture with routine healing: Secondary | ICD-10-CM | POA: Diagnosis not present

## 2016-10-22 NOTE — Telephone Encounter (Signed)
admanda with [facility] called and wanted to get a verbal for contention of physical therapy? And she said that his now showing signs of parkinson  Tremors of hands , shuffling of his feet    He has an appt in sept neurologist but just wanted you to know . Estill Bamberg phone is [phone]

## 2016-10-22 NOTE — Telephone Encounter (Signed)
ok 

## 2016-10-23 NOTE — Telephone Encounter (Signed)
Called Amber at Physical Therapy reached secured voice mail and advised ok to continue physical therapy

## 2016-10-24 DIAGNOSIS — E1122 Type 2 diabetes mellitus with diabetic chronic kidney disease: Secondary | ICD-10-CM | POA: Diagnosis not present

## 2016-10-24 DIAGNOSIS — S42302D Unspecified fracture of shaft of humerus, left arm, subsequent encounter for fracture with routine healing: Secondary | ICD-10-CM | POA: Diagnosis not present

## 2016-10-24 DIAGNOSIS — I2510 Atherosclerotic heart disease of native coronary artery without angina pectoris: Secondary | ICD-10-CM | POA: Diagnosis not present

## 2016-10-24 DIAGNOSIS — I69354 Hemiplegia and hemiparesis following cerebral infarction affecting left non-dominant side: Secondary | ICD-10-CM | POA: Diagnosis not present

## 2016-10-24 DIAGNOSIS — I130 Hypertensive heart and chronic kidney disease with heart failure and stage 1 through stage 4 chronic kidney disease, or unspecified chronic kidney disease: Secondary | ICD-10-CM | POA: Diagnosis not present

## 2016-10-24 DIAGNOSIS — I5043 Acute on chronic combined systolic (congestive) and diastolic (congestive) heart failure: Secondary | ICD-10-CM | POA: Diagnosis not present

## 2016-10-27 DIAGNOSIS — I130 Hypertensive heart and chronic kidney disease with heart failure and stage 1 through stage 4 chronic kidney disease, or unspecified chronic kidney disease: Secondary | ICD-10-CM | POA: Diagnosis not present

## 2016-10-27 DIAGNOSIS — I2510 Atherosclerotic heart disease of native coronary artery without angina pectoris: Secondary | ICD-10-CM | POA: Diagnosis not present

## 2016-10-27 DIAGNOSIS — S42302D Unspecified fracture of shaft of humerus, left arm, subsequent encounter for fracture with routine healing: Secondary | ICD-10-CM | POA: Diagnosis not present

## 2016-10-27 DIAGNOSIS — I5043 Acute on chronic combined systolic (congestive) and diastolic (congestive) heart failure: Secondary | ICD-10-CM | POA: Diagnosis not present

## 2016-10-27 DIAGNOSIS — E1122 Type 2 diabetes mellitus with diabetic chronic kidney disease: Secondary | ICD-10-CM | POA: Diagnosis not present

## 2016-10-27 DIAGNOSIS — I69354 Hemiplegia and hemiparesis following cerebral infarction affecting left non-dominant side: Secondary | ICD-10-CM | POA: Diagnosis not present

## 2016-10-29 DIAGNOSIS — I5043 Acute on chronic combined systolic (congestive) and diastolic (congestive) heart failure: Secondary | ICD-10-CM | POA: Diagnosis not present

## 2016-10-29 DIAGNOSIS — I2510 Atherosclerotic heart disease of native coronary artery without angina pectoris: Secondary | ICD-10-CM | POA: Diagnosis not present

## 2016-10-29 DIAGNOSIS — I130 Hypertensive heart and chronic kidney disease with heart failure and stage 1 through stage 4 chronic kidney disease, or unspecified chronic kidney disease: Secondary | ICD-10-CM | POA: Diagnosis not present

## 2016-10-29 DIAGNOSIS — E1122 Type 2 diabetes mellitus with diabetic chronic kidney disease: Secondary | ICD-10-CM | POA: Diagnosis not present

## 2016-10-29 DIAGNOSIS — I69354 Hemiplegia and hemiparesis following cerebral infarction affecting left non-dominant side: Secondary | ICD-10-CM | POA: Diagnosis not present

## 2016-10-29 DIAGNOSIS — S42302D Unspecified fracture of shaft of humerus, left arm, subsequent encounter for fracture with routine healing: Secondary | ICD-10-CM | POA: Diagnosis not present

## 2016-10-30 DIAGNOSIS — I5043 Acute on chronic combined systolic (congestive) and diastolic (congestive) heart failure: Secondary | ICD-10-CM | POA: Diagnosis not present

## 2016-10-30 DIAGNOSIS — I130 Hypertensive heart and chronic kidney disease with heart failure and stage 1 through stage 4 chronic kidney disease, or unspecified chronic kidney disease: Secondary | ICD-10-CM | POA: Diagnosis not present

## 2016-10-30 DIAGNOSIS — S42302D Unspecified fracture of shaft of humerus, left arm, subsequent encounter for fracture with routine healing: Secondary | ICD-10-CM | POA: Diagnosis not present

## 2016-10-30 DIAGNOSIS — E1122 Type 2 diabetes mellitus with diabetic chronic kidney disease: Secondary | ICD-10-CM | POA: Diagnosis not present

## 2016-10-30 DIAGNOSIS — I69354 Hemiplegia and hemiparesis following cerebral infarction affecting left non-dominant side: Secondary | ICD-10-CM | POA: Diagnosis not present

## 2016-10-30 DIAGNOSIS — I2510 Atherosclerotic heart disease of native coronary artery without angina pectoris: Secondary | ICD-10-CM | POA: Diagnosis not present

## 2016-11-03 DIAGNOSIS — I5043 Acute on chronic combined systolic (congestive) and diastolic (congestive) heart failure: Secondary | ICD-10-CM | POA: Diagnosis not present

## 2016-11-03 DIAGNOSIS — E1122 Type 2 diabetes mellitus with diabetic chronic kidney disease: Secondary | ICD-10-CM | POA: Diagnosis not present

## 2016-11-03 DIAGNOSIS — S42302D Unspecified fracture of shaft of humerus, left arm, subsequent encounter for fracture with routine healing: Secondary | ICD-10-CM | POA: Diagnosis not present

## 2016-11-03 DIAGNOSIS — I69354 Hemiplegia and hemiparesis following cerebral infarction affecting left non-dominant side: Secondary | ICD-10-CM | POA: Diagnosis not present

## 2016-11-03 DIAGNOSIS — I2510 Atherosclerotic heart disease of native coronary artery without angina pectoris: Secondary | ICD-10-CM | POA: Diagnosis not present

## 2016-11-03 DIAGNOSIS — I130 Hypertensive heart and chronic kidney disease with heart failure and stage 1 through stage 4 chronic kidney disease, or unspecified chronic kidney disease: Secondary | ICD-10-CM | POA: Diagnosis not present

## 2016-11-04 ENCOUNTER — Telehealth: Payer: Self-pay | Admitting: Family Medicine

## 2016-11-04 NOTE — Telephone Encounter (Signed)
ok 

## 2016-11-04 NOTE — Telephone Encounter (Signed)
Amber from [facility] called and is requesting out patient pt to continue  at the office of [facility]  They need a written script  Sent to [phone] amber can be reached at [phone] with any questions

## 2016-11-04 NOTE — Telephone Encounter (Signed)
Faxed order to [facility] at [phone]. Victorino December

## 2016-11-05 DIAGNOSIS — E1122 Type 2 diabetes mellitus with diabetic chronic kidney disease: Secondary | ICD-10-CM | POA: Diagnosis not present

## 2016-11-05 DIAGNOSIS — I130 Hypertensive heart and chronic kidney disease with heart failure and stage 1 through stage 4 chronic kidney disease, or unspecified chronic kidney disease: Secondary | ICD-10-CM | POA: Diagnosis not present

## 2016-11-05 DIAGNOSIS — I5043 Acute on chronic combined systolic (congestive) and diastolic (congestive) heart failure: Secondary | ICD-10-CM | POA: Diagnosis not present

## 2016-11-05 DIAGNOSIS — I69354 Hemiplegia and hemiparesis following cerebral infarction affecting left non-dominant side: Secondary | ICD-10-CM | POA: Diagnosis not present

## 2016-11-05 DIAGNOSIS — I2510 Atherosclerotic heart disease of native coronary artery without angina pectoris: Secondary | ICD-10-CM | POA: Diagnosis not present

## 2016-11-05 DIAGNOSIS — S42302D Unspecified fracture of shaft of humerus, left arm, subsequent encounter for fracture with routine healing: Secondary | ICD-10-CM | POA: Diagnosis not present

## 2016-11-06 ENCOUNTER — Other Ambulatory Visit: Payer: Self-pay | Admitting: Internal Medicine

## 2016-11-06 DIAGNOSIS — I130 Hypertensive heart and chronic kidney disease with heart failure and stage 1 through stage 4 chronic kidney disease, or unspecified chronic kidney disease: Secondary | ICD-10-CM | POA: Diagnosis not present

## 2016-11-06 DIAGNOSIS — S42302D Unspecified fracture of shaft of humerus, left arm, subsequent encounter for fracture with routine healing: Secondary | ICD-10-CM | POA: Diagnosis not present

## 2016-11-06 DIAGNOSIS — I2510 Atherosclerotic heart disease of native coronary artery without angina pectoris: Secondary | ICD-10-CM | POA: Diagnosis not present

## 2016-11-06 DIAGNOSIS — E1122 Type 2 diabetes mellitus with diabetic chronic kidney disease: Secondary | ICD-10-CM | POA: Diagnosis not present

## 2016-11-06 DIAGNOSIS — I5043 Acute on chronic combined systolic (congestive) and diastolic (congestive) heart failure: Secondary | ICD-10-CM | POA: Diagnosis not present

## 2016-11-06 DIAGNOSIS — I69354 Hemiplegia and hemiparesis following cerebral infarction affecting left non-dominant side: Secondary | ICD-10-CM | POA: Diagnosis not present

## 2016-11-07 NOTE — Telephone Encounter (Signed)
Pt needs to call and schedule appointment for further refills 

## 2016-11-10 DIAGNOSIS — M25612 Stiffness of left shoulder, not elsewhere classified: Secondary | ICD-10-CM | POA: Diagnosis not present

## 2016-11-10 DIAGNOSIS — M25512 Pain in left shoulder: Secondary | ICD-10-CM | POA: Diagnosis not present

## 2016-11-14 DIAGNOSIS — R2689 Other abnormalities of gait and mobility: Secondary | ICD-10-CM | POA: Diagnosis not present

## 2016-11-14 DIAGNOSIS — M6281 Muscle weakness (generalized): Secondary | ICD-10-CM | POA: Diagnosis not present

## 2016-11-17 DIAGNOSIS — M25512 Pain in left shoulder: Secondary | ICD-10-CM | POA: Diagnosis not present

## 2016-11-17 DIAGNOSIS — M25612 Stiffness of left shoulder, not elsewhere classified: Secondary | ICD-10-CM | POA: Diagnosis not present

## 2016-11-18 DIAGNOSIS — M6281 Muscle weakness (generalized): Secondary | ICD-10-CM | POA: Diagnosis not present

## 2016-11-18 DIAGNOSIS — M25612 Stiffness of left shoulder, not elsewhere classified: Secondary | ICD-10-CM | POA: Diagnosis not present

## 2016-11-18 DIAGNOSIS — R2689 Other abnormalities of gait and mobility: Secondary | ICD-10-CM | POA: Diagnosis not present

## 2016-11-18 DIAGNOSIS — M25512 Pain in left shoulder: Secondary | ICD-10-CM | POA: Diagnosis not present

## 2016-11-20 DIAGNOSIS — M25512 Pain in left shoulder: Secondary | ICD-10-CM | POA: Diagnosis not present

## 2016-11-20 DIAGNOSIS — M25612 Stiffness of left shoulder, not elsewhere classified: Secondary | ICD-10-CM | POA: Diagnosis not present

## 2016-11-21 ENCOUNTER — Encounter: Payer: Self-pay | Admitting: Diagnostic Neuroimaging

## 2016-11-21 ENCOUNTER — Ambulatory Visit (INDEPENDENT_AMBULATORY_CARE_PROVIDER_SITE_OTHER): Payer: Medicare Other | Admitting: Diagnostic Neuroimaging

## 2016-11-21 VITALS — BP 153/78 | HR 47 | Ht 68.0 in | Wt 145.6 lb

## 2016-11-21 DIAGNOSIS — I63311 Cerebral infarction due to thrombosis of right middle cerebral artery: Secondary | ICD-10-CM

## 2016-11-21 DIAGNOSIS — I639 Cerebral infarction, unspecified: Secondary | ICD-10-CM | POA: Diagnosis not present

## 2016-11-21 DIAGNOSIS — G20 Parkinson's disease: Secondary | ICD-10-CM | POA: Diagnosis not present

## 2016-11-21 MED ORDER — CARBIDOPA-LEVODOPA 25-100 MG PO TABS: 1.0000 | ORAL_TABLET | Freq: Three times a day (TID) | ORAL | 6 refills | Status: DC

## 2016-11-21 NOTE — Patient Instructions (Signed)
Thank you for coming to see us at [facility]. I hope we have been able to provide you high quality care today.  You may receive a patient satisfaction survey over the next few weeks. We would appreciate your feedback and comments so that we may continue to improve ourselves and the health of our patients.   STROKE PREVENTION - continue aspirin 325mg + clopidogrel 75mg daily - continue crestor - monitor BP at home and follow up with PCP  PARKINSON'S DISEASE - trial of carb/levo 25/100 --> half tab three times a day x 2 weeks, then increase to 1 tab three times a day  - continue home health aids and therapy    ~~~~~~~~~~~~~~~~~~~~~~~~~~~~~~~~~~~~~~~~~~~~~~~~~~~~~~~~~~~~~~~~~  DR. PENUMALLI'S GUIDE TO HAPPY AND HEALTHY LIVING These are some of my general health and wellness recommendations. Some of them may apply to you better than others. Please use common sense as you try these suggestions and feel free to ask me any questions.   ACTIVITY/FITNESS Mental, social, emotional and physical stimulation are very important for brain and body health. Try learning a new activity (arts, music, language, sports, games).  Keep moving your body to the best of your abilities.   NUTRITION Eat more plants: colorful vegetables, nuts, seeds and berries.  Eat less sugar, salt, preservatives and processed foods.  Avoid toxins such as cigarettes and alcohol.  Drink water when you are thirsty. Warm water with a slice of lemon is an excellent morning drink to start the day.  Consider these websites for more information The Nutrition Source (https://www.hsph.[employer].edu/nutritionsource) Precision Nutrition (www.precisionnutrition.com/blog/infographics)   RELAXATION Consider practicing mindfulness meditation or other relaxation techniques such as deep breathing, prayer, yoga, tai chi, massage. See website mindful.org or the apps Headspace or Calm to help get  started.   SLEEP Try to get at least 7-8+ hours sleep per day. Regular exercise and reduced caffeine will help you sleep better. Practice good sleep hygeine techniques. See website sleep.org for more information.   PLANNING Prepare estate planning, living will, healthcare POA documents. Sometimes this is best planned with the help of an attorney. Theconversationproject.org and agingwithdignity.org are excellent resources.  

## 2016-11-21 NOTE — Progress Notes (Signed)
[facility]  PATIENT: Donald Richardson DOB: February 10, 1941  REFERRING CLINICIAN: Redmond School, J HISTORY FROM: patient  REASON FOR VISIT: new consult    HISTORICAL  CHIEF COMPLAINT:  Chief Complaint  Patient presents with  . NP [facility] / Dr. Redmond School  . PD screening    Follow up for stroke 06/20/16.  Is currently getting outpt ortho rehab.PT,  Had a fall 08/2016,  L sided weakness.  Using walker.  Also has R arm tremor for 30 yrs per pt.  family hx PD.  Brother wanting PD screening today.     HISTORY OF PRESENT ILLNESS:   76 year old male here for evaluation of stroke hospital discharge. Also requested for evaluation of possible Parkinson's disease.  06/20/16 patient was admitted to the hospital due to gait and balance difficulties, multiple falls. Symptoms lasted for 2 days before patient went to the hospital. He was diagnosed with right brain subcortical ischemic infarction. Stroke workup was completed. He was exposed to follow-up in neurology clinic earlier after his discharge.  Since that time patient had further balance and gait difficulty, with a severe fall in June resulting in several fractures. Now patient having significant difficulty with his activities of daily living. He lives alone but has a caregiver come every day for 8 hours. His brother lives in [place] but visits him once a week.  Also of note patient has had at least 10-15 year history of progressive resting tremor, stooped posture, shuffling gait, according to patient's brother. Patient feels like he has had right hand tremor for almost 30 years. Patient's father had Parkinson's disease.    REVIEW OF SYSTEMS: Full 14 system review of systems performed and negative with exception of: Hearing loss rash skin sensitivity tremor weakness decreased energy.  ALLERGIES: No Known Allergies  HOME MEDICATIONS: Outpatient Medications Prior to Visit  Medication Sig Dispense Refill  . acetaminophen  (TYLENOL) 500 MG tablet Take 1,000 mg by mouth every 6 (six) hours as needed for mild pain.    Marland Kitchen aspirin EC 325 MG EC tablet Take 1 tablet (325 mg total) by mouth daily. 30 tablet 0  . clopidogrel (PLAVIX) 75 MG tablet TAKE 1 TABLET BY MOUTH EVERY DAY. NEEDS APPT FOR MORE REFILLS 15 tablet 0  . rosuvastatin (CRESTOR) 20 MG tablet Take 1 tablet (20 mg total) by mouth at bedtime. 30 tablet 0   No facility-administered medications prior to visit.     PAST MEDICAL HISTORY: Past Medical History:  Diagnosis Date  . ASHD (arteriosclerotic heart disease)    STENT  . COPD (chronic obstructive pulmonary disease) ([place])   . Diabetes mellitus    Hgb A1C- 5.8 in May 2017  . Hemorrhoids   . Hyperlipidemia   . Hypertension   . Smoker     PAST SURGICAL HISTORY: Past Surgical History:  Procedure Laterality Date  . INGUINAL HERNIA REPAIR Left 10/26/2015   Procedure: LEFT INGUINAL HERNIA REPAIR WITH MESH;  Surgeon: Jackolyn Confer, MD;  Location: [facility];  Service: General;  Laterality: Left;  . INSERTION OF MESH Left 10/26/2015   Procedure: INSERTION OF MESH;  Surgeon: Jackolyn Confer, MD;  Location: [facility];  Service: General;  Laterality: Left;  . NO PAST SURGERIES      FAMILY HISTORY: Family History  Problem Relation Age of Onset  . Breast cancer Mother   . Parkinson's disease Father     SOCIAL HISTORY:  Social History   Social History  . Marital status: Single  Spouse name: N/A  . Number of children: N/A  . Years of education: N/A   Occupational History  . Not on file.   Social History Main Topics  . Smoking status: Former Smoker    Packs/day: 1.00    Types: Cigarettes    Quit date: 06/20/2016  . Smokeless tobacco: Never Used  . Alcohol use No  . Drug use: No  . Sexual activity: Not Currently   Other Topics Concern  . Not on file   Social History Narrative  . No narrative on file     PHYSICAL EXAM  GENERAL EXAM/CONSTITUTIONAL: Vitals:  Vitals:   11/21/16 1028    BP: (!) 153/78  Pulse: (!) 47  Weight: 145 lb 9.6 oz (66 kg)  Height: 5\' 8"  (1.727 m)     Body mass index is 22.14 kg/m.  No exam data present  Patient is in no distress; well developed, nourished and groomed; neck is supple  CARDIOVASCULAR:  Examination of carotid arteries is normal; no carotid bruits  Regular rate and rhythm, no murmurs  Examination of peripheral vascular system by observation and palpation is normal  EYES:  Ophthalmoscopic exam of optic discs and posterior segments is normal; no papilledema or hemorrhages  MUSCULOSKELETAL:  Gait, strength, tone, movements noted in Neurologic exam below  NEUROLOGIC: MENTAL STATUS:  No flowsheet data found.  awake, alert, oriented to person, place and time  [facility] memory   DECR attention and concentration  language fluent, comprehension intact, naming intact,   fund of knowledge appropriate  CRANIAL NERVE:   2nd - no papilledema on fundoscopic exam  2nd, 3rd, 4th, 6th - pupils equal and reactive to light, visual fields full to confrontation, extraocular muscles intact, no nystagmus  5th - facial sensation symmetric  7th - facial strength symmetric  8th - hearing intact  9th - palate elevates symmetrically, uvula midline  11th - shoulder shrug symmetric  12th - tongue protrusion midline  MASKED FACIES  SOFT, HOARSE VOICE  MOTOR:   RESTING TREMOR IN RUE > LUE  INCREASED TONE (COGWHEELING RIGIDTY) IN RUE > LUE  BRADYKINESIA IN BUE AND BLE  normal bulk; full strength in the BUE, BLE EXCEPT LIMITED IN LEFT ARM DUE TO LEFT SHOULDER PAIN; BLE 4+  SENSORY:   normal and symmetric to light touch, temperature, vibration  COORDINATION:   finger-nose-finger, fine finger movements normal  REFLEXES:   deep tendon reflexes TRACE and symmetric  GAIT/STATION:   STOOPED POSTURE; SHUFFLING GAIT; EN BLOC TURNING; UNSTEADY    DIAGNOSTIC DATA (LABS, IMAGING, TESTING) - I reviewed patient  records, labs, notes, testing and imaging myself where available.  Lab Results  Component Value Date   WBC 9.9 08/20/2016   HGB 11.2 (L) 08/20/2016   HCT 33.0 (L) 08/20/2016   MCV 89.7 08/20/2016   PLT 132 (L) 08/20/2016      Component Value Date/Time   NA 140 08/20/2016 1345   K 3.8 08/20/2016 1345   CL 102 08/20/2016 1345   CO2 27 08/20/2016 1323   GLUCOSE 179 (H) 08/20/2016 1345   BUN 27 (H) 08/20/2016 1345   CREATININE 1.50 (H) 08/20/2016 1345   CREATININE 1.47 (H) 08/29/2015 0909   CALCIUM 8.9 08/20/2016 1323   PROT 6.3 (L) 08/20/2016 1323   ALBUMIN 3.3 (L) 08/20/2016 1323   AST 22 08/20/2016 1323   ALT 12 (L) 08/20/2016 1323   ALKPHOS 60 08/20/2016 1323   BILITOT 0.8 08/20/2016 1323   GFRNONAA 40 (L) 08/20/2016 1323  GFRAA 47 (L) 08/20/2016 1323   Lab Results  Component Value Date   CHOL 89 06/21/2016   HDL 30 (L) 06/21/2016   LDLCALC 45 06/21/2016   LDLDIRECT 99.1 09/18/2008   TRIG 68 06/21/2016   CHOLHDL 3.0 06/21/2016   Lab Results  Component Value Date   HGBA1C 5.8 (H) 06/21/2016   No results found for: [id number] Lab Results  Component Value Date   TSH 1.16 07/19/2015     06/20/16 MRI / MRA head [I reviewed images myself and agree with interpretation. Also with moderate-severe atrophy. -VRP]  1. Acute infarct of the right caudate tail, extending inferiorly along the posterior limb of the right internal capsule. This is in keeping with the reported left-sided weakness. 2. No acute hemorrhage or mass effect. 3. 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 4. Normal intracranial MRA. 5. Chronic microvascular ischemia and age advanced atrophy.  06/22/16 CT head  - Bilateral: intimal wall thickening CCA. Mild soft plaque origin ICA. 1-39% ICA plaquing. Vertebral artery flow is antegrade.  06/21/16 TTE - Pt bradycardic during the study with HR high 30s and low 40s. Hypokinesis of the  distal inferior wall with overall preserved LV systolic function; moderate LVH; grade 2 diastolic dysfunction; mildly dilated aortic root; mild TR with mildly elevated pulmonary pressure.     ASSESSMENT AND PLAN  76 y.o. 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  Also with 10-15 year history of gradual onset progressive resting tremor, slow shuffling gait, balance difficulty, most consistent with idiopathic Parkinson's disease.  Dx:  1. Cerebrovascular accident (CVA) due to thrombosis of right middle cerebral artery ([place])   2. Parkinson's disease ([place])      PLAN:  STROKE PREVENTION - continue aspirin 325mg  + clopidogrel 75mg  daily - continue crestor - monitor BP at home and follow up with PCP  PARKINSON'S DISEASE - trial of carb/levo 25/100 --> half tab three times a day x 2 weeks, then increase to 1 tab three times a day  - continue home health aids and therapy  Meds ordered this encounter  Medications  . carbidopa-levodopa (SINEMET IR) 25-100 MG tablet    Sig: Take 1 tablet by mouth 3 (three) times daily before meals.    Dispense:  90 tablet    Refill:  6   Return in about 3 months (around 02/20/2017).    Penni Bombard, MD 05/14/9700, 63:78 AM Certified in Neurology, Neurophysiology and Neuroimaging  [facility] [address] [place], [place] [address] [phone]

## 2016-11-24 DIAGNOSIS — R2689 Other abnormalities of gait and mobility: Secondary | ICD-10-CM | POA: Diagnosis not present

## 2016-11-24 DIAGNOSIS — M6281 Muscle weakness (generalized): Secondary | ICD-10-CM | POA: Diagnosis not present

## 2016-11-27 DIAGNOSIS — M25512 Pain in left shoulder: Secondary | ICD-10-CM | POA: Diagnosis not present

## 2016-11-27 DIAGNOSIS — M25612 Stiffness of left shoulder, not elsewhere classified: Secondary | ICD-10-CM | POA: Diagnosis not present

## 2016-11-28 DIAGNOSIS — R2689 Other abnormalities of gait and mobility: Secondary | ICD-10-CM | POA: Diagnosis not present

## 2016-11-28 DIAGNOSIS — M6281 Muscle weakness (generalized): Secondary | ICD-10-CM | POA: Diagnosis not present

## 2016-11-29 ENCOUNTER — Other Ambulatory Visit: Payer: Self-pay | Admitting: Internal Medicine

## 2016-12-01 DIAGNOSIS — M6281 Muscle weakness (generalized): Secondary | ICD-10-CM | POA: Diagnosis not present

## 2016-12-01 DIAGNOSIS — M25612 Stiffness of left shoulder, not elsewhere classified: Secondary | ICD-10-CM | POA: Diagnosis not present

## 2016-12-01 DIAGNOSIS — M25512 Pain in left shoulder: Secondary | ICD-10-CM | POA: Diagnosis not present

## 2016-12-01 DIAGNOSIS — R2689 Other abnormalities of gait and mobility: Secondary | ICD-10-CM | POA: Diagnosis not present

## 2016-12-03 DIAGNOSIS — M6281 Muscle weakness (generalized): Secondary | ICD-10-CM | POA: Diagnosis not present

## 2016-12-03 DIAGNOSIS — R2689 Other abnormalities of gait and mobility: Secondary | ICD-10-CM | POA: Diagnosis not present

## 2016-12-08 DIAGNOSIS — M25512 Pain in left shoulder: Secondary | ICD-10-CM | POA: Diagnosis not present

## 2016-12-08 DIAGNOSIS — M25612 Stiffness of left shoulder, not elsewhere classified: Secondary | ICD-10-CM | POA: Diagnosis not present

## 2016-12-10 DIAGNOSIS — M6281 Muscle weakness (generalized): Secondary | ICD-10-CM | POA: Diagnosis not present

## 2016-12-10 DIAGNOSIS — R2689 Other abnormalities of gait and mobility: Secondary | ICD-10-CM | POA: Diagnosis not present

## 2016-12-12 DIAGNOSIS — M25512 Pain in left shoulder: Secondary | ICD-10-CM | POA: Diagnosis not present

## 2016-12-12 DIAGNOSIS — R2689 Other abnormalities of gait and mobility: Secondary | ICD-10-CM | POA: Diagnosis not present

## 2016-12-12 DIAGNOSIS — M6281 Muscle weakness (generalized): Secondary | ICD-10-CM | POA: Diagnosis not present

## 2016-12-12 DIAGNOSIS — M25612 Stiffness of left shoulder, not elsewhere classified: Secondary | ICD-10-CM | POA: Diagnosis not present

## 2016-12-15 DIAGNOSIS — M6281 Muscle weakness (generalized): Secondary | ICD-10-CM | POA: Diagnosis not present

## 2016-12-15 DIAGNOSIS — M25512 Pain in left shoulder: Secondary | ICD-10-CM | POA: Diagnosis not present

## 2016-12-15 DIAGNOSIS — M25612 Stiffness of left shoulder, not elsewhere classified: Secondary | ICD-10-CM | POA: Diagnosis not present

## 2016-12-15 DIAGNOSIS — R2689 Other abnormalities of gait and mobility: Secondary | ICD-10-CM | POA: Diagnosis not present

## 2016-12-18 DIAGNOSIS — M6281 Muscle weakness (generalized): Secondary | ICD-10-CM | POA: Diagnosis not present

## 2016-12-18 DIAGNOSIS — R2689 Other abnormalities of gait and mobility: Secondary | ICD-10-CM | POA: Diagnosis not present

## 2016-12-23 DIAGNOSIS — M25612 Stiffness of left shoulder, not elsewhere classified: Secondary | ICD-10-CM | POA: Diagnosis not present

## 2016-12-23 DIAGNOSIS — M25512 Pain in left shoulder: Secondary | ICD-10-CM | POA: Diagnosis not present

## 2016-12-25 DIAGNOSIS — M6281 Muscle weakness (generalized): Secondary | ICD-10-CM | POA: Diagnosis not present

## 2016-12-25 DIAGNOSIS — R2689 Other abnormalities of gait and mobility: Secondary | ICD-10-CM | POA: Diagnosis not present

## 2016-12-31 DIAGNOSIS — M6281 Muscle weakness (generalized): Secondary | ICD-10-CM | POA: Diagnosis not present

## 2016-12-31 DIAGNOSIS — R2689 Other abnormalities of gait and mobility: Secondary | ICD-10-CM | POA: Diagnosis not present

## 2017-01-05 DIAGNOSIS — M6281 Muscle weakness (generalized): Secondary | ICD-10-CM | POA: Diagnosis not present

## 2017-01-05 DIAGNOSIS — R2689 Other abnormalities of gait and mobility: Secondary | ICD-10-CM | POA: Diagnosis not present

## 2017-01-08 DIAGNOSIS — M6281 Muscle weakness (generalized): Secondary | ICD-10-CM | POA: Diagnosis not present

## 2017-01-08 DIAGNOSIS — R2689 Other abnormalities of gait and mobility: Secondary | ICD-10-CM | POA: Diagnosis not present

## 2017-01-09 ENCOUNTER — Ambulatory Visit (INDEPENDENT_AMBULATORY_CARE_PROVIDER_SITE_OTHER): Payer: Medicare Other | Admitting: Family Medicine

## 2017-01-09 ENCOUNTER — Encounter: Payer: Self-pay | Admitting: Family Medicine

## 2017-01-09 VITALS — BP 150/90 | HR 45 | Resp 16 | Wt 149.8 lb

## 2017-01-09 DIAGNOSIS — E118 Type 2 diabetes mellitus with unspecified complications: Secondary | ICD-10-CM

## 2017-01-09 DIAGNOSIS — I2510 Atherosclerotic heart disease of native coronary artery without angina pectoris: Secondary | ICD-10-CM

## 2017-01-09 DIAGNOSIS — R269 Unspecified abnormalities of gait and mobility: Secondary | ICD-10-CM

## 2017-01-09 DIAGNOSIS — I639 Cerebral infarction, unspecified: Secondary | ICD-10-CM | POA: Diagnosis not present

## 2017-01-09 DIAGNOSIS — H9113 Presbycusis, bilateral: Secondary | ICD-10-CM | POA: Diagnosis not present

## 2017-01-09 DIAGNOSIS — G20 Parkinson's disease: Secondary | ICD-10-CM | POA: Diagnosis not present

## 2017-01-09 DIAGNOSIS — Z23 Encounter for immunization: Secondary | ICD-10-CM | POA: Diagnosis not present

## 2017-01-09 DIAGNOSIS — I10 Essential (primary) hypertension: Secondary | ICD-10-CM

## 2017-01-09 DIAGNOSIS — R001 Bradycardia, unspecified: Secondary | ICD-10-CM | POA: Diagnosis not present

## 2017-01-09 DIAGNOSIS — I69398 Other sequelae of cerebral infarction: Secondary | ICD-10-CM

## 2017-01-09 DIAGNOSIS — I152 Hypertension secondary to endocrine disorders: Secondary | ICD-10-CM

## 2017-01-09 DIAGNOSIS — E1159 Type 2 diabetes mellitus with other circulatory complications: Secondary | ICD-10-CM

## 2017-01-09 DIAGNOSIS — I618 Other nontraumatic intracerebral hemorrhage: Secondary | ICD-10-CM | POA: Diagnosis not present

## 2017-01-09 DIAGNOSIS — I6381 Other cerebral infarction due to occlusion or stenosis of small artery: Secondary | ICD-10-CM

## 2017-01-09 LAB — POCT UA - MICROALBUMIN
Albumin/Creatinine Ratio, Urine, POC: 7.4
Creatinine, POC: 192.7 mg/dL
MICROALBUMIN (UR) POC: 14.3 mg/L

## 2017-01-09 LAB — POCT GLYCOSYLATED HEMOGLOBIN (HGB A1C): Hemoglobin A1C: 6

## 2017-01-09 MED ORDER — LISINOPRIL 10 MG PO TABS: 10.0000 mg | ORAL_TABLET | Freq: Every day | ORAL | 3 refills | Status: DC

## 2017-01-09 NOTE — Progress Notes (Signed)
   Subjective:    Patient ID: Donald Richardson, male    DOB: 04-08-1940, 76 y.o.   MRN: [id number]  HPI He is here for follow-up visit. He does have underlying diabetes however over the last several years, his A1c numbers of boys been in the 6 range. He did have a high of 6.7. Presently he is on no diabetes medicines. He continues on Plavix and aspirin for treatment of underlying CVA he has a follow-up appointment with neurology. He is also seeing neurology for his underlying Parkinson's. His present dosing regimen seems to be working according to his brother. He apparently has walking better and having less tremor. Presently he has a caregiver 8 hours per day 7 days per week which according to his brother this seems to be working well. He continues on Crestor. He offers no particular complaints. He is wearing his hearing aids. He does not complain of chest pain, shortness of breath, PND or DOE   Review of Systems     Objective:   Physical Exam Alert and in no distress. Hemoglobin A1c is 6.0.       Assessment & Plan:  Controlled type 2 diabetes mellitus with complication, without long-term current use of insulin ([place]) - Plan: HgB A1c, POCT UA - Microalbumin  Need for influenza vaccination - Plan: Flu vaccine HIGH DOSE PF (Fluzone High dose)  Need for vaccination against Streptococcus pneumoniae - Plan: Pneumococcal conjugate vaccine 13-valent  Hypertension associated with diabetes ([place]) - Plan: lisinopril (PRINIVIL,ZESTRIL) 10 MG tablet  Gait disturbance, post-stroke  Basal ganglia infarction (HCC)  Bradycardia  Atherosclerosis of native coronary artery of native heart without angina pectoris  Other nontraumatic intracerebral hemorrhage, unspecified laterality (HCC)  Parkinson's disease ([place]) I explained that even though he is not taking any diabetes related medications, he did have an A1c of 6.73 years ago. We will continue to monitor this. His immunizations will be updated. His  blood pressure has been slightly elevated and I will therefore add lisinopril back to his regimen. Handicap placard filled out. Weak check here in roughly 6 months but need to call in the next month or 2 concerning his blood pressure.

## 2017-01-14 DIAGNOSIS — M6281 Muscle weakness (generalized): Secondary | ICD-10-CM | POA: Diagnosis not present

## 2017-01-14 DIAGNOSIS — R2689 Other abnormalities of gait and mobility: Secondary | ICD-10-CM | POA: Diagnosis not present

## 2017-01-19 DIAGNOSIS — M6281 Muscle weakness (generalized): Secondary | ICD-10-CM | POA: Diagnosis not present

## 2017-01-19 DIAGNOSIS — R2689 Other abnormalities of gait and mobility: Secondary | ICD-10-CM | POA: Diagnosis not present

## 2017-01-20 DIAGNOSIS — R2689 Other abnormalities of gait and mobility: Secondary | ICD-10-CM | POA: Diagnosis not present

## 2017-01-20 DIAGNOSIS — M6281 Muscle weakness (generalized): Secondary | ICD-10-CM | POA: Diagnosis not present

## 2017-01-26 DIAGNOSIS — M6281 Muscle weakness (generalized): Secondary | ICD-10-CM | POA: Diagnosis not present

## 2017-01-26 DIAGNOSIS — R2689 Other abnormalities of gait and mobility: Secondary | ICD-10-CM | POA: Diagnosis not present

## 2017-01-28 DIAGNOSIS — M6281 Muscle weakness (generalized): Secondary | ICD-10-CM | POA: Diagnosis not present

## 2017-01-28 DIAGNOSIS — R2689 Other abnormalities of gait and mobility: Secondary | ICD-10-CM | POA: Diagnosis not present

## 2017-02-02 DIAGNOSIS — M6281 Muscle weakness (generalized): Secondary | ICD-10-CM | POA: Diagnosis not present

## 2017-02-02 DIAGNOSIS — R2689 Other abnormalities of gait and mobility: Secondary | ICD-10-CM | POA: Diagnosis not present

## 2017-02-06 ENCOUNTER — Ambulatory Visit (INDEPENDENT_AMBULATORY_CARE_PROVIDER_SITE_OTHER): Payer: Medicare Other | Admitting: Family Medicine

## 2017-02-06 ENCOUNTER — Encounter: Payer: Self-pay | Admitting: Family Medicine

## 2017-02-06 VITALS — BP 148/82 | HR 86 | Ht 68.0 in | Wt 150.0 lb

## 2017-02-06 DIAGNOSIS — G20 Parkinson's disease: Secondary | ICD-10-CM

## 2017-02-06 DIAGNOSIS — E118 Type 2 diabetes mellitus with unspecified complications: Secondary | ICD-10-CM | POA: Diagnosis not present

## 2017-02-06 DIAGNOSIS — I639 Cerebral infarction, unspecified: Secondary | ICD-10-CM | POA: Diagnosis not present

## 2017-02-06 DIAGNOSIS — I69398 Other sequelae of cerebral infarction: Secondary | ICD-10-CM | POA: Diagnosis not present

## 2017-02-06 DIAGNOSIS — I6381 Other cerebral infarction due to occlusion or stenosis of small artery: Secondary | ICD-10-CM

## 2017-02-06 DIAGNOSIS — R269 Unspecified abnormalities of gait and mobility: Secondary | ICD-10-CM

## 2017-02-06 NOTE — Progress Notes (Signed)
   Subjective:    Patient ID: Donald Richardson, male    DOB: [birth year], 76 y.o.   MRN: [id number]  HPI He is here with his brother who is now his legal power of attorney.  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  His medical diagnoses was reviewed and include those listed below plus several more.  He does have difficulty having to use a walker because he is a fall risk.  He needs assistance getting in and out of the tub and has difficulty cleaning himself due to inability to fully abduct his left arm.  He gets easily confused and therefore needs help with his medications.   Review of Systems     Objective:   Physical Exam Alert and in no distress otherwise not examined       Assessment & Plan:  Controlled type 2 diabetes mellitus with complication, without long-term current use of insulin (HCC)  Basal ganglia infarction (HCC)  Parkinson's disease (HCC)  Gait disturbance, post-stroke  The paperwork was filled out and filed in the chart.

## 2017-02-20 ENCOUNTER — Telehealth: Payer: Self-pay | Admitting: Family Medicine

## 2017-02-20 ENCOUNTER — Ambulatory Visit (INDEPENDENT_AMBULATORY_CARE_PROVIDER_SITE_OTHER): Payer: Medicare Other | Admitting: Diagnostic Neuroimaging

## 2017-02-20 ENCOUNTER — Encounter: Payer: Self-pay | Admitting: Diagnostic Neuroimaging

## 2017-02-20 VITALS — BP 150/85 | HR 49 | Ht 68.0 in | Wt 153.0 lb

## 2017-02-20 DIAGNOSIS — G20 Parkinson's disease: Secondary | ICD-10-CM

## 2017-02-20 DIAGNOSIS — I639 Cerebral infarction, unspecified: Secondary | ICD-10-CM | POA: Diagnosis not present

## 2017-02-20 DIAGNOSIS — I63311 Cerebral infarction due to thrombosis of right middle cerebral artery: Secondary | ICD-10-CM

## 2017-02-20 MED ORDER — CARBIDOPA-LEVODOPA 25-100 MG PO TABS: 1.0000 | ORAL_TABLET | Freq: Three times a day (TID) | ORAL | 12 refills | Status: DC

## 2017-02-20 NOTE — Telephone Encounter (Signed)
Completed and pt's brother picked up

## 2017-02-20 NOTE — Telephone Encounter (Signed)
Pt's brother came in and dropped off a from to be completed. It is so trash can be picked up at his house instead of having to take to the road. I am sending back to be completed. Please call brother, [name] at [phone] when complete.

## 2017-02-20 NOTE — Progress Notes (Signed)
[facility]  PATIENT: Donald Richardson DOB: 01-05-1941  REFERRING CLINICIAN: [employer], J HISTORY FROM: patient  REASON FOR VISIT: follow up   HISTORICAL  CHIEF COMPLAINT:  Chief Complaint  Patient presents with  . Follow-up  . Parkinson's Disease    Sinemet has helped some with hand/arm tremors.  No change with mobility.  Uses walker at home.  WC when transporting.  No Falls/   . CVA Hx    HISTORY OF PRESENT ILLNESS:   UPDATE (02/20/17, VRP): Since last visit, doing better with tremor. Tolerating carb/levo --> helping with tremor. Gait is still poor. No alleviating or aggravating factors. Not driving much anymore.   PRIOR HPI (11/21/16) 76 year old male here for evaluation of stroke hospital discharge. Also requested for evaluation of possible Parkinson's disease. 06/20/16 patient was admitted to the hospital due to gait and balance difficulties, multiple falls. Symptoms lasted for 2 days before patient went to the hospital. He was diagnosed with right brain subcortical ischemic infarction. Stroke workup was completed. He was exposed to follow-up in neurology clinic earlier after his discharge. Since that time patient had further balance and gait difficulty, with a severe fall in June resulting in several fractures. Now patient having significant difficulty with his activities of daily living. He lives alone but has a caregiver come every day for 8 hours. His brother lives in [place] but visits him once a week. Also of note patient has had at least 10-15 year history of progressive resting tremor, stooped posture, shuffling gait, according to patient's brother. Patient feels like he has had right hand tremor for almost 30 years. Patient's father had Parkinson's disease.    REVIEW OF SYSTEMS: Full 14 system review of systems performed and negative with exception of: drooling hearing loss weakness tremors.    ALLERGIES: No Known Allergies  HOME  MEDICATIONS: Outpatient Medications Prior to Visit  Medication Sig Dispense Refill  . acetaminophen (TYLENOL) 500 MG tablet Take 1,000 mg by mouth every 6 (six) hours as needed for mild pain.    Marland Kitchen aspirin EC 325 MG EC tablet Take 1 tablet (325 mg total) by mouth daily. 30 tablet 0  . carbidopa-levodopa (SINEMET IR) 25-100 MG tablet Take 1 tablet by mouth 3 (three) times daily before meals. 90 tablet 6  . clopidogrel (PLAVIX) 75 MG tablet TAKE 1 TABLET BY MOUTH EVERY DAY. 15 tablet 0  . lisinopril (PRINIVIL,ZESTRIL) 10 MG tablet Take 1 tablet (10 mg total) daily by mouth. 90 tablet 3  . rosuvastatin (CRESTOR) 20 MG tablet Take 1 tablet (20 mg total) by mouth at bedtime. 30 tablet 0   No facility-administered medications prior to visit.     PAST MEDICAL HISTORY: Past Medical History:  Diagnosis Date  . ASHD (arteriosclerotic heart disease)    STENT  . COPD (chronic obstructive pulmonary disease) ([place])   . Diabetes mellitus    Hgb A1C- 5.8 in May 2017  . Hemorrhoids   . Hyperlipidemia   . Hypertension   . Smoker     PAST SURGICAL HISTORY: Past Surgical History:  Procedure Laterality Date  . INGUINAL HERNIA REPAIR Left 10/26/2015   Procedure: LEFT INGUINAL HERNIA REPAIR WITH MESH;  Surgeon: Jackolyn Confer, MD;  Location: [facility];  Service: General;  Laterality: Left;  . INSERTION OF MESH Left 10/26/2015   Procedure: INSERTION OF MESH;  Surgeon: Jackolyn Confer, MD;  Location: [facility];  Service: General;  Laterality: Left;  . NO PAST SURGERIES  FAMILY HISTORY: Family History  Problem Relation Age of Onset  . Breast cancer Mother   . Parkinson's disease Father     SOCIAL HISTORY:  Social History   Socioeconomic History  . Marital status: Single    Spouse name: Not on file  . Number of children: Not on file  . Years of education: Not on file  . Highest education level: Not on file  Social Needs  . Financial resource strain: Not on file  . Food insecurity - worry: Not  on file  . Food insecurity - inability: Not on file  . Transportation needs - medical: Not on file  . Transportation needs - non-medical: Not on file  Occupational History  . Not on file  Tobacco Use  . Smoking status: Former Smoker    Packs/day: 1.00    Types: Cigarettes    Last attempt to quit: 06/20/2016    Years since quitting: 0.6  . Smokeless tobacco: Never Used  Substance and Sexual Activity  . Alcohol use: No  . Drug use: No  . Sexual activity: Not Currently  Other Topics Concern  . Not on file  Social History Narrative  . Not on file     PHYSICAL EXAM  GENERAL EXAM/CONSTITUTIONAL: Vitals:  Vitals:   02/20/17 1051  BP: (!) 150/85  Pulse: (!) 49  Weight: 153 lb (69.4 kg)  Height: 5\' 8"  (1.727 m)   Body mass index is 23.26 kg/m. No exam data present  Patient is in no distress; well developed, nourished and groomed; neck is supple  CARDIOVASCULAR:  Examination of carotid arteries is normal; no carotid bruits  Regular rate and rhythm, no murmurs  Examination of peripheral vascular system by observation and palpation is normal  EYES:  Ophthalmoscopic exam of optic discs and posterior segments is normal; no papilledema or hemorrhages  MUSCULOSKELETAL:  Gait, strength, tone, movements noted in Neurologic exam below  NEUROLOGIC: MENTAL STATUS:  No flowsheet data found.  awake, alert, oriented to person, place and time  [facility] memory   DECR attention and concentration  language fluent, comprehension intact, naming intact,   fund of knowledge appropriate  CRANIAL NERVE:   2nd - no papilledema on fundoscopic exam  2nd, 3rd, 4th, 6th - pupils equal and reactive to light, visual fields full to confrontation, extraocular muscles intact, no nystagmus  5th - facial sensation symmetric  7th - facial strength symmetric  8th - hearing intact  9th - palate elevates symmetrically, uvula midline  11th - shoulder shrug symmetric  12th - tongue  protrusion midline  MASKED FACIES  SOFT, HOARSE VOICE  MOTOR:   RESTING TREMOR IN RUE > LUE  INCREASED TONE (COGWHEELING RIGIDTY) IN LUE > RUE  MILD BRADYKINESIA IN BUE AND BLE  normal bulk; full strength in the BUE, BLE EXCEPT LIMITED IN LEFT ARM DUE TO LEFT SHOULDER PAIN; BLE 4+  SENSORY:   normal and symmetric to light touch, temperature, vibration  COORDINATION:   finger-nose-finger, fine finger movements normal  REFLEXES:   deep tendon reflexes TRACE and symmetric  GAIT/STATION:   STOOPED POSTURE; SHUFFLING GAIT; EN BLOC TURNING; UNSTEADY; USING WALKER DOES BETTER    DIAGNOSTIC DATA (LABS, IMAGING, TESTING) - I reviewed patient records, labs, notes, testing and imaging myself where available.  Lab Results  Component Value Date   WBC 9.9 08/20/2016   HGB 11.2 (L) 08/20/2016   HCT 33.0 (L) 08/20/2016   MCV 89.7 08/20/2016   PLT 132 (L) 08/20/2016  Component Value Date/Time   NA 140 08/20/2016 1345   K 3.8 08/20/2016 1345   CL 102 08/20/2016 1345   CO2 27 08/20/2016 1323   GLUCOSE 179 (H) 08/20/2016 1345   BUN 27 (H) 08/20/2016 1345   CREATININE 1.50 (H) 08/20/2016 1345   CREATININE 1.47 (H) 08/29/2015 0909   CALCIUM 8.9 08/20/2016 1323   PROT 6.3 (L) 08/20/2016 1323   ALBUMIN 3.3 (L) 08/20/2016 1323   AST 22 08/20/2016 1323   ALT 12 (L) 08/20/2016 1323   ALKPHOS 60 08/20/2016 1323   BILITOT 0.8 08/20/2016 1323   GFRNONAA 40 (L) 08/20/2016 1323   GFRAA 47 (L) 08/20/2016 1323   Lab Results  Component Value Date   CHOL 89 06/21/2016   HDL 30 (L) 06/21/2016   LDLCALC 45 06/21/2016   LDLDIRECT 99.1 09/18/2008   TRIG 68 06/21/2016   CHOLHDL 3.0 06/21/2016   Lab Results  Component Value Date   HGBA1C 6.0 01/09/2017   No results found for: VITAMINB12 Lab Results  Component Value Date   TSH 1.16 07/19/2015     06/20/16 MRI / MRA head [I reviewed images myself and agree with interpretation. Also with moderate-severe atrophy. -VRP]  1.  Acute infarct of the right caudate tail, extending inferiorly along the posterior limb of the right internal capsule. This is in keeping with the reported left-sided weakness. 2. No acute hemorrhage or mass effect. 3. 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 4. Normal intracranial MRA. 5. Chronic microvascular ischemia and age advanced atrophy.  06/22/16 CT head  - Bilateral: intimal wall thickening CCA. Mild soft plaque origin ICA. 1-39% ICA plaquing. Vertebral artery flow is antegrade.  06/21/16 TTE - Pt bradycardic during the study with HR high 30s and low 40s. Hypokinesis of the distal inferior wall with overall preserved LV systolic function; moderate LVH; grade 2 diastolic dysfunction; mildly dilated aortic root; mild TR with mildly elevated pulmonary pressure.     ASSESSMENT AND PLAN  76 y.o. 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  Also with 10-15 year history of gradual onset progressive resting tremor, slow shuffling gait, balance difficulty, most consistent with idiopathic Parkinson's disease.  Dx:  1. Parkinson's disease ([place])   2. Cerebrovascular accident (CVA) due to thrombosis of right middle cerebral artery (HCC)      PLAN:  STROKE PREVENTION - continue aspirin 325mg  + clopidogrel 75mg  daily - continue crestor - monitor BP at home and follow up with PCP  PARKINSON'S DISEASE - continue carb/levo 25/100 --> 1 tab three times a day  - continue home health aids and therapy  Meds ordered this encounter  Medications  . carbidopa-levodopa (SINEMET IR) 25-100 MG tablet    Sig: Take 1 tablet by mouth 3 (three) times daily before meals.    Dispense:  90 tablet    Refill:  12   Return in about 6 months (around 08/21/2017).    Penni Bombard, MD 08/31/1599, 09:32 AM Certified in  Neurology, Neurophysiology and Neuroimaging  [facility] [address], [place], [place] [address] [phone]

## 2017-02-20 NOTE — Patient Instructions (Signed)
-   continue current medications  - no driving

## 2017-07-10 ENCOUNTER — Ambulatory Visit (INDEPENDENT_AMBULATORY_CARE_PROVIDER_SITE_OTHER): Payer: Medicare Other | Admitting: Family Medicine

## 2017-07-10 ENCOUNTER — Encounter: Payer: Self-pay | Admitting: Family Medicine

## 2017-07-10 VITALS — BP 130/72 | HR 53 | Temp 98.0°F | Ht 66.0 in | Wt 154.8 lb

## 2017-07-10 DIAGNOSIS — B351 Tinea unguium: Secondary | ICD-10-CM | POA: Diagnosis not present

## 2017-07-10 DIAGNOSIS — I152 Hypertension secondary to endocrine disorders: Secondary | ICD-10-CM

## 2017-07-10 DIAGNOSIS — I69398 Other sequelae of cerebral infarction: Secondary | ICD-10-CM | POA: Diagnosis not present

## 2017-07-10 DIAGNOSIS — I10 Essential (primary) hypertension: Secondary | ICD-10-CM | POA: Diagnosis not present

## 2017-07-10 DIAGNOSIS — E118 Type 2 diabetes mellitus with unspecified complications: Secondary | ICD-10-CM

## 2017-07-10 DIAGNOSIS — I639 Cerebral infarction, unspecified: Secondary | ICD-10-CM

## 2017-07-10 DIAGNOSIS — E1169 Type 2 diabetes mellitus with other specified complication: Secondary | ICD-10-CM | POA: Diagnosis not present

## 2017-07-10 DIAGNOSIS — G20 Parkinson's disease: Secondary | ICD-10-CM | POA: Diagnosis not present

## 2017-07-10 DIAGNOSIS — E1159 Type 2 diabetes mellitus with other circulatory complications: Secondary | ICD-10-CM

## 2017-07-10 DIAGNOSIS — I6381 Other cerebral infarction due to occlusion or stenosis of small artery: Secondary | ICD-10-CM

## 2017-07-10 DIAGNOSIS — E785 Hyperlipidemia, unspecified: Secondary | ICD-10-CM | POA: Diagnosis not present

## 2017-07-10 DIAGNOSIS — R269 Unspecified abnormalities of gait and mobility: Secondary | ICD-10-CM

## 2017-07-10 LAB — POCT GLYCOSYLATED HEMOGLOBIN (HGB A1C): HEMOGLOBIN A1C: 6.3

## 2017-07-10 LAB — POCT UA - MICROALBUMIN
ALBUMIN/CREATININE RATIO, URINE, POC: 6.1
CREATININE, POC: 241.4 mg/dL
Microalbumin Ur, POC: 14.8 mg/L

## 2017-07-10 NOTE — Progress Notes (Signed)
Subjective:    Patient ID: Donald Richardson, male    DOB: Oct 27, 1940, 77 y.o.   MRN: [id number]  Donald Richardson is a 77 y.o. male who presents for follow-up of Type 2 diabetes mellitus.  Patient not checking home blood sugars.   Home blood sugar records:no record How often is blood sugars being checked: no meter  Current symptoms/problems include none and have been unchanged. Daily foot checks: yes  Any foot concerns: no Last eye exam: no has not  Exercise: walking around  He continues on lisinopril as well as Plavix and Crestor.  He did not bring his medications with him.  He also is on carbidopa/levodopa.  He does follow-up regularly with cardiologist.  He is also seen neurology in regard to his Parkinson's as well as CVA.  Recently he did take his driver's test and flunked at 3 times.  He did quit smoking after his CVA.  The following portions of the patient's history were reviewed and updated as appropriate: allergies, current medications, past medical history, past social history and problem list.  ROS as in subjective above.     Objective:    Physical Exam Alert and in no distress cardiac exam shows regular rhythm without murmurs or gallops lungs are clear to auscultation.  Foot exam is recorded and normal except for evidence of onychomycosis. Blood pressure 130/72, pulse (!) 53, temperature 98 F (36.7 C), height 5\' 6"  (1.676 m), weight 154 lb 12.8 oz (70.2 kg), SpO2 92 %.  Lab Review Diabetic Labs Latest Ref Rng & Units 01/09/2017 08/20/2016 08/20/2016 07/09/2016 07/08/2016  HbA1c - 6.0 - - - -  Microalbumin mg/L 14.3 - - - -  Micro/Creat Ratio - 7.4 - - - -  Chol 0 - 200 mg/dL - - - - -  HDL >40 mg/dL - - - - -  Calc LDL 0 - 99 mg/dL - - - - -  Triglycerides <150 mg/dL - - - - -  Creatinine 0.61 - 1.24 mg/dL - 1.50(H) 1.62(H) 1.33(H) 1.36(H)  GFR >60.00 mL/min - - - - -   BP/Weight 07/10/2017 02/20/2017 02/06/2017 01/09/2017 0/86/7619  Systolic BP 509 326 712 458 099  Diastolic BP  72 85 82 90 78  Wt. (Lbs) 154.8 153 150 149.8 145.6  BMI 24.99 23.26 22.81 22.78 22.14   Foot/eye exam completion dates Latest Ref Rng & Units 08/17/2015 08/16/2014  Eye Exam No Retinopathy No Retinopathy No Retinopathy  Foot Form Completion - - -  A1c is 6.3  Donald Richardson  reports that he quit smoking about 12 months ago. His smoking use included cigarettes. He smoked 1.00 pack per day. He has never used smokeless tobacco. He reports that he does not drink alcohol or use drugs.     Assessment & Plan:    Controlled type 2 diabetes mellitus with complication, without long-term current use of insulin ([place]) - Plan: CBC with Differential/Platelet, Comprehensive metabolic panel, Lipid panel, POCT UA - Microalbumin, POCT glycosylated hemoglobin (Hb A1C)  Basal ganglia infarction (HCC)  Parkinson's disease (HCC)  Gait disturbance, post-stroke  Hypertension associated with diabetes ([place]) - Plan: CBC with Differential/Platelet, Comprehensive metabolic panel  Hyperlipidemia associated with type 2 diabetes mellitus ([place]) - Plan: Lipid panel  Onychomycosis  1. Rx changes: none 2. Education: Reviewed 'ABCs' of diabetes management (respective goals in parentheses):  A1C (<7), blood pressure (<130/80), and cholesterol (LDL <100). 3. Compliance at present is estimated to be fair. Efforts to improve compliance (if necessary) will be  directed at increased exercise. 4. Follow up: 4 months I discussed the treatment of onychomycosis which is long-term.  He opted to not pursue that. I then discussed the fact that he flunked his driver's test 3 times.  I told him that I was very comfortable with him not driving and reluctant to sign any paperwork.  Discussed the fact that if neurologist signed off on he was still need to take the test again.

## 2017-07-11 LAB — COMPREHENSIVE METABOLIC PANEL
ALBUMIN: 4.1 g/dL (ref 3.5–4.8)
ALK PHOS: 82 IU/L (ref 39–117)
ALT: 7 IU/L (ref 0–44)
AST: 11 IU/L (ref 0–40)
Albumin/Globulin Ratio: 1.5 (ref 1.2–2.2)
BILIRUBIN TOTAL: 0.5 mg/dL (ref 0.0–1.2)
BUN / CREAT RATIO: 9 — AB (ref 10–24)
BUN: 15 mg/dL (ref 8–27)
CHLORIDE: 100 mmol/L (ref 96–106)
CO2: 24 mmol/L (ref 20–29)
Calcium: 9 mg/dL (ref 8.6–10.2)
Creatinine, Ser: 1.72 mg/dL — ABNORMAL HIGH (ref 0.76–1.27)
GFR calc Af Amer: 44 mL/min/{1.73_m2} — ABNORMAL LOW (ref >59)
GFR calc non Af Amer: 38 mL/min/{1.73_m2} — ABNORMAL LOW (ref >59)
Globulin, Total: 2.8 g/dL (ref 1.5–4.5)
Glucose: 100 mg/dL — ABNORMAL HIGH (ref 65–99)
POTASSIUM: 4.5 mmol/L (ref 3.5–5.2)
Sodium: 140 mmol/L (ref 134–144)
Total Protein: 6.9 g/dL (ref 6.0–8.5)

## 2017-07-11 LAB — LIPID PANEL
CHOLESTEROL TOTAL: 114 mg/dL (ref 100–199)
Chol/HDL Ratio: 3.2 ratio (ref 0.0–5.0)
HDL: 36 mg/dL — ABNORMAL LOW (ref >39)
LDL Calculated: 46 mg/dL (ref 0–99)
Triglycerides: 158 mg/dL — ABNORMAL HIGH (ref 0–149)
VLDL Cholesterol Cal: 32 mg/dL (ref 5–40)

## 2017-07-11 LAB — CBC WITH DIFFERENTIAL/PLATELET
Basophils Absolute: 0 10*3/uL (ref 0.0–0.2)
Basos: 0 %
EOS (ABSOLUTE): 0.1 10*3/uL (ref 0.0–0.4)
Eos: 2 %
Hematocrit: 43.2 % (ref 37.5–51.0)
Hemoglobin: 14.5 g/dL (ref 13.0–17.7)
Immature Grans (Abs): 0 10*3/uL (ref 0.0–0.1)
Immature Granulocytes: 0 %
LYMPHS ABS: 0.9 10*3/uL (ref 0.7–3.1)
Lymphs: 11 %
MCH: 29.2 pg (ref 26.6–33.0)
MCHC: 33.6 g/dL (ref 31.5–35.7)
MCV: 87 fL (ref 79–97)
Monocytes Absolute: 0.8 10*3/uL (ref 0.1–0.9)
Monocytes: 10 %
NEUTROS ABS: 6.3 10*3/uL (ref 1.4–7.0)
Neutrophils: 77 %
PLATELETS: 144 10*3/uL — AB (ref 150–379)
RBC: 4.97 x10E6/uL (ref 4.14–5.80)
RDW: 14.8 % (ref 12.3–15.4)
WBC: 8.1 10*3/uL (ref 3.4–10.8)

## 2017-07-22 IMAGING — DX DG CHEST 2V
2 series · 2 of 2 positions shown · non-contrast
Comparison: Chest x-ray earlier today and 06/21/2016

CLINICAL DATA: Fall 2 days ago.

EXAM:
CHEST  2 VIEW

[w chest pa]
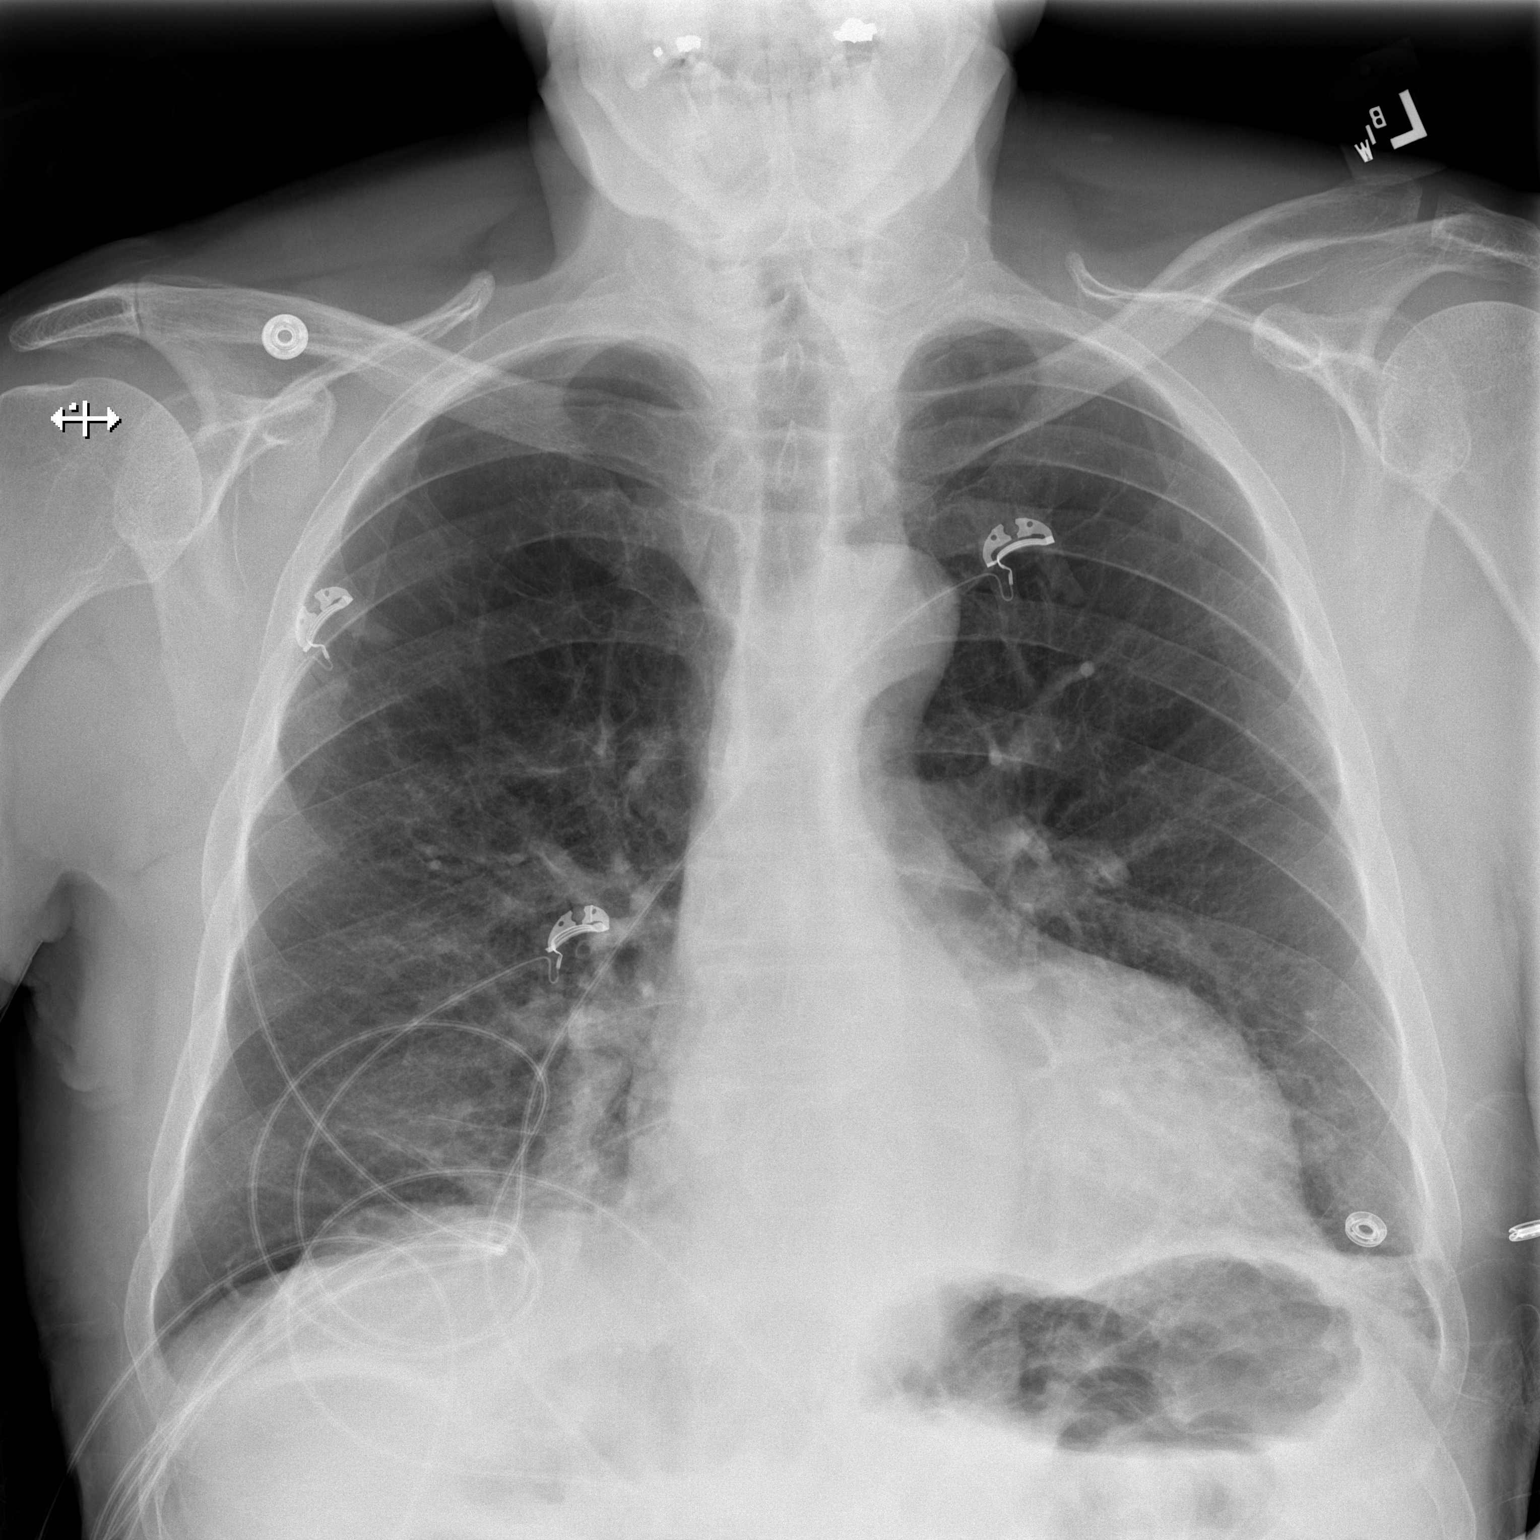

[w chest lat]
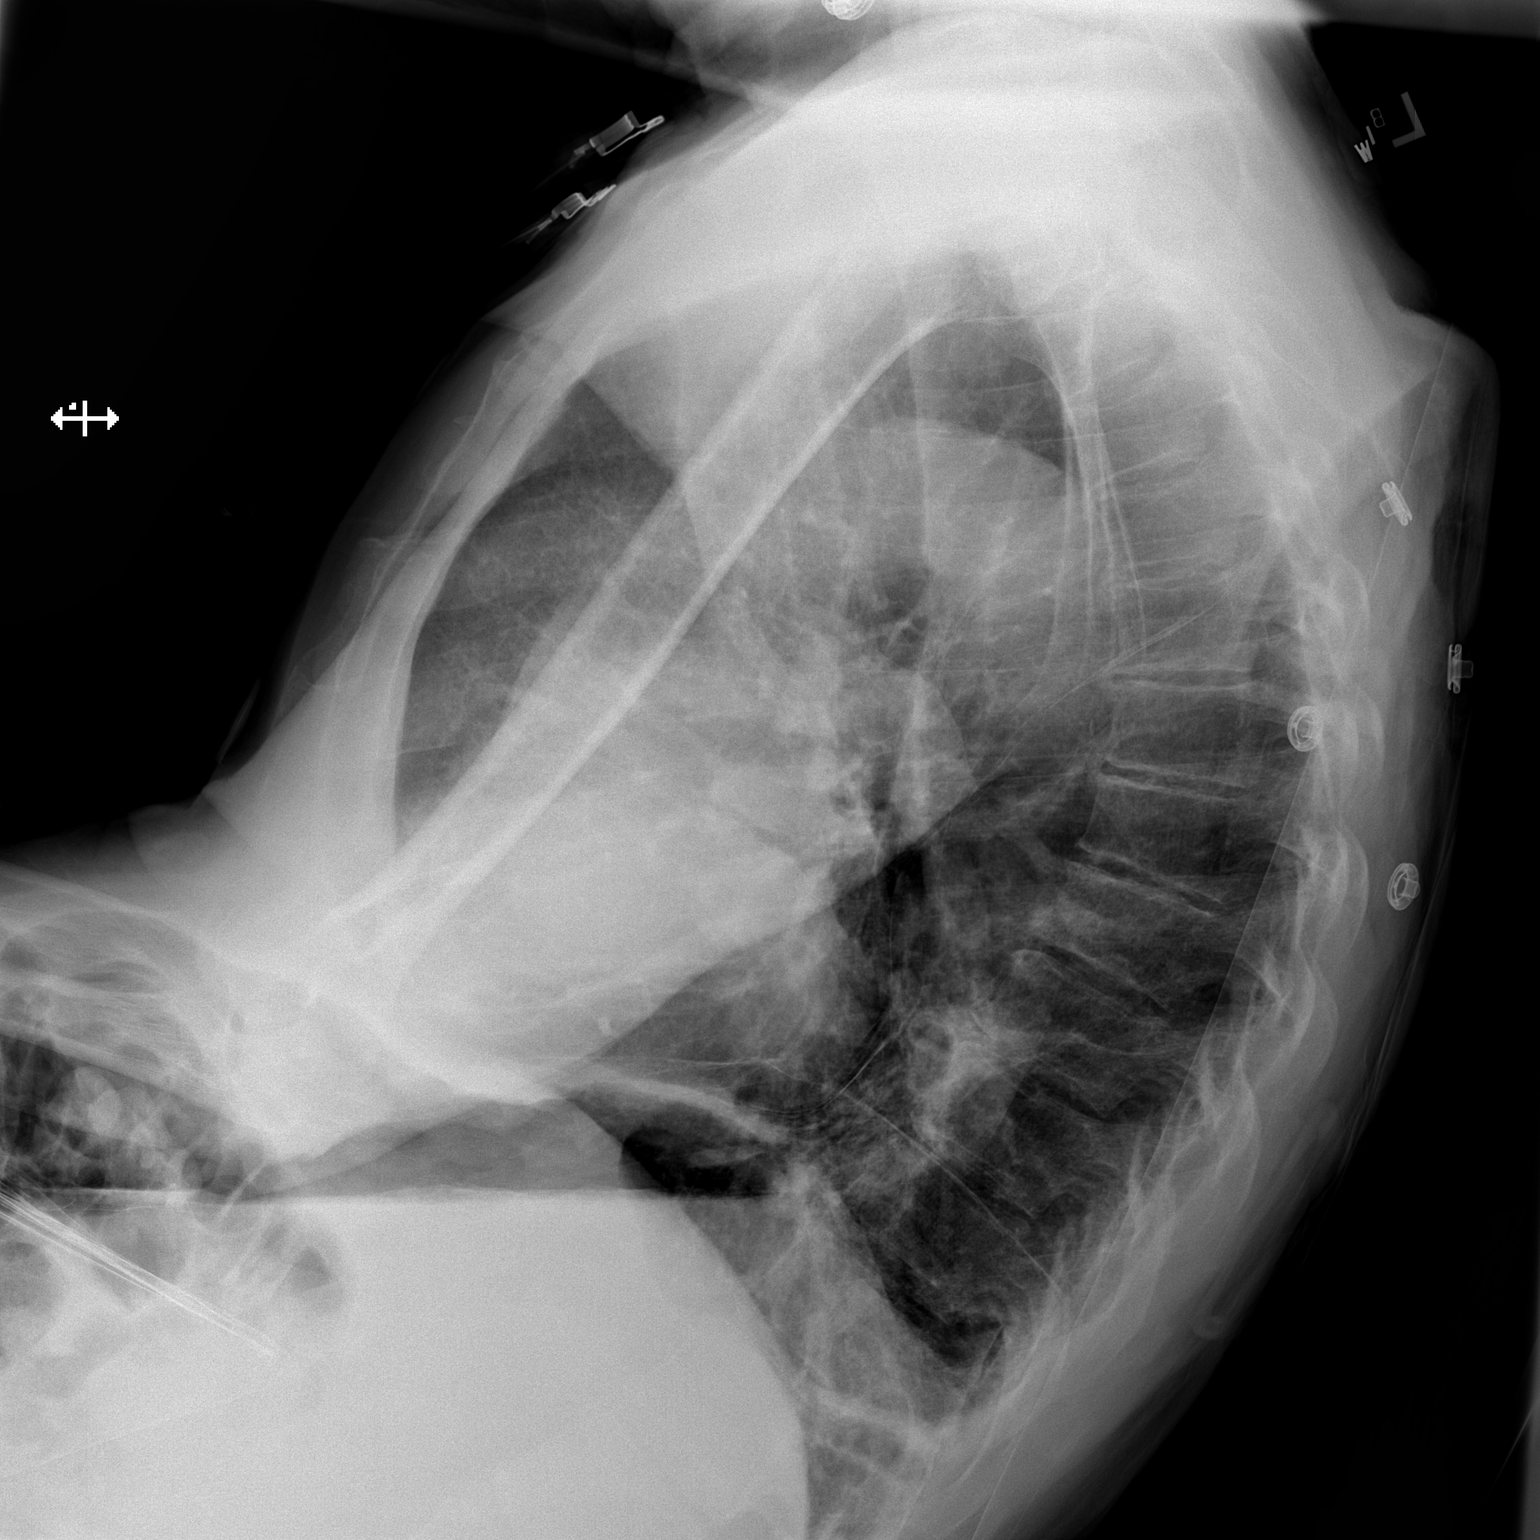

[2 of 2 positions shown; findings below may reference images not displayed]

FINDINGS: Cardiomegaly. No confluent opacity on the right. There is
predominantly linear opacity at the left base, likely atelectasis.
No effusions. No acute bony abnormality. No visible rib fracture. No
pneumothorax.
IMPRESSION: Cardiomegaly.  Left base atelectasis.

## 2017-07-22 IMAGING — DX DG FEMUR 2+V*L*
4 series · 4 of 4 positions shown · non-contrast
Comparison: None.

CLINICAL DATA: Fall 3 days ago

EXAM:
LEFT FEMUR 2 VIEWS

[femur ap (1 of 2)]
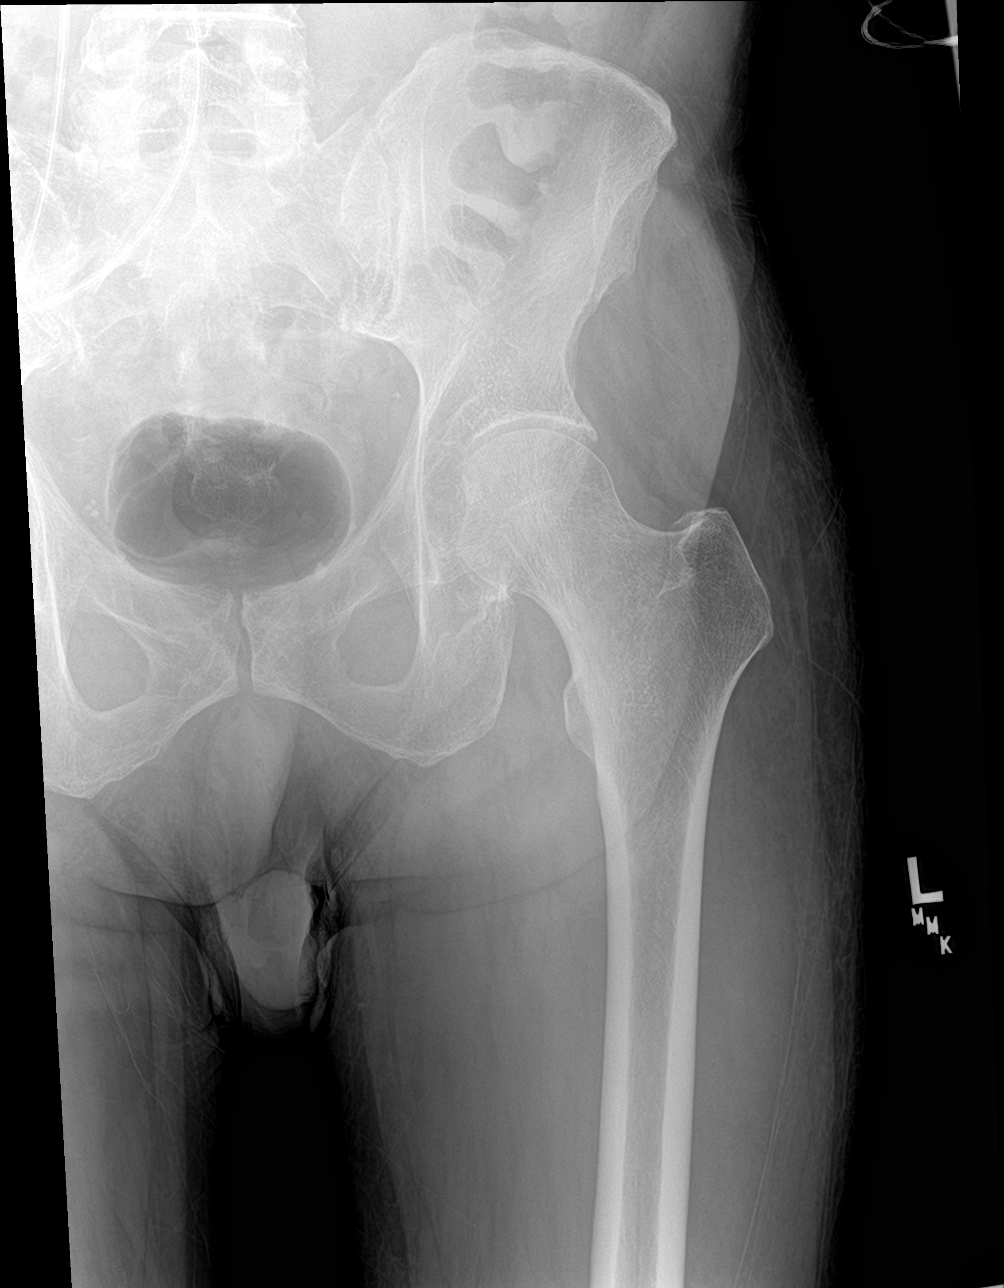

[femur ap (2 of 2)]
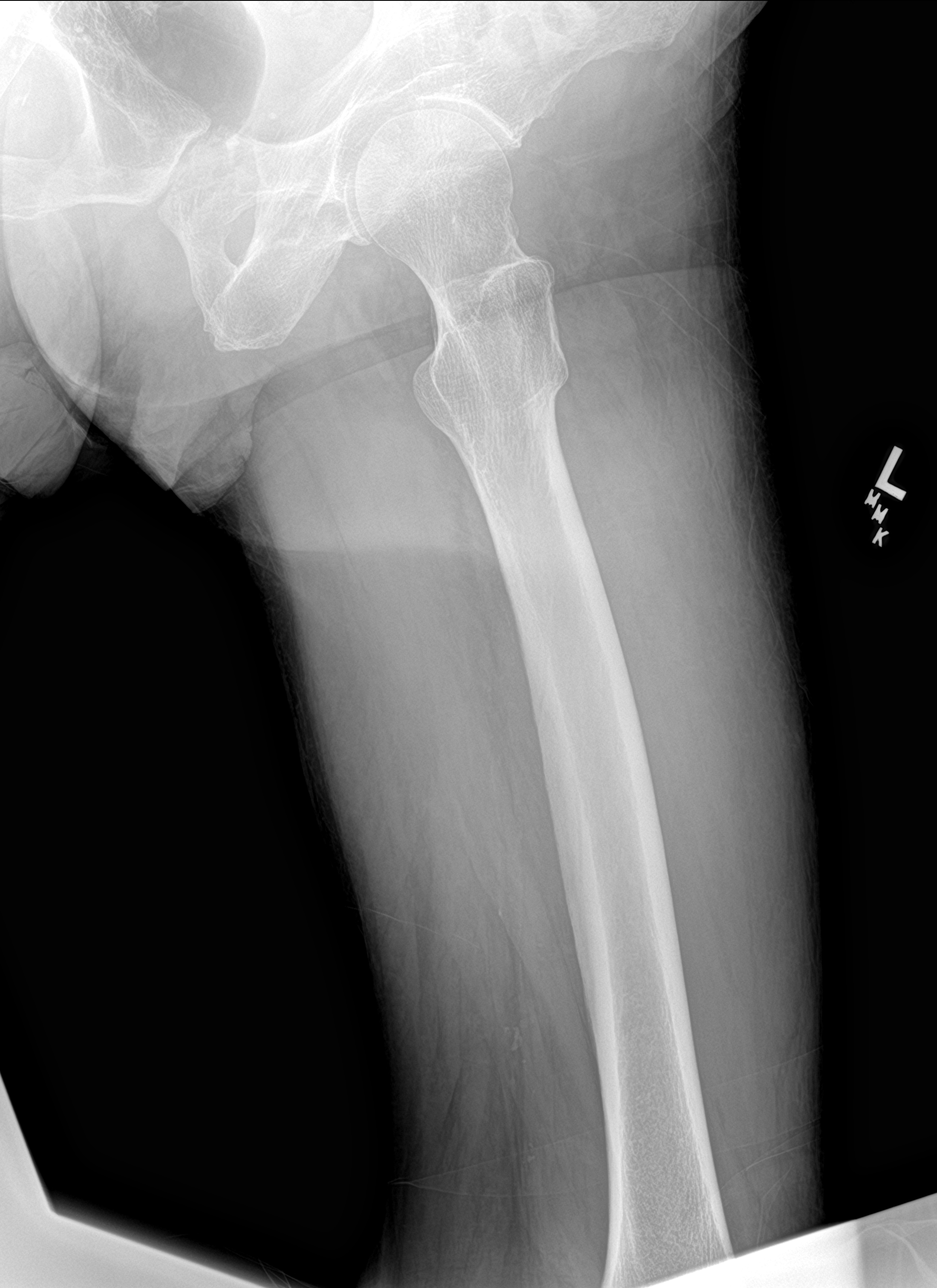

[femur lat (1 of 2)]
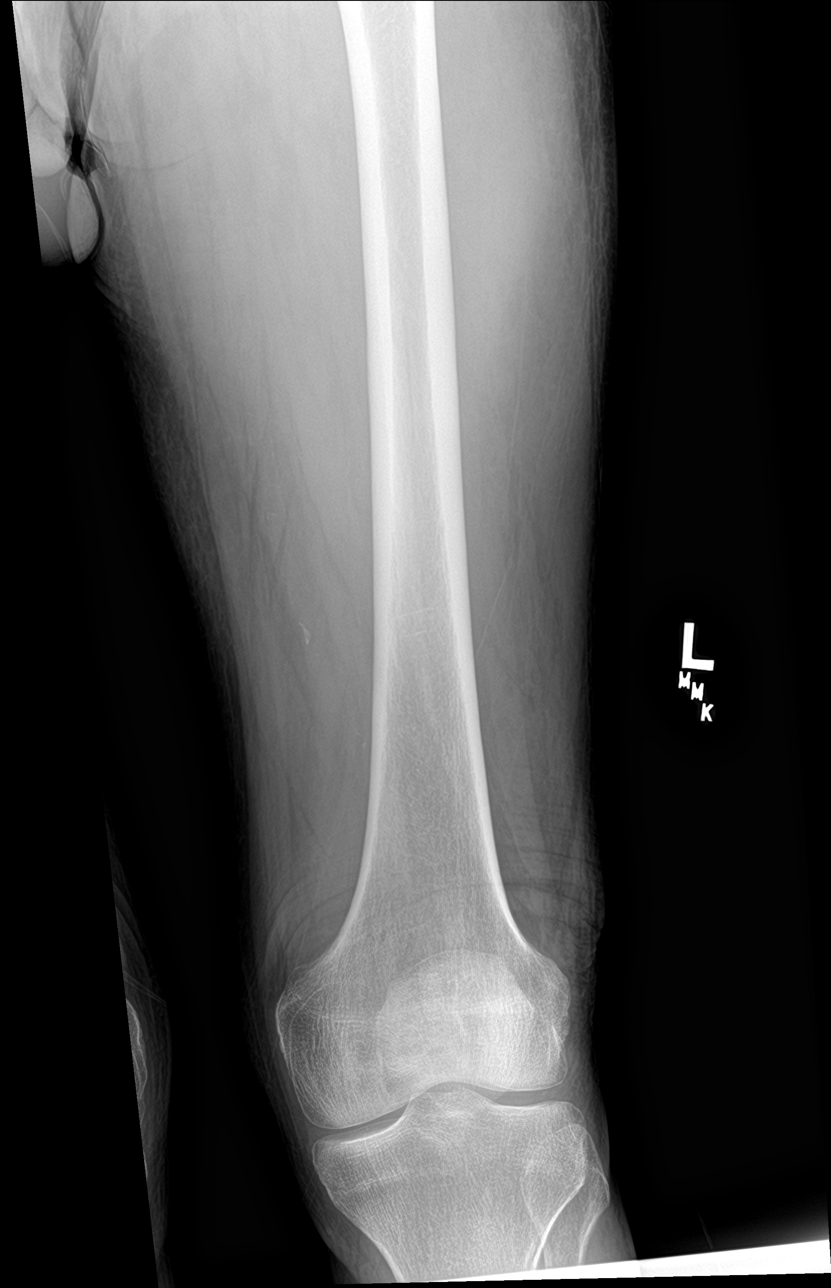

[femur lat (2 of 2)]
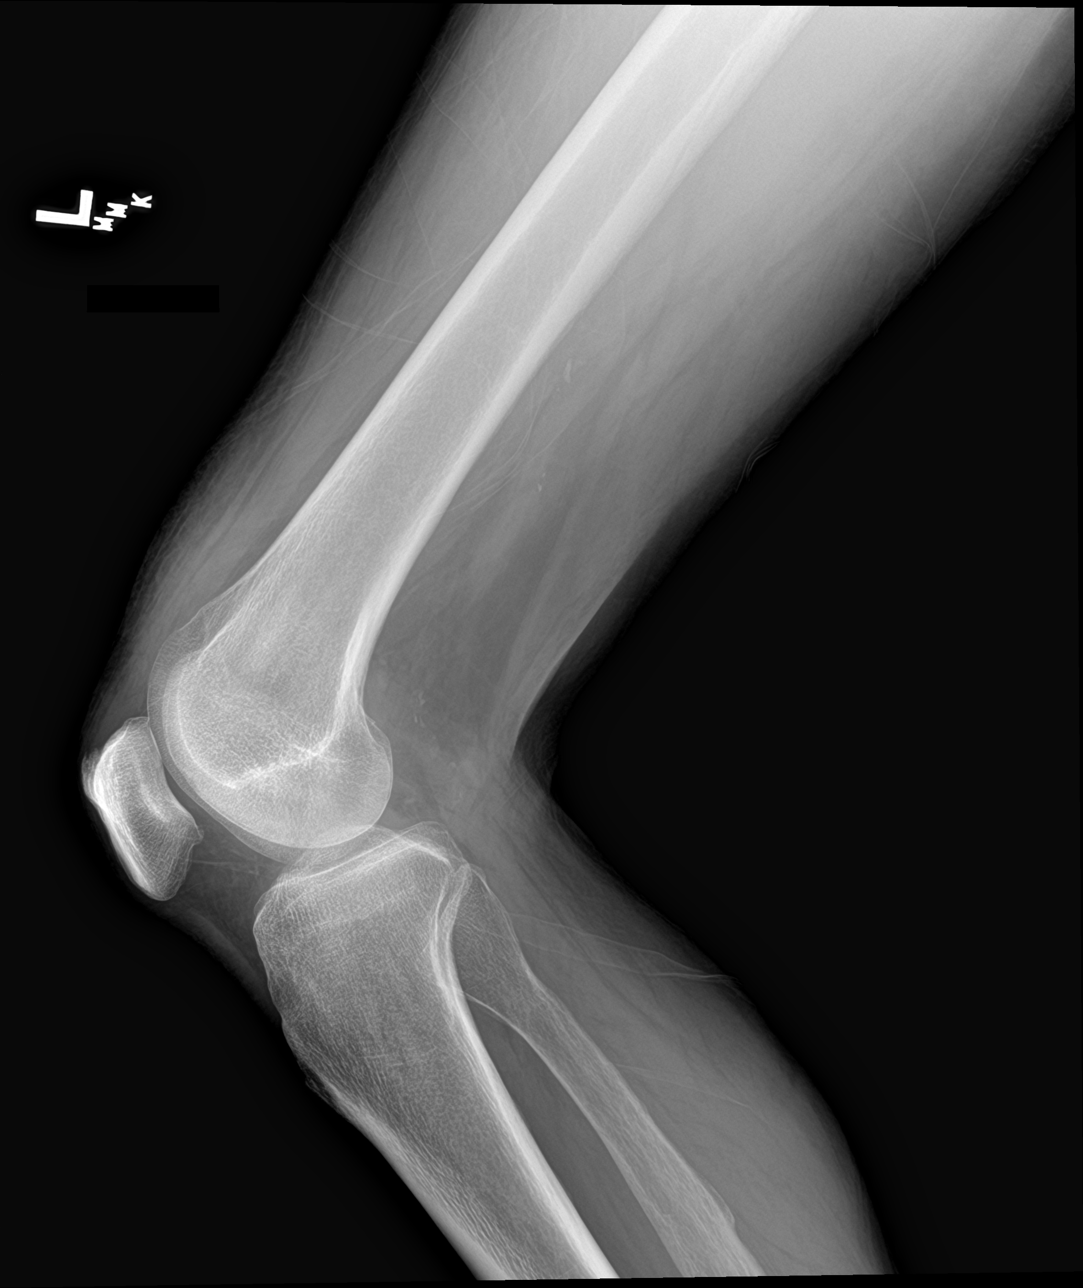

[4 of 4 positions shown; findings below may reference images not displayed]

FINDINGS: There is no evidence of fracture or other focal bone lesions. Soft
tissues are unremarkable.
IMPRESSION: Negative.

## 2017-09-01 ENCOUNTER — Encounter: Payer: Self-pay | Admitting: Diagnostic Neuroimaging

## 2017-09-01 ENCOUNTER — Ambulatory Visit (INDEPENDENT_AMBULATORY_CARE_PROVIDER_SITE_OTHER): Payer: Medicare Other | Admitting: Diagnostic Neuroimaging

## 2017-09-01 VITALS — BP 131/77 | HR 48 | Ht 66.0 in | Wt 148.8 lb

## 2017-09-01 DIAGNOSIS — G20 Parkinson's disease: Secondary | ICD-10-CM | POA: Diagnosis not present

## 2017-09-01 DIAGNOSIS — I63311 Cerebral infarction due to thrombosis of right middle cerebral artery: Secondary | ICD-10-CM | POA: Diagnosis not present

## 2017-09-01 DIAGNOSIS — I639 Cerebral infarction, unspecified: Secondary | ICD-10-CM | POA: Diagnosis not present

## 2017-09-01 MED ORDER — CARBIDOPA-LEVODOPA 25-100 MG PO TABS: 1.0000 | ORAL_TABLET | Freq: Four times a day (QID) | ORAL | 4 refills | Status: DC

## 2017-09-01 NOTE — Progress Notes (Signed)
[facility]  PATIENT: Donald Richardson DOB: [birth year]  REFERRING CLINICIAN: Redmond School, J HISTORY FROM: patient  REASON FOR VISIT: follow up   HISTORICAL  CHIEF COMPLAINT:  Chief Complaint  Patient presents with  . Follow-up  . Cerebrovascular Accident    Doing well, Tremors same, Using walker at home, no fall.  Caregiver with pt 8 hours daily.  Brother lives in [place] (with pt)).    . Parkinson disease    Has DMV form (to be filled out neuro section by [place] and Dr. Harrington Challenger cardiology (his part) then back to Dr. Redmond School for final ).     HISTORY OF PRESENT ILLNESS:   UPDATE (09/01/17, VRP): Since last visit, doing well. Tolerating meds. No alleviating or aggravating factors. Tremor is stable (taking carb/levo 4 x per day).   UPDATE (02/20/17, VRP): Since last visit, doing better with tremor. Tolerating carb/levo --> helping with tremor. Gait is still poor. No alleviating or aggravating factors. Not driving much anymore.   PRIOR HPI (11/21/16) 77 year old male here for evaluation of stroke hospital discharge. Also requested for evaluation of possible Parkinson's disease. 06/20/16 patient was admitted to the hospital due to gait and balance difficulties, multiple falls. Symptoms lasted for 2 days before patient went to the hospital. He was diagnosed with right brain subcortical ischemic infarction. Stroke workup was completed. He was exposed to follow-up in neurology clinic earlier after his discharge. Since that time patient had further balance and gait difficulty, with a severe fall in June resulting in several fractures. Now patient having significant difficulty with his activities of daily living. He lives alone but has a caregiver come every day for 8 hours. His brother lives in [place] but visits him once a week. Also of note patient has had at least 10-15 year history of progressive resting tremor, stooped posture, shuffling gait, according to patient's brother.  Patient feels like he has had right hand tremor for almost 30 years. Patient's father had Parkinson's disease.   REVIEW OF SYSTEMS: Full 14 system review of systems performed and negative with exception of:  Tremors hearing loss incontinence walking diff.    ALLERGIES: No Known Allergies  HOME MEDICATIONS: Outpatient Medications Prior to Visit  Medication Sig Dispense Refill  . acetaminophen (TYLENOL) 500 MG tablet Take 1,000 mg by mouth every 6 (six) hours as needed for mild pain.    Marland Kitchen aspirin EC 325 MG EC tablet Take 1 tablet (325 mg total) by mouth daily. 30 tablet 0  . carbidopa-levodopa (SINEMET IR) 25-100 MG tablet Take 1 tablet by mouth 3 (three) times daily before meals. 90 tablet 12  . clopidogrel (PLAVIX) 75 MG tablet TAKE 1 TABLET BY MOUTH EVERY DAY. 15 tablet 0  . lisinopril (PRINIVIL,ZESTRIL) 10 MG tablet Take 1 tablet (10 mg total) daily by mouth. 90 tablet 3  . rosuvastatin (CRESTOR) 20 MG tablet Take 1 tablet (20 mg total) by mouth at bedtime. 30 tablet 0   No facility-administered medications prior to visit.     PAST MEDICAL HISTORY: Past Medical History:  Diagnosis Date  . ASHD (arteriosclerotic heart disease)    STENT  . COPD (chronic obstructive pulmonary disease) ([place])   . Diabetes mellitus    Hgb A1C- 5.8 in May 2017  . Hemorrhoids   . Hyperlipidemia   . Hypertension   . Smoker     PAST SURGICAL HISTORY: Past Surgical History:  Procedure Laterality Date  . INGUINAL HERNIA REPAIR Left 10/26/2015   Procedure:  LEFT INGUINAL HERNIA REPAIR WITH MESH;  Surgeon: Jackolyn Confer, MD;  Location: [facility];  Service: General;  Laterality: Left;  . INSERTION OF MESH Left 10/26/2015   Procedure: INSERTION OF MESH;  Surgeon: Jackolyn Confer, MD;  Location: [facility];  Service: General;  Laterality: Left;  . NO PAST SURGERIES      FAMILY HISTORY: Family History  Problem Relation Age of Onset  . Breast cancer Mother   . Parkinson's disease Father     SOCIAL  HISTORY:  Social History   Socioeconomic History  . Marital status: Single    Spouse name: Not on file  . Number of children: Not on file  . Years of education: Not on file  . Highest education level: Not on file  Occupational History  . Not on file  Social Needs  . Financial resource strain: Not on file  . Food insecurity:    Worry: Not on file    Inability: Not on file  . Transportation needs:    Medical: Not on file    Non-medical: Not on file  Tobacco Use  . Smoking status: Former Smoker    Packs/day: 1.00    Types: Cigarettes    Last attempt to quit: 06/20/2016    Years since quitting: 1.2  . Smokeless tobacco: Never Used  Substance and Sexual Activity  . Alcohol use: No  . Drug use: No  . Sexual activity: Not Currently  Lifestyle  . Physical activity:    Days per week: Not on file    Minutes per session: Not on file  . Stress: Not on file  Relationships  . Social connections:    Talks on phone: Not on file    Gets together: Not on file    Attends religious service: Not on file    Active member of club or organization: Not on file    Attends meetings of clubs or organizations: Not on file    Relationship status: Not on file  . Intimate partner violence:    Fear of current or ex partner: Not on file    Emotionally abused: Not on file    Physically abused: Not on file    Forced sexual activity: Not on file  Other Topics Concern  . Not on file  Social History Narrative  . Not on file     PHYSICAL EXAM  GENERAL EXAM/CONSTITUTIONAL: Vitals:  Vitals:   09/01/17 1001  BP: 131/77  Pulse: (!) 48  Weight: 148 lb 12.8 oz (67.5 kg)  Height: 5\' 6"  (1.676 m)   Body mass index is 24.02 kg/m. No exam data present  Patient is in no distress; well developed, nourished and groomed; neck is supple  CARDIOVASCULAR:  Examination of carotid arteries is normal; no carotid bruits  Regular rate and rhythm, no murmurs  Examination of peripheral vascular system  by observation and palpation is normal  EYES:  Ophthalmoscopic exam of optic discs and posterior segments is normal; no papilledema or hemorrhages  MUSCULOSKELETAL:  Gait, strength, tone, movements noted in Neurologic exam below  NEUROLOGIC: MENTAL STATUS:  No flowsheet data found.  awake, alert, oriented to person, place and time  [facility] memory   DECR attention and concentration  language fluent, comprehension intact, naming intact,   fund of knowledge appropriate  CRANIAL NERVE:   2nd - no papilledema on fundoscopic exam  2nd, 3rd, 4th, 6th - pupils equal and reactive to light, visual fields full to confrontation, extraocular muscles intact, no nystagmus  5th - facial sensation symmetric  7th - facial strength symmetric  8th - hearing intact  9th - palate elevates symmetrically, uvula midline  11th - shoulder shrug symmetric  12th - tongue protrusion midline  MASKED FACIES  SOFT, HOARSE VOICE  MOTOR:   RESTING TREMOR IN RUE > LUE  INCREASED TONE (COGWHEELING RIGIDTY) IN LUE > RUE  MILD BRADYKINESIA IN BUE AND BLE  normal bulk; full strength in the BUE, BLE EXCEPT LIMITED IN LEFT ARM DUE TO LEFT SHOULDER PAIN; BLE 4+  SENSORY:   normal and symmetric to light touch, temperature, vibration  COORDINATION:   finger-nose-finger, fine finger movements normal  REFLEXES:   deep tendon reflexes TRACE and symmetric  GAIT/STATION:   IN WHEELCHAIR; ABLE TO STAND UNASSISTED; SHORT STEPS; UNSTEADY    DIAGNOSTIC DATA (LABS, IMAGING, TESTING) - I reviewed patient records, labs, notes, testing and imaging myself where available.  Lab Results  Component Value Date   WBC 8.1 07/10/2017   HGB 14.5 07/10/2017   HCT 43.2 07/10/2017   MCV 87 07/10/2017   PLT 144 (L) 07/10/2017      Component Value Date/Time   NA 140 07/10/2017 1431   K 4.5 07/10/2017 1431   CL 100 07/10/2017 1431   CO2 24 07/10/2017 1431   GLUCOSE 100 (H) 07/10/2017 1431   GLUCOSE  179 (H) 08/20/2016 1345   BUN 15 07/10/2017 1431   CREATININE 1.72 (H) 07/10/2017 1431   CREATININE 1.47 (H) 08/29/2015 0909   CALCIUM 9.0 07/10/2017 1431   PROT 6.9 07/10/2017 1431   ALBUMIN 4.1 07/10/2017 1431   AST 11 07/10/2017 1431   ALT 7 07/10/2017 1431   ALKPHOS 82 07/10/2017 1431   BILITOT 0.5 07/10/2017 1431   GFRNONAA 38 (L) 07/10/2017 1431   GFRAA 44 (L) 07/10/2017 1431   Lab Results  Component Value Date   CHOL 114 07/10/2017   HDL 36 (L) 07/10/2017   LDLCALC 46 07/10/2017   LDLDIRECT 99.1 09/18/2008   TRIG 158 (H) 07/10/2017   CHOLHDL 3.2 07/10/2017   Lab Results  Component Value Date   HGBA1C 6.3 07/10/2017   No results found for: VITAMINB12 Lab Results  Component Value Date   TSH 1.16 07/19/2015     06/20/16 MRI / MRA head [I reviewed images myself and agree with interpretation. Also with moderate-severe atrophy. -VRP]  1. Acute infarct of the right caudate tail, extending inferiorly along the posterior limb of the right internal capsule. This is in keeping with the reported left-sided weakness. 2. No acute hemorrhage or mass effect. 3. 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 4. Normal intracranial MRA. 5. Chronic microvascular ischemia and age advanced atrophy.  06/22/16 CT head  - Bilateral: intimal wall thickening CCA. Mild soft plaque origin ICA. 1-39% ICA plaquing. Vertebral artery flow is antegrade.  06/21/16 TTE - Pt bradycardic during the study with HR high 30s and low 40s. Hypokinesis of the distal inferior wall with overall preserved LV systolic function; moderate LVH; grade 2 diastolic dysfunction; mildly dilated aortic root; mild TR with mildly elevated pulmonary pressure.     ASSESSMENT AND PLAN  77 y.o. 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  Also with progressive history (~since 2005) of gradual onset progressive resting tremor, slow shuffling gait, balance difficulty, most consistent with idiopathic Parkinson's disease.  Dx:  1. Parkinson's disease ([place])   2. Cerebrovascular accident (CVA) due to thrombosis of right middle cerebral artery (HCC)      PLAN:  STROKE PREVENTION - continue aspirin 325mg  + clopidogrel 75mg  daily - continue crestor - monitor BP at home and follow up with PCP  PARKINSON'S DISEASE - continue carb/levo 25/100 --> 1 tab 4 times a day  - continue home health aids and therapy - no driving due to significant gait instablility  Meds ordered this encounter  Medications  . carbidopa-levodopa (SINEMET IR) 25-100 MG tablet    Sig: Take 1 tablet by mouth 4 (four) times daily.    Dispense:  360 tablet    Refill:  4   Return in about 1 year (around 09/02/2018) for with NP.    Penni Bombard, MD 10/02/5001, 70:48 AM Certified in Neurology, Neurophysiology and [facility] [address] [place], [place] [address] [phone]

## 2017-09-29 ENCOUNTER — Telehealth: Payer: Self-pay | Admitting: Family Medicine

## 2017-09-29 NOTE — Telephone Encounter (Signed)
Donald Richardson and Donald Richardson called.  Donald Richardson gave permission to talk with Donald Richardson his brother who has legal power of attorney. He asked about the [place] form.  Dr. Redmond School did mark that Donald Richardson needs help handling his financial affairs and has Donald Richardson as his legal power of attorney.

## 2017-10-31 ENCOUNTER — Other Ambulatory Visit: Payer: Self-pay | Admitting: Internal Medicine

## 2017-12-03 ENCOUNTER — Ambulatory Visit: Payer: Self-pay | Admitting: Family Medicine

## 2017-12-06 ENCOUNTER — Other Ambulatory Visit: Payer: Self-pay | Admitting: Internal Medicine

## 2017-12-08 ENCOUNTER — Telehealth: Payer: Self-pay

## 2017-12-08 ENCOUNTER — Other Ambulatory Visit: Payer: Self-pay | Admitting: Internal Medicine

## 2017-12-08 MED ORDER — CLOPIDOGREL BISULFATE 75 MG PO TABS: 75.0000 mg | ORAL_TABLET | Freq: Every day | ORAL | 0 refills | Status: DC

## 2017-12-08 NOTE — Telephone Encounter (Signed)
Pt is requesting refill on Rx Plavix and has not been seen since 2017. Attempted to send to scheduling but pt decline and said he will contact Dr. Harrington Challenger personally. Please address. Thank you.

## 2017-12-08 NOTE — Telephone Encounter (Signed)
Rx was sent in with 30 dispense and 0 refills. Pt was notified.

## 2017-12-08 NOTE — Telephone Encounter (Signed)
Refill plavix Place on maybe next week (he has an appt soon with Redmond School) Pt called home and discussed with GT

## 2017-12-08 NOTE — Telephone Encounter (Signed)
Please refill. I am trying to decide where in her schedule to put Mr. Scheck. Thank you.

## 2017-12-09 NOTE — Telephone Encounter (Addendum)
No answer at home number. VM not set up on mobile number.  Have placed HOLD for patient if he can come on 12/15/17 --2:40 pm

## 2017-12-10 NOTE — Telephone Encounter (Signed)
Again tried to reach patient unsuccessfully.

## 2017-12-14 DIAGNOSIS — H6123 Impacted cerumen, bilateral: Secondary | ICD-10-CM | POA: Diagnosis not present

## 2017-12-21 ENCOUNTER — Ambulatory Visit: Payer: Medicare Other | Admitting: Family Medicine

## 2018-01-07 ENCOUNTER — Telehealth: Payer: Self-pay

## 2018-01-07 DIAGNOSIS — I152 Hypertension secondary to endocrine disorders: Secondary | ICD-10-CM

## 2018-01-07 DIAGNOSIS — E1159 Type 2 diabetes mellitus with other circulatory complications: Secondary | ICD-10-CM

## 2018-01-07 DIAGNOSIS — I10 Essential (primary) hypertension: Principal | ICD-10-CM

## 2018-01-07 MED ORDER — CLOPIDOGREL BISULFATE 75 MG PO TABS: 75.0000 mg | ORAL_TABLET | Freq: Every day | ORAL | 0 refills | Status: DC

## 2018-01-07 MED ORDER — CARBIDOPA-LEVODOPA 25-100 MG PO TABS: 1.0000 | ORAL_TABLET | Freq: Four times a day (QID) | ORAL | 4 refills | Status: DC

## 2018-01-07 MED ORDER — ROSUVASTATIN CALCIUM 20 MG PO TABS: 20.0000 mg | ORAL_TABLET | Freq: Every day | ORAL | 0 refills | Status: DC

## 2018-01-07 MED ORDER — ASPIRIN 325 MG PO TBEC: 325.0000 mg | DELAYED_RELEASE_TABLET | Freq: Every day | ORAL | 0 refills | Status: DC

## 2018-01-07 MED ORDER — LISINOPRIL 10 MG PO TABS: 10.0000 mg | ORAL_TABLET | Freq: Every day | ORAL | 3 refills | Status: DC

## 2018-01-07 MED ORDER — ROSUVASTATIN CALCIUM 20 MG PO TABS
20.0000 mg | ORAL_TABLET | Freq: Every day | ORAL | 0 refills | Status: DC
Start: 2018-01-07 — End: 2018-01-07

## 2018-01-07 NOTE — Telephone Encounter (Signed)
Spoke to Dr Harrington Challenger who had me refill the patient's medications.

## 2018-01-26 ENCOUNTER — Ambulatory Visit: Payer: Medicare Other | Admitting: Internal Medicine

## 2018-01-26 ENCOUNTER — Other Ambulatory Visit: Payer: Self-pay | Admitting: *Deleted

## 2018-01-26 ENCOUNTER — Other Ambulatory Visit: Payer: Medicare Other | Admitting: *Deleted

## 2018-01-26 DIAGNOSIS — E785 Hyperlipidemia, unspecified: Secondary | ICD-10-CM | POA: Diagnosis not present

## 2018-01-26 DIAGNOSIS — E1159 Type 2 diabetes mellitus with other circulatory complications: Secondary | ICD-10-CM

## 2018-01-26 DIAGNOSIS — R350 Frequency of micturition: Secondary | ICD-10-CM | POA: Diagnosis not present

## 2018-01-26 DIAGNOSIS — E1169 Type 2 diabetes mellitus with other specified complication: Secondary | ICD-10-CM

## 2018-01-26 DIAGNOSIS — I2510 Atherosclerotic heart disease of native coronary artery without angina pectoris: Secondary | ICD-10-CM

## 2018-01-26 DIAGNOSIS — I10 Essential (primary) hypertension: Principal | ICD-10-CM

## 2018-01-27 LAB — COMPREHENSIVE METABOLIC PANEL
ALBUMIN: 3.8 g/dL (ref 3.5–4.8)
ALT: 9 IU/L (ref 0–44)
AST: 16 IU/L (ref 0–40)
Albumin/Globulin Ratio: 1.3 (ref 1.2–2.2)
Alkaline Phosphatase: 84 IU/L (ref 39–117)
BUN / CREAT RATIO: 10 (ref 10–24)
BUN: 14 mg/dL (ref 8–27)
Bilirubin Total: 0.4 mg/dL (ref 0.0–1.2)
CALCIUM: 9.2 mg/dL (ref 8.6–10.2)
CO2: 27 mmol/L (ref 20–29)
CREATININE: 1.37 mg/dL — AB (ref 0.76–1.27)
Chloride: 100 mmol/L (ref 96–106)
GFR calc Af Amer: 58 mL/min/{1.73_m2} — ABNORMAL LOW (ref >59)
GFR, EST NON AFRICAN AMERICAN: 50 mL/min/{1.73_m2} — AB (ref >59)
GLOBULIN, TOTAL: 2.9 g/dL (ref 1.5–4.5)
Glucose: 138 mg/dL — ABNORMAL HIGH (ref 65–99)
Potassium: 4.5 mmol/L (ref 3.5–5.2)
Sodium: 141 mmol/L (ref 134–144)
TOTAL PROTEIN: 6.7 g/dL (ref 6.0–8.5)

## 2018-01-27 LAB — HEMOGLOBIN A1C
Est. average glucose Bld gHb Est-mCnc: 120 mg/dL
Hgb A1c MFr Bld: 5.8 % — ABNORMAL HIGH (ref 4.8–5.6)

## 2018-01-27 LAB — LIPID PANEL
CHOL/HDL RATIO: 3.1 ratio (ref 0.0–5.0)
Cholesterol, Total: 110 mg/dL (ref 100–199)
HDL: 35 mg/dL — ABNORMAL LOW (ref >39)
LDL CALC: 43 mg/dL (ref 0–99)
TRIGLYCERIDES: 161 mg/dL — AB (ref 0–149)
VLDL Cholesterol Cal: 32 mg/dL (ref 5–40)

## 2018-01-27 LAB — CBC
HEMATOCRIT: 45.6 % (ref 37.5–51.0)
HEMOGLOBIN: 14.7 g/dL (ref 13.0–17.7)
MCH: 29.2 pg (ref 26.6–33.0)
MCHC: 32.2 g/dL (ref 31.5–35.7)
MCV: 91 fL (ref 79–97)
Platelets: 139 10*3/uL — ABNORMAL LOW (ref 150–450)
RBC: 5.04 x10E6/uL (ref 4.14–5.80)
RDW: 14.3 % (ref 12.3–15.4)
WBC: 6.8 10*3/uL (ref 3.4–10.8)

## 2018-01-27 LAB — TSH: TSH: 0.916 u[IU]/mL (ref 0.450–4.500)

## 2018-01-27 LAB — PSA: PROSTATE SPECIFIC AG, SERUM: 3.2 ng/mL (ref 0.0–4.0)

## 2018-03-24 ENCOUNTER — Telehealth: Payer: Self-pay

## 2018-03-24 NOTE — Telephone Encounter (Signed)
Called pt to make an mwv appt. No answer . [place]

## 2018-04-08 ENCOUNTER — Other Ambulatory Visit: Payer: Self-pay | Admitting: Internal Medicine

## 2018-04-13 ENCOUNTER — Telehealth: Payer: Self-pay | Admitting: Internal Medicine

## 2018-04-13 MED ORDER — CLOPIDOGREL BISULFATE 75 MG PO TABS: 75.0000 mg | ORAL_TABLET | Freq: Every day | ORAL | 1 refills | Status: DC

## 2018-04-13 MED ORDER — ROSUVASTATIN CALCIUM 20 MG PO TABS: 20.0000 mg | ORAL_TABLET | Freq: Every day | ORAL | 1 refills | Status: DC

## 2018-04-13 NOTE — Telephone Encounter (Signed)
Patient called yesterday    Needs refill on meds   (Not sure which one Would refill all for 90 days each    Goes to [facility] on [address]

## 2018-04-13 NOTE — Addendum Note (Signed)
Addended by: Rodman Key on: 04/13/2018 08:39 AM   Modules accepted: Orders

## 2018-04-13 NOTE — Telephone Encounter (Signed)
Refilled crestor and plavix to [facility].

## 2018-04-18 ENCOUNTER — Telehealth: Payer: Self-pay | Admitting: Internal Medicine

## 2018-04-18 NOTE — Telephone Encounter (Signed)
Refill called in for plavix 75 mg per day  90 day supply   3 refills At [facility] [address] Need to confirm pharmacy is this location for his primary

## 2018-04-20 NOTE — Telephone Encounter (Addendum)
Talked to patient. He uses the [facility] at [facility].  This is the [address] location. He went yesterday to pick up Plavix and was told insurance would not cover until today, because of the prescription he picked up last week - they filled 15 days worth.  He will pick up today. Previous plavix prescription sent to different pharmacy and so he had not picked that up Also confirmed he has refills of crestor.

## 2018-09-06 ENCOUNTER — Ambulatory Visit: Payer: Medicare Other | Admitting: Family Medicine

## 2018-09-06 ENCOUNTER — Ambulatory Visit: Payer: Medicare Other | Admitting: Nurse Practitioner

## 2018-10-07 ENCOUNTER — Other Ambulatory Visit: Payer: Self-pay

## 2018-10-07 ENCOUNTER — Ambulatory Visit (INDEPENDENT_AMBULATORY_CARE_PROVIDER_SITE_OTHER): Payer: Medicare Other | Admitting: Family Medicine

## 2018-10-07 ENCOUNTER — Encounter: Payer: Self-pay | Admitting: Family Medicine

## 2018-10-07 VITALS — BP 120/78 | HR 34 | Temp 98.4°F | Ht 68.0 in | Wt 136.8 lb

## 2018-10-07 DIAGNOSIS — G20 Parkinson's disease: Secondary | ICD-10-CM

## 2018-10-07 DIAGNOSIS — I639 Cerebral infarction, unspecified: Secondary | ICD-10-CM | POA: Diagnosis not present

## 2018-10-07 DIAGNOSIS — R001 Bradycardia, unspecified: Secondary | ICD-10-CM

## 2018-10-07 DIAGNOSIS — I6381 Other cerebral infarction due to occlusion or stenosis of small artery: Secondary | ICD-10-CM

## 2018-10-07 NOTE — Progress Notes (Addendum)
PATIENT: Donald Richardson DOB: 1940/06/07  REASON FOR VISIT: follow up HISTORY FROM: patient  Chief Complaint  Patient presents with   Follow-up    Rm 1, brother   Parkinson's disease    PD/ stroke, using walker or WC.  Falls- none     HISTORY OF PRESENT ILLNESS: Today 10/07/18 Donald Richardson is a 78 y.o. male here today for follow up of prior ischemic CVA in 2018 and PD. He continues carb/levo 25-100 TID. He reports that he decreased dose on his because his hand tremors were improved. He has not had any falls. He is able to navigate around the home with walker. He is taking Crestor 20mg , Plavix 75mg  and asa 325mg  daily. He has regular folow up with cardiology. He is scheduling appt with PCP soon. Last A1C was 5.8. Creatinine 1.37.   Patient's heart rate in the office is 34 to 40 bpm.  He and his brother both report that baseline heart rate has been in the 40s recently.  He is followed by cardiology regularly.  He denies any symptoms of dizziness, lightheadedness or excessive fatigue.  HISTORY: (copied from Dr Gladstone Lighter note on 09/01/2017)  UPDATE (09/01/17, VRP): Since last visit, doing well. Tolerating meds. No alleviating or aggravating factors. Tremor is stable (taking carb/levo 4 x per day).    UPDATE (02/20/17, VRP): Since last visit, doing better with tremor. Tolerating carb/levo --> helping with tremor. Gait is still poor. No alleviating or aggravating factors. Not driving much anymore.    PRIOR HPI (11/21/16) 78 year old male here for evaluation of stroke hospital discharge. Also requested for evaluation of possible Parkinson's disease. 06/20/16 patient was admitted to the hospital due to gait and balance difficulties, multiple falls. Symptoms lasted for 2 days before patient went to the hospital. He was diagnosed with right brain subcortical ischemic infarction. Stroke workup was completed. He was exposed to follow-up in neurology clinic earlier after his discharge. Since that time  patient had further balance and gait difficulty, with a severe fall in June resulting in several fractures. Now patient having significant difficulty with his activities of daily living. He lives alone but has a caregiver come every day for 8 hours. His brother lives in [place] but visits him once a week. Also of note patient has had at least 10-15 year history of progressive resting tremor, stooped posture, shuffling gait, according to patient's brother. Patient feels like he has had right hand tremor for almost 30 years. Patient's father had Parkinson's disease.   REVIEW OF SYSTEMS: Out of a complete 14 system review of symptoms, the patient complains only of the following symptoms, gait difficulty and all other reviewed systems are negative.   ALLERGIES: No Known Allergies  HOME MEDICATIONS: Outpatient Medications Prior to Visit  Medication Sig Dispense Refill   aspirin 325 MG EC tablet Take 1 tablet (325 mg total) by mouth daily. 90 tablet 0   carbidopa-levodopa (SINEMET IR) 25-100 MG tablet Take 1 tablet by mouth 4 (four) times daily. (Patient taking differently: Take 1 tablet by mouth 3 (three) times daily. ) 360 tablet 4   clopidogrel (PLAVIX) 75 MG tablet Take 1 tablet (75 mg total) by mouth daily. 90 tablet 1   lisinopril (PRINIVIL,ZESTRIL) 10 MG tablet Take 1 tablet (10 mg total) by mouth daily. 90 tablet 3   rosuvastatin (CRESTOR) 20 MG tablet Take 1 tablet (20 mg total) by mouth at bedtime. 90 tablet 1   acetaminophen (TYLENOL) 500 MG tablet  Take 1,000 mg by mouth every 6 (six) hours as needed for mild pain.     No facility-administered medications prior to visit.     PAST MEDICAL HISTORY: Past Medical History:  Diagnosis Date   ASHD (arteriosclerotic heart disease)    STENT   COPD (chronic obstructive pulmonary disease) (HCC)    Diabetes mellitus    Hgb A1C- 5.8 in May 2017   Hemorrhoids    Hyperlipidemia    Hypertension    Smoker     PAST SURGICAL  HISTORY: Past Surgical History:  Procedure Laterality Date   INGUINAL HERNIA REPAIR Left 10/26/2015   Procedure: LEFT INGUINAL HERNIA REPAIR WITH MESH;  Surgeon: Jackolyn Confer, MD;  Location: [facility];  Service: General;  Laterality: Left;   INSERTION OF MESH Left 10/26/2015   Procedure: INSERTION OF MESH;  Surgeon: Jackolyn Confer, MD;  Location: [facility];  Service: General;  Laterality: Left;   NO PAST SURGERIES      FAMILY HISTORY: Family History  Problem Relation Age of Onset   Breast cancer Mother    Parkinson's disease Father     SOCIAL HISTORY: Social History   Socioeconomic History   Marital status: Single    Spouse name: Not on file   Number of children: Not on file   Years of education: Not on file   Highest education level: Not on file  Occupational History   Not on file  Social Needs   Financial resource strain: Not on file   Food insecurity    Worry: Not on file    Inability: Not on file   Transportation needs    Medical: Not on file    Non-medical: Not on file  Tobacco Use   Smoking status: Former Smoker    Packs/day: 1.00    Types: Cigarettes    Quit date: 06/20/2016    Years since quitting: 2.2   Smokeless tobacco: Never Used  Substance and Sexual Activity   Alcohol use: No   Drug use: No   Sexual activity: Not Currently  Lifestyle   Physical activity    Days per week: Not on file    Minutes per session: Not on file   Stress: Not on file  Relationships   Social connections    Talks on phone: Not on file    Gets together: Not on file    Attends religious service: Not on file    Active member of club or organization: Not on file    Attends meetings of clubs or organizations: Not on file    Relationship status: Not on file   Intimate partner violence    Fear of current or ex partner: Not on file    Emotionally abused: Not on file    Physically abused: Not on file    Forced sexual activity: Not on file  Other Topics Concern   Not on file   Social History Narrative   Not on file      PHYSICAL EXAM  Vitals:   10/07/18 1438  BP: 120/78  Pulse: (!) 34  Temp: 98.4 F (36.9 C)  Weight: 136 lb 12.8 oz (62.1 kg)  Height: 5\' 8"  (1.727 m)   Body mass index is 20.8 kg/m.  Generalized: Well developed, in no acute distress  Cardiology: bradycardia noted 35-40BPM, asymptomatic  Neurological examination  Mentation: Alert oriented to time, place, history taking. Follows all commands speech and language fluent Cranial nerve II-XII: Pupils were equal round reactive to light. Extraocular movements  were full, visual field were full on confrontational test. Facial sensation and strength were normal. Uvula tongue midline. Head turning and shoulder shrug  were normal and symmetric. Motor: The motor testing reveals 4 over 5 strength of all 4 extremities. Good symmetric motor tone is noted throughout. Bradykinesia noted with finger taps, dysrhythmic toe taps Sensory: Sensory testing is intact to soft touch on all 4 extremities. No evidence of extinction is noted.  Coordination: Cerebellar testing reveals slow finger-nose-finger and heel-to-shin bilaterally.    DIAGNOSTIC DATA (LABS, IMAGING, TESTING) - I reviewed patient records, labs, notes, testing and imaging myself where available.  No flowsheet data found.   Lab Results  Component Value Date   WBC 6.8 01/26/2018   HGB 14.7 01/26/2018   HCT 45.6 01/26/2018   MCV 91 01/26/2018   PLT 139 (L) 01/26/2018      Component Value Date/Time   NA 141 01/26/2018 1355   K 4.5 01/26/2018 1355   CL 100 01/26/2018 1355   CO2 27 01/26/2018 1355   GLUCOSE 138 (H) 01/26/2018 1355   GLUCOSE 179 (H) 08/20/2016 1345   BUN 14 01/26/2018 1355   CREATININE 1.37 (H) 01/26/2018 1355   CREATININE 1.47 (H) 08/29/2015 0909   CALCIUM 9.2 01/26/2018 1355   PROT 6.7 01/26/2018 1355   ALBUMIN 3.8 01/26/2018 1355   AST 16 01/26/2018 1355   ALT 9 01/26/2018 1355   ALKPHOS 84 01/26/2018 1355    BILITOT 0.4 01/26/2018 1355   GFRNONAA 50 (L) 01/26/2018 1355   GFRAA 58 (L) 01/26/2018 1355   Lab Results  Component Value Date   CHOL 110 01/26/2018   HDL 35 (L) 01/26/2018   LDLCALC 43 01/26/2018   LDLDIRECT 99.1 09/18/2008   TRIG 161 (H) 01/26/2018   CHOLHDL 3.1 01/26/2018   Lab Results  Component Value Date   HGBA1C 5.8 (H) 01/26/2018   No results found for: ZWCHENID78 Lab Results  Component Value Date   TSH 0.916 01/26/2018     ASSESSMENT AND PLAN 78 y.o. year old male  has a past medical history of ASHD (arteriosclerotic heart disease), COPD (chronic obstructive pulmonary disease) ([place]), Diabetes mellitus, Hemorrhoids, Hyperlipidemia, Hypertension, and Smoker. here with     ICD-10-CM   1. Parkinson's disease ([place])  G20   2. Basal ganglia infarction ([place])  I63.9   3. Bradycardia  R00.1     Mr. Dykstra feels that he is doing fairly well.  He does continue to have gait difficulty.  He has noted improvement with physical therapy in the past but refuses referral today.  He is navigating around his home with a walker.  No falls.  He does feel better taking carbidopa levodopa 3 times daily.  He feels that tremor is stable.  We will continue 3 times daily dosing.  We have discussed patient's heart rate.  He denies any symptoms.  He is adamant that his heart rate is usually in the low 40s.  I have instructed him that we will reach out to cardiology and notify them of today's findings.  He was educated on symptoms consistent with bradycardia and when to seek medical attention.  He and his brother both verbalized understanding.  He will continue Crestor, Plavix and aspirin for stroke prevention.  He will continue close follow-up with cardiology.  I have encouraged that they reach out to primary care for follow-up as I do not see this is been completed since 2018.  He will call as soon as possible.  He  request annual follow-up.  He was instructed to call sooner with any worsening of  symptoms.  He verbalizes understanding and agreement with this plan.   No orders of the defined types were placed in this encounter.    No orders of the defined types were placed in this encounter.     I spent 15 minutes with the patient. 50% of this time was spent counseling and educating patient on plan of care and medications.    Debbora Presto, FNP-C 10/07/2018, 4:23 PM [facility] [address] [place], [place] [id number] [phone]

## 2018-10-07 NOTE — Patient Instructions (Addendum)
Continue carbidopa/levodopa three times daily  Monitor for worsening symptoms  Consider PT therapy   Fall precautions  Follow up in 1 year  Parkinson's Disease Parkinson's disease is a type of movement disorder. It is a long-term condition that gets worse over time (is progressive). Each person with Parkinson's disease is affected differently. This condition limits your ability to control movements and move your body normally. The condition can range from mild to severe. Parkinson's disease tends to get worse slowly over several years. What are the causes? Parkinson's disease results from a loss of brain cells (neurons) that make a brain chemical called dopamine. Dopamine is needed to control movement. As the condition gets worse, neurons make less dopamine. This makes it hard to move or control your movements. The exact cause of the loss of neurons and why they make less dopamine is not known. Factors related to genes and the environment may contribute to the cause of Parkinson's disease. What increases the risk? The following factors may make you more likely to develop this condition:  Being male.  Being age 81 or older.  Having a family history of Parkinson's disease.  Having had a traumatic brain injury.  Having experienced depression.  Having been exposed to toxins, such as pesticides. What are the signs or symptoms? Symptoms of this condition can vary. The main symptoms are related to movement. These include:  A tremor or shaking while you are resting that you cannot control.  Stiffness in your neck, arms, and legs (rigidity).  Slowing of movement. You may lose facial expressions and have trouble making small movements that are needed to button clothing or brush your teeth.  An abnormal walk. You may walk with short, shuffling steps.  Loss of balance and stability when standing. You may sway, fall backward, and have trouble making turns. Other symptoms include:   Mental or cognitive changes including depression, anxiety, having false beliefs (delusions), or seeing, hearing, or feeling things that do not exist (hallucinations).  Trouble speaking or swallowing.  Changes in bowel or bladder functions including constipation, having to go urgently or frequently, or not being able to control your bowel or bladder.  Changes in sleep habits or trouble sleeping. Parkinson's disease may be graded by severity of your condition as mild, moderate, or advanced. Parkinson's disease progression is different for everyone. You may not progress to the advanced stage.  Mild Parkinson's disease involves: ? Movement problems that do not affect daily activities. ? Movement problems on one side of the body.  Moderate Parkinson's disease involves: ? Movement problems on both sides of the body. ? Slowing of movement. ? Coordination and balance problems.  Advanced Parkinson's disease involves: ? Extreme difficulty walking. ? Inability to live alone safely. ? Signs of dementia, such as having trouble remembering things, doing daily tasks such as getting dressed, and problem solving. How is this diagnosed? This condition is diagnosed by a specialist. A diagnosis may be made based on symptoms, your medical history, and a physical exam. You may also have brain imaging tests to check for a loss of dopamine-producing areas of the brain. How is this treated? There is no cure for Parkinson's disease. Treatment focuses on managing your symptoms. Treatment may include:  Medicines. Everyone responds to medicines differently. Your response may change over time. Work with your health care provider to find the best medicines for you.  Speech, occupational, and physical therapy.  Deep brain stimulation surgery to reduce tremors and other involuntary movements. Follow these  instructions at home: Medicines  Take over-the-counter and prescription medicines only as told by your health  care provider.  Avoid taking medicines that can affect thinking, such as pain or sleeping medicines. Eating and drinking  Follow instructions from your health care provider about eating or drinking restrictions.  Do not drink alcohol. Activity  Talk with your health care provider about if it is safe for you to drive.  Do exercises as told by your health care provider or physical therapist. Lifestyle      Install grab bars and railings in your home to prevent falls.  Do not use any products that contain nicotine or tobacco, such as cigarettes, e-cigarettes, and chewing tobacco. If you need help quitting, ask your health care provider.  Consider joining a support group for people with Parkinson's disease. General instructions  Work with your health care provider to determine what you need help with and what your safety needs are.  Keep all follow-up visits as told by your health care provider, including any visits with a physical therapist, speech therapist, or occupational therapist. This is important. Contact a health care provider if:  Medicines do not help your symptoms.  You are unsteady or have fallen at home.  You need more support to function well at home.  You have trouble swallowing.  You have severe constipation.  You are having problems with side effects from your medicines.  You feel confused, anxious, or depressed. Get help right away if you:  Are injured after a fall.  See or hear things that are not real.  Cannot swallow without choking.  Have chest pain or trouble breathing.  Do not feel safe at home.  Have thoughts about hurting yourself or others. If you ever feel like you may hurt yourself or others, or have thoughts about taking your own life, get help right away. You can go to your nearest emergency department or call:  Your local emergency services (911 in the [place]  A suicide crisis helpline, such as the [facility] at 806-429-8629. This is open 24 hours a day. Summary  Parkinson's disease is a long-term condition that gets worse over time. This condition limits your ability to control your movements and move your body normally.  There is no cure for Parkinson's disease. Treatment focuses on managing your symptoms.  Work with your health care provider to determine what you need help with and what your safety needs are.  Keep all follow-up visits as told by your health care provider, including any visits with a physical therapist, speech therapist, or occupational therapist. This is important. This information is not intended to replace advice given to you by your health care provider. Make sure you discuss any questions you have with your health care provider. Document Released: 02/15/2000 Document Revised: 05/06/2018 Document Reviewed: 05/06/2018 Elsevier Patient Education  [place] in the [facility], Adult Falls can cause injuries. They can happen to people of all ages. There are many things you can do to make your home safe and to help prevent falls. Ask for help when making these changes, if needed. What actions can I take to prevent falls? General Instructions  Use good lighting in all rooms. Replace any light bulbs that burn out.  Turn on the lights when you go into a dark area. Use night-lights.  Keep items that you use often in easy-to-reach places. Lower the shelves around your home if necessary.  Set up your  furniture so you have a clear path. Avoid moving your furniture around.  Do not have throw rugs and other things on the floor that can make you trip.  Avoid walking on wet floors.  If any of your floors are uneven, fix them.  Add color or contrast paint or tape to clearly mark and help you see: ? Any grab bars or handrails. ? First and last steps of stairways. ? Where the edge of each step is.  If you use a stepladder: ? Make sure that it is  fully opened. Do not climb a closed stepladder. ? Make sure that both sides of the stepladder are locked into place. ? Ask someone to hold the stepladder for you while you use it.  If there are any pets around you, be aware of where they are. What can I do in the bathroom?      Keep the floor dry. Clean up any water that spills onto the floor as soon as it happens.  Remove soap buildup in the tub or shower regularly.  Use non-skid mats or decals on the floor of the tub or shower.  Attach bath mats securely with double-sided, non-slip rug tape.  If you need to sit down in the shower, use a plastic, non-slip stool.  Install grab bars by the toilet and in the tub and shower. Do not use towel bars as grab bars. What can I do in the bedroom?  Make sure that you have a light by your bed that is easy to reach.  Do not use any sheets or blankets that are too big for your bed. They should not hang down onto the floor.  Have a firm chair that has side arms. You can use this for support while you get dressed. What can I do in the kitchen?  Clean up any spills right away.  If you need to reach something above you, use a strong step stool that has a grab bar.  Keep electrical cords out of the way.  Do not use floor polish or wax that makes floors slippery. If you must use wax, use non-skid floor wax. What can I do with my stairs?  Do not leave any items on the stairs.  Make sure that you have a light switch at the top of the stairs and the bottom of the stairs. If you do not have them, ask someone to add them for you.  Make sure that there are handrails on both sides of the stairs, and use them. Fix handrails that are broken or loose. Make sure that handrails are as long as the stairways.  Install non-slip stair treads on all stairs in your home.  Avoid having throw rugs at the top or bottom of the stairs. If you do have throw rugs, attach them to the floor with carpet tape.   Choose a carpet that does not hide the edge of the steps on the stairway.  Check any carpeting to make sure that it is firmly attached to the stairs. Fix any carpet that is loose or worn. What can I do on the outside of my home?  Use bright outdoor lighting.  Regularly fix the edges of walkways and driveways and fix any cracks.  Remove anything that might make you trip as you walk through a door, such as a raised step or threshold.  Trim any bushes or trees on the path to your home.  Regularly check to see if handrails are loose  or broken. Make sure that both sides of any steps have handrails.  Install guardrails along the edges of any raised decks and porches.  Clear walking paths of anything that might make someone trip, such as tools or rocks.  Have any leaves, snow, or ice cleared regularly.  Use sand or salt on walking paths during winter.  Clean up any spills in your garage right away. This includes grease or oil spills. What other actions can I take?  Wear shoes that: ? Have a low heel. Do not wear high heels. ? Have rubber bottoms. ? Are comfortable and fit you well. ? Are closed at the toe. Do not wear open-toe sandals.  Use tools that help you move around (mobility aids) if they are needed. These include: ? Canes. ? Walkers. ? Scooters. ? Crutches.  Review your medicines with your doctor. Some medicines can make you feel dizzy. This can increase your chance of falling. Ask your doctor what other things you can do to help prevent falls. Where to find more information  Centers for Disease Control and Prevention, [facility]: https://garcia.biz/  [employer] on Aging: BrainJudge.co.uk Contact a doctor if:  You are afraid of falling at home.  You feel weak, drowsy, or dizzy at home.  You fall at home. Summary  There are many simple things that you can do to make your home safe and to help prevent falls.  Ways to make your home safe include  removing tripping hazards and installing grab bars in the bathroom.  Ask for help when making these changes in your home. This information is not intended to replace advice given to you by your health care provider. Make sure you discuss any questions you have with your health care provider. Document Released: 12/14/2008 Document Revised: 06/10/2018 Document Reviewed: 10/02/2016 Elsevier Patient Education  2020 [employer].

## 2018-10-11 ENCOUNTER — Telehealth: Payer: Self-pay | Admitting: *Deleted

## 2018-10-11 DIAGNOSIS — E1169 Type 2 diabetes mellitus with other specified complication: Secondary | ICD-10-CM

## 2018-10-11 DIAGNOSIS — I2510 Atherosclerotic heart disease of native coronary artery without angina pectoris: Secondary | ICD-10-CM

## 2018-10-11 DIAGNOSIS — R001 Bradycardia, unspecified: Secondary | ICD-10-CM

## 2018-10-11 DIAGNOSIS — E785 Hyperlipidemia, unspecified: Secondary | ICD-10-CM

## 2018-10-11 NOTE — Telephone Encounter (Signed)
Notes/message from Dr. Harrington Challenger: Pt in neurology clinic last week  HR in 33s Pt denies dizziness,   No SOB   No CP Would like to set up for 48 hour holter monitor  Can that be mailed to him? Also:   Pt should have TSH, CBC, BMET and lipids     Spoke with patient.  He is aware Dr. Harrington Challenger wants him to get a 48 hr monitor. I informed of blood work. Will review with monitor tech to find out if he can get the monitor placed this week and then can have labs done same day. Pt aware I will call him back with this information. He can come any day this week except Friday

## 2018-10-11 NOTE — Telephone Encounter (Signed)

## 2018-10-11 NOTE — Telephone Encounter (Signed)
-----   Message from Fay Records, MD sent at 10/11/2018  3:50 PM EDT -----   ----- Message ----- From: Debbora Presto, NP Sent: 10/07/2018   4:26 PM EDT To: Fay Records, MD  Dr Audie Box saw Mr Culp in the office for follow up and noted HR of 35-40. He reports baseline low 40 readings over the past few years. I am unable to verify within Epic as most readings are 48-54. He reports follow up with you next month but I do not see this in Epic. Thank you!

## 2018-10-11 NOTE — Progress Notes (Signed)
Pt in neurology clinic last week  HR in 23s Pt denies dizziness,   No SOB   No CP Would like to set up for 48 hour holter monitor  Can that be mailed to him? Also:   Pt should have TSH, CBC, BMET and lipids

## 2018-10-12 ENCOUNTER — Other Ambulatory Visit: Payer: Medicare Other

## 2018-10-12 ENCOUNTER — Ambulatory Visit (INDEPENDENT_AMBULATORY_CARE_PROVIDER_SITE_OTHER): Payer: Medicare Other

## 2018-10-12 ENCOUNTER — Other Ambulatory Visit: Payer: Self-pay

## 2018-10-12 DIAGNOSIS — E1169 Type 2 diabetes mellitus with other specified complication: Secondary | ICD-10-CM | POA: Diagnosis not present

## 2018-10-12 DIAGNOSIS — R001 Bradycardia, unspecified: Secondary | ICD-10-CM

## 2018-10-12 DIAGNOSIS — I2510 Atherosclerotic heart disease of native coronary artery without angina pectoris: Secondary | ICD-10-CM | POA: Diagnosis not present

## 2018-10-12 DIAGNOSIS — E785 Hyperlipidemia, unspecified: Secondary | ICD-10-CM | POA: Diagnosis not present

## 2018-10-12 NOTE — Telephone Encounter (Signed)
Per message from monitor tech, pt can come in today at noon for zio patch to be applied.  He will also get labs.  Spoke with patient.  No change in questions re: screening from yesterday.

## 2018-10-13 LAB — LIPID PANEL
Chol/HDL Ratio: 2.9 ratio (ref 0.0–5.0)
Cholesterol, Total: 109 mg/dL (ref 100–199)
HDL: 38 mg/dL — ABNORMAL LOW (ref >39)
LDL Calculated: 49 mg/dL (ref 0–99)
Triglycerides: 112 mg/dL (ref 0–149)
VLDL Cholesterol Cal: 22 mg/dL (ref 5–40)

## 2018-10-13 LAB — CBC
Hematocrit: 46 % (ref 37.5–51.0)
Hemoglobin: 15.3 g/dL (ref 13.0–17.7)
MCH: 31 pg (ref 26.6–33.0)
MCHC: 33.3 g/dL (ref 31.5–35.7)
MCV: 93 fL (ref 79–97)
Platelets: 128 10*3/uL — ABNORMAL LOW (ref 150–450)
RBC: 4.93 x10E6/uL (ref 4.14–5.80)
RDW: 14.2 % (ref 11.6–15.4)
WBC: 7.7 10*3/uL (ref 3.4–10.8)

## 2018-10-13 LAB — BASIC METABOLIC PANEL
BUN/Creatinine Ratio: 11 (ref 10–24)
BUN: 15 mg/dL (ref 8–27)
CO2: 25 mmol/L (ref 20–29)
Calcium: 8.7 mg/dL (ref 8.6–10.2)
Chloride: 104 mmol/L (ref 96–106)
Creatinine, Ser: 1.37 mg/dL — ABNORMAL HIGH (ref 0.76–1.27)
GFR calc Af Amer: 58 mL/min/{1.73_m2} — ABNORMAL LOW (ref >59)
GFR calc non Af Amer: 50 mL/min/{1.73_m2} — ABNORMAL LOW (ref >59)
Glucose: 130 mg/dL — ABNORMAL HIGH (ref 65–99)
Potassium: 4.8 mmol/L (ref 3.5–5.2)
Sodium: 142 mmol/L (ref 134–144)

## 2018-10-13 LAB — TSH: TSH: 2.02 u[IU]/mL (ref 0.450–4.500)

## 2018-10-20 NOTE — Progress Notes (Signed)
I reviewed note and agree with plan.   Penni Bombard, MD 08/15/3792, 3:27 PM Certified in Neurology, Neurophysiology and Neuroimaging  [facility] [address] [place], [place] [address] [phone]

## 2018-10-22 DIAGNOSIS — R001 Bradycardia, unspecified: Secondary | ICD-10-CM | POA: Diagnosis not present

## 2018-11-24 ENCOUNTER — Other Ambulatory Visit: Payer: Self-pay | Admitting: Internal Medicine

## 2018-12-27 ENCOUNTER — Other Ambulatory Visit: Payer: Self-pay | Admitting: Internal Medicine

## 2018-12-28 ENCOUNTER — Encounter (INDEPENDENT_AMBULATORY_CARE_PROVIDER_SITE_OTHER): Payer: Self-pay

## 2019-01-07 ENCOUNTER — Telehealth: Payer: Self-pay

## 2019-01-07 ENCOUNTER — Other Ambulatory Visit: Payer: Self-pay | Admitting: Internal Medicine

## 2019-01-07 DIAGNOSIS — E1159 Type 2 diabetes mellitus with other circulatory complications: Secondary | ICD-10-CM

## 2019-01-07 MED ORDER — LISINOPRIL 10 MG PO TABS: 10.0000 mg | ORAL_TABLET | Freq: Every day | ORAL | 0 refills | Status: DC

## 2019-01-07 NOTE — Telephone Encounter (Signed)
Refill sent.

## 2019-01-31 ENCOUNTER — Other Ambulatory Visit: Payer: Self-pay | Admitting: *Deleted

## 2019-01-31 MED ORDER — ROSUVASTATIN CALCIUM 20 MG PO TABS: ORAL_TABLET | ORAL | 0 refills | Status: DC

## 2019-03-08 DIAGNOSIS — Z23 Encounter for immunization: Secondary | ICD-10-CM | POA: Diagnosis not present

## 2019-03-16 ENCOUNTER — Other Ambulatory Visit: Payer: Self-pay

## 2019-03-16 ENCOUNTER — Encounter: Payer: Self-pay | Admitting: Family Medicine

## 2019-03-16 ENCOUNTER — Ambulatory Visit (INDEPENDENT_AMBULATORY_CARE_PROVIDER_SITE_OTHER): Payer: Medicare Other | Admitting: Family Medicine

## 2019-03-16 VITALS — BP 150/90 | HR 52 | Temp 96.2°F | Wt 141.2 lb

## 2019-03-16 DIAGNOSIS — G20 Parkinson's disease: Secondary | ICD-10-CM

## 2019-03-16 DIAGNOSIS — J438 Other emphysema: Secondary | ICD-10-CM

## 2019-03-16 DIAGNOSIS — N1831 Chronic kidney disease, stage 3a: Secondary | ICD-10-CM

## 2019-03-16 DIAGNOSIS — G20A1 Parkinson's disease without dyskinesia, without mention of fluctuations: Secondary | ICD-10-CM

## 2019-03-16 DIAGNOSIS — Z9849 Cataract extraction status, unspecified eye: Secondary | ICD-10-CM

## 2019-03-16 DIAGNOSIS — D696 Thrombocytopenia, unspecified: Secondary | ICD-10-CM

## 2019-03-16 DIAGNOSIS — I2510 Atherosclerotic heart disease of native coronary artery without angina pectoris: Secondary | ICD-10-CM | POA: Diagnosis not present

## 2019-03-16 DIAGNOSIS — R269 Unspecified abnormalities of gait and mobility: Secondary | ICD-10-CM

## 2019-03-16 DIAGNOSIS — I69398 Other sequelae of cerebral infarction: Secondary | ICD-10-CM | POA: Diagnosis not present

## 2019-03-16 DIAGNOSIS — R001 Bradycardia, unspecified: Secondary | ICD-10-CM | POA: Diagnosis not present

## 2019-03-16 DIAGNOSIS — I10 Essential (primary) hypertension: Secondary | ICD-10-CM | POA: Diagnosis not present

## 2019-03-16 DIAGNOSIS — E785 Hyperlipidemia, unspecified: Secondary | ICD-10-CM | POA: Diagnosis not present

## 2019-03-16 DIAGNOSIS — B351 Tinea unguium: Secondary | ICD-10-CM

## 2019-03-16 DIAGNOSIS — I618 Other nontraumatic intracerebral hemorrhage: Secondary | ICD-10-CM

## 2019-03-16 DIAGNOSIS — E7439 Other disorders of intestinal carbohydrate absorption: Secondary | ICD-10-CM

## 2019-03-16 DIAGNOSIS — H9113 Presbycusis, bilateral: Secondary | ICD-10-CM | POA: Diagnosis not present

## 2019-03-16 DIAGNOSIS — I5043 Acute on chronic combined systolic (congestive) and diastolic (congestive) heart failure: Secondary | ICD-10-CM | POA: Diagnosis not present

## 2019-03-16 NOTE — Progress Notes (Signed)
Donald Richardson is a 79 y.o. male who presents for annual wellness visit and follow-up on chronic medical conditions.  He has no particular concerns or complaints other than difficulty with walking.Marland Kitchen  His brother helps take care of him.  He also has a caregiver that comes in 5 days a week for 8 hours.  He does have a history of Parkinson's disease and has had a CVA and is followed up by neurology.  He continues on Sinemet.  There are gait disturbances secondary to the CVA as well as Parkinson's.  He also has underlying heart disease and sees cardiology on a regular basis.  There is also history of COPD however he is not taking any medications for this.  He was diagnosed with diabetes in 2015 with hemoglobin A1c of 6.7 however since then his numbers have all been quite good with the most recent one today of 5.8.  He has had cataracts removed.  He also uses hearing aids.  He continues on lisinopril and has no cough with that.  He is taking Crestor with no aches or pains.  He has a history of thrombocytopenia and recent platelet counts have been stable.   Immunizations and Health Maintenance Immunization History  Administered Date(s) Administered  . Influenza Split 02/17/2011  . Influenza Whole 12/13/2008  . Influenza, High Dose Seasonal PF 01/09/2017  . Pneumococcal Conjugate-13 01/09/2017  . Pneumococcal Polysaccharide-23 04/10/2008, 10/17/2013  . Tdap 06/03/2007  . Zoster 06/19/2010   Health Maintenance Due  Topic Date Due  . FOOT EXAM  06/26/2015  . OPHTHALMOLOGY EXAM  08/16/2016  . TETANUS/TDAP  06/02/2017  . HEMOGLOBIN A1C  07/27/2018  . INFLUENZA VACCINE  10/02/2018    Last colonoscopy:N/A Last PSA: Dentist:No Ophtho:? Exercise: Uses a walker.  Other doctors caring for patient include: Dr. Harrington Challenger.  Dr. Leta Baptist.  Advanced Directives: No.  Copy requested.    Depression screen:  See questionnaire below.     Depression screen [facility] 2/9 12/30/2017 09/19/2016 07/18/2016 09/18/2015  02/27/2014  Decreased Interest 0 0 0 0 0  Down, Depressed, Hopeless 0 0 0 0 0  PHQ - 2 Score 0 0 0 0 0  Altered sleeping - - 0 - -  Tired, decreased energy - - 0 - -  Change in appetite - - 0 - -  Feeling bad or failure about yourself  - - 0 - -  Trouble concentrating - - 0 - -  Moving slowly or fidgety/restless - - 0 - -  Suicidal thoughts - - 0 - -  PHQ-9 Score - - 0 - -  Difficult doing work/chores - - Not difficult at all - -    Fall Screen: See Questionaire below.   Fall Risk  12/30/2017 09/19/2016 07/18/2016 09/18/2015 02/27/2014  Falls in the past year? No Yes Yes No No  Comment - - Last Fall was Stroke April 2018 - -  Number falls in past yr: - 2 or more 2 or more - -  Injury with Fall? - Yes Yes - -  Risk Factor Category  - High Fall Risk - - -  Risk for fall due to : - Other (Comment) - - -  Risk for fall due to: Comment - from CVA - - -    ADL screen:  See questionnaire below.  Functional Status Survey:     Review of Systems  Constitutional: -, -unexpected weight change, -anorexia, -fatigue Allergy: -sneezing, -itching, -congestion Dermatology: denies changing moles, rash, lumps  Cardiology:  -chest pain, -palpitations, -orthopnea, Respiratory: -cough, -shortness of breath, -dyspnea on exertion, -wheezing,  Gastroenterology: -abdominal pain, -nausea, -vomiting, -diarrhea, -constipation, -dysphagia Hematology: -bleeding or bruising problems Neurology: -, -numbness, , -memory loss, -falls, -dizziness    PHYSICAL EXAM:   General Appearance: Alert, cooperative, no distress, appears stated age Head: Normocephalic, without obvious abnormality, atraumatic Eyes: PERRL, conjunctiva/corneas clear, EOM's intact, Ears: Normal TM's and external ear canals Nose: Nares normal, mucosa normal, no drainage or sinus   tenderness Throat: Lips, mucosa, and tongue normal; teeth show evidence of decay gums normal Neck: Supple, no lymphadenopathy, thyroid:no  enlargement/tenderness/nodules; no carotid bruit or JVD Lungs: Clear to auscultation bilaterally without wheezes, rales or ronchi; respirations unlabored Heart: Bradycardia noted S1 and S2 normal, no murmur, rub or gallop Extremities: No clubbing, cyanosis or edema.all nails show thickening.  Pulses: 2+ and symmetric all extremities Skin: Skin color, texture, turgor normal, no rashes or lesions Lymph nodes: Cervical, supraclavicular, and axillary nodes normal Neurologic: CNII-XII intact, normal strength, sensation and gait; reflexes 2+ and symmetric throughout   Psych: Normal mood, affect, poor hygiene and grooming  ASSESSMENT/PLAN: Acute on chronic combined systolic and diastolic CHF (congestive heart failure) (HCC)  Atherosclerosis of native coronary artery of native heart without angina pectoris  Glucose intolerance  Gait disturbance, post-stroke  Parkinson's disease ([place])  History of cataract extraction, unspecified laterality  Bradycardia  Stage 3a chronic kidney disease  Hyperlipidemia, unspecified hyperlipidemia type  Essential hypertension  Thrombocytopenia (HCC)  Presbycusis of both ears  Onychomycosis  Other emphysema ([place])  Coronary artery disease involving native coronary artery of native heart without angina pectoris  Other nontraumatic intracerebral hemorrhage, unspecified laterality ([place]) He is quite happy being at home and having a caregiver come in.  He will continue to follow-up with cardiology as well as neurology.  Discussed treatment of the onychomycosis and he is fine not doing any major intervention.  His brother is also comfortable with that.  Continue on his present medications.  Immunization recommendations discussed.  Colonoscopy recommendations reviewed.   Medicare Attestation I have personally reviewed: The patient's medical and social history Their use of alcohol, tobacco or illicit drugs Their current medications and supplements The  patient's functional ability including ADLs,fall risks, home safety risks, cognitive, and hearing and visual impairment Diet and physical activities Evidence for depression or mood disorders  The patient's weight, height, and BMI have been recorded in the chart.  I have made referrals, counseling, and provided education to the patient based on review of the above and I have provided the patient with a written personalized care plan for preventive services.     Jill Alexanders, MD   03/16/2019    Donald Richardson is a 79 y.o. male who presents for annual wellness visit and follow-up on chronic medical conditions.  He has the following concerns:   Immunizations and Health Maintenance Immunization History  Administered Date(s) Administered  . Influenza Split 02/17/2011  . Influenza Whole 12/13/2008  . Influenza, High Dose Seasonal PF 01/09/2017  . Pneumococcal Conjugate-13 01/09/2017  . Pneumococcal Polysaccharide-23 04/10/2008, 10/17/2013  . Tdap 06/03/2007  . Zoster 06/19/2010   Health Maintenance Due  Topic Date Due  . FOOT EXAM  06/26/2015  . OPHTHALMOLOGY EXAM  08/16/2016  . TETANUS/TDAP  06/02/2017  . HEMOGLOBIN A1C  07/27/2018  . INFLUENZA VACCINE  10/02/2018    Last colonoscopy: 09/16/2007 Last PSA: 01/26/18 Dentist: over a year Ophtho: over year Exercise:walking around  Other doctors caring for patient include: Dr.Ross  cardio, [facility], Dr. [facility]  Advanced Directives: Not on file    Depression screen:  See questionnaire below.     Depression screen [facility] 2/9 12/30/2017 09/19/2016 07/18/2016 09/18/2015 02/27/2014  Decreased Interest 0 0 0 0 0  Down, Depressed, Hopeless 0 0 0 0 0  PHQ - 2 Score 0 0 0 0 0  Altered sleeping - - 0 - -  Tired, decreased energy - - 0 - -  Change in appetite - - 0 - -  Feeling bad or failure about yourself  - - 0 - -  Trouble concentrating - - 0 - -  Moving slowly or fidgety/restless - - 0 - -  Suicidal thoughts - - 0 - -  PHQ-9 Score  - - 0 - -  Difficult doing work/chores - - Not difficult at all - -    Fall Screen: See Questionaire below.   Fall Risk  12/30/2017 09/19/2016 07/18/2016 09/18/2015 02/27/2014  Falls in the past year? No Yes Yes No No  Comment - - Last Fall was Stroke April 2018 - -  Number falls in past yr: - 2 or more 2 or more - -  Injury with Fall? - Yes Yes - -  Risk Factor Category  - High Fall Risk - - -  Risk for fall due to : - Other (Comment) - - -  Risk for fall due to: Comment - from CVA - - -    ADL screen:  See questionnaire below.  Functional Status Survey:     Review of Systems  Constitutional: -, -unexpected weight change, -anorexia, -fatigue Allergy: -sneezing, -itching, -congestion Dermatology: denies changing moles, rash, lumps ENT: -runny nose, -ear pain, -sore throat,  Cardiology:  -chest pain, -palpitations, -orthopnea, Respiratory: -cough, -shortness of breath, -dyspnea on exertion, -wheezing,  Gastroenterology: -abdominal pain, -nausea, -vomiting, -diarrhea, -constipation, -dysphagia Hematology: -bleeding or bruising problems Musculoskeletal: -arthralgias, -myalgias, -joint swelling, -back pain, - Ophthalmology: -vision changes,  Urology: -dysuria, -difficulty urinating,  -urinary frequency, -urgency, incontinence Neurology: -, -numbness, , -memory loss, -falls, -dizziness    PHYSICAL EXAM:  There were no vitals taken for this visit.  General Appearance: Alert, cooperative, no distress, appears stated age Head: Normocephalic, without obvious abnormality, atraumatic Eyes: PERRL, conjunctiva/corneas clear, EOM's intact, fundi benign Ears: Normal TM's and external ear canals Nose: Nares normal, mucosa normal, no drainage or sinus   tenderness Throat: Lips, mucosa, and tongue normal; teeth and gums normal Neck: Supple, no lymphadenopathy, thyroid:no enlargement/tenderness/nodules; no carotid bruit or JVD Lungs: Clear to auscultation bilaterally without wheezes, rales  or ronchi; respirations unlabored Heart: Regular rate and rhythm, S1 and S2 normal, no murmur, rub or gallop Abdomen: Soft, non-tender, nondistended, normoactive bowel sounds, no masses, no hepatosplenomegaly Extremities: No clubbing, cyanosis or edema Pulses: 2+ and symmetric all extremities Skin: Skin color, texture, turgor normal, no rashes or lesions Lymph nodes: Cervical, supraclavicular, and axillary nodes normal Neurologic: CNII-XII intact, normal strength, sensation and gait; reflexes 2+ and symmetric throughout   Psych: Normal mood, affect, hygiene and grooming  ASSESSMENT/PLAN:    Discussed PSA screening (risks/benefits), recommended at least 30 minutes of aerobic activity at least 5 days/week; proper sunscreen use reviewed; healthy diet and alcohol recommendations (less than or equal to 2 drinks/day) reviewed; regular seatbelt use; changing batteries in smoke detectors. Immunization recommendations discussed.  Colonoscopy recommendations reviewed.   Medicare Attestation I have personally reviewed: The patient's medical and social history Their use of alcohol, tobacco or illicit drugs Their current medications  and supplements The patient's functional ability including ADLs,fall risks, home safety risks, cognitive, and hearing and visual impairment Diet and physical activities Evidence for depression or mood disorders  The patient's weight, height, and BMI have been recorded in the chart.  I have made referrals, counseling, and provided education to the patient based on review of the above and I have provided the patient with a written personalized care plan for preventive services.     Jill Alexanders, MD   03/16/2019

## 2019-04-04 ENCOUNTER — Telehealth: Payer: Self-pay | Admitting: Family Medicine

## 2019-04-04 NOTE — Telephone Encounter (Signed)
Pt dropped of power of attorney l forms put in your folder for review

## 2019-04-11 ENCOUNTER — Ambulatory Visit: Payer: Medicare Other | Attending: Internal Medicine

## 2019-04-11 DIAGNOSIS — Z23 Encounter for immunization: Secondary | ICD-10-CM | POA: Insufficient documentation

## 2019-04-11 NOTE — Progress Notes (Signed)
   Covid-19 Vaccination Clinic  Name:  Donald Richardson    MRN: [id number] DOB: 1941-02-22  04/11/2019  Mr. Weghorst was observed post Covid-19 immunization for 15 minutes without incidence. He was provided with Vaccine Information Sheet and instruction to access the V-Safe system.   Mr. Walicki was instructed to call 911 with any severe reactions post vaccine: Marland Kitchen Difficulty breathing  . Swelling of your face and throat  . A fast heartbeat  . A bad rash all over your body  . Dizziness and weakness    Immunizations Administered    Name Date Dose VIS Date Route   Pfizer COVID-19 Vaccine 04/11/2019  3:10 PM 0.3 mL 02/11/2019 Intramuscular   Manufacturer: [place]   Lot: [id number]   [place]: [id number]

## 2019-04-12 ENCOUNTER — Other Ambulatory Visit: Payer: Self-pay | Admitting: *Deleted

## 2019-04-12 DIAGNOSIS — E1159 Type 2 diabetes mellitus with other circulatory complications: Secondary | ICD-10-CM

## 2019-04-12 MED ORDER — LISINOPRIL 10 MG PO TABS: 10.0000 mg | ORAL_TABLET | Freq: Every day | ORAL | 2 refills | Status: DC

## 2019-04-27 ENCOUNTER — Telehealth: Payer: Self-pay | Admitting: Internal Medicine

## 2019-04-27 MED ORDER — CLOPIDOGREL BISULFATE 75 MG PO TABS: 75.0000 mg | ORAL_TABLET | Freq: Every day | ORAL | 3 refills | Status: DC

## 2019-04-27 NOTE — Telephone Encounter (Signed)
Called in Refill for Plavix 75 mg daily    90 day supply    Refill 3times

## 2019-04-27 NOTE — Addendum Note (Signed)
Addended by: Rodman Key on: 04/27/2019 04:29 PM   Modules accepted: Orders

## 2019-04-27 NOTE — Telephone Encounter (Signed)
Patient called in to say he needs refill on carbidopa levodopa 25 - 100      Take 1 table 3x daily  Fill 90 days     With 3 refills

## 2019-04-28 ENCOUNTER — Other Ambulatory Visit: Payer: Self-pay

## 2019-04-28 MED ORDER — CARBIDOPA-LEVODOPA 25-100 MG PO TABS: 1.0000 | ORAL_TABLET | Freq: Three times a day (TID) | ORAL | 3 refills | Status: DC

## 2019-05-05 ENCOUNTER — Other Ambulatory Visit: Payer: Self-pay | Admitting: *Deleted

## 2019-05-05 MED ORDER — ROSUVASTATIN CALCIUM 20 MG PO TABS: ORAL_TABLET | ORAL | 3 refills | Status: DC

## 2019-05-07 ENCOUNTER — Ambulatory Visit: Payer: Medicare Other | Attending: Internal Medicine

## 2019-05-07 DIAGNOSIS — Z23 Encounter for immunization: Secondary | ICD-10-CM | POA: Insufficient documentation

## 2019-05-07 NOTE — Progress Notes (Signed)
   Covid-19 Vaccination Clinic  Name:  Donald Richardson    MRN: [id number] DOB: [birth year]  05/07/2019  Donald Richardson was observed post Covid-19 immunization for 15 minutes without incident. He was provided with Vaccine Information Sheet and instruction to access the V-Safe system.   Donald Richardson was instructed to call 911 with any severe reactions post vaccine: Marland Kitchen Difficulty breathing  . Swelling of face and throat  . A fast heartbeat  . A bad rash all over body  . Dizziness and weakness   Immunizations Administered    Name Date Dose VIS Date Route   Pfizer COVID-19 Vaccine 05/07/2019 12:58 PM 0.3 mL 02/11/2019 Intramuscular   Manufacturer: [place]   Lot: [id number]   [place]: [id number]

## 2019-06-09 ENCOUNTER — Ambulatory Visit (INDEPENDENT_AMBULATORY_CARE_PROVIDER_SITE_OTHER): Payer: Medicare Other | Admitting: Physician Assistant

## 2019-06-09 ENCOUNTER — Other Ambulatory Visit: Payer: Self-pay

## 2019-06-09 VITALS — BP 118/60 | HR 62 | Ht 68.0 in | Wt 141.0 lb

## 2019-06-09 DIAGNOSIS — E1159 Type 2 diabetes mellitus with other circulatory complications: Secondary | ICD-10-CM | POA: Diagnosis not present

## 2019-06-09 DIAGNOSIS — R001 Bradycardia, unspecified: Secondary | ICD-10-CM | POA: Diagnosis not present

## 2019-06-09 DIAGNOSIS — I10 Essential (primary) hypertension: Secondary | ICD-10-CM

## 2019-06-09 DIAGNOSIS — E785 Hyperlipidemia, unspecified: Secondary | ICD-10-CM

## 2019-06-09 DIAGNOSIS — I2510 Atherosclerotic heart disease of native coronary artery without angina pectoris: Secondary | ICD-10-CM | POA: Diagnosis not present

## 2019-06-09 DIAGNOSIS — E1169 Type 2 diabetes mellitus with other specified complication: Secondary | ICD-10-CM

## 2019-06-09 DIAGNOSIS — I152 Hypertension secondary to endocrine disorders: Secondary | ICD-10-CM

## 2019-06-09 NOTE — Progress Notes (Signed)
Cardiology Office Note    Date:  06/09/2019   ID:  Donald Richardson, DOB [birth year], MRN [id number]  PCP:  Denita Lung, MD  Cardiologist:  Dr. Harrington Challenger  Chief Complaint: Re-establised cardiac care  History of Present Illness:   Donald Richardson is a 79 y.o. male CAD, HTN, HLD, DM, Ischemic CVA (07/2016 - started DAPT) and parkinson's disease presented for long due follow up.   He presented with [place] fall 2011. He r/o for MI He underwent cardiac cath that showe: 60% prox LAD; 65% mid LAD lesion); 99% OM2; 90% instent restenosis of the RCA. 80% distal RCA; 60% PLSA. He underwent PTCA/DES to the OM and PTCA/DES x 2 to the RCA.  Noted heart rate of 30s during neurology visit August 2020.  Dr. Harrington Challenger who has not seen the patient since 2017 order monitor which is as below: Sinus rhythm   Rates 30 to 148 bpm Occasional PVCs, PACs   Short burst of SVT, 7 beats NSVT NO pauses No symptoms  Here today for follow-up of with caregiver.  He lives by himself.  He has Parkinson disease and use walker.  No falls or syncope.  He has a caregiver companion for 5 days/week.  He denies chest pain, shortness of breath, orthopnea, PND, syncope, dizziness, lower extremity edema, palpitation or melena.  Compliant with his medication.  He does not take any medication for diabetes.    Past Medical History:  Diagnosis Date  . ASHD (arteriosclerotic heart disease)    STENT  . COPD (chronic obstructive pulmonary disease) ([place])   . Diabetes mellitus    Hgb A1C- 5.8 in May 2017  . Hemorrhoids   . Hyperlipidemia   . Hypertension   . Smoker     Past Surgical History:  Procedure Laterality Date  . INGUINAL HERNIA REPAIR Left 10/26/2015   Procedure: LEFT INGUINAL HERNIA REPAIR WITH MESH;  Surgeon: Jackolyn Confer, MD;  Location: [facility];  Service: General;  Laterality: Left;  . INSERTION OF MESH Left 10/26/2015   Procedure: INSERTION OF MESH;  Surgeon: Jackolyn Confer, MD;  Location: [facility];  Service: General;   Laterality: Left;  . NO PAST SURGERIES      Current Medications: Prior to Admission medications   Medication Sig Start Date End Date Taking? Authorizing Provider  acetaminophen (TYLENOL) 500 MG tablet Take 1,000 mg by mouth every 6 (six) hours as needed for mild pain.    [provider]  aspirin 325 MG EC tablet Take 1 tablet (325 mg total) by mouth daily. 01/07/18   Fay Records, MD  carbidopa-levodopa (SINEMET IR) 25-100 MG tablet Take 1 tablet by mouth 3 (three) times daily. 04/28/19   Fay Records, MD  clopidogrel (PLAVIX) 75 MG tablet Take 1 tablet (75 mg total) by mouth daily. 04/27/19   Fay Records, MD  lisinopril (ZESTRIL) 10 MG tablet Take 1 tablet (10 mg total) by mouth daily. 04/12/19   Fay Records, MD  rosuvastatin (CRESTOR) 20 MG tablet TAKE 1 TABLET(20 MG) BY MOUTH AT BEDTIME 05/05/19   Fay Records, MD    Allergies:   Patient has no known allergies.   Social History   Socioeconomic History  . Marital status: Single    Spouse name: Not on file  . Number of children: Not on file  . Years of education: Not on file  . Highest education level: Not on file  Occupational History  . Not on file  Tobacco  Use  . Smoking status: Former Smoker    Packs/day: 1.00    Types: Cigarettes    Quit date: 06/20/2016    Years since quitting: 2.9  . Smokeless tobacco: Never Used  Substance and Sexual Activity  . Alcohol use: No  . Drug use: No  . Sexual activity: Not Currently  Other Topics Concern  . Not on file  Social History Narrative  . Not on file   Social Determinants of Health   Financial Resource Strain:   . Difficulty of Paying Living Expenses:   Food Insecurity:   . Worried About Charity fundraiser in the Last Year:   . Arboriculturist in the Last Year:   Transportation Needs:   . Film/video editor (Medical):   Marland Kitchen Lack of Transportation (Non-Medical):   Physical Activity:   . Days of Exercise per Week:   . Minutes of Exercise per Session:     Stress:   . Feeling of Stress :   Social Connections:   . Frequency of Communication with Friends and Family:   . Frequency of Social Gatherings with Friends and Family:   . Attends Religious Services:   . Active Member of Clubs or Organizations:   . Attends Archivist Meetings:   Marland Kitchen Marital Status:      Family History:  The patient's family history includes Breast cancer in his mother; Parkinson's disease in his father.   ROS:   Please see the history of present illness.    ROS All other systems reviewed and are negative.   PHYSICAL EXAM:   VS:  BP 118/60   Pulse 62   Ht 5\' 8"  (1.727 m)   Wt 141 lb (64 kg)   SpO2 95%   BMI 21.44 kg/m    GEN: Well nourished, well developed, in no acute distress  HEENT: normal  Neck: no JVD, carotid bruits, or masses Cardiac: RRR; no murmurs, rubs, or gallops,no edema  Respiratory:  clear to auscultation bilaterally, normal work of breathing GI: soft, nontender, nondistended, + BS MS: no deformity or atrophy  Skin: warm and dry, no rash Neuro:  Alert and Oriented x 3, resting tremors  Psych: euthymic mood, full affect  Wt Readings from Last 3 Encounters:  06/09/19 141 lb (64 kg)  03/16/19 141 lb 3.2 oz (64 kg)  10/07/18 136 lb 12.8 oz (62.1 kg)      Studies/Labs Reviewed:   EKG:  EKG is ordered today.  The ekg ordered today demonstrates normal sinus rhythm with right bundle branch block  Recent Labs: 10/12/2018: BUN 15; Creatinine, Ser 1.37; Hemoglobin 15.3; Platelets 128; Potassium 4.8; Sodium 142; TSH 2.020   Lipid Panel    Component Value Date/Time   CHOL 109 10/12/2018 1145   TRIG 112 10/12/2018 1145   HDL 38 (L) 10/12/2018 1145   CHOLHDL 2.9 10/12/2018 1145   CHOLHDL 3.0 06/21/2016 0529   VLDL 14 06/21/2016 0529   LDLCALC 49 10/12/2018 1145   LDLDIRECT 99.1 09/18/2008 0000    Additional studies/ records that were reviewed today include:   Echocardiogram: 06/2016 Study Conclusions   - Left ventricle:  The cavity size was normal. Wall thickness was  increased in a pattern of moderate LVH. Systolic function was  normal. The estimated ejection fraction was in the range of 50%  to 55%. There is hypokinesis of the mid-apicalinferior  myocardium. Features are consistent with a pseudonormal left  ventricular filling pattern, with concomitant abnormal relaxation  and increased filling pressure (grade 2 diastolic dysfunction).  - Aortic root: The aortic root was mildly dilated.  - Pulmonary arteries: Systolic pressure was mildly increased. PA  peak pressure: 37 mm Hg (S).   Impressions:   - Pt bradycardic during the study with HR high 30s and low 40s.  Hypokinesis of the distal inferior wall with overall preserved LV  systolic function; moderate LVH; grade 2 diastolic dysfunction;  mildly dilated aortic root; mild TR with mildly elevated  pulmonary pressure.     ASSESSMENT & PLAN:    1. CAD No angina.  He has remained on dual antiplatelet therapy with aspirin and Plavix 2nd to CVA in 2018. Continue Crestor.   2. HTN - BP stable on current medications  3. HLD - LDL 49 on 10/2018. Continue Crestor 20.  4. DM - no longer on medications.   5. Hx of bradycardia - No reoccurrence. Not on any AV blocking agent.   Medication Adjustments/Labs and Tests Ordered: Current medicines are reviewed at length with the patient today.  Concerns regarding medicines are outlined above.  Medication changes, Labs and Tests ordered today are listed in the Patient Instructions below. Patient Instructions  Medication Instructions:  Your physician recommends that you continue on your current medications as directed. Please refer to the Current Medication list given to you today.  *If you need a refill on your cardiac medications before your next appointment, please call your pharmacy*   Lab Work: None ordered  If you have labs (blood work) drawn today and your tests are  completely normal, you will receive your results only by: Marland Kitchen MyChart Message (if you have MyChart) OR . A paper copy in the mail If you have any lab test that is abnormal or we need to change your treatment, we will call you to review the results.   Testing/Procedures: None ordered   Follow-Up: At [facility], you and your health needs are our priority.  As part of our continuing mission to provide you with exceptional heart care, we have created designated Provider Care Teams.  These Care Teams include your primary Cardiologist (physician) and Advanced Practice Providers (APPs -  Physician Assistants and Nurse Practitioners) who all work together to provide you with the care you need, when you need it.  We recommend signing up for the patient portal called "MyChart".  Sign up information is provided on this After Visit Summary.  MyChart is used to connect with patients for Virtual Visits (Telemedicine).  Patients are able to view lab/test results, encounter notes, upcoming appointments, etc.  Non-urgent messages can be sent to your provider as well.   To learn more about what you can do with MyChart, go to [email or web address].    Your next appointment:   As Needed  The format for your next appointment:   In Person  Provider:   You may see Dr. Dorris Carnes or one of the following Advanced Practice Providers on your designated Care Team:    Richardson Dopp, PA-C  [place], [place]  Daune Perch, NP    Other Instructions      Signed, Leanor Kail, [place]  06/09/2019 3:50 PM    [address], [place], [place]  [address] Phone: [phone]; Fax: [phone]

## 2019-06-09 NOTE — Patient Instructions (Signed)
Medication Instructions:  Your physician recommends that you continue on your current medications as directed. Please refer to the Current Medication list given to you today.  *If you need a refill on your cardiac medications before your next appointment, please call your pharmacy*   Lab Work: None ordered  If you have labs (blood work) drawn today and your tests are completely normal, you will receive your results only by: Marland Kitchen MyChart Message (if you have MyChart) OR . A paper copy in the mail If you have any lab test that is abnormal or we need to change your treatment, we will call you to review the results.   Testing/Procedures: None ordered   Follow-Up: At [facility], you and your health needs are our priority.  As part of our continuing mission to provide you with exceptional heart care, we have created designated Provider Care Teams.  These Care Teams include your primary Cardiologist (physician) and Advanced Practice Providers (APPs -  Physician Assistants and Nurse Practitioners) who all work together to provide you with the care you need, when you need it.  We recommend signing up for the patient portal called "MyChart".  Sign up information is provided on this After Visit Summary.  MyChart is used to connect with patients for Virtual Visits (Telemedicine).  Patients are able to view lab/test results, encounter notes, upcoming appointments, etc.  Non-urgent messages can be sent to your provider as well.   To learn more about what you can do with MyChart, go to [email or web address].    Your next appointment:   As Needed  The format for your next appointment:   In Person  Provider:   You may see Dr. Dorris Carnes or one of the following Advanced Practice Providers on your designated Care Team:    Richardson Dopp, PA-C  [place], [place]  Daune Perch, NP    Other Instructions

## 2019-10-13 ENCOUNTER — Encounter: Payer: Self-pay | Admitting: Family Medicine

## 2019-10-13 ENCOUNTER — Ambulatory Visit (INDEPENDENT_AMBULATORY_CARE_PROVIDER_SITE_OTHER): Payer: Medicare Other | Admitting: Family Medicine

## 2019-10-13 VITALS — BP 150/86 | HR 55 | Ht 68.0 in | Wt 132.0 lb

## 2019-10-13 DIAGNOSIS — I639 Cerebral infarction, unspecified: Secondary | ICD-10-CM | POA: Diagnosis not present

## 2019-10-13 DIAGNOSIS — G20 Parkinson's disease: Secondary | ICD-10-CM

## 2019-10-13 DIAGNOSIS — I6381 Other cerebral infarction due to occlusion or stenosis of small artery: Secondary | ICD-10-CM

## 2019-10-13 NOTE — Progress Notes (Signed)
PATIENT: Donald Richardson DOB: 11/23/1940  REASON FOR VISIT: follow up HISTORY FROM: patient  Chief Complaint  Patient presents with  . Follow-up    60yr f/u, states he has been doing well since last visit.   Marland Kitchen room 7    with Donald Richardson     HISTORY OF PRESENT ILLNESS: Today 10/13/19 Donald Richardson is a 79 y.o. male here today for follow up for PD. He continues Sinemet 1 tablet TID. He occasionally takes a 4th tablet and feels that the extra dose helps with mobility. He is sleeping well. He usually goes to bed around 1am and wakes at 12p. He is eating and drinking normally. No changes in strength or gait. He uses his walker at home. He is in wheelchair today. No falls. effery with [facility] comes to assist him M,T,W,Th, and Sa. He is able to perform ADL's independently. Donald Richardson assists with household chores and meals. Donald Richardson is able to dose his own medications.   HISTORY: (copied from my note on 10/07/2018)  Donald Richardson is a 79 y.o. male here today for follow up of prior ischemic CVA in 2018 and PD. He continues carb/levo 25-100 TID. He reports that he decreased dose on his because his hand tremors were improved. He has not had any falls. He is able to navigate around the home with walker. He is taking Crestor 20mg , Plavix 75mg  and asa 325mg  daily. He has regular folow up with cardiology. He is scheduling appt with PCP soon. Last A1C was 5.8. Creatinine 1.37.   Patient's heart rate in the office is 34 to 40 bpm.  He and his brother both report that baseline heart rate has been in the 140s recently.  He is followed by cardiology regularly.  He denies any symptoms of dizziness, lightheadedness or excessive fatigue.  HISTORY: (copied from Donald Richardson note on 09/01/2017)  UPDATE (09/01/17, VRP): Since last visit, doingwell. Toleratingmeds. No alleviating or aggravating factors.Tremor is stable (taking carb/levo 4 x per day).  UPDATE (02/20/17, VRP): Since last visit, doing  better with tremor. Tolerating carb/levo -->helping with tremor. Gait is still poor. No alleviating or aggravating factors. Not driving much anymore.   PRIOR HPI (11/21/16) 79 year old male here for evaluation of stroke hospital discharge. Also requested for evaluation of possible Parkinson's disease. 06/20/16 patient was admitted to the hospital due to gait and balance difficulties, multiple falls. Symptoms lasted for 2 days before patient went to the hospital. He was diagnosed with right brain subcortical ischemic infarction. Stroke workup was completed. He was exposed to follow-up in neurology clinic earlier after his discharge. Since that time patient had further balance and gait difficulty, with a severe fall in June resulting in several fractures. Now patient having significant difficulty with his activities of daily living. He lives alone but has a caregiver come every day for 8 hours. His brother lives in [place] but visits him once a week. Also of note patient has had at least 10-15 year history of progressive resting tremor, stooped posture, shuffling gait, according to patient's brother. Patient feels like he has had right hand tremor for almost 30 years. Patient's father had Parkinson's disease.   REVIEW OF SYSTEMS: Out of a complete 14 system review of symptoms, the patient complains only of the following symptoms, weakness, and all other reviewed systems are negative.  ALLERGIES: No Known Allergies  HOME MEDICATIONS: Outpatient Medications Prior to Visit  Medication Sig Dispense Refill  .  acetaminophen (TYLENOL) 500 MG tablet Take 1,000 mg by mouth every 6 (six) hours as needed for mild pain.    Marland Kitchen aspirin 325 MG EC tablet Take 1 tablet (325 mg total) by mouth daily. 90 tablet 0  . carbidopa-levodopa (SINEMET IR) 25-100 MG tablet Take 1 tablet by mouth 3 (three) times daily. 180 tablet 3  . clopidogrel (PLAVIX) 75 MG tablet Take 1 tablet (75 mg total) by mouth daily. 90  tablet 3  . lisinopril (ZESTRIL) 10 MG tablet Take 1 tablet (10 mg total) by mouth daily. 90 tablet 2  . rosuvastatin (CRESTOR) 20 MG tablet TAKE 1 TABLET(20 MG) BY MOUTH AT BEDTIME 90 tablet 3   No facility-administered medications prior to visit.    PAST MEDICAL HISTORY: Past Medical History:  Diagnosis Date  . ASHD (arteriosclerotic heart disease)    STENT  . COPD (chronic obstructive pulmonary disease) ([place])   . Diabetes mellitus    Hgb A1C- 5.8 in May 2017  . Hemorrhoids   . Hyperlipidemia   . Hypertension   . Smoker     PAST SURGICAL HISTORY: Past Surgical History:  Procedure Laterality Date  . INGUINAL HERNIA REPAIR Left 10/26/2015   Procedure: LEFT INGUINAL HERNIA REPAIR WITH MESH;  Surgeon: Jackolyn Confer, MD;  Location: [facility];  Service: General;  Laterality: Left;  . INSERTION OF MESH Left 10/26/2015   Procedure: INSERTION OF MESH;  Surgeon: Jackolyn Confer, MD;  Location: [facility];  Service: General;  Laterality: Left;  . NO PAST SURGERIES      FAMILY HISTORY: Family History  Problem Relation Age of Onset  . Breast cancer Mother   . Parkinson's disease Father     SOCIAL HISTORY: Social History   Socioeconomic History  . Marital status: Single    Spouse name: Not on file  . Number of children: Not on file  . Years of education: Not on file  . Highest education level: Not on file  Occupational History  . Not on file  Tobacco Use  . Smoking status: Former Smoker    Packs/day: 1.00    Types: Cigarettes    Quit date: 06/20/2016    Years since quitting: 3.3  . Smokeless tobacco: Never Used  Vaping Use  . Vaping Use: Never used  Substance and Sexual Activity  . Alcohol use: No  . Drug use: No  . Sexual activity: Not Currently  Other Topics Concern  . Not on file  Social History Narrative  . Not on file   Social Determinants of Health   Financial Resource Strain:   . Difficulty of Paying Living Expenses:   Food Insecurity:   . Worried About  Charity fundraiser in the Last Year:   . Arboriculturist in the Last Year:   Transportation Needs:   . Film/video editor (Medical):   Marland Kitchen Lack of Transportation (Non-Medical):   Physical Activity:   . Days of Exercise per Week:   . Minutes of Exercise per Session:   Stress:   . Feeling of Stress :   Social Connections:   . Frequency of Communication with Friends and Family:   . Frequency of Social Gatherings with Friends and Family:   . Attends Religious Services:   . Active Member of Clubs or Organizations:   . Attends Archivist Meetings:   Marland Kitchen Marital Status:   Intimate Partner Violence:   . Fear of Current or Ex-Partner:   . Emotionally Abused:   .  Physically Abused:   . Sexually Abused:       PHYSICAL EXAM  Vitals:   10/13/19 1425  BP: (!) 150/86  Pulse: (!) 55  Weight: 132 lb (59.9 kg)  Height: 5\' 8"  (1.727 m)   Body mass index is 20.07 kg/m.  Generalized: Well developed, in no acute distress  Cardiology: normal rate and rhythm, no murmur noted Respiratory: clear to auscultation bilaterally  Neurological examination  Mentation: Alert oriented to time, place, history taking. Follows all commands speech and language fluent Cranial nerve II-XII: Pupils were equal round reactive to light. Extraocular movements were full, visual field were full on confrontational test. Facial sensation and strength were normal. Head turning and shoulder shrug  were normal and symmetric. Motor: The motor testing reveals 5 over 5 strength of all 4 extremities. Slight bradykinesia noted of upper extremities.  Sensory: Sensory testing is intact to soft touch on all 4 extremities. No evidence of extinction is noted.  Coordination: Cerebellar testing reveals good finger-nose-finger and heel-to-shin bilaterally.  Gait and station: Gait not assessed. Patient does not have walker, no assistive device available, patient does not feel comfortable walking without walker.  Reflexes:  Deep tendon reflexes are symmetric and normal bilaterally.   DIAGNOSTIC DATA (LABS, IMAGING, TESTING) - I reviewed patient records, labs, notes, testing and imaging myself where available.  No flowsheet data found.   Lab Results  Component Value Date   WBC 7.7 10/12/2018   HGB 15.3 10/12/2018   HCT 46.0 10/12/2018   MCV 93 10/12/2018   PLT 128 (L) 10/12/2018      Component Value Date/Time   NA 142 10/12/2018 1145   K 4.8 10/12/2018 1145   CL 104 10/12/2018 1145   CO2 25 10/12/2018 1145   GLUCOSE 130 (H) 10/12/2018 1145   GLUCOSE 179 (H) 08/20/2016 1345   BUN 15 10/12/2018 1145   CREATININE 1.37 (H) 10/12/2018 1145   CREATININE 1.47 (H) 08/29/2015 0909   CALCIUM 8.7 10/12/2018 1145   PROT 6.7 01/26/2018 1355   ALBUMIN 3.8 01/26/2018 1355   AST 16 01/26/2018 1355   ALT 9 01/26/2018 1355   ALKPHOS 84 01/26/2018 1355   BILITOT 0.4 01/26/2018 1355   GFRNONAA 50 (L) 10/12/2018 1145   GFRAA 58 (L) 10/12/2018 1145   Lab Results  Component Value Date   CHOL 109 10/12/2018   HDL 38 (L) 10/12/2018   LDLCALC 49 10/12/2018   LDLDIRECT 99.1 09/18/2008   TRIG 112 10/12/2018   CHOLHDL 2.9 10/12/2018   Lab Results  Component Value Date   HGBA1C 5.8 (H) 01/26/2018   No results found for: [id number] Lab Results  Component Value Date   TSH 2.020 10/12/2018       ASSESSMENT AND PLAN 79 y.o. year old male  has a past medical history of ASHD (arteriosclerotic heart disease), COPD (chronic obstructive pulmonary disease) ([place]), Diabetes mellitus, Hemorrhoids, Hyperlipidemia, Hypertension, and Smoker. here with     ICD-10-CM   1. Parkinson's disease ([place])  G20   2. Basal ganglia infarction ([place])  I63.9     Ethen is doing fairly well. I have suggested we increase Sinemet dose to 1 tablet 4 times daily as this seems to work well for him. He was educated on appropriate administration and possible side effects. He was encouraged to continue follow up with PCP for stroke prevention  and management of co morbidities. He does continue to smoke and was encouraged to consider cessation. He is fully vaccinated. Healthy lifestyle habits  encouraged. Fall precautions advised. He will follow up in 1 year, sooner if needed.    No orders of the defined types were placed in this encounter.    No orders of the defined types were placed in this encounter.     I spent 30 minutes with the patient. 50% of this time was spent counseling and educating patient on plan of care and medications.     Debbora Presto, FNP-C 10/13/2019, 2:29 PM [facility] [address] [place], [place] [address] [phone]

## 2019-10-13 NOTE — Patient Instructions (Addendum)
We will increase Sinemet to 1 tablet 4 times daily. Please let me know if you have any new symptoms.   Follow up with PCP as advised.   Follow up with me in 1 year, sooner if needed   Parkinson's Disease Parkinson's disease causes problems with movements. It is a long-term condition. It gets worse over time (is progressive). It affects each person in different ways. It makes it harder for you to:  Control how your body moves.  Move your body normally. The condition can range from mild to very bad (advanced). What are the causes? This condition results from a loss of brain cells called neurons. These brain cells make a chemical called dopamine, which is needed to control body movement. As the condition gets worse, the brain cells make less dopamine. This makes it hard to move or control your movements. The exact cause of this condition is not known. What increases the risk?  Being male.  Being age 36 or older.  Having family members who had Parkinson's disease.  Having had an injury to the brain.  Being very sad (depressed).  Being around things that are harmful or poisonous. What are the signs or symptoms? Symptoms of this condition can vary. The main symptoms have to do with movement. These include:  A tremor or shaking while you are resting that you cannot control.  Stiffness in your neck, arms, and legs.  Slowing of movement. This may include: ? Losing expressions of the face. ? Having trouble making small movements that are needed to button your clothing or brush your teeth.  Walking in a way that is not normal. You may walk with short, shuffling steps.  Loss of balance when standing. You may sway, fall backward, or have trouble making turns. Other symptoms include:  Being very sad, worried, or confused.  Seeing or hearing things that are not real.  Losing thinking abilities (dementia).  Trouble speaking or swallowing.  Having a hard time pooping  (constipation).  Needing to pee right away, peeing often, or not being able to control when you pee or poop.  Sleep problems. How is this treated? There is no cure. The goal of treatment is to manage your symptoms. Treatment may include:  Medicines.  Therapy to help with talking or movement.  Surgery to reduce shaking and other movements that you cannot control. Follow these instructions at home: Medicines  Take over-the-counter and prescription medicines only as told by your doctor.  Avoid taking pain or sleeping medicines. Eating and drinking  Follow instructions from your doctor about what you cannot eat or drink.  Do not drink alcohol. Activity  Talk with your doctor about if it is safe for you to drive.  Do exercises as told by your doctor. Lifestyle      Put in grab bars and railings in your home. These help to prevent falls.  Do not use any products that contain nicotine or tobacco, such as cigarettes, e-cigarettes, and chewing tobacco. If you need help quitting, ask your doctor.  Join a support group. General instructions  Talk with your doctor about what you need help with and what your safety needs are.  Keep all follow-up visits as told by your doctor, including any therapy visits to help with talking or moving. This is important. Contact a doctor if:  Medicines do not help your symptoms.  You feel off-balance.  You fall at home.  You need more help at home.  You have trouble swallowing.  You have a very hard time pooping.  You have a lot of side effects from your medicines.  You feel very sad, worried, or confused. Get help right away if:  You were hurt in a fall.  You see or hear things that are not real.  You cannot swallow without choking.  You have chest pain or trouble breathing.  You do not feel safe at home.  You have thoughts about hurting yourself or others. If you ever feel like you may hurt yourself or others, or have  thoughts about taking your own life, get help right away. You can go to your nearest emergency department or call:  Your local emergency services (911 in the [place]  A suicide crisis helpline, such as the [place] at 703-622-1861. This is open 24 hours a day. Summary  This condition causes problems with movements.  It is a long-term condition. It gets worse over time.  There is no cure. Treatment focuses on managing your symptoms.  Talk with your doctor about what you need help with and what your safety needs are.  Keep all follow-up visits as told by your doctor. This is important. This information is not intended to replace advice given to you by your health care provider. Make sure you discuss any questions you have with your health care provider. Document Revised: 05/06/2018 Document Reviewed: 05/06/2018 Elsevier Patient Education  [place].     Stroke Prevention Some medical conditions and lifestyle choices can lead to a higher risk for a stroke. You can help to prevent a stroke by making nutrition, lifestyle, and other changes. What nutrition changes can be made?   Eat healthy foods. ? Choose foods that are high in fiber. These include:  Fresh fruits.  Fresh vegetables.  Whole grains. ? Eat at least 5 or more servings of fruits and vegetables each day. Try to fill half of your plate at each meal with fruits and vegetables. ? Choose lean protein foods. These include:  Lowfat (lean) cuts of meat.  Chicken without skin.  Fish.  Tofu.  Beans.  Nuts. ? Eat low-fat dairy products. ? Avoid foods that:  Are high in salt (sodium).  Have saturated fat.  Have trans fat.  Have cholesterol.  Are processed.  Are premade.  Follow eating guidelines as told by your doctor. These may include: ? Reducing how many calories you eat and drink each day. ? Limiting how much salt you eat or drink each day to 1,500 milligrams  (mg). ? Using only healthy fats for cooking. These include:  Olive oil.  Canola oil.  Sunflower oil. ? Counting how many carbohydrates you eat and drink each day. What lifestyle changes can be made?  Try to stay at a healthy weight. Talk to your doctor about what a good weight is for you.  Get at least 30 minutes of moderate physical activity at least 5 days a week. This can include: ? Fast walking. ? Biking. ? Swimming.  Do not use any products that have nicotine or tobacco. This includes cigarettes and e-cigarettes. If you need help quitting, ask your doctor. Avoid being around tobacco smoke in general.  Limit how much alcohol you drink to no more than 1 drink a day for nonpregnant women and 2 drinks a day for men. One drink equals 12 oz of beer, 5 oz of wine, or 1 oz of hard liquor.  Do not use drugs.  Avoid taking birth control pills. Talk  to your doctor about the risks of taking birth control pills if: ? You are over 36 years old. ? You smoke. ? You get migraines. ? You have had a blood clot. What other changes can be made?  Manage your cholesterol. ? It is important to eat a healthy diet. ? If your cholesterol cannot be managed through your diet, you may also need to take medicines. Take medicines as told by your doctor.  Manage your diabetes. ? It is important to eat a healthy diet and to exercise regularly. ? If your blood sugar cannot be managed through diet and exercise, you may need to take medicines. Take medicines as told by your doctor.  Control your high blood pressure (hypertension). ? Try to keep your blood pressure below 130/80. This can help lower your risk of stroke. ? It is important to eat a healthy diet and to exercise regularly. ? If your blood pressure cannot be managed through diet and exercise, you may need to take medicines. Take medicines as told by your doctor. ? Ask your doctor if you should check your blood pressure at home. ? Have your  blood pressure checked every year. Do this even if your blood pressure is normal.  Talk to your doctor about getting checked for a sleep disorder. Signs of this can include: ? Snoring a lot. ? Feeling very tired.  Take over-the-counter and prescription medicines only as told by your doctor. These may include aspirin or blood thinners (antiplatelets or anticoagulants).  Make sure that any other medical conditions you have are managed. Where to find more information  American Stroke Association: www.strokeassociation.org  National Stroke Association: www.stroke.org Get help right away if:  You have any symptoms of stroke. "BE FAST" is an easy way to remember the main warning signs: ? B - Balance. Signs are dizziness, sudden trouble walking, or loss of balance. ? E - Eyes. Signs are trouble seeing or a sudden change in how you see. ? F - Face. Signs are sudden weakness or loss of feeling of the face, or the face or eyelid drooping on one side. ? A - Arms. Signs are weakness or loss of feeling in an arm. This happens suddenly and usually on one side of the body. ? S - Speech. Signs are sudden trouble speaking, slurred speech, or trouble understanding what people say. ? T - Time. Time to call emergency services. Write down what time symptoms started.  You have other signs of stroke, such as: ? A sudden, very bad headache with no known cause. ? Feeling sick to your stomach (nausea). ? Throwing up (vomiting). ? Jerky movements you cannot control (seizure). These symptoms may represent a serious problem that is an emergency. Do not wait to see if the symptoms will go away. Get medical help right away. Call your local emergency services (911 in the [place] Do not drive yourself to the hospital. Summary  You can prevent a stroke by eating healthy, exercising, not smoking, drinking less alcohol, and treating other health problems, such as diabetes, high blood pressure, or high cholesterol.  Do  not use any products that contain nicotine or tobacco, such as cigarettes and e-cigarettes.  Get help right away if you have any signs or symptoms of a stroke. This information is not intended to replace advice given to you by your health care provider. Make sure you discuss any questions you have with your health care provider. Document Revised: 04/15/2018 Document Reviewed: 05/21/2016 Elsevier Patient Education  2020 Elsevier Inc.  

## 2019-10-27 NOTE — Progress Notes (Signed)
I reviewed note and agree with plan.   Penni Bombard, MD 0/17/4944, 9:67 PM Certified in Neurology, Neurophysiology and Neuroimaging  [facility] [address] [place], [place] [address] [phone]

## 2019-11-02 ENCOUNTER — Other Ambulatory Visit: Payer: Self-pay | Admitting: Internal Medicine

## 2020-01-16 ENCOUNTER — Other Ambulatory Visit: Payer: Self-pay | Admitting: Internal Medicine

## 2020-01-16 DIAGNOSIS — I152 Hypertension secondary to endocrine disorders: Secondary | ICD-10-CM

## 2020-01-16 DIAGNOSIS — E1159 Type 2 diabetes mellitus with other circulatory complications: Secondary | ICD-10-CM

## 2020-03-03 ENCOUNTER — Emergency Department (HOSPITAL_COMMUNITY): Payer: Medicare Other

## 2020-03-03 ENCOUNTER — Inpatient Hospital Stay (HOSPITAL_COMMUNITY)
Admission: EM | Admit: 2020-03-03 | Discharge: 2020-03-15 | DRG: 177 | Disposition: A | Discharge status: Skilled Nursing Facility | Payer: Medicare Other | Attending: Internal Medicine | Admitting: Internal Medicine

## 2020-03-03 ENCOUNTER — Other Ambulatory Visit: Payer: Self-pay

## 2020-03-03 DIAGNOSIS — R0902 Hypoxemia: Secondary | ICD-10-CM

## 2020-03-03 DIAGNOSIS — N1832 Chronic kidney disease, stage 3b: Secondary | ICD-10-CM | POA: Diagnosis present

## 2020-03-03 DIAGNOSIS — Z7982 Long term (current) use of aspirin: Secondary | ICD-10-CM | POA: Diagnosis not present

## 2020-03-03 DIAGNOSIS — R1312 Dysphagia, oropharyngeal phase: Secondary | ICD-10-CM | POA: Diagnosis not present

## 2020-03-03 DIAGNOSIS — Z820 Family history of epilepsy and other diseases of the nervous system: Secondary | ICD-10-CM

## 2020-03-03 DIAGNOSIS — J1282 Pneumonia due to coronavirus disease 2019: Secondary | ICD-10-CM | POA: Diagnosis present

## 2020-03-03 DIAGNOSIS — Z7902 Long term (current) use of antithrombotics/antiplatelets: Secondary | ICD-10-CM | POA: Diagnosis not present

## 2020-03-03 DIAGNOSIS — G20 Parkinson's disease: Secondary | ICD-10-CM | POA: Diagnosis present

## 2020-03-03 DIAGNOSIS — I5042 Chronic combined systolic (congestive) and diastolic (congestive) heart failure: Secondary | ICD-10-CM | POA: Diagnosis present

## 2020-03-03 DIAGNOSIS — Z79899 Other long term (current) drug therapy: Secondary | ICD-10-CM | POA: Diagnosis not present

## 2020-03-03 DIAGNOSIS — Z803 Family history of malignant neoplasm of breast: Secondary | ICD-10-CM

## 2020-03-03 DIAGNOSIS — I130 Hypertensive heart and chronic kidney disease with heart failure and stage 1 through stage 4 chronic kidney disease, or unspecified chronic kidney disease: Secondary | ICD-10-CM | POA: Diagnosis present

## 2020-03-03 DIAGNOSIS — R269 Unspecified abnormalities of gait and mobility: Secondary | ICD-10-CM | POA: Diagnosis not present

## 2020-03-03 DIAGNOSIS — F17210 Nicotine dependence, cigarettes, uncomplicated: Secondary | ICD-10-CM | POA: Diagnosis present

## 2020-03-03 DIAGNOSIS — E785 Hyperlipidemia, unspecified: Secondary | ICD-10-CM | POA: Diagnosis present

## 2020-03-03 DIAGNOSIS — Z66 Do not resuscitate: Secondary | ICD-10-CM | POA: Diagnosis present

## 2020-03-03 DIAGNOSIS — I2510 Atherosclerotic heart disease of native coronary artery without angina pectoris: Secondary | ICD-10-CM | POA: Diagnosis present

## 2020-03-03 DIAGNOSIS — R41841 Cognitive communication deficit: Secondary | ICD-10-CM | POA: Diagnosis not present

## 2020-03-03 DIAGNOSIS — I4510 Unspecified right bundle-branch block: Secondary | ICD-10-CM | POA: Diagnosis present

## 2020-03-03 DIAGNOSIS — R001 Bradycardia, unspecified: Secondary | ICD-10-CM | POA: Diagnosis not present

## 2020-03-03 DIAGNOSIS — E7439 Other disorders of intestinal carbohydrate absorption: Secondary | ICD-10-CM | POA: Diagnosis present

## 2020-03-03 DIAGNOSIS — Z7984 Long term (current) use of oral hypoglycemic drugs: Secondary | ICD-10-CM

## 2020-03-03 DIAGNOSIS — I10 Essential (primary) hypertension: Secondary | ICD-10-CM | POA: Diagnosis not present

## 2020-03-03 DIAGNOSIS — J9601 Acute respiratory failure with hypoxia: Secondary | ICD-10-CM | POA: Diagnosis present

## 2020-03-03 DIAGNOSIS — Z87891 Personal history of nicotine dependence: Secondary | ICD-10-CM

## 2020-03-03 DIAGNOSIS — J440 Chronic obstructive pulmonary disease with acute lower respiratory infection: Secondary | ICD-10-CM | POA: Diagnosis present

## 2020-03-03 DIAGNOSIS — Z20828 Contact with and (suspected) exposure to other viral communicable diseases: Secondary | ICD-10-CM | POA: Diagnosis not present

## 2020-03-03 DIAGNOSIS — M6281 Muscle weakness (generalized): Secondary | ICD-10-CM | POA: Diagnosis not present

## 2020-03-03 DIAGNOSIS — Z8673 Personal history of transient ischemic attack (TIA), and cerebral infarction without residual deficits: Secondary | ICD-10-CM | POA: Diagnosis not present

## 2020-03-03 DIAGNOSIS — R531 Weakness: Secondary | ICD-10-CM

## 2020-03-03 DIAGNOSIS — U071 COVID-19: Secondary | ICD-10-CM | POA: Diagnosis not present

## 2020-03-03 DIAGNOSIS — I69391 Dysphagia following cerebral infarction: Secondary | ICD-10-CM | POA: Diagnosis not present

## 2020-03-03 DIAGNOSIS — Z7401 Bed confinement status: Secondary | ICD-10-CM | POA: Diagnosis not present

## 2020-03-03 DIAGNOSIS — R0602 Shortness of breath: Secondary | ICD-10-CM | POA: Diagnosis not present

## 2020-03-03 DIAGNOSIS — M2550 Pain in unspecified joint: Secondary | ICD-10-CM | POA: Diagnosis not present

## 2020-03-03 DIAGNOSIS — J449 Chronic obstructive pulmonary disease, unspecified: Secondary | ICD-10-CM | POA: Diagnosis not present

## 2020-03-03 DIAGNOSIS — N183 Chronic kidney disease, stage 3 unspecified: Secondary | ICD-10-CM | POA: Diagnosis present

## 2020-03-03 DIAGNOSIS — E1122 Type 2 diabetes mellitus with diabetic chronic kidney disease: Secondary | ICD-10-CM | POA: Diagnosis present

## 2020-03-03 DIAGNOSIS — Z8616 Personal history of COVID-19: Secondary | ICD-10-CM | POA: Diagnosis present

## 2020-03-03 DIAGNOSIS — R7989 Other specified abnormal findings of blood chemistry: Secondary | ICD-10-CM | POA: Diagnosis not present

## 2020-03-03 DIAGNOSIS — Z20818 Contact with and (suspected) exposure to other bacterial communicable diseases: Secondary | ICD-10-CM | POA: Diagnosis not present

## 2020-03-03 DIAGNOSIS — R2681 Unsteadiness on feet: Secondary | ICD-10-CM | POA: Diagnosis not present

## 2020-03-03 DIAGNOSIS — E119 Type 2 diabetes mellitus without complications: Secondary | ICD-10-CM | POA: Diagnosis present

## 2020-03-03 DIAGNOSIS — I129 Hypertensive chronic kidney disease with stage 1 through stage 4 chronic kidney disease, or unspecified chronic kidney disease: Secondary | ICD-10-CM | POA: Diagnosis not present

## 2020-03-03 DIAGNOSIS — I69398 Other sequelae of cerebral infarction: Secondary | ICD-10-CM | POA: Diagnosis not present

## 2020-03-03 LAB — BASIC METABOLIC PANEL
Anion gap: 10 (ref 5–15)
BUN: 14 mg/dL (ref 8–23)
CO2: 27 mmol/L (ref 22–32)
Calcium: 8.9 mg/dL (ref 8.9–10.3)
Chloride: 101 mmol/L (ref 98–111)
Creatinine, Ser: 1.37 mg/dL — ABNORMAL HIGH (ref 0.61–1.24)
GFR, Estimated: 53 mL/min — ABNORMAL LOW (ref >60)
Glucose, Bld: 124 mg/dL — ABNORMAL HIGH (ref 70–99)
Potassium: 4 mmol/L (ref 3.5–5.1)
Sodium: 138 mmol/L (ref 135–145)

## 2020-03-03 LAB — CBC
HCT: 43 % (ref 39.0–52.0)
Hemoglobin: 14.2 g/dL (ref 13.0–17.0)
MCH: 30.9 pg (ref 26.0–34.0)
MCHC: 33 g/dL (ref 30.0–36.0)
MCV: 93.5 fL (ref 80.0–100.0)
Platelets: 96 10*3/uL — ABNORMAL LOW (ref 150–400)
RBC: 4.6 MIL/uL (ref 4.22–5.81)
RDW: 14.4 % (ref 11.5–15.5)
WBC: 10.3 10*3/uL (ref 4.0–10.5)
nRBC: 0 % (ref 0.0–0.2)

## 2020-03-03 LAB — TRIGLYCERIDES: Triglycerides: 83 mg/dL (ref <150)

## 2020-03-03 LAB — LACTATE DEHYDROGENASE: LDH: 147 U/L (ref 98–192)

## 2020-03-03 LAB — FERRITIN: Ferritin: 138 ng/mL (ref 24–336)

## 2020-03-03 LAB — URINALYSIS, ROUTINE W REFLEX MICROSCOPIC
Bacteria, UA: NONE SEEN
Bilirubin Urine: NEGATIVE
Glucose, UA: NEGATIVE mg/dL
Ketones, ur: NEGATIVE mg/dL
Leukocytes,Ua: NEGATIVE
Nitrite: NEGATIVE
Protein, ur: NEGATIVE mg/dL
Specific Gravity, Urine: 1.018 (ref 1.005–1.030)
pH: 7 (ref 5.0–8.0)

## 2020-03-03 LAB — PROCALCITONIN: Procalcitonin: <0.1 ng/mL

## 2020-03-03 LAB — RESP PANEL BY RT-PCR (FLU A&B, COVID) ARPGX2
Influenza A by PCR: NEGATIVE
Influenza B by PCR: NEGATIVE
SARS Coronavirus 2 by RT PCR: POSITIVE — AB

## 2020-03-03 LAB — C-REACTIVE PROTEIN: CRP: 1 mg/dL — ABNORMAL HIGH (ref <1.0)

## 2020-03-03 LAB — CBG MONITORING, ED
Glucose-Capillary: 122 mg/dL — ABNORMAL HIGH (ref 70–99)
Glucose-Capillary: 130 mg/dL — ABNORMAL HIGH (ref 70–99)

## 2020-03-03 LAB — FIBRINOGEN: Fibrinogen: 463 mg/dL (ref 210–475)

## 2020-03-03 MED ORDER — ASPIRIN EC 81 MG PO TBEC
81.0000 mg | DELAYED_RELEASE_TABLET | Freq: Every day | ORAL | Status: DC
  Administered 2020-03-04 – 2020-03-15 (×12): 81 mg via ORAL
  Filled 2020-03-03 (×12): qty 1

## 2020-03-03 MED ORDER — INSULIN ASPART 100 UNIT/ML ~~LOC~~ SOLN: 0.0000 [IU] | Freq: Every day | SUBCUTANEOUS | Status: DC

## 2020-03-03 MED ORDER — CARBIDOPA-LEVODOPA 25-100 MG PO TABS
1.0000 | ORAL_TABLET | ORAL | Status: DC
  Administered 2020-03-04 – 2020-03-15 (×34): 1 via ORAL
  Filled 2020-03-03 (×40): qty 1

## 2020-03-03 MED ORDER — INSULIN ASPART 100 UNIT/ML ~~LOC~~ SOLN
0.0000 [IU] | Freq: Three times a day (TID) | SUBCUTANEOUS | Status: DC
  Administered 2020-03-04 – 2020-03-06 (×7): 1 [IU] via SUBCUTANEOUS
  Administered 2020-03-06: 2 [IU] via SUBCUTANEOUS
  Administered 2020-03-07 (×2): 1 [IU] via SUBCUTANEOUS

## 2020-03-03 MED ORDER — ROSUVASTATIN CALCIUM 20 MG PO TABS
20.0000 mg | ORAL_TABLET | Freq: Every day | ORAL | Status: DC
Start: 2020-03-03 — End: 2020-03-15
  Administered 2020-03-03 – 2020-03-14 (×12): 20 mg via ORAL
  Filled 2020-03-03: qty 1
  Filled 2020-03-03: qty 4
  Filled 2020-03-03 (×10): qty 1

## 2020-03-03 MED ORDER — SODIUM CHLORIDE 0.9 % IV SOLN
100.0000 mg | Freq: Every day | INTRAVENOUS | Status: AC
  Administered 2020-03-04 – 2020-03-07 (×4): 100 mg via INTRAVENOUS
  Filled 2020-03-03: qty 20
  Filled 2020-03-03 (×3): qty 100

## 2020-03-03 MED ORDER — ENOXAPARIN SODIUM 40 MG/0.4ML ~~LOC~~ SOLN
40.0000 mg | SUBCUTANEOUS | Status: DC
  Administered 2020-03-03: 40 mg via SUBCUTANEOUS
  Filled 2020-03-03: qty 0.4

## 2020-03-03 MED ORDER — DEXAMETHASONE SODIUM PHOSPHATE 10 MG/ML IJ SOLN
6.0000 mg | INTRAMUSCULAR | Status: DC
  Administered 2020-03-03 – 2020-03-07 (×5): 6 mg via INTRAVENOUS
  Filled 2020-03-03 (×5): qty 1

## 2020-03-03 MED ORDER — ACETAMINOPHEN 325 MG PO TABS: 650.0000 mg | ORAL_TABLET | Freq: Four times a day (QID) | ORAL | Status: DC | PRN

## 2020-03-03 MED ORDER — ONDANSETRON HCL 4 MG/2ML IJ SOLN: 4.0000 mg | Freq: Four times a day (QID) | INTRAMUSCULAR | Status: DC | PRN

## 2020-03-03 MED ORDER — CLOPIDOGREL BISULFATE 75 MG PO TABS
75.0000 mg | ORAL_TABLET | Freq: Every day | ORAL | Status: DC
  Administered 2020-03-04 – 2020-03-15 (×12): 75 mg via ORAL
  Filled 2020-03-03 (×11): qty 1

## 2020-03-03 MED ORDER — SODIUM CHLORIDE 0.9 % IV BOLUS
500.0000 mL | Freq: Once | INTRAVENOUS | Status: AC
  Administered 2020-03-03: 500 mL via INTRAVENOUS

## 2020-03-03 MED ORDER — GUAIFENESIN-DM 100-10 MG/5ML PO SYRP: 10.0000 mL | ORAL_SOLUTION | ORAL | Status: DC | PRN

## 2020-03-03 MED ORDER — IPRATROPIUM-ALBUTEROL 20-100 MCG/ACT IN AERS
1.0000 | INHALATION_SPRAY | Freq: Four times a day (QID) | RESPIRATORY_TRACT | Status: DC
  Administered 2020-03-04 – 2020-03-13 (×36): 1 via RESPIRATORY_TRACT
  Filled 2020-03-03: qty 4

## 2020-03-03 MED ORDER — ONDANSETRON HCL 4 MG PO TABS: 4.0000 mg | ORAL_TABLET | Freq: Four times a day (QID) | ORAL | Status: DC | PRN

## 2020-03-03 MED ORDER — SODIUM CHLORIDE 0.9 % IV SOLN
200.0000 mg | Freq: Once | INTRAVENOUS | Status: AC
  Administered 2020-03-03: 200 mg via INTRAVENOUS
  Filled 2020-03-03: qty 40

## 2020-03-03 MED ORDER — LISINOPRIL 10 MG PO TABS
10.0000 mg | ORAL_TABLET | Freq: Every day | ORAL | Status: DC
  Administered 2020-03-04 – 2020-03-15 (×12): 10 mg via ORAL
  Filled 2020-03-03: qty 2
  Filled 2020-03-03 (×9): qty 1
  Filled 2020-03-03 (×2): qty 2

## 2020-03-03 MED ORDER — HYDROCOD POLST-CPM POLST ER 10-8 MG/5ML PO SUER: 5.0000 mL | Freq: Two times a day (BID) | ORAL | Status: DC | PRN

## 2020-03-03 NOTE — ED Notes (Signed)
Gave pt drink and samdwich.

## 2020-03-03 NOTE — ED Provider Notes (Signed)
[facility] EMERGENCY DEPARTMENT Provider Note   CSN: [id number] Arrival date & time: 03/03/20  1521     History Chief Complaint  Patient presents with  . Weakness    Donald Richardson is a 80 y.o. male.  Patient presents via EMS with general weakness, non prod cough, congestion, in the past couple days. Symptoms acute onset, moderate, constant, persistent. Pt limited historian - level 5 caveat. Denies headache. No neck pain or stiffness. No chest pain. Mild sob/sob w activity. No abd pain or nvd. No specific known ill contacts or known covid exposure. Is vaccinated for covid. Denies fever/chills/sweats.   The history is provided by the patient and the EMS personnel. The history is limited by the condition of the patient.  Weakness Associated symptoms: cough and shortness of breath   Associated symptoms: no abdominal pain, no chest pain, no fever, no headaches and no vomiting        Past Medical History:  Diagnosis Date  . ASHD (arteriosclerotic heart disease)    STENT  . COPD (chronic obstructive pulmonary disease) (HCC)   . Diabetes mellitus    Hgb A1C- 5.8 in May 2017  . Hemorrhoids   . Hyperlipidemia   . Hypertension   . Smoker     Patient Active Problem List   Diagnosis Date Noted  . Onychomycosis 03/16/2019  . 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 Parkinson's disease (HCC) 01/09/2017  . Former smoker 09/19/2016  . 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 Coronary artery disease involving native coronary artery of native heart without angina pectoris   . Small vessel disease, cerebrovascular 06/24/2016  . CVA (cerebrovascular accident due to intracerebral hemorrhage) (HCC)   . Thrombocytopenia (HCC)   . Acute on chronic combined systolic and diastolic CHF (congestive heart failure) (HCC)   . RBBB 06/21/2016  . CKD (chronic kidney disease), stage III (HCC) 06/21/2016  . H/O cataract extraction 06/26/2014  . COLD (chronic obstructive lung  disease) (HCC) 02/27/2014  . Hypertension 06/16/2012  . Glucose intolerance 10/17/2010  . Hyperlipidemia 08/14/2008  . CAD, NATIVE VESSEL 08/14/2008  . Bradycardia 08/14/2008    Past Surgical History:  Procedure Laterality Date  . INGUINAL HERNIA REPAIR Left 10/26/2015   Procedure: LEFT INGUINAL HERNIA REPAIR WITH MESH;  Surgeon: Avel Peace, MD;  Location: [facility];  Service: General;  Laterality: Left;  . INSERTION OF MESH Left 10/26/2015   Procedure: INSERTION OF MESH;  Surgeon: Avel Peace, MD;  Location: [facility];  Service: General;  Laterality: Left;  . NO PAST SURGERIES         Family History  Problem Relation Age of Onset  . Breast cancer Mother   . Parkinson's disease Father     Social History   Tobacco Use  . Smoking status: Former Smoker    Packs/day: 1.00    Types: Cigarettes    Quit date: 06/20/2016    Years since quitting: 3.7  . Smokeless tobacco: Never Used  Vaping Use  . Vaping Use: Never used  Substance Use Topics  . Alcohol use: No  . Drug use: No    Home Medications Prior to Admission medications   Medication Sig Start Date End Date Taking? Authorizing Provider  acetaminophen (TYLENOL) 500 MG tablet Take 1,000 mg by mouth every 6 (six) hours as needed for mild pain.    [provider]  aspirin 325 MG EC tablet Take 1 tablet (325 mg total) by mouth daily. 01/07/18   Dietrich Pates  V, MD  carbidopa-levodopa (SINEMET IR) 25-100 MG tablet TAKE 1 TABLET BY MOUTH FOUR TIMES DAILY 11/02/19   Fay Records, MD  clopidogrel (PLAVIX) 75 MG tablet Take 1 tablet (75 mg total) by mouth daily. 04/27/19   Fay Records, MD  lisinopril (ZESTRIL) 10 MG tablet TAKE 1 TABLET(10 MG) BY MOUTH DAILY 01/16/20   Fay Records, MD  rosuvastatin (CRESTOR) 20 MG tablet TAKE 1 TABLET(20 MG) BY MOUTH AT BEDTIME 05/05/19   Fay Records, MD    Allergies    Patient has no known allergies.  Review of Systems   Review of Systems  Constitutional: Negative for fever.   HENT: Positive for congestion. Negative for sore throat.   Eyes: Negative for redness.  Respiratory: Positive for cough and shortness of breath.   Cardiovascular: Negative for chest pain.  Gastrointestinal: Negative for abdominal pain and vomiting.  Genitourinary: Negative for flank pain.  Musculoskeletal: Negative for back pain, neck pain and neck stiffness.  Skin: Negative for rash.  Neurological: Positive for weakness. Negative for headaches.  Hematological: Does not bruise/bleed easily.  Psychiatric/Behavioral: Negative for confusion.    Physical Exam Updated Vital Signs BP (!) 151/72   Pulse (!) 58   Temp 98.8 F (37.1 C) (Oral)   Resp 16   SpO2 96%   Physical Exam Vitals and nursing note reviewed.  Constitutional:      Appearance: Normal appearance. He is well-developed.  HENT:     Head: Atraumatic.     Nose: Nose normal.     Mouth/Throat:     Mouth: Mucous membranes are moist.     Pharynx: Oropharynx is clear.  Eyes:     General: No scleral icterus.    Conjunctiva/sclera: Conjunctivae normal.     Pupils: Pupils are equal, round, and reactive to light.  Neck:     Trachea: No tracheal deviation.     Comments: No stiffness or rigidity.  Cardiovascular:     Rate and Rhythm: Normal rate and regular rhythm.     Pulses: Normal pulses.     Heart sounds: Normal heart sounds. No murmur heard. No friction rub. No gallop.   Pulmonary:     Effort: Pulmonary effort is normal. No accessory muscle usage or respiratory distress.     Breath sounds: Normal breath sounds.  Abdominal:     General: Bowel sounds are normal. There is no distension.     Palpations: Abdomen is soft.     Tenderness: There is no abdominal tenderness. There is no guarding.  Genitourinary:    Comments: No cva tenderness. Musculoskeletal:        General: No swelling or tenderness.     Cervical back: Normal range of motion and neck supple. No rigidity.     Right lower leg: No edema.     Left lower  leg: No edema.  Skin:    General: Skin is warm and dry.     Findings: No rash.  Neurological:     Mental Status: He is alert.     Comments: Alert, speech clear. Motor/sens grossly intact bil.   Psychiatric:        Mood and Affect: Mood normal.     ED Results / Procedures / Treatments   Labs (all labs ordered are listed, but only abnormal results are displayed) Results for orders placed or performed during the hospital encounter of 03/03/20  Resp Panel by RT-PCR (Flu A&B, Covid) Nasopharyngeal Swab   Specimen: Nasopharyngeal Swab; Nasopharyngeal(NP)  swabs in vial transport medium  Result Value Ref Range   SARS Coronavirus 2 by RT PCR POSITIVE (A) NEGATIVE   Influenza A by PCR NEGATIVE NEGATIVE   Influenza B by PCR NEGATIVE NEGATIVE  Basic metabolic panel  Result Value Ref Range   Sodium 138 135 - 145 mmol/L   Potassium 4.0 3.5 - 5.1 mmol/L   Chloride 101 98 - 111 mmol/L   CO2 27 22 - 32 mmol/L   Glucose, Bld 124 (H) 70 - 99 mg/dL   BUN 14 8 - 23 mg/dL   Creatinine, Ser 1.37 (H) 0.61 - 1.24 mg/dL   Calcium 8.9 8.9 - 10.3 mg/dL   GFR, Estimated 53 (L) >60 mL/min   Anion gap 10 5 - 15  CBC  Result Value Ref Range   WBC 10.3 4.0 - 10.5 K/uL   RBC 4.60 4.22 - 5.81 MIL/uL   Hemoglobin 14.2 13.0 - 17.0 g/dL   HCT 43.0 39.0 - 52.0 %   MCV 93.5 80.0 - 100.0 fL   MCH 30.9 26.0 - 34.0 pg   MCHC 33.0 30.0 - 36.0 g/dL   RDW 14.4 11.5 - 15.5 %   Platelets 96 (L) 150 - 400 K/uL   nRBC 0.0 0.0 - 0.2 %  CBG monitoring, ED  Result Value Ref Range   Glucose-Capillary 122 (H) 70 - 99 mg/dL   Comment 1 Notify RN    No results found.  EKG EKG Interpretation  Date/Time:  Saturday March 03 2020 15:23:30 EST Ventricular Rate:  71 PR Interval:  194 QRS Duration: 142 QT Interval:  418 QTC Calculation: [id number] R Axis:   66 Text Interpretation: Normal sinus rhythm Right bundle branch block Non-specific ST-t changes Confirmed by Lajean Saver [phone]) on 03/03/2020 5:25:34  PM   Radiology No results found.  Procedures Procedures (including critical care time)  Medications Ordered in ED Medications - No data to display  ED Course  I have reviewed the triage vital signs and the nursing notes.  Pertinent labs & imaging results that were available during my care of the patient were reviewed by me and considered in my medical decision making (see chart for details).    MDM Rules/Calculators/A&P                          Iv ns. Stat labs. Pcxr. covid swab sent.   Reviewed nursing notes and prior charts for additional history.   Labs reviewed/interpreted by me - covid is positive. Wbc normal.   JAREB TALK was evaluated in Emergency Department on 03/03/2020 for the symptoms described in the history of present illness. He was evaluated in the context of the global COVID-19 pandemic, which necessitated consideration that the patient might be at risk for infection with the SARS-CoV-2 virus that causes COVID-19. Institutional protocols and algorithms that pertain to the evaluation of patients at risk for COVID-19 are in a state of rapid change based on information released by regulatory bodies including the CDC and federal and state organizations. These policies and algorithms were followed during the patient's care in the ED.  cxr reviewed/interpreted by me -  ?atelectasis vs viral/covid pna.   On recheck, on room air sats, 88%, and with movement on stretcher/minimal activity lower. 2 liters o2 [place].  Pt indicates feels too weak, too sob w activity to go home. Given covid, deconditioning, general weakness and mild hypoxia, will admit.   Hospitalists consulted for admission.  Final Clinical Impression(s) / ED Diagnoses Final diagnoses:  None    Rx / DC Orders ED Discharge Orders    None       Lajean Saver, MD 03/03/20 2023

## 2020-03-03 NOTE — ED Triage Notes (Addendum)
Pt presents to ED BIB [facility]. Pt c/o generalized weakness, cough, and congestion since yesterday. Pt is vaccinated. Hx COPD, smoking  93% RA CBG - 124 HR - 74 170/90

## 2020-03-04 ENCOUNTER — Inpatient Hospital Stay (HOSPITAL_COMMUNITY): Payer: Medicare Other

## 2020-03-04 DIAGNOSIS — R7989 Other specified abnormal findings of blood chemistry: Secondary | ICD-10-CM

## 2020-03-04 DIAGNOSIS — U071 COVID-19: Secondary | ICD-10-CM | POA: Diagnosis not present

## 2020-03-04 DIAGNOSIS — J9601 Acute respiratory failure with hypoxia: Secondary | ICD-10-CM

## 2020-03-04 LAB — CBG MONITORING, ED
Glucose-Capillary: 147 mg/dL — ABNORMAL HIGH (ref 70–99)
Glucose-Capillary: 121 mg/dL — ABNORMAL HIGH (ref 70–99)
Glucose-Capillary: 115 mg/dL — ABNORMAL HIGH (ref 70–99)

## 2020-03-04 LAB — D-DIMER, QUANTITATIVE: D-Dimer, Quant: 1.94 ug/mL-FEU — ABNORMAL HIGH (ref 0.00–0.50)

## 2020-03-04 LAB — CBC WITH DIFFERENTIAL/PLATELET
Abs Immature Granulocytes: 0.03 10*3/uL (ref 0.00–0.07)
Basophils Absolute: 0 10*3/uL (ref 0.0–0.1)
Basophils Relative: 0 %
Eosinophils Absolute: 0 10*3/uL (ref 0.0–0.5)
Eosinophils Relative: 0 %
HCT: 41.4 % (ref 39.0–52.0)
Hemoglobin: 13.4 g/dL (ref 13.0–17.0)
Immature Granulocytes: 0 %
Lymphocytes Relative: 3 %
Lymphs Abs: 0.2 10*3/uL — ABNORMAL LOW (ref 0.7–4.0)
MCH: 30.2 pg (ref 26.0–34.0)
MCHC: 32.4 g/dL (ref 30.0–36.0)
MCV: 93.2 fL (ref 80.0–100.0)
Monocytes Absolute: 0.3 10*3/uL (ref 0.1–1.0)
Monocytes Relative: 4 %
Neutro Abs: 7.4 10*3/uL (ref 1.7–7.7)
Neutrophils Relative %: 93 %
Platelets: 95 10*3/uL — ABNORMAL LOW (ref 150–400)
RBC: 4.44 MIL/uL (ref 4.22–5.81)
RDW: 14.4 % (ref 11.5–15.5)
WBC: 8 10*3/uL (ref 4.0–10.5)
nRBC: 0 % (ref 0.0–0.2)

## 2020-03-04 LAB — COMPREHENSIVE METABOLIC PANEL
ALT: <5 U/L (ref 0–44)
AST: 16 U/L (ref 15–41)
Albumin: 2.9 g/dL — ABNORMAL LOW (ref 3.5–5.0)
Alkaline Phosphatase: 62 U/L (ref 38–126)
Anion gap: 9 (ref 5–15)
BUN: 19 mg/dL (ref 8–23)
CO2: 23 mmol/L (ref 22–32)
Calcium: 8.5 mg/dL — ABNORMAL LOW (ref 8.9–10.3)
Chloride: 106 mmol/L (ref 98–111)
Creatinine, Ser: 1.37 mg/dL — ABNORMAL HIGH (ref 0.61–1.24)
GFR, Estimated: 53 mL/min — ABNORMAL LOW (ref >60)
Glucose, Bld: 153 mg/dL — ABNORMAL HIGH (ref 70–99)
Potassium: 4.1 mmol/L (ref 3.5–5.1)
Sodium: 138 mmol/L (ref 135–145)
Total Bilirubin: 0.5 mg/dL (ref 0.3–1.2)
Total Protein: 6.2 g/dL — ABNORMAL LOW (ref 6.5–8.1)

## 2020-03-04 LAB — TSH: TSH: 0.345 u[IU]/mL — ABNORMAL LOW (ref 0.350–4.500)

## 2020-03-04 LAB — C-REACTIVE PROTEIN: CRP: 2.2 mg/dL — ABNORMAL HIGH (ref <1.0)

## 2020-03-04 LAB — MAGNESIUM: Magnesium: 1.8 mg/dL (ref 1.7–2.4)

## 2020-03-04 LAB — BRAIN NATRIURETIC PEPTIDE: B Natriuretic Peptide: 163.6 pg/mL — ABNORMAL HIGH (ref 0.0–100.0)

## 2020-03-04 LAB — PHOSPHORUS: Phosphorus: 3 mg/dL (ref 2.5–4.6)

## 2020-03-04 LAB — FERRITIN: Ferritin: 133 ng/mL (ref 24–336)

## 2020-03-04 MED ORDER — ENOXAPARIN SODIUM 40 MG/0.4ML ~~LOC~~ SOLN
40.0000 mg | Freq: Two times a day (BID) | SUBCUTANEOUS | Status: DC
  Administered 2020-03-04 – 2020-03-15 (×23): 40 mg via SUBCUTANEOUS
  Filled 2020-03-04 (×23): qty 0.4

## 2020-03-04 NOTE — ED Notes (Signed)
Patient bradycardiac while sleeping, dipping to 28 HR. Per PM RN this was normal for patient while sleeping, patient arousable and HR goes into 40s while awake.   On 2L [place] while sleeping d/t 88% on RA. Currently 95% on 2L [place] while sleeping.

## 2020-03-04 NOTE — ED Notes (Signed)
+  tele Breakfast Ordered

## 2020-03-04 NOTE — Progress Notes (Signed)
Bilateral lower extremity venous study completed.      Please see CV Proc for preliminary results.   Brand Siever, RVT  

## 2020-03-04 NOTE — ED Notes (Signed)
PT at bedside  Lunch tray given

## 2020-03-04 NOTE — H&P (Signed)
History and Physical  Patient Name: Donald Richardson     [id number]    DOB: [birth year]    DOA: 03/03/2020 PCP: Denita Lung, MD  Patient coming from: Home  Chief Complaint: Brought in after neighbors found him not doing well    HPI: Donald Richardson is a 80 y.o. male with hx of CVA, Parkinson's, bradycardia, tobacco abuse, cardiovascular disease, CKD three, non-insulin-dependent diabetes, COPD who presented to the hospital on 03/03/2020 after neighbors checked on him and noticed he was not himself and he was found to be Covid positive.  Patient is a poor historian, with no family bedside, thus a complete and thorough HPI unable to be obtained.  Apparently neighbors had not seen him in a few days and went to check on him.  They found him not acting like himself.  He found extremely weak, slightly altered,.  Patient himself says he is just not doing well.  He lives at home by himself but has assistance 5 days a week for several hours.  He doesn't leave the house except for appointments.  He denies any sick contacts but again people do come to the house to help him out.  He admits to some cough and some dyspnea.  He does not use oxygen at home.  He does have COPD, but does not use any inhalers.  He continues to smoke.  He is vaccinated for Covid.  He denied any fever, abdominal pain, headache, or vomiting or chills.   ED course: -Initial presentation, patient afebrile, heart rate 58, satting 87% on RA, blood pressure 147/79 -Relevant labs initial presentation: WBC 10, hemoglobin14, platelets96, Covid positive, sodium 138, potassium4, bicarb27, glucose 124, BUN14, creatinine1.37.  Chest x-ray showed findings of emphysema, possible developing infiltrate -He was given fluids and the hospitalist service was contacted for further evaluation and management.     ROS: A complete and thorough review systems obtained, negative listed in the HPI.      Past Medical History:  Diagnosis Date  . ASHD  (arteriosclerotic heart disease)    STENT  . COPD (chronic obstructive pulmonary disease) ([place])   . Diabetes mellitus    Hgb A1C- 5.8 in May 2017  . Hemorrhoids   . Hyperlipidemia   . Hypertension   . Smoker     Past Surgical History:  Procedure Laterality Date  . INGUINAL HERNIA REPAIR Left 10/26/2015   Procedure: LEFT INGUINAL HERNIA REPAIR WITH MESH;  Surgeon: Jackolyn Confer, MD;  Location: [facility];  Service: General;  Laterality: Left;  . INSERTION OF MESH Left 10/26/2015   Procedure: INSERTION OF MESH;  Surgeon: Jackolyn Confer, MD;  Location: [facility];  Service: General;  Laterality: Left;  . NO PAST SURGERIES      Social History: Patient lives at home by himself, but has assistance 5 days a week.  The patient walks with a walker.  Current smoker.  No Known Allergies  Family history: family history includes Breast cancer in his mother; Parkinson's disease in his father.  Prior to Admission medications   Medication Sig Start Date End Date Taking? Authorizing Provider  aspirin EC 81 MG tablet Take 81 mg by mouth daily. Swallow whole.   Yes [provider]  carbidopa-levodopa (SINEMET IR) 25-100 MG tablet TAKE 1 TABLET BY MOUTH FOUR TIMES DAILY Patient taking differently: Take 1 tablet by mouth See admin instructions. Take 1 tablet by mouth at 1 AM, 11 AM, and 4 PM 11/02/19  Yes Fay Records, MD  clopidogrel (PLAVIX) 75 MG tablet Take 1 tablet (75 mg total) by mouth daily. 04/27/19  Yes Fay Records, MD  lisinopril (ZESTRIL) 10 MG tablet TAKE 1 TABLET(10 MG) BY MOUTH DAILY Patient taking differently: Take 10 mg by mouth daily. 01/16/20  Yes Fay Records, MD  rosuvastatin (CRESTOR) 20 MG tablet TAKE 1 TABLET(20 MG) BY MOUTH AT BEDTIME Patient taking differently: Take 20 mg by mouth at bedtime. 05/05/19  Yes Fay Records, MD       Physical Exam: BP 140/64   Pulse (!) 42   Temp 98.8 F (37.1 C) (Oral)   Resp 11   SpO2 94%  General appearance: Frail appearing, adult  male, alert and in no significant distress, appears disheveled Eyes: Anicteric, conjunctiva pink, lids and lashes normal. PERRL.    ENT: No nasal deformity, discharge, epistaxis.  Hearing poor, hard of hearing. OP moist without lesions.   Neck: No neck masses.  Trachea midline.  No thyromegaly/tenderness. Lymph: No cervical or supraclavicular lymphadenopathy. Skin: Warm and dry.  No jaundice.  No suspicious rashes or lesions. Cardiac: Urticaria, nl S1-S2, no murmurs appreciated. .  No LE edema.  Radial and pedal pulses 2+ and symmetric. Respiratory: Coarse breath sounds bilaterally, diminished Abdomen: Abdomen soft.  Nontender to palpitation. No ascites, distension, hepatosplenomegaly.   Neuro: Cranial nerves two through twelve grossly intact.  Sensation intact to light touch. Speech is fluent   Psych: Pleasantly confused at times     Labs on Admission:  I have personally reviewed following labs and imaging studies: CBC: Recent Labs  Lab 03/03/20 1616 03/04/20 0319  WBC 10.3 8.0  NEUTROABS  --  7.4  HGB 14.2 13.4  HCT 43.0 41.4  MCV 93.5 93.2  PLT 96* 95*   Basic Metabolic Panel: Recent Labs  Lab 03/03/20 1616  NA 138  K 4.0  CL 101  CO2 27  GLUCOSE 124*  BUN 14  CREATININE 1.37*  CALCIUM 8.9   GFR: CrCl cannot be calculated (Unknown ideal weight.).  Liver Function Tests: No results for input(s): AST, ALT, ALKPHOS, BILITOT, PROT, ALBUMIN in the last 168 hours. No results for input(s): LIPASE, AMYLASE in the last 168 hours. No results for input(s): AMMONIA in the last 168 hours. Coagulation Profile: No results for input(s): INR, PROTIME in the last 168 hours. Cardiac Enzymes: No results for input(s): CKTOTAL, CKMB, CKMBINDEX, TROPONINI in the last 168 hours. BNP (last 3 results) No results for input(s): PROBNP in the last 8760 hours. HbA1C: No results for input(s): HGBA1C in the last 72 hours. CBG: Recent Labs  Lab 03/03/20 1528 03/03/20 2247  GLUCAP 122*  130*   Lipid Profile: Recent Labs    03/03/20 2057  TRIG 83   Thyroid Function Tests: No results for input(s): TSH, T4TOTAL, FREET4, T3FREE, THYROIDAB in the last 72 hours. Anemia Panel: Recent Labs    03/03/20 2057  FERRITIN 138     Recent Results (from the past 240 hour(s))  Resp Panel by RT-PCR (Flu A&B, Covid) Nasopharyngeal Swab     Status: Abnormal   Collection Time: 03/03/20  3:29 PM   Specimen: Nasopharyngeal Swab; Nasopharyngeal(NP) swabs in vial transport medium  Result Value Ref Range Status   SARS Coronavirus 2 by RT PCR POSITIVE (A) NEGATIVE Final    Comment: RESULT CALLED TO, READ BACK BY AND VERIFIED WITH: RN C STRAUGHAN AT 1713 03/03/2020 BY L BENFIELD (NOTE) SARS-CoV-2 target nucleic acids are DETECTED.  The SARS-CoV-2 RNA is generally detectable in  upper respiratory specimens during the acute phase of infection. Positive results are indicative of the presence of the identified virus, but do not rule out bacterial infection or co-infection with other pathogens not detected by the test. Clinical correlation with patient history and other diagnostic information is necessary to determine patient infection status. The expected result is Negative.  Fact Sheet for Patients: EntrepreneurPulse.com.au  Fact Sheet for Healthcare Providers: IncredibleEmployment.be  This test is not yet approved or cleared by the [place] FDA and  has been authorized for detection and/or diagnosis of SARS-CoV-2 by FDA under an Emergency Use Authorization (EUA).  This EUA will remain in effect (meaning this  test can be used) for the duration of  the COVID-19 declaration under Section 564(b)(1) of the Act, 21 U.S.C. section 360bbb-3(b)(1), unless the authorization is terminated or revoked sooner.     Influenza A by PCR NEGATIVE NEGATIVE Final   Influenza B by PCR NEGATIVE NEGATIVE Final    Comment: (NOTE) The Xpert Xpress  SARS-CoV-2/FLU/RSV plus assay is intended as an aid in the diagnosis of influenza from Nasopharyngeal swab specimens and should not be used as a sole basis for treatment. Nasal washings and aspirates are unacceptable for Xpert Xpress SARS-CoV-2/FLU/RSV testing.  Fact Sheet for Patients: EntrepreneurPulse.com.au  Fact Sheet for Healthcare Providers: IncredibleEmployment.be  This test is not yet approved or cleared by the [place] FDA and has been authorized for detection and/or diagnosis of SARS-CoV-2 by FDA under an Emergency Use Authorization (EUA). This EUA will remain in effect (meaning this test can be used) for the duration of the COVID-19 declaration under Section 564(b)(1) of the Act, 21 U.S.C. section 360bbb-3(b)(1), unless the authorization is terminated or revoked.  Performed at [facility], [place] [address], [place], [place] [address]            Radiological Exams on Admission: Personally reviewed: Findings of COPD with possible developing infiltrate DG Chest Port 1 View  Result Date: 03/03/2020 CLINICAL DATA:  Cough COVID EXAM: PORTABLE CHEST 1 VIEW COMPARISON:  08/20/2016 FINDINGS: Mild left retrocardiac opacity. No acute consolidation or pleural effusion. Emphysematous disease. Stable cardiomediastinal silhouette. No pneumothorax. Probable skin fold artifact over the left lower lateral chest. IMPRESSION: Emphysematous disease. Patchy left retrocardiac opacity, atelectasis versus mild infiltrate. Electronically Signed   By: Donavan Foil M.D.   On: 03/03/2020 19:29    EKG: Independently reviewed.  Normal sinus rhythm,RBBB    Assessment/Plan   1.  Acute hypoxic respiratory failure, secondary to Covid pneumonitis, with baseline of COPD -O2 saturation 87% on room air -Covid positive -Started on steroids, remdesivir, scheduled and as needed breathing treatments -Continue O2 segmentation, wean as tolerated  2.  Covid  pneumonitis -See further plans above  3.  COPD -Chronic but likely exacerbation secondary to Covid  4.  Tobacco abuse -Continues to smoke -Recommend cessation -Patient refused nicotine patch  5. Parkinson's -Continue home carbidopa/levodopa -PT and OT consulted  6.  Bradycardia -Chronic issue, stable currently      DVT prophylaxis: Lovenox Code Status: DNR Family Communication: Patient has no family Disposition Plan: Anticipate discharge home or possibly SNF Consults called: None Admission status: inpatient   Medical decision making: Patient seen at 3:41 AM on 03/04/2020.  The patient was discussed with ER provider.  What exists of the patient's chart was reviewed in depth and summarized above.  Clinical condition: Fair.        Doran Heater [facility] Please page though [place] or [employer]  secure chat:  For password, contact charge nurse

## 2020-03-04 NOTE — ED Notes (Signed)
MD aware of HR, will obtain EKG.  Patient given breakfast tray  Denies pain

## 2020-03-04 NOTE — Evaluation (Signed)
Physical Therapy Evaluation Patient Details Name: Donald Richardson MRN: [id number] DOB: 12/30/1940 Today's Date: 03/04/2020   History of Present Illness  80 y.o. male with hx of CVA, Parkinson's, bradycardia, tobacco abuse, cardiovascular disease, CKD three, non-insulin-dependent diabetes, COPD who presented to the hospital on 03/03/2020 after neighbors checked on him and noticed he was not himself, weak and cognitively altered, he was found to be Covid positive.  Clinical Impression  Pt presents to PT with generalized weakness and reduced activity tolerance compared to baseline. Pt with impaired core strength, having difficulty performing bed mobility at this time. Pt is able to transfer and ambulate without physical assistance but remains slowed (may be baseline due to Parkinson's disease). Pt does report he is weak but declines possibility of short term SNF placement. Pt reports he can likely have his PCA present daily for a short period of time until he is able to regain his strength. PT thus recommends HHPT and assistance for all mobility at the time of discharge.    Follow Up Recommendations Home health PT;Supervision for mobility/OOB (pt declining SNF, reports he can likely have his PCA daily, will need assistance for all mobility initially)    Equipment Recommendations  None recommended by PT    Recommendations for Other Services       Precautions / Restrictions Precautions Precautions: Fall Restrictions Weight Bearing Restrictions: No      Mobility  Bed Mobility Overal bed mobility: Needs Assistance Bed Mobility: Supine to Sit;Sit to Supine     Supine to sit: Min assist Sit to supine: Min guard        Transfers Overall transfer level: Needs assistance Equipment used: Rolling walker (2 wheeled) Transfers: Sit to/from Stand Sit to Stand: Min guard;From elevated surface (stretcher)            Ambulation/Gait Ambulation/Gait assistance: Min guard (minG progressing to  close supervision) Gait Distance (Feet): 60 Feet Assistive device: Rolling walker (2 wheeled) Gait Pattern/deviations: Step-to pattern Gait velocity: reduced Gait velocity interpretation: <1.31 ft/sec, indicative of household ambulator General Gait Details: pt with brief periods of freezing when coughing or changing direction  Stairs            Wheelchair Mobility    Modified Rankin (Stroke Patients Only)       Balance Overall balance assessment: Needs assistance Sitting-balance support: Single extremity supported;Bilateral upper extremity supported;Feet supported Sitting balance-Leahy Scale: Poor Sitting balance - Comments: reliant on unilateral UE support   Standing balance support: Single extremity supported;Bilateral upper extremity supported Standing balance-Leahy Scale: Poor Standing balance comment: reliant on UE support of RW                             Pertinent Vitals/Pain Pain Assessment: Faces Faces Pain Scale: No hurt    Home Living Family/patient expects to be discharged to:: Private residence Living Arrangements: Alone Available Help at Discharge: Friend(s);Personal care attendant (PCA 11AM-6PM mon-thur and sat) Type of Home: House Home Access: Stairs to enter Entrance Stairs-Rails: None Entrance Stairs-Number of Steps: 2 Home Layout: One level Home Equipment: Walker - 2 wheels      Prior Function Level of Independence: Needs assistance   Gait / Transfers Assistance Needed: pt ambulates household distances with use of RW  ADL's / Homemaking Assistance Needed: pt requires assistance with bathing and dressing, all meals are obtained at restaurants. Pt does not drive        Hand Dominance  Extremity/Trunk Assessment   Upper Extremity Assessment Upper Extremity Assessment: Generalized weakness    Lower Extremity Assessment Lower Extremity Assessment: Generalized weakness    Cervical / Trunk Assessment Cervical /  Trunk Assessment: Kyphotic  Communication   Communication: HOH  Cognition Arousal/Alertness: Awake/alert Behavior During Therapy: WFL for tasks assessed/performed Overall Cognitive Status: Within Functional Limits for tasks assessed                                        General Comments General comments (skin integrity, edema, etc.): VSS on RA    Exercises     Assessment/Plan    PT Assessment Patient needs continued PT services  PT Problem List Decreased strength;Decreased activity tolerance;Decreased balance;Decreased mobility       PT Treatment Interventions DME instruction;Gait training;Stair training;Functional mobility training;Therapeutic activities;Balance training;Patient/family education;Therapeutic exercise    PT Goals (Current goals can be found in the Care Plan section)  Acute Rehab PT Goals Patient Stated Goal: to return to baseline and regain strength PT Goal Formulation: With patient Time For Goal Achievement: 03/18/20 Potential to Achieve Goals: Good    Frequency Min 3X/week   Barriers to discharge        Co-evaluation               AM-PAC PT "6 Clicks" Mobility  Outcome Measure Help needed turning from your back to your side while in a flat bed without using bedrails?: A Little Help needed moving from lying on your back to sitting on the side of a flat bed without using bedrails?: A Little Help needed moving to and from a bed to a chair (including a wheelchair)?: A Little Help needed standing up from a chair using your arms (e.g., wheelchair or bedside chair)?: A Little Help needed to walk in hospital room?: A Little Help needed climbing 3-5 steps with a railing? : A Little 6 Click Score: 18    End of Session   Activity Tolerance: Patient tolerated treatment well Patient left: in bed;with call bell/phone within reach Nurse Communication: Mobility status PT Visit Diagnosis: Muscle weakness (generalized) (M62.81)    Time:  [id number] PT Time Calculation (min) (ACUTE ONLY): 25 min   Charges:   PT Evaluation $PT Eval Moderate Complexity: 1 Mod          Zenaida Niece, PT, DPT Acute Rehabilitation Pager: [phone]   Zenaida Niece 03/04/2020, 1:18 PM

## 2020-03-04 NOTE — Progress Notes (Signed)
PROGRESS NOTE                                                                                                                                                                                                             Patient Demographics:    Donald Richardson, is a 80 y.o. male, DOB - [birth year], ZDG:[id number]  Outpatient Primary MD for the patient is Ronnald Nian, MD    LOS - 1  Admit date - 03/03/2020    Chief Complaint  Patient presents with  . Weakness       Brief Narrative (HPI from H&P) -  Donald Richardson is a 80 y.o. male with hx of CVA, Parkinson's, bradycardia, tobacco abuse, cardiovascular disease, CKD three, non-insulin-dependent diabetes, COPD who presented to the hospital on 03/03/2020 after neighbors checked on him and noticed he was not himself and he was found to be Covid positive, in the ER he found to Covid PNA, sinus bradycardia, dehydration and he was admitted to the hospital   Subjective:    Donald Richardson today has, No headache, No chest pain, No abdominal pain - No Nausea, No new weakness tingling or numbness, mild cough and exertional shortness of breath.   Assessment  & Plan :     1. Acute Hypoxic Resp. Failure due to Acute Covid 19 Viral Pneumonitis during the ongoing 2020 Covid 19 Pandemic - he has received 2 shots of mRNA vaccine with last dose in February 2021 however has not received his third booster shot, he seems to have incurred moderate parenchymal lung disease, has been placed on steroids and Remdesivir.  Continue to monitor closely.  Encouraged the patient to sit up in chair in the daytime use I-S and flutter valve for pulmonary toiletry and then prone in bed when at night.  Will advance activity and titrate down oxygen as possible.   SpO2: 96 %  Recent Labs  Lab 03/03/20 1529 03/03/20 1616 03/03/20 2057 03/04/20 0319  WBC  --  10.3  --  8.0  HGB  --  14.2  --  13.4  HCT  --  43.0  --   41.4  PLT  --  96*  --  95*  CRP  --   --  1.0* 2.2*  DDIMER  --   --   --  1.94*  PROCALCITON  --   --  <  0.10  --   AST  --   --   --  16  ALT  --   --   --  <5  ALKPHOS  --   --   --  62  BILITOT  --   --   --  0.5  ALBUMIN  --   --   --  2.9*  SARSCOV2NAA POSITIVE*  --   --   --     Lab Results  Component Value Date   TSH 2.020 10/12/2018    2. 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  Check TSH.  Supportive care.  3.  History of smoking.  Counseled, he has refused nicotine patch.  4.  Underlying Parkinson's.  Continue carbidopa-levodopa.  PT OT consulted.  May require SNF.  5.  History of COPD.  At baseline.  Supportive care   6.  CKD stage IIIb.  Baseline creatinine around 1.4.  At baseline continue to monitor.  7.  High D-dimer due to inflammation.  High risk for DVT, moderate dose Lovenox, check leg ultrasound.    Condition - Extremely Guarded  Family Communication  :  Steffanie Dunn [phone] on 03/04/2018 tube  Code Status :  DNR  Consults  :  None  Procedures  :    PUD Prophylaxis :   Disposition Plan  :    Status is: Inpatient  Remains inpatient appropriate because:IV treatments appropriate due to intensity of illness or inability to take PO   Dispo: The patient is from: Home              Anticipated d/c is to: Home              Anticipated d/c date is: > 3 days              Patient currently is not medically stable to d/c.  DVT Prophylaxis  :  Lovenox    Lab Results  Component Value Date   PLT 95 (L) 03/04/2020    Diet :  Diet Order            Diet Carb Modified Fluid consistency: Thin; Room service appropriate? Yes  Diet effective now                  Inpatient Medications  Scheduled Meds: . aspirin EC  81 mg Oral Daily  . carbidopa-levodopa  1 tablet Oral 3 times per  day  . clopidogrel  75 mg Oral Daily  . dexamethasone (DECADRON) injection  6 mg Intravenous Q24H  . enoxaparin (LOVENOX) injection  40 mg Subcutaneous Q24H  . insulin aspart  0-5 Units Subcutaneous QHS  . insulin aspart  0-9 Units Subcutaneous TID WC  . Ipratropium-Albuterol  1 puff Inhalation Q6H  . lisinopril  10 mg Oral Daily  . rosuvastatin  20 mg Oral QHS   Continuous Infusions: . remdesivir 100 mg in NS 100 mL     PRN Meds:.acetaminophen, chlorpheniramine-HYDROcodone, guaiFENesin-dextromethorphan, ondansetron **OR** ondansetron (ZOFRAN) IV  Antibiotics  :    Anti-infectives (From admission, onward)   Start     Dose/Rate Route Frequency Ordered Stop   03/04/20 1000  remdesivir 100 mg in sodium chloride 0.9 % 100 mL IVPB       "Followed by" Linked Group Details   100 mg 200 mL/hr over 30  Minutes Intravenous Daily 03/03/20 2110 03/08/20 0959   03/03/20 2115  remdesivir 200 mg in sodium chloride 0.9% 250 mL IVPB       "Followed by" Linked Group Details   200 mg 580 mL/hr over 30 Minutes Intravenous Once 03/03/20 2110 03/03/20 2334       Time Spent in minutes  30   Donald Richardson M.D on 03/04/2020 at 8:58 AM  To page go to [email or web address]   [facility] -  Office  [phone]    See all Orders from today for further details    Objective:   Vitals:   03/04/20 0500 03/04/20 0600 03/04/20 0700 03/04/20 0800  BP: 118/62 116/77 (!) 99/57 (!) 128/59  Pulse: (!) 41 (!) 40 (!) 34 (!) 36  Resp: 12 11 11 12   Temp:    98.4 F (36.9 C)  TempSrc:    Oral  SpO2:  99% 93% 96%    Wt Readings from Last 3 Encounters:  10/13/19 59.9 kg  06/09/19 64 kg  03/16/19 64 kg     Intake/Output Summary (Last 24 hours) at 03/04/2020 0858 Last data filed at 03/04/2020 0144 Gross per 24 hour  Intake 500 ml  Output 0 ml  Net 500 ml     Physical Exam  Awake Alert, No new F.N deficits, bilateral hearing aids [place].AT,PERRAL Supple Neck,No JVD, No cervical lymphadenopathy  appriciated.  Symmetrical Chest wall movement, Good air movement bilaterally, CTAB RRR,No Gallops,Rubs or new Murmurs, No Parasternal Heave +ve B.Sounds, Abd Soft, No tenderness, No organomegaly appriciated, No rebound - guarding or rigidity. No Cyanosis, Clubbing or edema, No new Rash or bruise       Data Review:    CBC Recent Labs  Lab 03/03/20 1616 03/04/20 0319  WBC 10.3 8.0  HGB 14.2 13.4  HCT 43.0 41.4  PLT 96* 95*  MCV 93.5 93.2  MCH 30.9 30.2  MCHC 33.0 32.4  RDW 14.4 14.4  LYMPHSABS  --  0.2*  MONOABS  --  0.3  EOSABS  --  0.0  BASOSABS  --  0.0    Recent Labs  Lab 03/03/20 1616 03/03/20 2057 03/04/20 0319  NA 138  --  138  K 4.0  --  4.1  CL 101  --  106  CO2 27  --  23  GLUCOSE 124*  --  153*  BUN 14  --  19  CREATININE 1.37*  --  1.37*  CALCIUM 8.9  --  8.5*  AST  --   --  16  ALT  --   --  <5  ALKPHOS  --   --  62  BILITOT  --   --  0.5  ALBUMIN  --   --  2.9*  MG  --   --  1.8  CRP  --  1.0* 2.2*  DDIMER  --   --  1.94*  PROCALCITON  --  <0.10  --     ------------------------------------------------------------------------------------------------------------------ Recent Labs    03/03/20 2057  TRIG 83    Lab Results  Component Value Date   HGBA1C 5.8 (H) 01/26/2018   ------------------------------------------------------------------------------------------------------------------ No results for input(s): TSH, T4TOTAL, T3FREE, THYROIDAB in the last 72 hours.  Invalid input(s): FREET3  Cardiac Enzymes No results for input(s): CKMB, TROPONINI, MYOGLOBIN in the last 168 hours.  Invalid input(s): CK ------------------------------------------------------------------------------------------------------------------ No results found for: BNP  Micro Results Recent Results (from the past 240 hour(s))  Resp Panel by RT-PCR (Flu A&B, Covid) Nasopharyngeal Swab  Status: Abnormal   Collection Time: 03/03/20  3:29 PM   Specimen:  Nasopharyngeal Swab; Nasopharyngeal(NP) swabs in vial transport medium  Result Value Ref Range Status   SARS Coronavirus 2 by RT PCR POSITIVE (A) NEGATIVE Final    Comment: RESULT CALLED TO, READ BACK BY AND VERIFIED WITH: RN C STRAUGHAN AT 1713 03/03/2020 BY L BENFIELD (NOTE) SARS-CoV-2 target nucleic acids are DETECTED.  The SARS-CoV-2 RNA is generally detectable in upper respiratory specimens during the acute phase of infection. Positive results are indicative of the presence of the identified virus, but do not rule out bacterial infection or co-infection with other pathogens not detected by the test. Clinical correlation with patient history and other diagnostic information is necessary to determine patient infection status. The expected result is Negative.  Fact Sheet for Patients: EntrepreneurPulse.com.au  Fact Sheet for Healthcare Providers: IncredibleEmployment.be  This test is not yet approved or cleared by the [place] FDA and  has been authorized for detection and/or diagnosis of SARS-CoV-2 by FDA under an Emergency Use Authorization (EUA).  This EUA will remain in effect (meaning this  test can be used) for the duration of  the COVID-19 declaration under Section 564(b)(1) of the Act, 21 U.S.C. section 360bbb-3(b)(1), unless the authorization is terminated or revoked sooner.     Influenza A by PCR NEGATIVE NEGATIVE Final   Influenza B by PCR NEGATIVE NEGATIVE Final    Comment: (NOTE) The Xpert Xpress SARS-CoV-2/FLU/RSV plus assay is intended as an aid in the diagnosis of influenza from Nasopharyngeal swab specimens and should not be used as a sole basis for treatment. Nasal washings and aspirates are unacceptable for Xpert Xpress SARS-CoV-2/FLU/RSV testing.  Fact Sheet for Patients: EntrepreneurPulse.com.au  Fact Sheet for Healthcare Providers: IncredibleEmployment.be  This test is not  yet approved or cleared by the [place] FDA and has been authorized for detection and/or diagnosis of SARS-CoV-2 by FDA under an Emergency Use Authorization (EUA). This EUA will remain in effect (meaning this test can be used) for the duration of the COVID-19 declaration under Section 564(b)(1) of the Act, 21 U.S.C. section 360bbb-3(b)(1), unless the authorization is terminated or revoked.  Performed at [facility], [place] [address] [place], [place] [address]     Radiology Reports DG Chest Port 1 View  Result Date: 03/03/2020 CLINICAL DATA:  Cough COVID EXAM: PORTABLE CHEST 1 VIEW COMPARISON:  08/20/2016 FINDINGS: Mild left retrocardiac opacity. No acute consolidation or pleural effusion. Emphysematous disease. Stable cardiomediastinal silhouette. No pneumothorax. Probable skin fold artifact over the left lower lateral chest. IMPRESSION: Emphysematous disease. Patchy left retrocardiac opacity, atelectasis versus mild infiltrate. Electronically Signed   By: Donavan Foil M.D.   On: 03/03/2020 19:29

## 2020-03-04 NOTE — ED Notes (Signed)
Moved onto hospital bed for comfort

## 2020-03-04 NOTE — ED Notes (Signed)
Morning labs sent on PM shift, BNP added, called lab to add on.

## 2020-03-05 DIAGNOSIS — U071 COVID-19: Secondary | ICD-10-CM | POA: Diagnosis not present

## 2020-03-05 LAB — CBC WITH DIFFERENTIAL/PLATELET
Abs Immature Granulocytes: 0.03 10*3/uL (ref 0.00–0.07)
Basophils Absolute: 0 10*3/uL (ref 0.0–0.1)
Basophils Relative: 0 %
Eosinophils Absolute: 0 10*3/uL (ref 0.0–0.5)
Eosinophils Relative: 0 %
HCT: 39.5 % (ref 39.0–52.0)
Hemoglobin: 13.5 g/dL (ref 13.0–17.0)
Immature Granulocytes: 0 %
Lymphocytes Relative: 4 %
Lymphs Abs: 0.3 10*3/uL — ABNORMAL LOW (ref 0.7–4.0)
MCH: 31.3 pg (ref 26.0–34.0)
MCHC: 34.2 g/dL (ref 30.0–36.0)
MCV: 91.4 fL (ref 80.0–100.0)
Monocytes Absolute: 0.7 10*3/uL (ref 0.1–1.0)
Monocytes Relative: 8 %
Neutro Abs: 6.9 10*3/uL (ref 1.7–7.7)
Neutrophils Relative %: 88 %
Platelets: 98 10*3/uL — ABNORMAL LOW (ref 150–400)
RBC: 4.32 MIL/uL (ref 4.22–5.81)
RDW: 14.5 % (ref 11.5–15.5)
WBC: 7.9 10*3/uL (ref 4.0–10.5)
nRBC: 0 % (ref 0.0–0.2)

## 2020-03-05 LAB — COMPREHENSIVE METABOLIC PANEL
ALT: 8 U/L (ref 0–44)
AST: 21 U/L (ref 15–41)
Albumin: 2.8 g/dL — ABNORMAL LOW (ref 3.5–5.0)
Alkaline Phosphatase: 56 U/L (ref 38–126)
Anion gap: 9 (ref 5–15)
BUN: 30 mg/dL — ABNORMAL HIGH (ref 8–23)
CO2: 24 mmol/L (ref 22–32)
Calcium: 8.4 mg/dL — ABNORMAL LOW (ref 8.9–10.3)
Chloride: 104 mmol/L (ref 98–111)
Creatinine, Ser: 1.56 mg/dL — ABNORMAL HIGH (ref 0.61–1.24)
GFR, Estimated: 45 mL/min — ABNORMAL LOW (ref >60)
Glucose, Bld: 147 mg/dL — ABNORMAL HIGH (ref 70–99)
Potassium: 4.3 mmol/L (ref 3.5–5.1)
Sodium: 137 mmol/L (ref 135–145)
Total Bilirubin: 0.8 mg/dL (ref 0.3–1.2)
Total Protein: 5.8 g/dL — ABNORMAL LOW (ref 6.5–8.1)

## 2020-03-05 LAB — BRAIN NATRIURETIC PEPTIDE: B Natriuretic Peptide: 126.8 pg/mL — ABNORMAL HIGH (ref 0.0–100.0)

## 2020-03-05 LAB — GLUCOSE, CAPILLARY: Glucose-Capillary: 125 mg/dL — ABNORMAL HIGH (ref 70–99)

## 2020-03-05 LAB — CBG MONITORING, ED
Glucose-Capillary: 141 mg/dL — ABNORMAL HIGH (ref 70–99)
Glucose-Capillary: 128 mg/dL — ABNORMAL HIGH (ref 70–99)
Glucose-Capillary: 134 mg/dL — ABNORMAL HIGH (ref 70–99)

## 2020-03-05 LAB — C-REACTIVE PROTEIN: CRP: 3.3 mg/dL — ABNORMAL HIGH (ref <1.0)

## 2020-03-05 LAB — D-DIMER, QUANTITATIVE: D-Dimer, Quant: 1.93 ug/mL-FEU — ABNORMAL HIGH (ref 0.00–0.50)

## 2020-03-05 LAB — MAGNESIUM: Magnesium: 1.7 mg/dL (ref 1.7–2.4)

## 2020-03-05 NOTE — ED Notes (Signed)
Lunch Tray Ordered @ 1045 

## 2020-03-05 NOTE — Progress Notes (Signed)
PROGRESS NOTE                                                                                                                                                                                                             Patient Demographics:    Donald Richardson, is a 80 y.o. male, DOB - Nov 23, 1940, [id number]  Outpatient Primary MD for the patient is Denita Lung, MD    LOS - 2  Admit date - 03/03/2020    Chief Complaint  Patient presents with  . Weakness       Brief Narrative (HPI from H&P) -  Donald Richardson is a 80 y.o. male with hx of CVA, Parkinson's, bradycardia, tobacco abuse, cardiovascular disease, CKD three, non-insulin-dependent diabetes, COPD who presented to the hospital on 03/03/2020 after neighbors checked on him and noticed he was not himself and he was found to be Covid positive, in the ER he found to Covid PNA, sinus bradycardia, dehydration and he was admitted to the hospital   Subjective:   Patient in bed, appears comfortable, denies any headache, no fever, no chest pain or pressure, no shortness of breath , no abdominal pain. No focal weakness, feels better.   Assessment  & Plan :     1. Acute Hypoxic Resp. Failure due to Acute Covid 19 Viral Pneumonitis during the ongoing 2020 Covid 19 Pandemic - he has received 2 shots of mRNA vaccine with last dose in February 2021 however has not received his third booster shot, he seems to have incurred moderate parenchymal lung disease, has been placed on steroids and Remdesivir.  Continue to monitor closely.  Encouraged the patient to sit up in chair in the daytime use I-S and flutter valve for pulmonary toiletry and then prone in bed when at night.  Will advance activity and titrate down oxygen as possible.   Recent Labs  Lab 03/03/20 1529 03/03/20 1616 03/03/20 2057 03/04/20 0319 03/05/20 0614 03/05/20 0615  WBC  --  10.3  --  8.0 7.9  --   HGB  --  14.2  --   13.4 13.5  --   HCT  --  43.0  --  41.4 39.5  --   PLT  --  96*  --  95* 98*  --   CRP  --   --  1.0*  2.2* 3.3*  --   BNP  --   --   --  163.6*  --  126.8*  DDIMER  --   --   --  1.94* 1.93*  --   PROCALCITON  --   --  <0.10  --   --   --   AST  --   --   --  16 21  --   ALT  --   --   --  <5 8  --   ALKPHOS  --   --   --  62 56  --   BILITOT  --   --   --  0.5 0.8  --   ALBUMIN  --   --   --  2.9* 2.8*  --   SARSCOV2NAA POSITIVE*  --   --   --   --   --     Lab Results  Component Value Date   TSH 0.345 (L) 03/04/2020    2. 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  Also discussed with his POA Donald Richardson cardiologist, he SHF anything mildly suppressed suggesting sick euthyroid.  3.  History of smoking.  Counseled, he has refused nicotine patch.  4.  Underlying Parkinson's.  Continue carbidopa-levodopa.  PT OT consulted.  May require SNF.  5.  History of COPD.  At baseline.  Supportive care   6.  CKD stage IIIb.  Baseline creatinine around 1.5.  At baseline continue to monitor.  7.  High D-dimer due to inflammation.  High risk for DVT, negative leg ultrasound, continue moderate dose Lovenox.    Condition - Extremely Guarded  Family Communication  :  Donald LopeBrother Donald Richardson [phone] on 03/04/2018 , Donald Richardson POA on 03/04/2020.  Code Status :  DNR  Consults  :  None  Procedures  :    Leg ultrasound.  No DVT.  PUD Prophylaxis :   Disposition Plan  :    Status is: Inpatient  Remains inpatient appropriate because:IV treatments appropriate due to intensity of illness or inability to take PO   Dispo: The patient is from: Home              Anticipated d/c is to: Home              Anticipated d/c date is: > 3 days              Patient currently is not medically stable to d/c.  DVT Prophylaxis  :  Lovenox     Lab Results  Component Value Date   PLT 98 (L) 03/05/2020    Diet :  Diet Order            Diet Carb Modified Fluid consistency: Thin; Room service appropriate? Yes  Diet effective now                  Inpatient Medications  Scheduled Meds: . aspirin EC  81 mg Oral Daily  . carbidopa-levodopa  1 tablet Oral 3 times per day  . clopidogrel  75 mg Oral Daily  . dexamethasone (DECADRON) injection  6 mg Intravenous Q24H  . enoxaparin (LOVENOX) injection  40 mg Subcutaneous Q12H  . insulin aspart  0-5 Units Subcutaneous QHS  . insulin aspart  0-9 Units Subcutaneous TID WC  . Ipratropium-Albuterol  1 puff Inhalation Q6H  . lisinopril  10 mg Oral Daily  . rosuvastatin  20 mg Oral QHS   Continuous Infusions: . remdesivir 100 mg in NS 100 mL Stopped (03/04/20 1223)   PRN Meds:.acetaminophen, chlorpheniramine-HYDROcodone, guaiFENesin-dextromethorphan, ondansetron **OR** ondansetron (ZOFRAN) IV  Antibiotics  :    Anti-infectives (From admission, onward)   Start     Dose/Rate Route Frequency Ordered Stop   03/04/20 1000  remdesivir 100 mg in sodium chloride 0.9 % 100 mL IVPB       "Followed by" Linked Group Details   100 mg 200 mL/hr over 30 Minutes Intravenous Daily 03/03/20 2110 03/08/20 0959   03/03/20 2115  remdesivir 200 mg in sodium chloride 0.9% 250 mL IVPB       "Followed by" Linked Group Details   200 mg 580 mL/hr over 30 Minutes Intravenous Once 03/03/20 2110 03/03/20 2334       Time Spent in minutes  30   Lala Lund M.D on 03/05/2020 at 9:12 AM  To page go to [email or web address]   [facility] -  Office  [phone]    See all Orders from today for further details    Objective:   Vitals:   03/05/20 0613 03/05/20 0700 03/05/20 0730 03/05/20 0756  BP: (!) 142/69   (!) 147/62  Pulse: (!) 49 (!) 45 (!) 39 (!) 37  Resp: 15 15 15 12   Temp:      TempSrc:      SpO2: 99% 100% 98% 99%    Wt Readings from Last 3 Encounters:  10/13/19 59.9 kg   06/09/19 64 kg  03/16/19 64 kg     Intake/Output Summary (Last 24 hours) at 03/05/2020 0912 Last data filed at 03/04/2020 1223 Gross per 24 hour  Intake 253.33 ml  Output --  Net 253.33 ml     Physical Exam  Awake Alert, No new F.N deficits, Normal affect [place].AT,PERRAL Supple Neck,No JVD, No cervical lymphadenopathy appriciated.  Symmetrical Chest wall movement, Good air movement bilaterally, CTAB RRR,No Gallops, Rubs or new Murmurs, No Parasternal Heave +ve B.Sounds, Abd Soft, No tenderness, No organomegaly appriciated, No rebound - guarding or rigidity. No Cyanosis, Clubbing or edema, No new Rash or bruise       Data Review:    CBC Recent Labs  Lab 03/03/20 1616 03/04/20 0319 03/05/20 0614  WBC 10.3 8.0 7.9  HGB 14.2 13.4 13.5  HCT 43.0 41.4 39.5  PLT 96* 95* 98*  MCV 93.5 93.2 91.4  MCH 30.9 30.2 31.3  MCHC 33.0 32.4 34.2  RDW 14.4 14.4 14.5  LYMPHSABS  --  0.2* 0.3*  MONOABS  --  0.3 0.7  EOSABS  --  0.0 0.0  BASOSABS  --  0.0 0.0    Recent Labs  Lab 03/03/20 1616 03/03/20 2057 03/04/20 0319 03/05/20 0614 03/05/20 0615  NA 138  --  138 137  --   K 4.0  --  4.1 4.3  --   CL 101  --  106 104  --   CO2 27  --  23 24  --   GLUCOSE 124*  --  153* 147*  --   BUN 14  --  19 30*  --   CREATININE 1.37*  --  1.37* 1.56*  --   CALCIUM 8.9  --  8.5* 8.4*  --   AST  --   --  16 21  --   ALT  --   --  <5 8  --  ALKPHOS  --   --  62 56  --   BILITOT  --   --  0.5 0.8  --   ALBUMIN  --   --  2.9* 2.8*  --   MG  --   --  1.8 1.7  --   CRP  --  1.0* 2.2* 3.3*  --   DDIMER  --   --  1.94* 1.93*  --   PROCALCITON  --  <0.10  --   --   --   TSH  --   --  0.345*  --   --   BNP  --   --  163.6*  --  126.8*    ------------------------------------------------------------------------------------------------------------------ Recent Labs    03/03/20 2057  TRIG 83    Lab Results  Component Value Date   HGBA1C 5.8 (H) 01/26/2018    ------------------------------------------------------------------------------------------------------------------ Recent Labs    03/04/20 0319  TSH 0.345*    Cardiac Enzymes No results for input(s): CKMB, TROPONINI, MYOGLOBIN in the last 168 hours.  Invalid input(s): CK ------------------------------------------------------------------------------------------------------------------    Component Value Date/Time   BNP 126.8 (H) 03/05/2020 0615    Micro Results Recent Results (from the past 240 hour(s))  Resp Panel by RT-PCR (Flu A&B, Covid) Nasopharyngeal Swab     Status: Abnormal   Collection Time: 03/03/20  3:29 PM   Specimen: Nasopharyngeal Swab; Nasopharyngeal(NP) swabs in vial transport medium  Result Value Ref Range Status   SARS Coronavirus 2 by RT PCR POSITIVE (A) NEGATIVE Final    Comment: RESULT CALLED TO, READ BACK BY AND VERIFIED WITH: RN C STRAUGHAN AT 1713 03/03/2020 BY L BENFIELD (NOTE) SARS-CoV-2 target nucleic acids are DETECTED.  The SARS-CoV-2 RNA is generally detectable in upper respiratory specimens during the acute phase of infection. Positive results are indicative of the presence of the identified virus, but do not rule out bacterial infection or co-infection with other pathogens not detected by the test. Clinical correlation with patient history and other diagnostic information is necessary to determine patient infection status. The expected result is Negative.  Fact Sheet for Patients: EntrepreneurPulse.com.au  Fact Sheet for Healthcare Providers: IncredibleEmployment.be  This test is not yet approved or cleared by the [place] FDA and  has been authorized for detection and/or diagnosis of SARS-CoV-2 by FDA under an Emergency Use Authorization (EUA).  This EUA will remain in effect (meaning this  test can be used) for the duration of  the COVID-19 declaration under Section 564(b)(1) of the Act,  21 U.S.C. section 360bbb-3(b)(1), unless the authorization is terminated or revoked sooner.     Influenza A by PCR NEGATIVE NEGATIVE Final   Influenza B by PCR NEGATIVE NEGATIVE Final    Comment: (NOTE) The Xpert Xpress SARS-CoV-2/FLU/RSV plus assay is intended as an aid in the diagnosis of influenza from Nasopharyngeal swab specimens and should not be used as a sole basis for treatment. Nasal washings and aspirates are unacceptable for Xpert Xpress SARS-CoV-2/FLU/RSV testing.  Fact Sheet for Patients: EntrepreneurPulse.com.au  Fact Sheet for Healthcare Providers: IncredibleEmployment.be  This test is not yet approved or cleared by the [place] FDA and has been authorized for detection and/or diagnosis of SARS-CoV-2 by FDA under an Emergency Use Authorization (EUA). This EUA will remain in effect (meaning this test can be used) for the duration of the COVID-19 declaration under Section 564(b)(1) of the Act, 21 U.S.C. section 360bbb-3(b)(1), unless the authorization is terminated or revoked.  Performed at [facility], [place]  [address] [place], [place] [address]     Radiology Reports DG Chest Port 1 View  Result Date: 03/03/2020 CLINICAL DATA:  Cough COVID EXAM: PORTABLE CHEST 1 VIEW COMPARISON:  08/20/2016 FINDINGS: Mild left retrocardiac opacity. No acute consolidation or pleural effusion. Emphysematous disease. Stable cardiomediastinal silhouette. No pneumothorax. Probable skin fold artifact over the left lower lateral chest. IMPRESSION: Emphysematous disease. Patchy left retrocardiac opacity, atelectasis versus mild infiltrate. Electronically Signed   By: Donavan Foil M.D.   On: 03/03/2020 19:29   VAS [place] LOWER EXTREMITY VENOUS (DVT)  Result Date: 03/04/2020  Lower Venous DVT Study Other Indications: Covid, D-Dimer. Comparison Study: No previous Performing Technologist: Vonzell Schlatter RVT  Examination Guidelines: A complete evaluation  includes B-mode imaging, spectral Doppler, color Doppler, and power Doppler as needed of all accessible portions of each vessel. Bilateral testing is considered an integral part of a complete examination. Limited examinations for reoccurring indications may be performed as noted. The reflux portion of the exam is performed with the patient in reverse Trendelenburg.  +---------+---------------+---------+-----------+----------+--------------+ RIGHT    CompressibilityPhasicitySpontaneityPropertiesThrombus Aging +---------+---------------+---------+-----------+----------+--------------+ CFV      Full           Yes      Yes                                 +---------+---------------+---------+-----------+----------+--------------+ SFJ      Full                                                        +---------+---------------+---------+-----------+----------+--------------+ FV Prox  Full                                                        +---------+---------------+---------+-----------+----------+--------------+ FV Mid   Full                                                        +---------+---------------+---------+-----------+----------+--------------+ FV DistalFull                                                        +---------+---------------+---------+-----------+----------+--------------+ PFV      Full                                                        +---------+---------------+---------+-----------+----------+--------------+ POP      Full           Yes      Yes                                 +---------+---------------+---------+-----------+----------+--------------+  PTV      Full                                                        +---------+---------------+---------+-----------+----------+--------------+ PERO     Full                                                         +---------+---------------+---------+-----------+----------+--------------+   +---------+---------------+---------+-----------+----------+--------------+ LEFT     CompressibilityPhasicitySpontaneityPropertiesThrombus Aging +---------+---------------+---------+-----------+----------+--------------+ CFV      Full           Yes      Yes                                 +---------+---------------+---------+-----------+----------+--------------+ SFJ      Full                                                        +---------+---------------+---------+-----------+----------+--------------+ FV Prox  Full                                                        +---------+---------------+---------+-----------+----------+--------------+ FV Mid   Full                                                        +---------+---------------+---------+-----------+----------+--------------+ FV DistalFull                                                        +---------+---------------+---------+-----------+----------+--------------+ PFV      Full                                                        +---------+---------------+---------+-----------+----------+--------------+ POP      Full           Yes      Yes                                 +---------+---------------+---------+-----------+----------+--------------+ PTV      Full                                                        +---------+---------------+---------+-----------+----------+--------------+  PERO     Full                                                        +---------+---------------+---------+-----------+----------+--------------+     Summary: RIGHT: - There is no evidence of deep vein thrombosis in the lower extremity.  - No cystic structure found in the popliteal fossa.  LEFT: - There is no evidence of deep vein thrombosis in the lower extremity.  - No cystic structure found in the popliteal fossa.   *See table(s) above for measurements and observations.    Preliminary

## 2020-03-05 NOTE — ED Notes (Signed)
Md notified of pts hr ranging in the 40s.

## 2020-03-06 DIAGNOSIS — U071 COVID-19: Secondary | ICD-10-CM | POA: Diagnosis not present

## 2020-03-06 LAB — CBC WITH DIFFERENTIAL/PLATELET
Abs Immature Granulocytes: 0.02 10*3/uL (ref 0.00–0.07)
Basophils Absolute: 0 10*3/uL (ref 0.0–0.1)
Basophils Relative: 0 %
Eosinophils Absolute: 0 10*3/uL (ref 0.0–0.5)
Eosinophils Relative: 0 %
HCT: 37.3 % — ABNORMAL LOW (ref 39.0–52.0)
Hemoglobin: 12.9 g/dL — ABNORMAL LOW (ref 13.0–17.0)
Immature Granulocytes: 0 %
Lymphocytes Relative: 4 %
Lymphs Abs: 0.2 10*3/uL — ABNORMAL LOW (ref 0.7–4.0)
MCH: 31.1 pg (ref 26.0–34.0)
MCHC: 34.6 g/dL (ref 30.0–36.0)
MCV: 89.9 fL (ref 80.0–100.0)
Monocytes Absolute: 0.4 10*3/uL (ref 0.1–1.0)
Monocytes Relative: 5 %
Neutro Abs: 6 10*3/uL (ref 1.7–7.7)
Neutrophils Relative %: 91 %
Platelets: 106 10*3/uL — ABNORMAL LOW (ref 150–400)
RBC: 4.15 MIL/uL — ABNORMAL LOW (ref 4.22–5.81)
RDW: 14.5 % (ref 11.5–15.5)
WBC: 6.6 10*3/uL (ref 4.0–10.5)
nRBC: 0 % (ref 0.0–0.2)

## 2020-03-06 LAB — COMPREHENSIVE METABOLIC PANEL
ALT: <5 U/L (ref 0–44)
AST: 22 U/L (ref 15–41)
Albumin: 2.7 g/dL — ABNORMAL LOW (ref 3.5–5.0)
Alkaline Phosphatase: 48 U/L (ref 38–126)
Anion gap: 5 (ref 5–15)
BUN: 33 mg/dL — ABNORMAL HIGH (ref 8–23)
CO2: 25 mmol/L (ref 22–32)
Calcium: 7.9 mg/dL — ABNORMAL LOW (ref 8.9–10.3)
Chloride: 103 mmol/L (ref 98–111)
Creatinine, Ser: 1.26 mg/dL — ABNORMAL HIGH (ref 0.61–1.24)
GFR, Estimated: 58 mL/min — ABNORMAL LOW (ref >60)
Glucose, Bld: 156 mg/dL — ABNORMAL HIGH (ref 70–99)
Potassium: 4.4 mmol/L (ref 3.5–5.1)
Sodium: 133 mmol/L — ABNORMAL LOW (ref 135–145)
Total Bilirubin: 0.7 mg/dL (ref 0.3–1.2)
Total Protein: 5.5 g/dL — ABNORMAL LOW (ref 6.5–8.1)

## 2020-03-06 LAB — GLUCOSE, CAPILLARY
Glucose-Capillary: 137 mg/dL — ABNORMAL HIGH (ref 70–99)
Glucose-Capillary: 168 mg/dL — ABNORMAL HIGH (ref 70–99)
Glucose-Capillary: 132 mg/dL — ABNORMAL HIGH (ref 70–99)
Glucose-Capillary: 143 mg/dL — ABNORMAL HIGH (ref 70–99)

## 2020-03-06 LAB — D-DIMER, QUANTITATIVE: D-Dimer, Quant: 1.29 ug/mL-FEU — ABNORMAL HIGH (ref 0.00–0.50)

## 2020-03-06 LAB — C-REACTIVE PROTEIN: CRP: 4.4 mg/dL — ABNORMAL HIGH (ref <1.0)

## 2020-03-06 LAB — MAGNESIUM: Magnesium: 1.8 mg/dL (ref 1.7–2.4)

## 2020-03-06 LAB — BRAIN NATRIURETIC PEPTIDE: B Natriuretic Peptide: 136.9 pg/mL — ABNORMAL HIGH (ref 0.0–100.0)

## 2020-03-06 NOTE — Progress Notes (Signed)
Patient arrived to unit 2 west bed 32 from emergency department.Patient alert and oriented but hard of hearing.Patient has hearing aids but batteries need to be replaced none brought to hospital.Oriented patient to nursing unit,call bell and phone.Educated patient to call for assistance prior to getting out of bed and verbalized understanding.Bed alarm set for safety.No acute distress noted at present time.Will continue to monitor.

## 2020-03-06 NOTE — Plan of Care (Signed)
  Problem: Education: Goal: Knowledge of risk factors and measures for prevention of condition will improve Outcome: Progressing   Problem: Coping: Goal: Psychosocial and spiritual needs will be supported Outcome: Progressing   Problem: Respiratory: Goal: Will maintain a patent airway Outcome: Progressing Goal: Complications related to the disease process, condition or treatment will be avoided or minimized Outcome: Progressing   Problem: Education: Goal: Knowledge of General Education information will improve Description: Including pain rating scale, medication(s)/side effects and non-pharmacologic comfort measures Outcome: Progressing   Problem: Health Behavior/Discharge Planning: Goal: Ability to manage health-related needs will improve Outcome: Progressing   Problem: Clinical Measurements: Goal: Ability to maintain clinical measurements within normal limits will improve Outcome: Progressing Goal: Will remain free from infection Outcome: Progressing Goal: Diagnostic test results will improve Outcome: Progressing Goal: Respiratory complications will improve Outcome: Progressing   Problem: Activity: Goal: Risk for activity intolerance will decrease Outcome: Progressing   Problem: Nutrition: Goal: Adequate nutrition will be maintained Outcome: Progressing   Problem: Coping: Goal: Level of anxiety will decrease Outcome: Progressing   Problem: Elimination: Goal: Will not experience complications related to bowel motility Outcome: Progressing   Problem: Pain Managment: Goal: General experience of comfort will improve Outcome: Progressing   Problem: Safety: Goal: Ability to remain free from injury will improve Outcome: Progressing   Problem: Skin Integrity: Goal: Risk for impaired skin integrity will decrease Outcome: Progressing   

## 2020-03-06 NOTE — Progress Notes (Signed)
PROGRESS NOTE                                                                                                                                                                                                             Patient Demographics:    Donald Richardson, is a 80 y.o. male, DOB - 04-28-1940, TFT:[id number]  Outpatient Primary MD for the patient is Ronnald Nian, MD    LOS - 3  Admit date - 03/03/2020    Chief Complaint  Patient presents with  . Weakness       Brief Narrative (HPI from H&P) -  Donald Richardson is a 80 y.o. male with hx of CVA, Parkinson's, bradycardia, tobacco abuse, cardiovascular disease, CKD three, non-insulin-dependent diabetes, COPD who presented to the hospital on 03/03/2020 after neighbors checked on him and noticed he was not himself and he was found to be Covid positive, in the ER he found to Covid PNA, sinus bradycardia, dehydration and he was admitted to the hospital   Subjective:   Patient in bed, appears comfortable, denies any headache, no fever, no chest pain or pressure, no shortness of breath , no abdominal pain. No focal weakness.   Assessment  & Plan :     1. Acute Hypoxic Resp. Failure due to Acute Covid 19 Viral Pneumonitis during the ongoing 2020 Covid 19 Pandemic - he has received 2 shots of mRNA vaccine with last dose in February 2021 however has not received his third booster shot, he seems to have incurred moderate parenchymal lung disease, has been placed on steroids and Remdesivir course, responding very well to it.  Remains stable, clinically better await placement.  Encouraged the patient to sit up in chair in the daytime use I-S and flutter valve for pulmonary toiletry and then prone in bed when at night.  Will advance activity and titrate down oxygen as possible.  SpO2: 98 % O2 Flow Rate (L/min): 3 L/min    Recent Labs  Lab 03/03/20 1529 03/03/20 1616 03/03/20 2057  03/04/20 0319 03/05/20 0614 03/05/20 0615 03/06/20 0448  WBC  --  10.3  --  8.0 7.9  --  6.6  HGB  --  14.2  --  13.4 13.5  --  12.9*  HCT  --  43.0  --  41.4 39.5  --  37.3*  PLT  --  96*  --  95* 98*  --  106*  CRP  --   --  1.0* 2.2* 3.3*  --  4.4*  BNP  --   --   --  163.6*  --  126.8* 136.9*  DDIMER  --   --   --  1.94* 1.93*  --  1.29*  PROCALCITON  --   --  <0.10  --   --   --   --   AST  --   --   --  16 21  --  22  ALT  --   --   --  <5 8  --  <5  ALKPHOS  --   --   --  62 56  --  48  BILITOT  --   --   --  0.5 0.8  --  0.7  ALBUMIN  --   --   --  2.9* 2.8*  --  2.7*  SARSCOV2NAA POSITIVE*  --   --   --   --   --   --     Lab Results  Component Value Date   TSH 0.345 (L) 03/04/2020    2. 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  Also discussed with his POA Dr. Wyatt Haste cardiologist, he SHF anything mildly suppressed suggesting sick euthyroid.  3.  History of smoking.  Counseled, he has refused nicotine patch.  4.  Underlying Parkinson's.  Continue carbidopa-levodopa.  PT OT consulted.  May require SNF.  5.  History of COPD.  At baseline.  Supportive care   6.  CKD stage IIIb.  Baseline creatinine around 1.5.  At baseline continue to monitor.  7.  High D-dimer due to inflammation.  High risk for DVT, negative leg ultrasound, continue moderate dose Lovenox.    Condition - Extremely Guarded  Family Communication  :  Steffanie Dunn [phone] on 03/04/2018 , Dr. Dorris Carnes POA on 03/04/2020.  Code Status :  DNR  Consults  :  None  Procedures  :    Leg ultrasound.  No DVT.  PUD Prophylaxis :   Disposition Plan  :    Status is: Inpatient  Remains inpatient appropriate because:IV treatments appropriate due to intensity of illness or inability to take PO   Dispo: The patient is from: Home               Anticipated d/c is to: Home              Anticipated d/c date is: > 3 days              Patient currently is not medically stable to d/c.  DVT Prophylaxis  :  Lovenox    Lab Results  Component Value Date   PLT 106 (L) 03/06/2020    Diet :  Diet Order            Diet Carb Modified Fluid consistency: Thin; Room service appropriate? Yes  Diet effective now                  Inpatient Medications  Scheduled Meds: . aspirin EC  81 mg Oral Daily  . carbidopa-levodopa  1 tablet Oral 3 times per day  . clopidogrel  75 mg Oral Daily  . dexamethasone (DECADRON) injection  6 mg Intravenous Q24H  . enoxaparin (LOVENOX) injection  40 mg Subcutaneous Q12H  . insulin aspart  0-5 Units Subcutaneous QHS  . insulin aspart  0-9 Units Subcutaneous TID WC  . Ipratropium-Albuterol  1 puff Inhalation Q6H  . lisinopril  10 mg Oral Daily  . rosuvastatin  20 mg Oral QHS   Continuous Infusions: . remdesivir 100 mg in NS 100 mL 100 mg (03/06/20 0837)   PRN Meds:.acetaminophen, chlorpheniramine-HYDROcodone, guaiFENesin-dextromethorphan, ondansetron **OR** ondansetron (ZOFRAN) IV  Antibiotics  :    Anti-infectives (From admission, onward)   Start     Dose/Rate Route Frequency Ordered Stop   03/04/20 1000  remdesivir 100 mg in sodium chloride 0.9 % 100 mL IVPB       "Followed by" Linked Group Details   100 mg 200 mL/hr over 30 Minutes Intravenous Daily 03/03/20 2110 03/08/20 0959   03/03/20 2115  remdesivir 200 mg in sodium chloride 0.9% 250 mL IVPB       "Followed by" Linked Group Details   200 mg 580 mL/hr over 30 Minutes Intravenous Once 03/03/20 2110 03/03/20 2334       Time Spent in minutes  30   Lala Lund M.D on 03/06/2020 at 9:14 AM  To page go to [email or web address]   [facility] -  Office  [phone]    See all Orders from today for further details    Objective:   Vitals:   03/05/20 1752 03/05/20 2000 03/05/20 2128 03/05/20 2300  BP: (!) 154/71 (!) 151/64  135/67 (!) 124/58  Pulse: 65 (!) 35 (!) 45 (!) 40  Resp: 16 14 18 17   Temp:   98 F (36.7 C) 98.5 F (36.9 C)  TempSrc:   Oral Oral  SpO2: 94% 95% 100% 98%    Wt Readings from Last 3 Encounters:  10/13/19 59.9 kg  06/09/19 64 kg  03/16/19 64 kg     Intake/Output Summary (Last 24 hours) at 03/06/2020 0914 Last data filed at 03/05/2020 2230 Gross per 24 hour  Intake 220 ml  Output 900 ml  Net -680 ml     Physical Exam  Awake Alert, No new F.N deficits, Normal affect [place].AT,PERRAL Supple Neck,No JVD, No cervical lymphadenopathy appriciated.  Symmetrical Chest wall movement, Good air movement bilaterally, CTAB RRR,No Gallops, Rubs or new Murmurs, No Parasternal Heave +ve B.Sounds, Abd Soft, No tenderness, No organomegaly appriciated, No rebound - guarding or rigidity. No Cyanosis, Clubbing or edema, No new Rash or bruise      Data Review:    CBC Recent Labs  Lab 03/03/20 1616 03/04/20 0319 03/05/20 0614 03/06/20 0448  WBC 10.3 8.0 7.9 6.6  HGB 14.2 13.4 13.5 12.9*  HCT 43.0 41.4 39.5 37.3*  PLT 96* 95* 98* 106*  MCV 93.5 93.2 91.4 89.9  MCH 30.9 30.2 31.3 31.1  MCHC 33.0 32.4 34.2 34.6  RDW 14.4 14.4 14.5 14.5  LYMPHSABS  --  0.2* 0.3* 0.2*  MONOABS  --  0.3 0.7 0.4  EOSABS  --  0.0 0.0 0.0  BASOSABS  --  0.0 0.0 0.0    Recent Labs  Lab 03/03/20 1616 03/03/20 2057 03/04/20 0319 03/05/20 0614 03/05/20 0615 03/06/20 0448  NA 138  --  138 137  --  133*  K 4.0  --  4.1 4.3  --  4.4  CL 101  --  106 104  --  103  CO2 27  --  23 24  --  25  GLUCOSE 124*  --  153* 147*  --  156*  BUN 14  --  19 30*  --  33*  CREATININE 1.37*  --  1.37* 1.56*  --  1.26*  CALCIUM 8.9  --  8.5* 8.4*  --  7.9*  AST  --   --  16 21  --  22  ALT  --   --  <5 8  --  <5  ALKPHOS  --   --  62 56  --  48  BILITOT  --   --  0.5 0.8  --  0.7  ALBUMIN  --   --  2.9* 2.8*  --  2.7*  MG  --   --  1.8 1.7  --  1.8  CRP  --  1.0* 2.2* 3.3*  --  4.4*  DDIMER  --   --  1.94* 1.93*   --  1.29*  PROCALCITON  --  <0.10  --   --   --   --   TSH  --   --  0.345*  --   --   --   BNP  --   --  163.6*  --  126.8* 136.9*    ------------------------------------------------------------------------------------------------------------------ Recent Labs    03/03/20 2057  TRIG 83    Lab Results  Component Value Date   HGBA1C 5.8 (H) 01/26/2018   ------------------------------------------------------------------------------------------------------------------ Recent Labs    03/04/20 0319  TSH 0.345*    Cardiac Enzymes No results for input(s): CKMB, TROPONINI, MYOGLOBIN in the last 168 hours.  Invalid input(s): CK ------------------------------------------------------------------------------------------------------------------    Component Value Date/Time   BNP 136.9 (H) 03/06/2020 0448    Micro Results Recent Results (from the past 240 hour(s))  Resp Panel by RT-PCR (Flu A&B, Covid) Nasopharyngeal Swab     Status: Abnormal   Collection Time: 03/03/20  3:29 PM   Specimen: Nasopharyngeal Swab; Nasopharyngeal(NP) swabs in vial transport medium  Result Value Ref Range Status   SARS Coronavirus 2 by RT PCR POSITIVE (A) NEGATIVE Final    Comment: RESULT CALLED TO, READ BACK BY AND VERIFIED WITH: RN C STRAUGHAN AT 1713 03/03/2020 BY L BENFIELD (NOTE) SARS-CoV-2 target nucleic acids are DETECTED.  The SARS-CoV-2 RNA is generally detectable in upper respiratory specimens during the acute phase of infection. Positive results are indicative of the presence of the identified virus, but do not rule out bacterial infection or co-infection with other pathogens not detected by the test. Clinical correlation with patient history and other diagnostic information is necessary to determine patient infection status. The expected result is Negative.  Fact Sheet for Patients: EntrepreneurPulse.com.au  Fact Sheet for Healthcare  Providers: IncredibleEmployment.be  This test is not yet approved or cleared by the [place] FDA and  has been authorized for detection and/or diagnosis of SARS-CoV-2 by FDA under an Emergency Use Authorization (EUA).  This EUA will remain in effect (meaning this  test can be used) for the duration of  the COVID-19 declaration under Section 564(b)(1) of the Act, 21 U.S.C. section 360bbb-3(b)(1), unless the authorization is terminated or revoked sooner.     Influenza A by PCR NEGATIVE NEGATIVE Final   Influenza B by PCR NEGATIVE NEGATIVE Final    Comment: (NOTE) The Xpert Xpress SARS-CoV-2/FLU/RSV plus assay is intended as an aid in the diagnosis of influenza from Nasopharyngeal swab specimens and should not be used as a sole basis for treatment. Nasal washings and aspirates are unacceptable for Xpert Xpress SARS-CoV-2/FLU/RSV testing.  Fact Sheet for Patients: EntrepreneurPulse.com.au  Fact Sheet for Healthcare Providers: IncredibleEmployment.be  This test is not yet approved or cleared by the [place] FDA and has been authorized for detection and/or diagnosis of SARS-CoV-2 by FDA under an Emergency Use Authorization (EUA). This EUA will remain in effect (meaning this test can be used) for the duration of the COVID-19 declaration under Section 564(b)(1) of the Act, 21 U.S.C. section 360bbb-3(b)(1), unless the authorization is terminated or revoked.  Performed at [facility], [place] [address], [place], [place] [address]     Radiology Reports DG Chest Port 1 View  Result Date: 03/03/2020 CLINICAL DATA:  Cough COVID EXAM: PORTABLE CHEST 1 VIEW COMPARISON:  08/20/2016 FINDINGS: Mild left retrocardiac opacity. No acute consolidation or pleural effusion. Emphysematous disease. Stable cardiomediastinal silhouette. No pneumothorax. Probable skin fold artifact over the left lower lateral chest. IMPRESSION:  Emphysematous disease. Patchy left retrocardiac opacity, atelectasis versus mild infiltrate. Electronically Signed   By: Donavan Foil M.D.   On: 03/03/2020 19:29   VAS [place] LOWER EXTREMITY VENOUS (DVT)  Result Date: 03/05/2020  Lower Venous DVT Study Other Indications: Covid, D-Dimer. Comparison Study: No previous Performing Technologist: Vonzell Schlatter RVT  Examination Guidelines: A complete evaluation includes B-mode imaging, spectral Doppler, color Doppler, and power Doppler as needed of all accessible portions of each vessel. Bilateral testing is considered an integral part of a complete examination. Limited examinations for reoccurring indications may be performed as noted. The reflux portion of the exam is performed with the patient in reverse Trendelenburg.  +---------+---------------+---------+-----------+----------+--------------+ RIGHT    CompressibilityPhasicitySpontaneityPropertiesThrombus Aging +---------+---------------+---------+-----------+----------+--------------+ CFV      Full           Yes      Yes                                 +---------+---------------+---------+-----------+----------+--------------+ SFJ      Full                                                        +---------+---------------+---------+-----------+----------+--------------+ FV Prox  Full                                                        +---------+---------------+---------+-----------+----------+--------------+ FV Mid   Full                                                        +---------+---------------+---------+-----------+----------+--------------+ FV DistalFull                                                        +---------+---------------+---------+-----------+----------+--------------+ PFV      Full                                                        +---------+---------------+---------+-----------+----------+--------------+  POP      Full           Yes       Yes                                 +---------+---------------+---------+-----------+----------+--------------+ PTV      Full                                                        +---------+---------------+---------+-----------+----------+--------------+ PERO     Full                                                        +---------+---------------+---------+-----------+----------+--------------+   +---------+---------------+---------+-----------+----------+--------------+ LEFT     CompressibilityPhasicitySpontaneityPropertiesThrombus Aging +---------+---------------+---------+-----------+----------+--------------+ CFV      Full           Yes      Yes                                 +---------+---------------+---------+-----------+----------+--------------+ SFJ      Full                                                        +---------+---------------+---------+-----------+----------+--------------+ FV Prox  Full                                                        +---------+---------------+---------+-----------+----------+--------------+ FV Mid   Full                                                        +---------+---------------+---------+-----------+----------+--------------+ FV DistalFull                                                        +---------+---------------+---------+-----------+----------+--------------+ PFV      Full                                                        +---------+---------------+---------+-----------+----------+--------------+ POP      Full           Yes      Yes                                 +---------+---------------+---------+-----------+----------+--------------+  PTV      Full                                                        +---------+---------------+---------+-----------+----------+--------------+ PERO     Full                                                         +---------+---------------+---------+-----------+----------+--------------+     Summary: RIGHT: - There is no evidence of deep vein thrombosis in the lower extremity.  - No cystic structure found in the popliteal fossa.  LEFT: - There is no evidence of deep vein thrombosis in the lower extremity.  - No cystic structure found in the popliteal fossa.  *See table(s) above for measurements and observations. Electronically signed by Ruta Hinds MD on 03/05/2020 at 12:12:59 PM.    Final

## 2020-03-06 NOTE — Progress Notes (Signed)
Physical Therapy Treatment Patient Details Name: Donald Richardson MRN: [id number] DOB: [birth year] Today's Date: 03/06/2020    History of Present Illness 80 y.o. male with hx of CVA, Parkinson's, bradycardia, tobacco abuse, cardiovascular disease, CKD three, non-insulin-dependent diabetes, COPD who presented to the hospital on 03/03/2020 after neighbors checked on him and noticed he was not himself, weak and cognitively altered, he was found to be Covid positive.    PT Comments    Pt able to progress gait down the hallway with RW min guard assist overall.  His O2 sats stayed in the low to mid 90s and HR in the 50s to upper 40s.  He was able to walk and talk without signs of distress, increased WOB, or DOE.  He coughed once during our session.  We reviewed breathing exercises (IS and flutter valve) and RN reports he was up in the chair the majority of the day (just returned to bed before PT got there).  PT will continue to follow acutely for safe mobility progression.     Follow Up Recommendations  Home health PT;Supervision for mobility/OOB     Equipment Recommendations  None recommended by PT    Recommendations for Other Services       Precautions / Restrictions Precautions Precautions: Fall    Mobility  Bed Mobility Overal bed mobility: Needs Assistance Bed Mobility: Supine to Sit;Sit to Supine     Supine to sit: Supervision Sit to supine: Supervision   General bed mobility comments: He can do it supervision, it is a struggle and requires multiple attempts and extra time, but I wanted to see if he could do it if I did not help.  Transfers Overall transfer level: Needs assistance Equipment used: Rolling walker (2 wheeled) Transfers: Sit to/from Stand Sit to Stand: Min guard         General transfer comment: Min guard assist for safety and balance during transition up.  Ambulation/Gait Ambulation/Gait assistance: Min guard Gait Distance (Feet): 150 Feet Assistive device:  Rolling walker (2 wheeled) Gait Pattern/deviations: Step-through pattern;Shuffle;Trunk flexed Gait velocity: decreased Gait velocity interpretation: 1.31 - 2.62 ft/sec, indicative of limited community ambulator General Gait Details: Pt with shuffling gait pattern, most difficulty with turning and reversing typical of Parkinson's. Cues to stay closer to the RW, upright posture and to "look up".   Stairs             Wheelchair Mobility    Modified Rankin (Stroke Patients Only)       Balance Overall balance assessment: Needs assistance Sitting-balance support: Feet supported;No upper extremity supported Sitting balance-Leahy Scale: Fair     Standing balance support: Bilateral upper extremity supported Standing balance-Leahy Scale: Poor Standing balance comment: needs external support in standing from RW can be supervision for static standing with device.                            Cognition Arousal/Alertness: Awake/alert Behavior During Therapy: WFL for tasks assessed/performed Overall Cognitive Status: No family/caregiver present to determine baseline cognitive functioning                                 General Comments: some confusion likely due to very HOH and no working hearing aids today (batteries are dead, so I brought some batteries to his RN at the end of the session).      Exercises Other Exercises Other  Exercises: Pt able to demonstrate IS and flutter valve x 10 reps each.  Good technique and pt able to get 1700 mL max inspired volume on IS.    General Comments General comments (skin integrity, edema, etc.): Pt on RA with O2 sats 92% at rest in the bed, ambulated with poor wave, so difficult to tell how he was doing, but no DOE or signs of distress and he was able to walk and talk the entire time.  Immediately upon sitting down from gait O2 sats read 94% with good wave.  Pt's HR even during gait in the low 50s.      Pertinent  Vitals/Pain Pain Assessment: No/denies pain    Home Living                      Prior Function            PT Goals (current goals can now be found in the care plan section) Acute Rehab PT Goals Patient Stated Goal: to return to baseline and regain strength Progress towards PT goals: Progressing toward goals    Frequency    Min 3X/week      PT Plan Current plan remains appropriate    Co-evaluation              AM-PAC PT "6 Clicks" Mobility   Outcome Measure  Help needed turning from your back to your side while in a flat bed without using bedrails?: A Little Help needed moving from lying on your back to sitting on the side of a flat bed without using bedrails?: A Little Help needed moving to and from a bed to a chair (including a wheelchair)?: A Little Help needed standing up from a chair using your arms (e.g., wheelchair or bedside chair)?: A Little Help needed to walk in hospital room?: A Little Help needed climbing 3-5 steps with a railing? : A Little 6 Click Score: 18    End of Session   Activity Tolerance: Patient limited by fatigue Patient left: in bed;with call bell/phone within reach Nurse Communication: Mobility status PT Visit Diagnosis: Muscle weakness (generalized) (M62.81)     Time: 1624-1700 PT Time Calculation (min) (ACUTE ONLY): 36 min  Charges:  $Gait Training: 8-22 mins $Therapeutic Activity: 8-22 mins                     Verdene Lennert, PT, DPT  Acute Rehabilitation [phone] pager [phone]) [phone] office

## 2020-03-07 DIAGNOSIS — U071 COVID-19: Secondary | ICD-10-CM | POA: Diagnosis not present

## 2020-03-07 LAB — GLUCOSE, CAPILLARY
Glucose-Capillary: 130 mg/dL — ABNORMAL HIGH (ref 70–99)
Glucose-Capillary: 136 mg/dL — ABNORMAL HIGH (ref 70–99)
Glucose-Capillary: 88 mg/dL (ref 70–99)
Glucose-Capillary: 149 mg/dL — ABNORMAL HIGH (ref 70–99)

## 2020-03-07 NOTE — Plan of Care (Signed)
  Problem: Education: Goal: Knowledge of risk factors and measures for prevention of condition will improve Outcome: Progressing   Problem: Coping: Goal: Psychosocial and spiritual needs will be supported Outcome: Progressing   Problem: Respiratory: Goal: Will maintain a patent airway Outcome: Progressing Goal: Complications related to the disease process, condition or treatment will be avoided or minimized Outcome: Progressing   Problem: Education: Goal: Knowledge of General Education information will improve Description: Including pain rating scale, medication(s)/side effects and non-pharmacologic comfort measures Outcome: Progressing   Problem: Health Behavior/Discharge Planning: Goal: Ability to manage health-related needs will improve Outcome: Progressing   Problem: Clinical Measurements: Goal: Ability to maintain clinical measurements within normal limits will improve Outcome: Progressing Goal: Will remain free from infection Outcome: Progressing Goal: Diagnostic test results will improve Outcome: Progressing Goal: Respiratory complications will improve Outcome: Progressing   Problem: Activity: Goal: Risk for activity intolerance will decrease Outcome: Progressing   Problem: Nutrition: Goal: Adequate nutrition will be maintained Outcome: Progressing   Problem: Coping: Goal: Level of anxiety will decrease Outcome: Progressing   Problem: Elimination: Goal: Will not experience complications related to bowel motility Outcome: Progressing   Problem: Pain Managment: Goal: General experience of comfort will improve Outcome: Progressing   Problem: Safety: Goal: Ability to remain free from injury will improve Outcome: Progressing   Problem: Skin Integrity: Goal: Risk for impaired skin integrity will decrease Outcome: Progressing   

## 2020-03-07 NOTE — TOC Progression Note (Addendum)
Transition of Care [facility]) - Progression Note    Patient Details  Name: Donald Richardson MRN: [id number] Date of Birth: [birth year]  [facility] CM/SW Contact  Beckie Busing, RN Phone Number: [phone]  03/07/2020, 2:07 PM  Clinical Narrative:    CM received request from MD to discuss rehab as a discharge option with the patient. CM called patient and patient continues to say that he does not want to go to rehab. He wants to go home with [facility] and the Home Health that has been set up with [facility]. Patient gives CM permission to call brother Lucious Zou. CM called brother Onalee Hua and made him aware that patient does not want to go to rehab. Brother states that he thought this was settled and patient was agreeable to go to rehab for now because [facility] can not provide services until next week. Brother has taken CM contact number and states that he will reach out to the patient again and call CM back with a decision. TOC will continue to follow.   1500 CM received return call from brother stating that he could not get his brother to understand him on the telephone and asking CM if brother understood what she was saying? CM explained to brother that patient verbalized understanding  and stated that he is not going to rehab and that he wants to go home. CM informed brother that she would reach out to the patients bedside nurse and have her to go to the room and call brother. Bedside nurse made aware of request and states that the patient states that his brother is mad at him because he wont go to rehab and that he wants to go home. Patient continues to tell CM that he is not going to rehab. FL2 has been completed but info has not been faxed out due to patients refusal of rehab.  Expected Discharge Plan: Home w Home Health Services Barriers to Discharge: Continued Medical Work up  Expected Discharge Plan and Services Expected Discharge Plan: Home w Home Health  Services In-house Referral: NA Discharge Planning Services: CM Consult Post Acute Care Choice: Home Health Living arrangements for the past 2 months: Single Family Home                 DME Arranged: N/A DME Agency: NA       HH Arranged: RN,Nurse's Aide,PT,Social Work [employer] Agency: [facility] Date HH Agency Contacted: 03/07/20 Time HH Agency Contacted: 1010 Representative spoke with at [facility] Agency: Larry Sierras   Social Determinants of Health (SDOH) Interventions    Readmission Risk Interventions No flowsheet data found.

## 2020-03-07 NOTE — TOC Initial Note (Signed)
Transition of Care [facility]) - Initial/Assessment Note    Patient Details  Name: Donald Richardson MRN: [id number] Date of Birth: 1940/12/06  Transition of Care [facility]) CM/SW Contact:    Angelita Ingles, RN Phone Number: [phone]  03/07/2020, 10:13 AM  Clinical Narrative:    [facility] consulted for SNF placement. Patient has previously refused SNF per PT note. Patient continues to refuse SNF stating that he is going home. Patient reports that he received personal care services through [facility]. CM called [facility] to verify that patient does receive 8 hours per day from 11am -7pm  of nonmedical / companionship type services. Patient offered choice for [facility] services. HH (PT, RN, SW, [place] aide) has been set up with [facility]. MD placed order for DME rolling walker and 3in1. Patient states that he has a walker, BSC and shower chair at home and does not and he repeats does not want that stuff ordered again. TOC will continue to follow for any other home needs.                  Expected Discharge Plan: [place] Barriers to Discharge: Continued Medical Work up   Patient Goals and CMS Choice Patient states their goals for this hospitalization and ongoing recovery are:: Patient states tha he is ready to go home as soon as possible CMS Medicare.gov Compare Post Acute Care list provided to:: Patient Choice offered to / list presented to : Patient  Expected Discharge Plan and Services Expected Discharge Plan: [place] In-house Referral: NA Discharge Planning Services: CM Consult Post Acute Care Choice: [place] arrangements for the past 2 months: Single Family Home                 DME Arranged: N/A DME Agency: NA       HH Arranged: RN,Nurse's Aide,PT,Social Work [employer] Agency: [facility] [employer] [facility] [employer] Date [place]: 03/07/20 Time [place]: 1010 Representative spoke with at [place]: Bernette Mayers  Prior Living  Arrangements/Services Living arrangements for the past 2 months: [place] Lives with:: Self Patient language and need for interpreter reviewed:: Yes Do you feel safe going back to the place where you live?: Yes      Need for Family Participation in Patient Care: Yes (Comment) Care giver support system in place?: Yes (comment) Current home services: DME,Homehealth aide,Other (comment) (Home companion fron 11-7 daily through [facility]) Criminal Activity/Legal Involvement Pertinent to Current Situation/Hospitalization: No - Comment as needed  Activities of Daily Living      Permission Sought/Granted Permission sought to share information with : Other (comment) ([facility]) Permission granted to share information with : Yes, Verbal Permission Granted     Permission granted to share info w AGENCY: [facility]        Emotional Assessment Appearance:: Other (Comment Required (COVID patient/ phone conversation) Attitude/Demeanor/Rapport: Gracious Affect (typically observed): Unable to Assess Orientation: : Oriented to Self,Oriented to Place,Oriented to  Time,Oriented to Situation Alcohol / Substance Use: Not Applicable Psych Involvement: No (comment)  Admission diagnosis:  Hypoxia [R09.02] Generalized weakness [R53.1] COVID-19 virus infection [U07.1] COVID-19 [U07.1] Patient Active Problem List   Diagnosis Date Noted  . Acute respiratory failure with hypoxia ([place]) 03/04/2020  . COVID-19 03/03/2020  . Onychomycosis 03/16/2019  . 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 Parkinson's disease ([place]) 01/09/2017  . 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 Coronary artery disease involving  native coronary artery of native heart without angina pectoris   . Small vessel disease, cerebrovascular 06/24/2016  . CVA (cerebrovascular accident due to intracerebral hemorrhage) (HCC)   . Thrombocytopenia (HCC)   . Acute on chronic combined systolic and diastolic CHF (congestive  heart failure) (HCC)   . RBBB 06/21/2016  . CKD (chronic kidney disease), stage III (HCC) 06/21/2016  . H/O cataract extraction 06/26/2014  . COLD (chronic obstructive lung disease) (HCC) 02/27/2014  . Hypertension 06/16/2012  . Glucose intolerance 10/17/2010  . Hyperlipidemia 08/14/2008  . CAD, NATIVE VESSEL 08/14/2008  . Bradycardia 08/14/2008   PCP:  Ronnald Nian, MD Pharmacy:   [facility] [phone] - [place], [place] - [address] AT [facility] OF [address] [place] [address] Phone: [phone] Fax: [phone]     Social Determinants of Health (SDOH) Interventions    Readmission Risk Interventions No flowsheet data found.

## 2020-03-07 NOTE — NC FL2 (Signed)
[place] MEDICAID FL2 LEVEL OF CARE SCREENING TOOL     IDENTIFICATION  Patient Name: Donald Richardson Birthdate: 11/16/1940 Sex: male Admission Date (Current Location): 03/03/2020  [facility] and [place] Number:  Producer, television/film/video and Address:  The . [facility], [address] [place], [place] [address]      Provider Number: [id number]  Attending Physician Name and Address:  Leroy Sea, MD  Relative Name and Phone Number:       Current Level of Care: Hospital Recommended Level of Care: Skilled Nursing Facility Prior Approval Number:    Date Approved/Denied:   PASRR Number:    Discharge Plan: SNF    Current Diagnoses: Patient Active Problem List   Diagnosis Date Noted  . Acute respiratory failure with hypoxia (HCC) 03/04/2020  . COVID-19 03/03/2020  . Onychomycosis 03/16/2019  . 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 Parkinson's disease (HCC) 01/09/2017  . 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 Coronary artery disease involving native coronary artery of native heart without angina pectoris   . Small vessel disease, cerebrovascular 06/24/2016  . CVA (cerebrovascular accident due to intracerebral hemorrhage) (HCC)   . Thrombocytopenia (HCC)   . Acute on chronic combined systolic and diastolic CHF (congestive heart failure) (HCC)   . RBBB 06/21/2016  . CKD (chronic kidney disease), stage III (HCC) 06/21/2016  . H/O cataract extraction 06/26/2014  . COLD (chronic obstructive lung disease) (HCC) 02/27/2014  . Hypertension 06/16/2012  . Glucose intolerance 10/17/2010  . Hyperlipidemia 08/14/2008  . CAD, NATIVE VESSEL 08/14/2008  . Bradycardia 08/14/2008    Orientation RESPIRATION BLADDER Height & Weight     Self,Time,Situation,Place  Normal External catheter,Continent Weight:   Height:     BEHAVIORAL SYMPTOMS/MOOD NEUROLOGICAL BOWEL NUTRITION STATUS   (n/a)  (n/a) Continent Diet (carb modified diet)  AMBULATORY STATUS COMMUNICATION OF  NEEDS Skin   Limited Assist (min guard , rolling walker) Verbally Other (Comment) (dry and flaky)                       Personal Care Assistance Level of Assistance  Bathing,Feeding,Dressing,Total care Bathing Assistance: Limited assistance Feeding assistance: Independent (set up only) Dressing Assistance: Limited assistance Total Care Assistance: Limited assistance   Functional Limitations Info  Sight,Hearing,Speech Sight Info: Adequate Hearing Info: Impaired (Hearing aid) Speech Info: Adequate    SPECIAL CARE FACTORS FREQUENCY  PT (By licensed PT),OT (By licensed OT)     PT Frequency: 5X OT Frequency: 5X            Contractures Contractures Info: Not present    Additional Factors Info  Code Status,Allergies,Psychotropic,Insulin Sliding Scale,Isolation Precautions,Suctioning Needs Code Status Info: DNR Allergies Info: NKDA Psychotropic Info: none Insulin Sliding Scale Info: see d/c summary for sliding scale info Isolation Precautions Info: Airborne/ contact  ( COVID positive ) Suctioning Needs: n/a   Current Medications (03/07/2020):  This is the current hospital active medication list Current Facility-Administered Medications  Medication Dose Route Frequency Provider Last Rate Last Admin  . acetaminophen (TYLENOL) tablet 650 mg  650 mg Oral Q6H PRN Laqueta Due, DO      . aspirin EC tablet 81 mg  81 mg Oral Daily Laqueta Due, DO   81 mg at 03/07/20 7425  . carbidopa-levodopa (SINEMET IR) 25-100 MG per tablet immediate release 1 tablet  1 tablet Oral 3 times per day Laqueta Due, DO   1 tablet at 03/07/20 1232  . chlorpheniramine-HYDROcodone (TUSSIONEX)  10-8 MG/5ML suspension 5 mL  5 mL Oral Q12H PRN Doran Heater, DO      . clopidogrel (PLAVIX) tablet 75 mg  75 mg Oral Daily Doran Heater, DO   75 mg at 03/07/20 [id number]  . dexamethasone (DECADRON) injection 6 mg  6 mg Intravenous Q24H Doran Heater, DO   6 mg at 03/06/20 2022  .  enoxaparin (LOVENOX) injection 40 mg  40 mg Subcutaneous Q12H Thurnell Lose, MD   40 mg at 03/07/20 0829  . guaiFENesin-dextromethorphan (ROBITUSSIN DM) 100-10 MG/5ML syrup 10 mL  10 mL Oral Q4H PRN MacNeil, Richard G, DO      . insulin aspart (novoLOG) injection 0-5 Units  0-5 Units Subcutaneous QHS MacNeil, Richard G, DO      . insulin aspart (novoLOG) injection 0-9 Units  0-9 Units Subcutaneous TID WC Doran Heater, DO   1 Units at 03/07/20 1232  . Ipratropium-Albuterol (COMBIVENT) respimat 1 puff  1 puff Inhalation Q6H MacNeil, Richard G, DO   1 puff at 03/07/20 1232  . lisinopril (ZESTRIL) tablet 10 mg  10 mg Oral Daily Doran Heater, DO   10 mg at 03/07/20 [id number]  . ondansetron (ZOFRAN) tablet 4 mg  4 mg Oral Q6H PRN Doran Heater, DO       Or  . ondansetron (ZOFRAN) injection 4 mg  4 mg Intravenous Q6H PRN MacNeil, Richard G, DO      . rosuvastatin (CRESTOR) tablet 20 mg  20 mg Oral QHS MacNeil, Richard G, DO   20 mg at 03/06/20 2023     Discharge Medications: Please see discharge summary for a list of discharge medications.  Relevant Imaging Results:  Relevant Lab Results:   Additional Information SS# [id number]  [place], RN

## 2020-03-07 NOTE — Progress Notes (Signed)
PROGRESS NOTE                                                                                                                                                                                                             Patient Demographics:    Donald Richardson, is a 80 y.o. male, DOB - 11-26-1940, LE:[id number]  Outpatient Primary MD for the patient is Denita Lung, MD    LOS - 4  Admit date - 03/03/2020    Chief Complaint  Patient presents with  . Weakness       Brief Narrative (HPI from H&P) -  Donald Richardson is a 80 y.o. male with hx of CVA, Parkinson's, bradycardia, tobacco abuse, cardiovascular disease, CKD three, non-insulin-dependent diabetes, COPD who presented to the hospital on 03/03/2020 after neighbors checked on him and noticed he was not himself and he was found to be Covid positive, in the ER he found to Covid PNA, sinus bradycardia, dehydration and he was admitted to the hospital   Subjective:   Patient in bed, appears comfortable, denies any headache, no fever, no chest pain or pressure, no shortness of breath , no abdominal pain. No focal weakness, wants to home and not to SNF.   Assessment  & Plan :     1. Acute Hypoxic Resp. Failure due to Acute Covid 19 Viral Pneumonitis during the ongoing 2020 Covid 19 Pandemic - he has received 2 shots of mRNA vaccine with last dose in February 2021 however has not received his third booster shot, he seems to have incurred moderate parenchymal lung disease, has been placed on steroids and Remdesivir course, responding very well to it.  Remains stable, clinically better await placement.  Encouraged the patient to sit up in chair in the daytime use I-S and flutter valve for pulmonary toiletry and then prone in bed when at night.  Will advance activity and titrate down oxygen as possible.   Recent Labs  Lab 03/03/20 1529 03/03/20 1616 03/03/20 2057 03/04/20 0319  03/05/20 0614 03/05/20 0615 03/06/20 0448  WBC  --  10.3  --  8.0 7.9  --  6.6  HGB  --  14.2  --  13.4 13.5  --  12.9*  HCT  --  43.0  --  41.4 39.5  --  37.3*  PLT  --  96*  --  95* 98*  --  106*  CRP  --   --  1.0* 2.2* 3.3*  --  4.4*  BNP  --   --   --  163.6*  --  126.8* 136.9*  DDIMER  --   --   --  1.94* 1.93*  --  1.29*  PROCALCITON  --   --  <0.10  --   --   --   --   AST  --   --   --  16 21  --  22  ALT  --   --   --  <5 8  --  <5  ALKPHOS  --   --   --  62 56  --  48  BILITOT  --   --   --  0.5 0.8  --  0.7  ALBUMIN  --   --   --  2.9* 2.8*  --  2.7*  SARSCOV2NAA POSITIVE*  --   --   --   --   --   --     Lab Results  Component Value Date   TSH 0.345 (L) 03/04/2020    2. 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  Also discussed with his POA Dr. Wyatt Haste cardiologist, his TSH if anything is mildly suppressed suggesting sick euthyroid.  3.  History of smoking.  Counseled, he has refused nicotine patch.  4.  Underlying Parkinson's.  Continue carbidopa-levodopa.  PT OT consulted.  May require SNF if he agrees.  5.  History of COPD.  At baseline.  Supportive care   6.  CKD stage IIIb.  Baseline creatinine around 1.5.  At baseline continue to monitor.  7.  High D-dimer due to inflammation.  High risk for DVT, negative leg ultrasound, continue moderate dose Lovenox.    Condition - Extremely Guarded  Family Communication  :  Steffanie Dunn [phone] on 03/04/2018 , Dr. Dorris Carnes POA on 03/04/2020.  Contacted brother on 03/07/2020.  Code Status :  DNR  Consults  :  None  Procedures  :    Leg ultrasound.  No DVT.  PUD Prophylaxis :   Disposition Plan  :    Status is: Inpatient  Remains inpatient appropriate because:IV treatments appropriate due to intensity of illness or inability to take PO   Dispo:  The patient is from: Home              Anticipated d/c is to: Home              Anticipated d/c date is: > 3 days              Patient currently is not medically stable to d/c.  DVT Prophylaxis  :  Lovenox    Lab Results  Component Value Date   PLT 106 (L) 03/06/2020    Diet :  Diet Order            Diet Carb Modified Fluid consistency: Thin; Room service appropriate? Yes  Diet effective now                  Inpatient Medications  Scheduled Meds: . aspirin EC  81 mg Oral Daily  . carbidopa-levodopa  1 tablet Oral 3 times per day  . clopidogrel  75 mg Oral Daily  .  dexamethasone (DECADRON) injection  6 mg Intravenous Q24H  . enoxaparin (LOVENOX) injection  40 mg Subcutaneous Q12H  . insulin aspart  0-5 Units Subcutaneous QHS  . insulin aspart  0-9 Units Subcutaneous TID WC  . Ipratropium-Albuterol  1 puff Inhalation Q6H  . lisinopril  10 mg Oral Daily  . rosuvastatin  20 mg Oral QHS   Continuous Infusions: . remdesivir 100 mg in NS 100 mL 100 mg (03/06/20 0837)   PRN Meds:.acetaminophen, chlorpheniramine-HYDROcodone, guaiFENesin-dextromethorphan, ondansetron **OR** ondansetron (ZOFRAN) IV  Antibiotics  :    Anti-infectives (From admission, onward)   Start     Dose/Rate Route Frequency Ordered Stop   03/04/20 1000  remdesivir 100 mg in sodium chloride 0.9 % 100 mL IVPB       "Followed by" Linked Group Details   100 mg 200 mL/hr over 30 Minutes Intravenous Daily 03/03/20 2110 03/08/20 0959   03/03/20 2115  remdesivir 200 mg in sodium chloride 0.9% 250 mL IVPB       "Followed by" Linked Group Details   200 mg 580 mL/hr over 30 Minutes Intravenous Once 03/03/20 2110 03/03/20 2334       Time Spent in minutes  30   Lala Lund M.D on 03/07/2020 at 11:08 AM  To page go to [email or web address]   [facility] -  Office  [phone]    See all Orders from today for further details    Objective:   Vitals:   03/06/20 1706 03/06/20 2103 03/07/20 0616  03/07/20 0751  BP:  136/77 138/71 126/72  Pulse: (!) 50 (!) 47 (!) 45 (!) 41  Resp:  19 17 18   Temp:  98.3 F (36.8 C) 98.2 F (36.8 C) 98.4 F (36.9 C)  TempSrc:  Oral Oral   SpO2: 94% 94% 95% 92%    Wt Readings from Last 3 Encounters:  10/13/19 59.9 kg  06/09/19 64 kg  03/16/19 64 kg     Intake/Output Summary (Last 24 hours) at 03/07/2020 1108 Last data filed at 03/07/2020 0612 Gross per 24 hour  Intake 38.64 ml  Output 1200 ml  Net -1161.36 ml     Physical Exam  Awake Alert, No new F.N deficits, Normal affect [place].AT,PERRAL Supple Neck,No JVD, No cervical lymphadenopathy appriciated.  Symmetrical Chest wall movement, Good air movement bilaterally, CTAB RRR,No Gallops, Rubs or new Murmurs, No Parasternal Heave +ve B.Sounds, Abd Soft, No tenderness, No organomegaly appriciated, No rebound - guarding or rigidity. No Cyanosis, Clubbing or edema, No new Rash or bruise    Data Review:    CBC Recent Labs  Lab 03/03/20 1616 03/04/20 0319 03/05/20 0614 03/06/20 0448  WBC 10.3 8.0 7.9 6.6  HGB 14.2 13.4 13.5 12.9*  HCT 43.0 41.4 39.5 37.3*  PLT 96* 95* 98* 106*  MCV 93.5 93.2 91.4 89.9  MCH 30.9 30.2 31.3 31.1  MCHC 33.0 32.4 34.2 34.6  RDW 14.4 14.4 14.5 14.5  LYMPHSABS  --  0.2* 0.3* 0.2*  MONOABS  --  0.3 0.7 0.4  EOSABS  --  0.0 0.0 0.0  BASOSABS  --  0.0 0.0 0.0    Recent Labs  Lab 03/03/20 1616 03/03/20 2057 03/04/20 0319 03/05/20 0614 03/05/20 0615 03/06/20 0448  NA 138  --  138 137  --  133*  K 4.0  --  4.1 4.3  --  4.4  CL 101  --  106 104  --  103  CO2 27  --  23 24  --  25  GLUCOSE 124*  --  153* 147*  --  156*  BUN 14  --  19 30*  --  33*  CREATININE 1.37*  --  1.37* 1.56*  --  1.26*  CALCIUM 8.9  --  8.5* 8.4*  --  7.9*  AST  --   --  16 21  --  22  ALT  --   --  <5 8  --  <5  ALKPHOS  --   --  62 56  --  48  BILITOT  --   --  0.5 0.8  --  0.7  ALBUMIN  --   --  2.9* 2.8*  --  2.7*  MG  --   --  1.8 1.7  --  1.8  CRP  --  1.0* 2.2*  3.3*  --  4.4*  DDIMER  --   --  1.94* 1.93*  --  1.29*  PROCALCITON  --  <0.10  --   --   --   --   TSH  --   --  0.345*  --   --   --   BNP  --   --  163.6*  --  126.8* 136.9*    ------------------------------------------------------------------------------------------------------------------ No results for input(s): CHOL, HDL, LDLCALC, TRIG, CHOLHDL, LDLDIRECT in the last 72 hours.  Lab Results  Component Value Date   HGBA1C 5.8 (H) 01/26/2018   ------------------------------------------------------------------------------------------------------------------ No results for input(s): TSH, T4TOTAL, T3FREE, THYROIDAB in the last 72 hours.  Invalid input(s): FREET3  Cardiac Enzymes No results for input(s): CKMB, TROPONINI, MYOGLOBIN in the last 168 hours.  Invalid input(s): CK ------------------------------------------------------------------------------------------------------------------    Component Value Date/Time   BNP 136.9 (H) 03/06/2020 0448    Micro Results Recent Results (from the past 240 hour(s))  Resp Panel by RT-PCR (Flu A&B, Covid) Nasopharyngeal Swab     Status: Abnormal   Collection Time: 03/03/20  3:29 PM   Specimen: Nasopharyngeal Swab; Nasopharyngeal(NP) swabs in vial transport medium  Result Value Ref Range Status   SARS Coronavirus 2 by RT PCR POSITIVE (A) NEGATIVE Final    Comment: RESULT CALLED TO, READ BACK BY AND VERIFIED WITH: RN C STRAUGHAN AT 1713 03/03/2020 BY L BENFIELD (NOTE) SARS-CoV-2 target nucleic acids are DETECTED.  The SARS-CoV-2 RNA is generally detectable in upper respiratory specimens during the acute phase of infection. Positive results are indicative of the presence of the identified virus, but do not rule out bacterial infection or co-infection with other pathogens not detected by the test. Clinical correlation with patient history and other diagnostic information is necessary to determine patient infection status. The expected  result is Negative.  Fact Sheet for Patients: BloggerCourse.com  Fact Sheet for Healthcare Providers: SeriousBroker.it  This test is not yet approved or cleared by the [place] FDA and  has been authorized for detection and/or diagnosis of SARS-CoV-2 by FDA under an Emergency Use Authorization (EUA).  This EUA will remain in effect (meaning this  test can be used) for the duration of  the COVID-19 declaration under Section 564(b)(1) of the Act, 21 U.S.C. section 360bbb-3(b)(1), unless the authorization is terminated or revoked sooner.     Influenza A by PCR NEGATIVE NEGATIVE Final   Influenza B by PCR NEGATIVE NEGATIVE Final    Comment: (NOTE) The Xpert Xpress SARS-CoV-2/FLU/RSV plus assay is intended as an aid in the diagnosis of influenza from Nasopharyngeal swab specimens and should not be used as a sole basis for treatment. Nasal washings and aspirates  are unacceptable for Xpert Xpress SARS-CoV-2/FLU/RSV testing.  Fact Sheet for Patients: EntrepreneurPulse.com.au  Fact Sheet for Healthcare Providers: IncredibleEmployment.be  This test is not yet approved or cleared by the [place] FDA and has been authorized for detection and/or diagnosis of SARS-CoV-2 by FDA under an Emergency Use Authorization (EUA). This EUA will remain in effect (meaning this test can be used) for the duration of the COVID-19 declaration under Section 564(b)(1) of the Act, 21 U.S.C. section 360bbb-3(b)(1), unless the authorization is terminated or revoked.  Performed at [facility], [place] [address], [place], [place] [address]     Radiology Reports DG Chest Port 1 View  Result Date: 03/03/2020 CLINICAL DATA:  Cough COVID EXAM: PORTABLE CHEST 1 VIEW COMPARISON:  08/20/2016 FINDINGS: Mild left retrocardiac opacity. No acute consolidation or pleural effusion. Emphysematous disease. Stable  cardiomediastinal silhouette. No pneumothorax. Probable skin fold artifact over the left lower lateral chest. IMPRESSION: Emphysematous disease. Patchy left retrocardiac opacity, atelectasis versus mild infiltrate. Electronically Signed   By: Donavan Foil M.D.   On: 03/03/2020 19:29   VAS [place] LOWER EXTREMITY VENOUS (DVT)  Result Date: 03/05/2020  Lower Venous DVT Study Other Indications: Covid, D-Dimer. Comparison Study: No previous Performing Technologist: Vonzell Schlatter RVT  Examination Guidelines: A complete evaluation includes B-mode imaging, spectral Doppler, color Doppler, and power Doppler as needed of all accessible portions of each vessel. Bilateral testing is considered an integral part of a complete examination. Limited examinations for reoccurring indications may be performed as noted. The reflux portion of the exam is performed with the patient in reverse Trendelenburg.  +---------+---------------+---------+-----------+----------+--------------+ RIGHT    CompressibilityPhasicitySpontaneityPropertiesThrombus Aging +---------+---------------+---------+-----------+----------+--------------+ CFV      Full           Yes      Yes                                 +---------+---------------+---------+-----------+----------+--------------+ SFJ      Full                                                        +---------+---------------+---------+-----------+----------+--------------+ FV Prox  Full                                                        +---------+---------------+---------+-----------+----------+--------------+ FV Mid   Full                                                        +---------+---------------+---------+-----------+----------+--------------+ FV DistalFull                                                        +---------+---------------+---------+-----------+----------+--------------+ PFV      Full                                                         +---------+---------------+---------+-----------+----------+--------------+  POP      Full           Yes      Yes                                 +---------+---------------+---------+-----------+----------+--------------+ PTV      Full                                                        +---------+---------------+---------+-----------+----------+--------------+ PERO     Full                                                        +---------+---------------+---------+-----------+----------+--------------+   +---------+---------------+---------+-----------+----------+--------------+ LEFT     CompressibilityPhasicitySpontaneityPropertiesThrombus Aging +---------+---------------+---------+-----------+----------+--------------+ CFV      Full           Yes      Yes                                 +---------+---------------+---------+-----------+----------+--------------+ SFJ      Full                                                        +---------+---------------+---------+-----------+----------+--------------+ FV Prox  Full                                                        +---------+---------------+---------+-----------+----------+--------------+ FV Mid   Full                                                        +---------+---------------+---------+-----------+----------+--------------+ FV DistalFull                                                        +---------+---------------+---------+-----------+----------+--------------+ PFV      Full                                                        +---------+---------------+---------+-----------+----------+--------------+ POP      Full           Yes      Yes                                 +---------+---------------+---------+-----------+----------+--------------+  PTV      Full                                                         +---------+---------------+---------+-----------+----------+--------------+ PERO     Full                                                        +---------+---------------+---------+-----------+----------+--------------+     Summary: RIGHT: - There is no evidence of deep vein thrombosis in the lower extremity.  - No cystic structure found in the popliteal fossa.  LEFT: - There is no evidence of deep vein thrombosis in the lower extremity.  - No cystic structure found in the popliteal fossa.  *See table(s) above for measurements and observations. Electronically signed by Ruta Hinds MD on 03/05/2020 at 12:12:59 PM.    Final

## 2020-03-08 DIAGNOSIS — U071 COVID-19: Secondary | ICD-10-CM | POA: Diagnosis not present

## 2020-03-08 LAB — GLUCOSE, CAPILLARY
Glucose-Capillary: 121 mg/dL — ABNORMAL HIGH (ref 70–99)
Glucose-Capillary: 169 mg/dL — ABNORMAL HIGH (ref 70–99)
Glucose-Capillary: 75 mg/dL (ref 70–99)
Glucose-Capillary: 103 mg/dL — ABNORMAL HIGH (ref 70–99)

## 2020-03-08 NOTE — TOC Progression Note (Signed)
Transition of Care [facility]) - Progression Note    Patient Details  Name: ASHAD FAWBUSH MRN: [id number] Date of Birth: 1941-01-24  [facility] CM/SW Contact  Beckie Busing, RN Phone Number: [phone]  03/08/2020, 3:11 PM  Clinical Narrative:    CM received call from brother stating that patient is agreeable to rehab placement. CM called patient and patient states that he will go to rehab and gives verbal consent for CM to fax information out to facilities for bed offers. Info has been faxed out. Bed offers pending.   Expected Discharge Plan: Home w Home Health Services Barriers to Discharge: Continued Medical Work up  Expected Discharge Plan and Services Expected Discharge Plan: Home w Home Health Services In-house Referral: NA Discharge Planning Services: CM Consult Post Acute Care Choice: Home Health Living arrangements for the past 2 months: Single Family Home                 DME Arranged: N/A DME Agency: NA       HH Arranged: RN,Nurse's Aide,PT,Social Work [employer] Agency: [facility] [employer] [facility] Date HH Agency Contacted: 03/07/20 Time HH Agency Contacted: 1010 Representative spoke with at [facility] Agency: Larry Sierras   Social Determinants of Health (SDOH) Interventions    Readmission Risk Interventions No flowsheet data found.

## 2020-03-08 NOTE — Progress Notes (Signed)
PROGRESS NOTE                                                                                                                                                                                                             Patient Demographics:    Donald Richardson, is a 80 y.o. male, DOB - 1940-12-12, QMG:[id number]  Outpatient Primary MD for the patient is Ronnald Nian, MD    LOS - 5  Admit date - 03/03/2020    Chief Complaint  Patient presents with  . Weakness       Brief Narrative (HPI from H&P) -  Donald Richardson is a 80 y.o. male with hx of CVA, Parkinson's, bradycardia, tobacco abuse, cardiovascular disease, CKD three, non-insulin-dependent diabetes, COPD who presented to the hospital on 03/03/2020 after neighbors checked on him and noticed he was not himself and he was found to be Covid positive, in the ER he found to Covid PNA, sinus bradycardia, dehydration and he was admitted to the hospital   Subjective:   Patient in bed, appears comfortable, denies any headache, no fever, no chest pain or pressure, no shortness of breath , no abdominal pain. No focal weakness.    Assessment  & Plan :     1. Acute Hypoxic Resp. Failure due to Acute Covid 19 Viral Pneumonitis during the ongoing 2020 Covid 19 Pandemic - he has received 2 shots of mRNA vaccine with last dose in February 2021 however has not received his third booster shot, he seems to have incurred moderate parenchymal lung disease, has been placed on steroids and Remdesivir course, responding very well to it.  Remains stable, clinically better await placement.  Encouraged the patient to sit up in chair in the daytime use I-S and flutter valve for pulmonary toiletry and then prone in bed when at night.  Will advance activity and titrate down oxygen as possible.   Recent Labs  Lab 03/03/20 1529 03/03/20 1616 03/03/20 2057 03/04/20 0319 03/05/20 0614 03/05/20 0615  03/06/20 0448  WBC  --  10.3  --  8.0 7.9  --  6.6  HGB  --  14.2  --  13.4 13.5  --  12.9*  HCT  --  43.0  --  41.4 39.5  --  37.3*  PLT  --  96*  --  95* 98*  --  106*  CRP  --   --  1.0* 2.2* 3.3*  --  4.4*  BNP  --   --   --  163.6*  --  126.8* 136.9*  DDIMER  --   --   --  1.94* 1.93*  --  1.29*  PROCALCITON  --   --  <0.10  --   --   --   --   AST  --   --   --  16 21  --  22  ALT  --   --   --  <5 8  --  <5  ALKPHOS  --   --   --  62 56  --  48  BILITOT  --   --   --  0.5 0.8  --  0.7  ALBUMIN  --   --   --  2.9* 2.8*  --  2.7*  SARSCOV2NAA POSITIVE*  --   --   --   --   --   --     Lab Results  Component Value Date   TSH 0.345 (L) 03/04/2020    2. 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  Also discussed with his POA Dr. Wyatt Haste cardiologist, his TSH if anything is mildly suppressed suggesting sick euthyroid.  3.  History of smoking.  Counseled, he has refused nicotine patch.  4.  Underlying Parkinson's.  Continue carbidopa-levodopa.  PT OT consulted.  May require SNF if he agrees.  5.  History of COPD.  At baseline.  Supportive care   6.  CKD stage IIIb.  Baseline creatinine around 1.5.  At baseline continue to monitor.  7.  High D-dimer due to inflammation.  High risk for DVT, negative leg ultrasound, continue moderate dose Lovenox.   Placement and disposition has been challenging as patient is frequently changing his mind.  Social work looking for safe disposition.    Condition - Extremely Guarded  Family Communication  :  Steffanie Dunn [phone] on 03/04/2018 , Dr. Dorris Carnes POA on 03/04/2020.  Contacted brother on 03/07/2020.  Code Status :  DNR  Consults  :  None  Procedures  :    Leg ultrasound.  No DVT.  PUD Prophylaxis :   Disposition Plan  :    Status is: Inpatient  Remains inpatient  appropriate because:IV treatments appropriate due to intensity of illness or inability to take PO   Dispo: The patient is from: Home              Anticipated d/c is to: Home              Anticipated d/c date is: > 3 days              Patient currently is not medically stable to d/c.  DVT Prophylaxis  :  Lovenox    Lab Results  Component Value Date   PLT 106 (L) 03/06/2020    Diet :  Diet Order            Diet Carb Modified Fluid consistency: Thin; Room service appropriate? Yes  Diet effective now                  Inpatient Medications  Scheduled Meds: . aspirin EC  81 mg Oral Daily  . carbidopa-levodopa  1 tablet  Oral 3 times per day  . clopidogrel  75 mg Oral Daily  . enoxaparin (LOVENOX) injection  40 mg Subcutaneous Q12H  . Ipratropium-Albuterol  1 puff Inhalation Q6H  . lisinopril  10 mg Oral Daily  . rosuvastatin  20 mg Oral QHS   Continuous Infusions:  PRN Meds:.acetaminophen, chlorpheniramine-HYDROcodone, guaiFENesin-dextromethorphan, [DISCONTINUED] ondansetron **OR** ondansetron (ZOFRAN) IV  Antibiotics  :    Anti-infectives (From admission, onward)   Start     Dose/Rate Route Frequency Ordered Stop   03/04/20 1000  remdesivir 100 mg in sodium chloride 0.9 % 100 mL IVPB       "Followed by" Linked Group Details   100 mg 200 mL/hr over 30 Minutes Intravenous Daily 03/03/20 2110 03/07/20 1309   03/03/20 2115  remdesivir 200 mg in sodium chloride 0.9% 250 mL IVPB       "Followed by" Linked Group Details   200 mg 580 mL/hr over 30 Minutes Intravenous Once 03/03/20 2110 03/03/20 2334       Time Spent in minutes  30   Lala Lund M.D on 03/08/2020 at 11:08 AM  To page go to [email or web address]   [facility] -  Office  [phone]    See all Orders from today for further details    Objective:   Vitals:   03/07/20 0751 03/07/20 1652 03/07/20 2038 03/08/20 0600  BP: 126/72 (!) 160/72 136/67 (!) 152/86  Pulse: (!) 41 (!) 45 60 (!) 41  Resp: 18  20 20 16   Temp: 98.4 F (36.9 C) 98.6 F (37 C) 98.5 F (36.9 C) 98.2 F (36.8 C)  TempSrc:   Oral   SpO2: 92% 95% 95% 96%    Wt Readings from Last 3 Encounters:  10/13/19 59.9 kg  06/09/19 64 kg  03/16/19 64 kg     Intake/Output Summary (Last 24 hours) at 03/08/2020 1108 Last data filed at 03/08/2020 0603 Gross per 24 hour  Intake -  Output 1650 ml  Net -1650 ml     Physical Exam  Awake Alert, No new F.N deficits, Normal affect [place].AT,PERRAL Supple Neck,No JVD, No cervical lymphadenopathy appriciated.  Symmetrical Chest wall movement, Good air movement bilaterally, CTAB RRR,No Gallops, Rubs or new Murmurs, No Parasternal Heave +ve B.Sounds, Abd Soft, No tenderness, No organomegaly appriciated, No rebound - guarding or rigidity. No Cyanosis, Clubbing or edema, No new Rash or bruise     Data Review:    CBC Recent Labs  Lab 03/03/20 1616 03/04/20 0319 03/05/20 0614 03/06/20 0448  WBC 10.3 8.0 7.9 6.6  HGB 14.2 13.4 13.5 12.9*  HCT 43.0 41.4 39.5 37.3*  PLT 96* 95* 98* 106*  MCV 93.5 93.2 91.4 89.9  MCH 30.9 30.2 31.3 31.1  MCHC 33.0 32.4 34.2 34.6  RDW 14.4 14.4 14.5 14.5  LYMPHSABS  --  0.2* 0.3* 0.2*  MONOABS  --  0.3 0.7 0.4  EOSABS  --  0.0 0.0 0.0  BASOSABS  --  0.0 0.0 0.0    Recent Labs  Lab 03/03/20 1616 03/03/20 2057 03/04/20 0319 03/05/20 0614 03/05/20 0615 03/06/20 0448  NA 138  --  138 137  --  133*  K 4.0  --  4.1 4.3  --  4.4  CL 101  --  106 104  --  103  CO2 27  --  23 24  --  25  GLUCOSE 124*  --  153* 147*  --  156*  BUN 14  --  19 30*  --  33*  CREATININE 1.37*  --  1.37* 1.56*  --  1.26*  CALCIUM 8.9  --  8.5* 8.4*  --  7.9*  AST  --   --  16 21  --  22  ALT  --   --  <5 8  --  <5  ALKPHOS  --   --  62 56  --  48  BILITOT  --   --  0.5 0.8  --  0.7  ALBUMIN  --   --  2.9* 2.8*  --  2.7*  MG  --   --  1.8 1.7  --  1.8  CRP  --  1.0* 2.2* 3.3*  --  4.4*  DDIMER  --   --  1.94* 1.93*  --  1.29*  PROCALCITON  --  <0.10  --    --   --   --   TSH  --   --  0.345*  --   --   --   BNP  --   --  163.6*  --  126.8* 136.9*    ------------------------------------------------------------------------------------------------------------------ No results for input(s): CHOL, HDL, LDLCALC, TRIG, CHOLHDL, LDLDIRECT in the last 72 hours.  Lab Results  Component Value Date   HGBA1C 5.8 (H) 01/26/2018   ------------------------------------------------------------------------------------------------------------------ No results for input(s): TSH, T4TOTAL, T3FREE, THYROIDAB in the last 72 hours.  Invalid input(s): FREET3  Cardiac Enzymes No results for input(s): CKMB, TROPONINI, MYOGLOBIN in the last 168 hours.  Invalid input(s): CK ------------------------------------------------------------------------------------------------------------------    Component Value Date/Time   BNP 136.9 (H) 03/06/2020 0448    Micro Results Recent Results (from the past 240 hour(s))  Resp Panel by RT-PCR (Flu A&B, Covid) Nasopharyngeal Swab     Status: Abnormal   Collection Time: 03/03/20  3:29 PM   Specimen: Nasopharyngeal Swab; Nasopharyngeal(NP) swabs in vial transport medium  Result Value Ref Range Status   SARS Coronavirus 2 by RT PCR POSITIVE (A) NEGATIVE Final    Comment: RESULT CALLED TO, READ BACK BY AND VERIFIED WITH: RN C STRAUGHAN AT 1713 03/03/2020 BY L BENFIELD (NOTE) SARS-CoV-2 target nucleic acids are DETECTED.  The SARS-CoV-2 RNA is generally detectable in upper respiratory specimens during the acute phase of infection. Positive results are indicative of the presence of the identified virus, but do not rule out bacterial infection or co-infection with other pathogens not detected by the test. Clinical correlation with patient history and other diagnostic information is necessary to determine patient infection status. The expected result is Negative.  Fact Sheet for  Patients: EntrepreneurPulse.com.au  Fact Sheet for Healthcare Providers: IncredibleEmployment.be  This test is not yet approved or cleared by the [place] FDA and  has been authorized for detection and/or diagnosis of SARS-CoV-2 by FDA under an Emergency Use Authorization (EUA).  This EUA will remain in effect (meaning this  test can be used) for the duration of  the COVID-19 declaration under Section 564(b)(1) of the Act, 21 U.S.C. section 360bbb-3(b)(1), unless the authorization is terminated or revoked sooner.     Influenza A by PCR NEGATIVE NEGATIVE Final   Influenza B by PCR NEGATIVE NEGATIVE Final    Comment: (NOTE) The Xpert Xpress SARS-CoV-2/FLU/RSV plus assay is intended as an aid in the diagnosis of influenza from Nasopharyngeal swab specimens and should not be used as a sole basis for treatment. Nasal washings and aspirates are unacceptable for Xpert Xpress SARS-CoV-2/FLU/RSV testing.  Fact Sheet for Patients: EntrepreneurPulse.com.au  Fact Sheet for Healthcare Providers: IncredibleEmployment.be  This  test is not yet approved or cleared by the [place] and has been authorized for detection and/or diagnosis of SARS-CoV-2 by FDA under an Emergency Use Authorization (EUA). This EUA will remain in effect (meaning this test can be used) for the duration of the COVID-19 declaration under Section 564(b)(1) of the Act, 21 U.S.C. section 360bbb-3(b)(1), unless the authorization is terminated or revoked.  Performed at [facility], [place] [address] [place], [place] [address]     Radiology Reports DG Chest Port 1 View  Result Date: 03/03/2020 CLINICAL DATA:  Cough COVID EXAM: PORTABLE CHEST 1 VIEW COMPARISON:  08/20/2016 FINDINGS: Mild left retrocardiac opacity. No acute consolidation or pleural effusion. Emphysematous disease. Stable cardiomediastinal silhouette. No pneumothorax.  Probable skin fold artifact over the left lower lateral chest. IMPRESSION: Emphysematous disease. Patchy left retrocardiac opacity, atelectasis versus mild infiltrate. Electronically Signed   By: Donavan Foil M.D.   On: 03/03/2020 19:29   VAS [place] LOWER EXTREMITY VENOUS (DVT)  Result Date: 03/05/2020  Lower Venous DVT Study Other Indications: Covid, D-Dimer. Comparison Study: No previous Performing Technologist: Vonzell Schlatter RVT  Examination Guidelines: A complete evaluation includes B-mode imaging, spectral Doppler, color Doppler, and power Doppler as needed of all accessible portions of each vessel. Bilateral testing is considered an integral part of a complete examination. Limited examinations for reoccurring indications may be performed as noted. The reflux portion of the exam is performed with the patient in reverse Trendelenburg.  +---------+---------------+---------+-----------+----------+--------------+ RIGHT    CompressibilityPhasicitySpontaneityPropertiesThrombus Aging +---------+---------------+---------+-----------+----------+--------------+ CFV      Full           Yes      Yes                                 +---------+---------------+---------+-----------+----------+--------------+ SFJ      Full                                                        +---------+---------------+---------+-----------+----------+--------------+ FV Prox  Full                                                        +---------+---------------+---------+-----------+----------+--------------+ FV Mid   Full                                                        +---------+---------------+---------+-----------+----------+--------------+ FV DistalFull                                                        +---------+---------------+---------+-----------+----------+--------------+ PFV      Full                                                         +---------+---------------+---------+-----------+----------+--------------+  POP      Full           Yes      Yes                                 +---------+---------------+---------+-----------+----------+--------------+ PTV      Full                                                        +---------+---------------+---------+-----------+----------+--------------+ PERO     Full                                                        +---------+---------------+---------+-----------+----------+--------------+   +---------+---------------+---------+-----------+----------+--------------+ LEFT     CompressibilityPhasicitySpontaneityPropertiesThrombus Aging +---------+---------------+---------+-----------+----------+--------------+ CFV      Full           Yes      Yes                                 +---------+---------------+---------+-----------+----------+--------------+ SFJ      Full                                                        +---------+---------------+---------+-----------+----------+--------------+ FV Prox  Full                                                        +---------+---------------+---------+-----------+----------+--------------+ FV Mid   Full                                                        +---------+---------------+---------+-----------+----------+--------------+ FV DistalFull                                                        +---------+---------------+---------+-----------+----------+--------------+ PFV      Full                                                        +---------+---------------+---------+-----------+----------+--------------+ POP      Full           Yes      Yes                                 +---------+---------------+---------+-----------+----------+--------------+  PTV      Full                                                         +---------+---------------+---------+-----------+----------+--------------+ PERO     Full                                                        +---------+---------------+---------+-----------+----------+--------------+     Summary: RIGHT: - There is no evidence of deep vein thrombosis in the lower extremity.  - No cystic structure found in the popliteal fossa.  LEFT: - There is no evidence of deep vein thrombosis in the lower extremity.  - No cystic structure found in the popliteal fossa.  *See table(s) above for measurements and observations. Electronically signed by Ruta Hinds MD on 03/05/2020 at 12:12:59 PM.    Final

## 2020-03-08 NOTE — Plan of Care (Signed)
  Problem: Education: Goal: Knowledge of risk factors and measures for prevention of condition will improve Outcome: Progressing   Problem: Coping: Goal: Psychosocial and spiritual needs will be supported Outcome: Progressing   Problem: Respiratory: Goal: Will maintain a patent airway Outcome: Progressing Goal: Complications related to the disease process, condition or treatment will be avoided or minimized Outcome: Progressing   Problem: Education: Goal: Knowledge of General Education information will improve Description: Including pain rating scale, medication(s)/side effects and non-pharmacologic comfort measures Outcome: Progressing   Problem: Health Behavior/Discharge Planning: Goal: Ability to manage health-related needs will improve Outcome: Progressing   Problem: Clinical Measurements: Goal: Ability to maintain clinical measurements within normal limits will improve Outcome: Progressing Goal: Will remain free from infection Outcome: Progressing Goal: Diagnostic test results will improve Outcome: Progressing Goal: Respiratory complications will improve Outcome: Progressing   Problem: Activity: Goal: Risk for activity intolerance will decrease Outcome: Progressing   Problem: Nutrition: Goal: Adequate nutrition will be maintained Outcome: Progressing   Problem: Coping: Goal: Level of anxiety will decrease Outcome: Progressing   Problem: Elimination: Goal: Will not experience complications related to bowel motility Outcome: Progressing   Problem: Pain Managment: Goal: General experience of comfort will improve Outcome: Progressing   Problem: Safety: Goal: Ability to remain free from injury will improve Outcome: Progressing   Problem: Skin Integrity: Goal: Risk for impaired skin integrity will decrease Outcome: Progressing   

## 2020-03-08 NOTE — Evaluation (Signed)
Occupational Therapy Evaluation Patient Details Name: Donald Richardson MRN: [id number] DOB: 03/29/1940 Today's Date: 03/08/2020    History of Present Illness 80 y.o. male with hx of CVA, Parkinson's, bradycardia, tobacco abuse, cardiovascular disease, CKD three, non-insulin-dependent diabetes, COPD who presented to the hospital on 03/03/2020 after neighbors checked on him and noticed he was not himself, weak and cognitively altered, he was found to be Covid positive.   Clinical Impression   PTA, pt was living at home alone, he reports that he was independent with ADL/IADL and modified independent with functional mobility at RW level. Per PT evaluation, pt has a pca who assists with dressing, bathing, IADL. However, pt stated today that the pca "doesn't do anything for me".  Pt currently requires minguard for sit<>stand transfers from different surfaces, he requested therapist to support RW despite cues to pushup from the bed. Pt demonstrates poor safety awareness and poor management of RW during functional mobility, requiring cues for proper use. Pt required minguard assistance for standing grooming task. Difficult to get a good wave form during session, but pt did not show any signs of distress or DoE with ADL or ambulation in the hallway (about 44feet).  Due to decline in current level of function, pt would benefit from acute OT to address established goals to facilitate safe D/C to venue listed below. At this time, recommend HHOTfollow-up. Will continue to follow acutely.     Follow Up Recommendations  Supervision/Assistance - 24 hour;Home health OT    Equipment Recommendations  3 in 1 bedside commode    Recommendations for Other Services       Precautions / Restrictions Precautions Precautions: Fall Restrictions Weight Bearing Restrictions: No      Mobility Bed Mobility Overal bed mobility: Needs Assistance Bed Mobility: Supine to Sit;Sit to Supine     Supine to sit: Supervision Sit  to supine: Supervision   General bed mobility comments: increased time and effort, no physical assistance from therapist    Transfers Overall transfer level: Needs assistance Equipment used: Rolling walker (2 wheeled) Transfers: Sit to/from Stand Sit to Stand: Min guard         General transfer comment: Min guard assist for safety and balance during transition up.    Balance Overall balance assessment: Needs assistance Sitting-balance support: Feet supported;No upper extremity supported Sitting balance-Leahy Scale: Fair Sitting balance - Comments: reliant on unilateral UE support   Standing balance support: Single extremity supported;During functional activity Standing balance-Leahy Scale: Poor Standing balance comment: able to static stand briefly (~30seconds) otherwise requires at least single UE support                           ADL either performed or assessed with clinical judgement   ADL Overall ADL's : Needs assistance/impaired Eating/Feeding: Set up;Sitting   Grooming: Min guard;Standing Grooming Details (indicate cue type and reason): pt completed oral care standing at sink Upper Body Bathing: Set up;Sitting   Lower Body Bathing: Min guard;Sit to/from stand Lower Body Bathing Details (indicate cue type and reason): pt required therapist to support RW despite cues to push up from the bed Upper Body Dressing : Set up;Sitting   Lower Body Dressing: Min guard;Sit to/from stand   Toilet Transfer: Min guard;Ambulation;RW Toilet Transfer Details (indicate cue type and reason): pt with poor management of RW, required cues to stand within RW;pt with kyphotic posture, Toileting- Clothing Manipulation and Hygiene: Min guard;Sit to/from stand  Functional mobility during ADLs: Rolling walker;Cueing for safety;Min guard General ADL Comments: pt with poor management of RW, minguard for stability with minimal prompting for proper use of RW     Vision          Perception     Praxis      Pertinent Vitals/Pain Pain Assessment: No/denies pain Faces Pain Scale: No hurt     Hand Dominance Right   Extremity/Trunk Assessment Upper Extremity Assessment Upper Extremity Assessment: Generalized weakness   Lower Extremity Assessment Lower Extremity Assessment: Generalized weakness   Cervical / Trunk Assessment Cervical / Trunk Assessment: Kyphotic   Communication Communication Communication: HOH   Cognition Arousal/Alertness: Awake/alert Behavior During Therapy: WFL for tasks assessed/performed Overall Cognitive Status: No family/caregiver present to determine baseline cognitive functioning                                 General Comments: difficult to assess secondary to pt HOH, pt declined using hearing aids as all he hears is static due to negative pressure duct;oriented x4, appears to have poor safety awareness (anticipate this is pt's baseline)   General Comments  Pt with 2x cough 1 at rest in bed, the other with ambulation;unable to get a good waveform, pt did not present with any DoE or distress during functional mobility and ADL    Exercises Exercises: Other exercises Other Exercises Other Exercises: Pt able to demonstrate IS x 10 reps each.  Good technique and pt able to get 1700 mL max.   Shoulder Instructions      Home Living Family/patient expects to be discharged to:: Private residence Living Arrangements: Alone Available Help at Discharge: Friend(s);Personal care attendant (PCA 11AM-6PM mon-thur and sat) Type of Home: House Home Access: Stairs to enter [employer] of Steps: 2 Entrance Stairs-Rails: None Home Layout: One level     Bathroom Shower/Tub: Chief Strategy Officer: Standard     Home Equipment: Environmental consultant - 2 wheels          Prior Functioning/Environment Level of Independence: Needs assistance  Gait / Transfers Assistance Needed: pt ambulates household distances  with use of RW ADL's / Homemaking Assistance Needed: pt reports his PCA does not do anything for him, however, per PT eval pt requires assistance with bathing and dressing, all meals are obtained at restaurants. Pt does not drive Communication / Swallowing Assistance Needed: HOH          OT Problem List: Decreased activity tolerance;Decreased safety awareness;Cardiopulmonary status limiting activity;Decreased knowledge of use of DME or AE;Decreased cognition;Impaired balance (sitting and/or standing)      OT Treatment/Interventions: Self-care/ADL training;Therapeutic exercise;Energy conservation;DME and/or AE instruction;Therapeutic activities;Cognitive remediation/compensation;Patient/family education;Balance training    OT Goals(Current goals can be found in the care plan section) Acute Rehab OT Goals Patient Stated Goal: to return to baseline and regain strength OT Goal Formulation: With patient Time For Goal Achievement: 03/22/20 Potential to Achieve Goals: Good ADL Goals Pt Will Perform Grooming: with modified independence;standing Pt Will Perform Lower Body Dressing: with supervision;sit to/from stand Pt Will Transfer to Toilet: with modified independence;ambulating Additional ADL Goal #1: Pt will demonstrate independence with 3 energy conservation strategies during ADL completion. Additional ADL Goal #2: Pt will demonstrate independence with 3 fall prevention strategies.  OT Frequency: Min 2X/week   Barriers to D/C:            Co-evaluation  AM-PAC OT "6 Clicks" Daily Activity     Outcome Measure Help from another person eating meals?: None Help from another person taking care of personal grooming?: A Little Help from another person toileting, which includes using toliet, bedpan, or urinal?: A Little Help from another person bathing (including washing, rinsing, drying)?: A Little Help from another person to put on and taking off regular upper body  clothing?: A Little Help from another person to put on and taking off regular lower body clothing?: A Little 6 Click Score: 19   End of Session Equipment Utilized During Treatment: Rolling walker Nurse Communication: Mobility status  Activity Tolerance: Patient tolerated treatment well Patient left: in chair;with call bell/phone within reach  OT Visit Diagnosis: Unsteadiness on feet (R26.81);Other abnormalities of gait and mobility (R26.89);Muscle weakness (generalized) (M62.81);Other symptoms and signs involving cognitive function                Time: 1539-1620 OT Time Calculation (min): 41 min Charges:  OT General Charges $OT Visit: 1 Visit OT Evaluation $OT Eval Moderate Complexity: 1 Mod OT Treatments $Self Care/Home Management : 8-22 mins $Therapeutic Activity: 8-22 mins  Rosey Bath OTR/L Acute Rehabilitation Services Office: [phone]   Rebeca Alert 03/08/2020, 4:34 PM

## 2020-03-08 NOTE — Consult Note (Signed)
   [facility] Inpatient Consult   03/08/2020  Donald Richardson Dec 25, 1940 [id number]   [facility] [[facility]]  Accountable Care Organization [ACO] Patient:  Medicare   Patient screened for COVID hospitalization and reviewed for potential barriers and for potential [facility]  [[facility]] Care Management service needs.  Review of patient's medical record reveals patient is recommended for SNF however initially refusing and wants home with [place] and PCS. Reviewed inpatient [facility] RNCM notes and issues for returning home noted.  Primary Care Provider is Ronnald Nian, MD this provider is listed to provide the transition of care [TOC] for post hospital follow up when appropriate.  Plan:  Reached out to inpatient [facility] RNCM  regarding any [facility] follow up needs and/or benefits.  Continue to follow progress and disposition to assess for post hospital care management needs.  If patient goes to a [facility] affiliated facility can alert [facility] nurse of ongoing post facility potential TOC needs.  For questions contact:   Charlesetta Shanks, RN BSN CCM [facility]  [phone] business mobile phone Toll free office [phone]  Fax number: [phone] [email or web address]

## 2020-03-09 DIAGNOSIS — U071 COVID-19: Secondary | ICD-10-CM | POA: Diagnosis not present

## 2020-03-09 LAB — GLUCOSE, CAPILLARY
Glucose-Capillary: 83 mg/dL (ref 70–99)
Glucose-Capillary: 109 mg/dL — ABNORMAL HIGH (ref 70–99)
Glucose-Capillary: 167 mg/dL — ABNORMAL HIGH (ref 70–99)
Glucose-Capillary: 141 mg/dL — ABNORMAL HIGH (ref 70–99)

## 2020-03-09 NOTE — Progress Notes (Signed)
Physical Therapy Treatment Patient Details Name: Donald Richardson MRN: [id number] DOB: 07/18/1940 Today's Date: 03/09/2020    History of Present Illness 80 y.o. male with hx of CVA, Parkinson's, bradycardia, tobacco abuse, cardiovascular disease, CKD three, non-insulin-dependent diabetes, COPD who presented to the hospital on 03/03/2020 after neighbors checked on him and noticed he was not himself, weak and cognitively altered, he was found to be Covid positive.    PT Comments    Pt continues to progress gait and mobility with RW, showing no signs of respiratory distress with VSS throughout gait down the hallway and back.  Pt demonstrated good use of IS and flutter valve, however had to be reminded of frequency of use.  PT will continue to follow acutely for safe mobility progression.  Follow Up Recommendations  SNF;Other (comment) (pt initially refusing SNF now saying "they say I have to go")     Equipment Recommendations  None recommended by PT    Recommendations for Other Services       Precautions / Restrictions Precautions Precautions: Fall Precaution Comments: likely fall risk at baseline with h/o Parkinson's    Mobility  Bed Mobility Overal bed mobility: Needs Assistance Bed Mobility: Supine to Sit     Supine to sit: Min guard     General bed mobility comments: min guard assist to help prevent posterior LOB while scooting EOB on compliant mattress.  Transfers Overall transfer level: Needs assistance Equipment used: Rolling walker (2 wheeled) Transfers: Sit to/from Stand Sit to Stand: Min guard         General transfer comment: min guard assist for safety and balance  Ambulation/Gait Ambulation/Gait assistance: Min guard Gait Distance (Feet): 150 Feet Assistive device: Rolling walker (2 wheeled) Gait Pattern/deviations: Step-through pattern;Shuffle;Trunk flexed Gait velocity: decreased Gait velocity interpretation: <1.8 ft/sec, indicate of risk for recurrent  falls General Gait Details: Pt with Parkinsonian gait pattern, shuffles, needs cues to get closer to RW, lots of steps while turning and difficulty backing up.  Min guard assist for safety.   Stairs             Wheelchair Mobility    Modified Rankin (Stroke Patients Only)       Balance Overall balance assessment: Needs assistance Sitting-balance support: Feet supported;Bilateral upper extremity supported Sitting balance-Leahy Scale: Poor Sitting balance - Comments: reliant today on UE support EOB   Standing balance support: Bilateral upper extremity supported;Single extremity supported Standing balance-Leahy Scale: Poor Standing balance comment: reliant on at least one UE support and support from PT                            Cognition Arousal/Alertness: Awake/alert Behavior During Therapy: WFL for tasks assessed/performed Overall Cognitive Status: Within Functional Limits for tasks assessed                                 General Comments: generally intact, also HOH which decreases comprehension      Exercises Other Exercises Other Exercises: reinforced IS and flutter valve use every hour x10 each.  Pt demonstrated 10 each during session with good technique pulling 1040mL max inspired volume.    General Comments General comments (skin integrity, edema, etc.): VSS on RA throughout gait, walking and talking, no DOE      Pertinent Vitals/Pain Pain Assessment: No/denies pain    Home Living  Prior Function            PT Goals (current goals can now be found in the care plan section) Acute Rehab PT Goals Patient Stated Goal: to go home Progress towards PT goals: Progressing toward goals    Frequency    Min 3X/week      PT Plan Current plan remains appropriate    Co-evaluation              AM-PAC PT "6 Clicks" Mobility   Outcome Measure  Help needed turning from your back to your side  while in a flat bed without using bedrails?: A Little Help needed moving from lying on your back to sitting on the side of a flat bed without using bedrails?: A Little Help needed moving to and from a bed to a chair (including a wheelchair)?: A Little Help needed standing up from a chair using your arms (e.g., wheelchair or bedside chair)?: A Little Help needed to walk in hospital room?: A Little Help needed climbing 3-5 steps with a railing? : A Little 6 Click Score: 18    End of Session Equipment Utilized During Treatment: Gait belt Activity Tolerance: Patient tolerated treatment well Patient left: in chair;with call bell/phone within reach;with chair alarm set   PT Visit Diagnosis: Muscle weakness (generalized) (M62.81)     Time: 4315-4008 PT Time Calculation (min) (ACUTE ONLY): 32 min  Charges:  $Gait Training: 8-22 mins $Therapeutic Activity: 8-22 mins                     Verdene Lennert, PT, DPT  Acute Rehabilitation [phone] pager [phone]) [phone] office

## 2020-03-09 NOTE — Plan of Care (Signed)
  Problem: Education: Goal: Knowledge of risk factors and measures for prevention of condition will improve Outcome: Progressing   Problem: Coping: Goal: Psychosocial and spiritual needs will be supported Outcome: Progressing   Problem: Respiratory: Goal: Will maintain a patent airway Outcome: Progressing Goal: Complications related to the disease process, condition or treatment will be avoided or minimized Outcome: Progressing   Problem: Education: Goal: Knowledge of General Education information will improve Description: Including pain rating scale, medication(s)/side effects and non-pharmacologic comfort measures Outcome: Progressing   Problem: Health Behavior/Discharge Planning: Goal: Ability to manage health-related needs will improve Outcome: Progressing   Problem: Clinical Measurements: Goal: Ability to maintain clinical measurements within normal limits will improve Outcome: Progressing Goal: Will remain free from infection Outcome: Progressing Goal: Diagnostic test results will improve Outcome: Progressing Goal: Respiratory complications will improve Outcome: Progressing   Problem: Activity: Goal: Risk for activity intolerance will decrease Outcome: Progressing   Problem: Nutrition: Goal: Adequate nutrition will be maintained Outcome: Progressing   Problem: Coping: Goal: Level of anxiety will decrease Outcome: Progressing   Problem: Elimination: Goal: Will not experience complications related to bowel motility Outcome: Progressing   Problem: Pain Managment: Goal: General experience of comfort will improve Outcome: Progressing   Problem: Safety: Goal: Ability to remain free from injury will improve Outcome: Progressing   Problem: Skin Integrity: Goal: Risk for impaired skin integrity will decrease Outcome: Progressing   

## 2020-03-09 NOTE — Progress Notes (Signed)
PROGRESS NOTE                                                                                                                                                                                                             Patient Demographics:    Donald Richardson, is a 80 y.o. male, DOB - 1940/07/29, LE:[id number]  Outpatient Primary MD for the patient is Denita Lung, MD    LOS - 6  Admit date - 03/03/2020    Chief Complaint  Patient presents with  . Weakness       Brief Narrative (HPI from H&P) -  Donald Richardson is a 80 y.o. male with hx of CVA, Parkinson's, bradycardia, tobacco abuse, cardiovascular disease, CKD three, non-insulin-dependent diabetes, COPD who presented to the hospital on 03/03/2020 after neighbors checked on him and noticed he was not himself and he was found to be Covid positive, in the ER he found to Covid PNA, sinus bradycardia, dehydration and he was admitted to the hospital   Subjective:   Patient in bed, appears comfortable, denies any headache, no fever, no chest pain or pressure, no shortness of breath , no abdominal pain. No focal weakness.   Assessment  & Plan :     1. Acute Hypoxic Resp. Failure due to Acute Covid 19 Viral Pneumonitis during the ongoing 2020 Covid 19 Pandemic - he has received 2 shots of mRNA vaccine with last dose in February 2021 however has not received his third booster shot, he seems to have incurred moderate parenchymal lung disease, has been placed on steroids and Remdesivir course, responding very well to it.  Remains stable, clinically better await placement.  Encouraged the patient to sit up in chair in the daytime use I-S and flutter valve for pulmonary toiletry and then prone in bed when at night.  Will advance activity and titrate down oxygen as possible.   Recent Labs  Lab 03/03/20 1529 03/03/20 1616 03/03/20 2057 03/04/20 0319 03/05/20 0614 03/05/20 0615  03/06/20 0448  WBC  --  10.3  --  8.0 7.9  --  6.6  HGB  --  14.2  --  13.4 13.5  --  12.9*  HCT  --  43.0  --  41.4 39.5  --  37.3*  PLT  --  96*  --  95* 98*  --  106*  CRP  --   --  1.0* 2.2* 3.3*  --  4.4*  BNP  --   --   --  163.6*  --  126.8* 136.9*  DDIMER  --   --   --  1.94* 1.93*  --  1.29*  PROCALCITON  --   --  <0.10  --   --   --   --   AST  --   --   --  16 21  --  22  ALT  --   --   --  <5 8  --  <5  ALKPHOS  --   --   --  62 56  --  48  BILITOT  --   --   --  0.5 0.8  --  0.7  ALBUMIN  --   --   --  2.9* 2.8*  --  2.7*  SARSCOV2NAA POSITIVE*  --   --   --   --   --   --     Lab Results  Component Value Date   TSH 0.345 (L) 03/04/2020    2. 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  Also discussed with his POA Dr. Wyatt Haste cardiologist, his TSH if anything is mildly suppressed suggesting sick euthyroid.  3.  History of smoking.  Counseled, he has refused nicotine patch.  4.  Underlying Parkinson's.  Continue carbidopa-levodopa.  PT OT consulted.  May require SNF if he agrees.  5.  History of COPD.  At baseline.  Supportive care   6.  CKD stage IIIb.  Baseline creatinine around 1.5.  At baseline continue to monitor.  7.  High D-dimer due to inflammation.  High risk for DVT, negative leg ultrasound, continue moderate dose Lovenox.   Placement and disposition has been challenging as patient is frequently changing his mind.  Social work looking for safe disposition.    Condition - Extremely Guarded  Family Communication  :  Donald Richardson [phone] on 03/04/2018 , Dr. Dorris Carnes POA on 03/04/2020.  Contacted brother on 03/07/2020.  Code Status :  DNR  Consults  :  None  Procedures  :    Leg ultrasound.  No DVT.  PUD Prophylaxis :   Disposition Plan  :    Status is: Inpatient  Remains inpatient  appropriate because:IV treatments appropriate due to intensity of illness or inability to take PO   Dispo: The patient is from: Home              Anticipated d/c is to: Home              Anticipated d/c date is: > 3 days              Patient currently is not medically stable to d/c.  DVT Prophylaxis  :  Lovenox    Lab Results  Component Value Date   PLT 106 (L) 03/06/2020    Diet :  Diet Order            Diet Carb Modified Fluid consistency: Thin; Room service appropriate? Yes  Diet effective now                  Inpatient Medications  Scheduled Meds: . aspirin EC  81 mg Oral Daily  . carbidopa-levodopa  1 tablet  Oral 3 times per day  . clopidogrel  75 mg Oral Daily  . enoxaparin (LOVENOX) injection  40 mg Subcutaneous Q12H  . Ipratropium-Albuterol  1 puff Inhalation Q6H  . lisinopril  10 mg Oral Daily  . rosuvastatin  20 mg Oral QHS   Continuous Infusions:  PRN Meds:.acetaminophen, chlorpheniramine-HYDROcodone, guaiFENesin-dextromethorphan, [DISCONTINUED] ondansetron **OR** ondansetron (ZOFRAN) IV  Antibiotics  :    Anti-infectives (From admission, onward)   Start     Dose/Rate Route Frequency Ordered Stop   03/04/20 1000  remdesivir 100 mg in sodium chloride 0.9 % 100 mL IVPB       "Followed by" Linked Group Details   100 mg 200 mL/hr over 30 Minutes Intravenous Daily 03/03/20 2110 03/07/20 1309   03/03/20 2115  remdesivir 200 mg in sodium chloride 0.9% 250 mL IVPB       "Followed by" Linked Group Details   200 mg 580 mL/hr over 30 Minutes Intravenous Once 03/03/20 2110 03/03/20 2334       Time Spent in minutes  30   Lala Lund M.D on 03/09/2020 at 11:03 AM  To page go to [email or web address]   [facility] -  Office  [phone]    See all Orders from today for further details    Objective:   Vitals:   03/08/20 0600 03/08/20 1702 03/08/20 2144 03/09/20 0608  BP: (!) 152/86 (!) 152/70 124/72 (!) 143/73  Pulse: (!) 41 (!) 44 (!) 45 (!) 45   Resp: 16 18 18 18   Temp: 98.2 F (36.8 C) 98.6 F (37 C) 98.4 F (36.9 C) 98.7 F (37.1 C)  TempSrc:   Oral Oral  SpO2: 96% 97% 92% 95%    Wt Readings from Last 3 Encounters:  10/13/19 59.9 kg  06/09/19 64 kg  03/16/19 64 kg     Intake/Output Summary (Last 24 hours) at 03/09/2020 1103 Last data filed at 03/09/2020 0609 Gross per 24 hour  Intake --  Output 1200 ml  Net -1200 ml     Physical Exam  Awake Alert, No new F.N deficits, Normal affect [place].AT,PERRAL Supple Neck,No JVD, No cervical lymphadenopathy appriciated.  Symmetrical Chest wall movement, Good air movement bilaterally, CTAB RRR,No Gallops, Rubs or new Murmurs, No Parasternal Heave +ve B.Sounds, Abd Soft, No tenderness, No organomegaly appriciated, No rebound - guarding or rigidity. No Cyanosis, Clubbing or edema, No new Rash or bruise    Data Review:    CBC Recent Labs  Lab 03/03/20 1616 03/04/20 0319 03/05/20 0614 03/06/20 0448  WBC 10.3 8.0 7.9 6.6  HGB 14.2 13.4 13.5 12.9*  HCT 43.0 41.4 39.5 37.3*  PLT 96* 95* 98* 106*  MCV 93.5 93.2 91.4 89.9  MCH 30.9 30.2 31.3 31.1  MCHC 33.0 32.4 34.2 34.6  RDW 14.4 14.4 14.5 14.5  LYMPHSABS  --  0.2* 0.3* 0.2*  MONOABS  --  0.3 0.7 0.4  EOSABS  --  0.0 0.0 0.0  BASOSABS  --  0.0 0.0 0.0    Recent Labs  Lab 03/03/20 1616 03/03/20 2057 03/04/20 0319 03/05/20 0614 03/05/20 0615 03/06/20 0448  NA 138  --  138 137  --  133*  K 4.0  --  4.1 4.3  --  4.4  CL 101  --  106 104  --  103  CO2 27  --  23 24  --  25  GLUCOSE 124*  --  153* 147*  --  156*  BUN 14  --  19 30*  --  33*  CREATININE 1.37*  --  1.37* 1.56*  --  1.26*  CALCIUM 8.9  --  8.5* 8.4*  --  7.9*  AST  --   --  16 21  --  22  ALT  --   --  <5 8  --  <5  ALKPHOS  --   --  62 56  --  48  BILITOT  --   --  0.5 0.8  --  0.7  ALBUMIN  --   --  2.9* 2.8*  --  2.7*  MG  --   --  1.8 1.7  --  1.8  CRP  --  1.0* 2.2* 3.3*  --  4.4*  DDIMER  --   --  1.94* 1.93*  --  1.29*  PROCALCITON   --  <0.10  --   --   --   --   TSH  --   --  0.345*  --   --   --   BNP  --   --  163.6*  --  126.8* 136.9*    ------------------------------------------------------------------------------------------------------------------ No results for input(s): CHOL, HDL, LDLCALC, TRIG, CHOLHDL, LDLDIRECT in the last 72 hours.  Lab Results  Component Value Date   HGBA1C 5.8 (H) 01/26/2018   ------------------------------------------------------------------------------------------------------------------ No results for input(s): TSH, T4TOTAL, T3FREE, THYROIDAB in the last 72 hours.  Invalid input(s): FREET3  Cardiac Enzymes No results for input(s): CKMB, TROPONINI, MYOGLOBIN in the last 168 hours.  Invalid input(s): CK ------------------------------------------------------------------------------------------------------------------    Component Value Date/Time   BNP 136.9 (H) 03/06/2020 0448    Micro Results Recent Results (from the past 240 hour(s))  Resp Panel by RT-PCR (Flu A&B, Covid) Nasopharyngeal Swab     Status: Abnormal   Collection Time: 03/03/20  3:29 PM   Specimen: Nasopharyngeal Swab; Nasopharyngeal(NP) swabs in vial transport medium  Result Value Ref Range Status   SARS Coronavirus 2 by RT PCR POSITIVE (A) NEGATIVE Final    Comment: RESULT CALLED TO, READ BACK BY AND VERIFIED WITH: RN C STRAUGHAN AT 1713 03/03/2020 BY L BENFIELD (NOTE) SARS-CoV-2 target nucleic acids are DETECTED.  The SARS-CoV-2 RNA is generally detectable in upper respiratory specimens during the acute phase of infection. Positive results are indicative of the presence of the identified virus, but do not rule out bacterial infection or co-infection with other pathogens not detected by the test. Clinical correlation with patient history and other diagnostic information is necessary to determine patient infection status. The expected result is Negative.  Fact Sheet for  Patients: EntrepreneurPulse.com.au  Fact Sheet for Healthcare Providers: IncredibleEmployment.be  This test is not yet approved or cleared by the [place] FDA and  has been authorized for detection and/or diagnosis of SARS-CoV-2 by FDA under an Emergency Use Authorization (EUA).  This EUA will remain in effect (meaning this  test can be used) for the duration of  the COVID-19 declaration under Section 564(b)(1) of the Act, 21 U.S.C. section 360bbb-3(b)(1), unless the authorization is terminated or revoked sooner.     Influenza A by PCR NEGATIVE NEGATIVE Final   Influenza B by PCR NEGATIVE NEGATIVE Final    Comment: (NOTE) The Xpert Xpress SARS-CoV-2/FLU/RSV plus assay is intended as an aid in the diagnosis of influenza from Nasopharyngeal swab specimens and should not be used as a sole basis for treatment. Nasal washings and aspirates are unacceptable for Xpert Xpress SARS-CoV-2/FLU/RSV testing.  Fact Sheet for Patients: EntrepreneurPulse.com.au  Fact Sheet for Healthcare Providers: IncredibleEmployment.be  This  test is not yet approved or cleared by the [place] and has been authorized for detection and/or diagnosis of SARS-CoV-2 by FDA under an Emergency Use Authorization (EUA). This EUA will remain in effect (meaning this test can be used) for the duration of the COVID-19 declaration under Section 564(b)(1) of the Act, 21 U.S.C. section 360bbb-3(b)(1), unless the authorization is terminated or revoked.  Performed at [facility], [place] [address] [place], [place] [address]     Radiology Reports DG Chest Port 1 View  Result Date: 03/03/2020 CLINICAL DATA:  Cough COVID EXAM: PORTABLE CHEST 1 VIEW COMPARISON:  08/20/2016 FINDINGS: Mild left retrocardiac opacity. No acute consolidation or pleural effusion. Emphysematous disease. Stable cardiomediastinal silhouette. No pneumothorax.  Probable skin fold artifact over the left lower lateral chest. IMPRESSION: Emphysematous disease. Patchy left retrocardiac opacity, atelectasis versus mild infiltrate. Electronically Signed   By: Donavan Foil M.D.   On: 03/03/2020 19:29   VAS [place] LOWER EXTREMITY VENOUS (DVT)  Result Date: 03/05/2020  Lower Venous DVT Study Other Indications: Covid, D-Dimer. Comparison Study: No previous Performing Technologist: Vonzell Schlatter RVT  Examination Guidelines: A complete evaluation includes B-mode imaging, spectral Doppler, color Doppler, and power Doppler as needed of all accessible portions of each vessel. Bilateral testing is considered an integral part of a complete examination. Limited examinations for reoccurring indications may be performed as noted. The reflux portion of the exam is performed with the patient in reverse Trendelenburg.  +---------+---------------+---------+-----------+----------+--------------+ RIGHT    CompressibilityPhasicitySpontaneityPropertiesThrombus Aging +---------+---------------+---------+-----------+----------+--------------+ CFV      Full           Yes      Yes                                 +---------+---------------+---------+-----------+----------+--------------+ SFJ      Full                                                        +---------+---------------+---------+-----------+----------+--------------+ FV Prox  Full                                                        +---------+---------------+---------+-----------+----------+--------------+ FV Mid   Full                                                        +---------+---------------+---------+-----------+----------+--------------+ FV DistalFull                                                        +---------+---------------+---------+-----------+----------+--------------+ PFV      Full                                                         +---------+---------------+---------+-----------+----------+--------------+  POP      Full           Yes      Yes                                 +---------+---------------+---------+-----------+----------+--------------+ PTV      Full                                                        +---------+---------------+---------+-----------+----------+--------------+ PERO     Full                                                        +---------+---------------+---------+-----------+----------+--------------+   +---------+---------------+---------+-----------+----------+--------------+ LEFT     CompressibilityPhasicitySpontaneityPropertiesThrombus Aging +---------+---------------+---------+-----------+----------+--------------+ CFV      Full           Yes      Yes                                 +---------+---------------+---------+-----------+----------+--------------+ SFJ      Full                                                        +---------+---------------+---------+-----------+----------+--------------+ FV Prox  Full                                                        +---------+---------------+---------+-----------+----------+--------------+ FV Mid   Full                                                        +---------+---------------+---------+-----------+----------+--------------+ FV DistalFull                                                        +---------+---------------+---------+-----------+----------+--------------+ PFV      Full                                                        +---------+---------------+---------+-----------+----------+--------------+ POP      Full           Yes      Yes                                 +---------+---------------+---------+-----------+----------+--------------+  PTV      Full                                                         +---------+---------------+---------+-----------+----------+--------------+ PERO     Full                                                        +---------+---------------+---------+-----------+----------+--------------+     Summary: RIGHT: - There is no evidence of deep vein thrombosis in the lower extremity.  - No cystic structure found in the popliteal fossa.  LEFT: - There is no evidence of deep vein thrombosis in the lower extremity.  - No cystic structure found in the popliteal fossa.  *See table(s) above for measurements and observations. Electronically signed by Ruta Hinds MD on 03/05/2020 at 12:12:59 PM.    Final

## 2020-03-10 DIAGNOSIS — U071 COVID-19: Secondary | ICD-10-CM | POA: Diagnosis not present

## 2020-03-10 LAB — GLUCOSE, CAPILLARY
Glucose-Capillary: 94 mg/dL (ref 70–99)
Glucose-Capillary: 141 mg/dL — ABNORMAL HIGH (ref 70–99)
Glucose-Capillary: 146 mg/dL — ABNORMAL HIGH (ref 70–99)
Glucose-Capillary: 170 mg/dL — ABNORMAL HIGH (ref 70–99)

## 2020-03-10 LAB — CREATININE, SERUM
Creatinine, Ser: 1.38 mg/dL — ABNORMAL HIGH (ref 0.61–1.24)
GFR, Estimated: 52 mL/min — ABNORMAL LOW (ref >60)

## 2020-03-10 NOTE — Plan of Care (Signed)
  Problem: Education: Goal: Knowledge of risk factors and measures for prevention of condition will improve Outcome: Progressing   Problem: Coping: Goal: Psychosocial and spiritual needs will be supported Outcome: Progressing   Problem: Respiratory: Goal: Will maintain a patent airway Outcome: Progressing Goal: Complications related to the disease process, condition or treatment will be avoided or minimized Outcome: Progressing   Problem: Education: Goal: Knowledge of General Education information will improve Description: Including pain rating scale, medication(s)/side effects and non-pharmacologic comfort measures Outcome: Progressing   Problem: Health Behavior/Discharge Planning: Goal: Ability to manage health-related needs will improve Outcome: Progressing   Problem: Clinical Measurements: Goal: Ability to maintain clinical measurements within normal limits will improve Outcome: Progressing Goal: Will remain free from infection Outcome: Progressing Goal: Diagnostic test results will improve Outcome: Progressing Goal: Respiratory complications will improve Outcome: Progressing   Problem: Activity: Goal: Risk for activity intolerance will decrease Outcome: Progressing   Problem: Nutrition: Goal: Adequate nutrition will be maintained Outcome: Progressing   Problem: Coping: Goal: Level of anxiety will decrease Outcome: Progressing   Problem: Elimination: Goal: Will not experience complications related to bowel motility Outcome: Progressing   Problem: Pain Managment: Goal: General experience of comfort will improve Outcome: Progressing   Problem: Safety: Goal: Ability to remain free from injury will improve Outcome: Progressing   Problem: Skin Integrity: Goal: Risk for impaired skin integrity will decrease Outcome: Progressing

## 2020-03-10 NOTE — Progress Notes (Signed)
PROGRESS NOTE                                                                                                                                                                                                             Patient Demographics:    Donald Richardson, is a 80 y.o. male, DOB - 1941-02-15, OVF:[id number]  Outpatient Primary MD for the patient is Denita Lung, MD    LOS - 7  Admit date - 03/03/2020    Chief Complaint  Patient presents with  . Weakness       Brief Narrative (HPI from H&P) -  Donald Richardson is a 80 y.o. male with hx of CVA, Parkinson's, bradycardia, tobacco abuse, cardiovascular disease, CKD three, non-insulin-dependent diabetes, COPD who presented to the hospital on 03/03/2020 after neighbors checked on him and noticed he was not himself and he was found to be Covid positive, in the ER he found to Covid PNA, sinus bradycardia, dehydration and he was admitted to the hospital   Subjective:   Patient in bed, appears comfortable, denies any headache, no fever, no chest pain or pressure, no shortness of breath , no abdominal pain. No focal weakness.   Assessment  & Plan :     1. Acute Hypoxic Resp. Failure due to Acute Covid 19 Viral Pneumonitis during the ongoing 2020 Covid 19 Pandemic - he has received 2 shots of mRNA vaccine with last dose in February 2021 however has not received his third booster shot, he seems to have incurred moderate parenchymal lung disease, has been placed on steroids and Remdesivir course, responding very well to it.  Remains stable, clinically better await placement.  Encouraged the patient to sit up in chair in the daytime use I-S and flutter valve for pulmonary toiletry and then prone in bed when at night.  Will advance activity and titrate down oxygen as possible.   Recent Labs  Lab 03/03/20 1529 03/03/20 1616 03/03/20 2057 03/04/20 0319 03/05/20 0614 03/05/20 0615  03/06/20 0448  WBC  --  10.3  --  8.0 7.9  --  6.6  HGB  --  14.2  --  13.4 13.5  --  12.9*  HCT  --  43.0  --  41.4 39.5  --  37.3*  PLT  --  96*  --  95* 98*  --  106*  CRP  --   --  1.0* 2.2* 3.3*  --  4.4*  BNP  --   --   --  163.6*  --  126.8* 136.9*  DDIMER  --   --   --  1.94* 1.93*  --  1.29*  PROCALCITON  --   --  <0.10  --   --   --   --   AST  --   --   --  16 21  --  22  ALT  --   --   --  <5 8  --  <5  ALKPHOS  --   --   --  62 56  --  48  BILITOT  --   --   --  0.5 0.8  --  0.7  ALBUMIN  --   --   --  2.9* 2.8*  --  2.7*  SARSCOV2NAA POSITIVE*  --   --   --   --   --   --     Lab Results  Component Value Date   TSH 0.345 (L) 03/04/2020    2. 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  Also discussed with his POA Dr. Wyatt Haste cardiologist, his TSH if anything is mildly suppressed suggesting sick euthyroid.  3.  History of smoking.  Counseled, he has refused nicotine patch.  4.  Underlying Parkinson's.  Continue carbidopa-levodopa.  PT OT consulted.  May require SNF if he agrees.  5.  History of COPD.  At baseline.  Supportive care   6.  CKD stage IIIb.  Baseline creatinine around 1.5.  At baseline continue to monitor.  7.  High D-dimer due to inflammation.  High risk for DVT, negative leg ultrasound, continue moderate dose Lovenox.   Placement and disposition has been challenging as patient is frequently changing his mind.  Social work looking for safe disposition.    Condition - Extremely Guarded  Family Communication  :  Steffanie Dunn [phone] on 03/04/2018 , Dr. Dorris Carnes POA on 03/04/2020.  Contacted brother on 03/07/2020.  Code Status :  DNR  Consults  :  None  Procedures  :    Leg ultrasound.  No DVT.  PUD Prophylaxis :   Disposition Plan  :    Status is: Inpatient  Remains inpatient  appropriate because:IV treatments appropriate due to intensity of illness or inability to take PO   Dispo: The patient is from: Home              Anticipated d/c is to: Home              Anticipated d/c date is: > 3 days              Patient currently is not medically stable to d/c.  DVT Prophylaxis  :  Lovenox    Lab Results  Component Value Date   PLT 106 (L) 03/06/2020    Diet :  Diet Order            Diet Carb Modified Fluid consistency: Thin; Room service appropriate? Yes  Diet effective now                  Inpatient Medications  Scheduled Meds: . aspirin EC  81 mg Oral Daily  . carbidopa-levodopa  1 tablet  Oral 3 times per day  . clopidogrel  75 mg Oral Daily  . enoxaparin (LOVENOX) injection  40 mg Subcutaneous Q12H  . Ipratropium-Albuterol  1 puff Inhalation Q6H  . lisinopril  10 mg Oral Daily  . rosuvastatin  20 mg Oral QHS   Continuous Infusions:  PRN Meds:.acetaminophen, chlorpheniramine-HYDROcodone, guaiFENesin-dextromethorphan, [DISCONTINUED] ondansetron **OR** ondansetron (ZOFRAN) IV  Antibiotics  :    Anti-infectives (From admission, onward)   Start     Dose/Rate Route Frequency Ordered Stop   03/04/20 1000  remdesivir 100 mg in sodium chloride 0.9 % 100 mL IVPB       "Followed by" Linked Group Details   100 mg 200 mL/hr over 30 Minutes Intravenous Daily 03/03/20 2110 03/07/20 1309   03/03/20 2115  remdesivir 200 mg in sodium chloride 0.9% 250 mL IVPB       "Followed by" Linked Group Details   200 mg 580 mL/hr over 30 Minutes Intravenous Once 03/03/20 2110 03/03/20 2334       Time Spent in minutes  30   Lala Lund M.D on 03/10/2020 at 9:37 AM  To page go to [email or web address]   [facility] -  Office  [phone]    See all Orders from today for further details    Objective:   Vitals:   03/09/20 0608 03/09/20 1211 03/09/20 2116 03/10/20 0544  BP: (!) 143/73 (!) 146/79 116/64 129/76  Pulse: (!) 45 (!) 50 (!) 50 (!) 48  Resp:  18 12 19 19   Temp: 98.7 F (37.1 C) 98.7 F (37.1 C) 98 F (36.7 C) 98.4 F (36.9 C)  TempSrc: Oral  Oral Oral  SpO2: 95% 95% 93% 95%    Wt Readings from Last 3 Encounters:  10/13/19 59.9 kg  06/09/19 64 kg  03/16/19 64 kg     Intake/Output Summary (Last 24 hours) at 03/10/2020 0937 Last data filed at 03/09/2020 1748 Gross per 24 hour  Intake --  Output 100 ml  Net -100 ml     Physical Exam  Awake Alert, No new F.N deficits, Normal affect [place].AT,PERRAL Supple Neck,No JVD, No cervical lymphadenopathy appriciated.  Symmetrical Chest wall movement, Good air movement bilaterally, CTAB RRR,No Gallops, Rubs or new Murmurs, No Parasternal Heave +ve B.Sounds, Abd Soft, No tenderness, No organomegaly appriciated, No rebound - guarding or rigidity. No Cyanosis, Clubbing or edema, No new Rash or bruise    Data Review:    CBC Recent Labs  Lab 03/03/20 1616 03/04/20 0319 03/05/20 0614 03/06/20 0448  WBC 10.3 8.0 7.9 6.6  HGB 14.2 13.4 13.5 12.9*  HCT 43.0 41.4 39.5 37.3*  PLT 96* 95* 98* 106*  MCV 93.5 93.2 91.4 89.9  MCH 30.9 30.2 31.3 31.1  MCHC 33.0 32.4 34.2 34.6  RDW 14.4 14.4 14.5 14.5  LYMPHSABS  --  0.2* 0.3* 0.2*  MONOABS  --  0.3 0.7 0.4  EOSABS  --  0.0 0.0 0.0  BASOSABS  --  0.0 0.0 0.0    Recent Labs  Lab 03/03/20 1616 03/03/20 2057 03/04/20 0319 03/05/20 0614 03/05/20 0615 03/06/20 0448 03/10/20 0318  NA 138  --  138 137  --  133*  --   K 4.0  --  4.1 4.3  --  4.4  --   CL 101  --  106 104  --  103  --   CO2 27  --  23 24  --  25  --   GLUCOSE 124*  --  153* 147*  --  156*  --   BUN 14  --  19 30*  --  33*  --   CREATININE 1.37*  --  1.37* 1.56*  --  1.26* 1.38*  CALCIUM 8.9  --  8.5* 8.4*  --  7.9*  --   AST  --   --  16 21  --  22  --   ALT  --   --  <5 8  --  <5  --   ALKPHOS  --   --  62 56  --  48  --   BILITOT  --   --  0.5 0.8  --  0.7  --   ALBUMIN  --   --  2.9* 2.8*  --  2.7*  --   MG  --   --  1.8 1.7  --  1.8  --   CRP  --   1.0* 2.2* 3.3*  --  4.4*  --   DDIMER  --   --  1.94* 1.93*  --  1.29*  --   PROCALCITON  --  <0.10  --   --   --   --   --   TSH  --   --  0.345*  --   --   --   --   BNP  --   --  163.6*  --  126.8* 136.9*  --     ------------------------------------------------------------------------------------------------------------------ No results for input(s): CHOL, HDL, LDLCALC, TRIG, CHOLHDL, LDLDIRECT in the last 72 hours.  Lab Results  Component Value Date   HGBA1C 5.8 (H) 01/26/2018   ------------------------------------------------------------------------------------------------------------------ No results for input(s): TSH, T4TOTAL, T3FREE, THYROIDAB in the last 72 hours.  Invalid input(s): FREET3  Cardiac Enzymes No results for input(s): CKMB, TROPONINI, MYOGLOBIN in the last 168 hours.  Invalid input(s): CK ------------------------------------------------------------------------------------------------------------------    Component Value Date/Time   BNP 136.9 (H) 03/06/2020 0448    Micro Results Recent Results (from the past 240 hour(s))  Resp Panel by RT-PCR (Flu A&B, Covid) Nasopharyngeal Swab     Status: Abnormal   Collection Time: 03/03/20  3:29 PM   Specimen: Nasopharyngeal Swab; Nasopharyngeal(NP) swabs in vial transport medium  Result Value Ref Range Status   SARS Coronavirus 2 by RT PCR POSITIVE (A) NEGATIVE Final    Comment: RESULT CALLED TO, READ BACK BY AND VERIFIED WITH: RN C STRAUGHAN AT 1713 03/03/2020 BY L BENFIELD (NOTE) SARS-CoV-2 target nucleic acids are DETECTED.  The SARS-CoV-2 RNA is generally detectable in upper respiratory specimens during the acute phase of infection. Positive results are indicative of the presence of the identified virus, but do not rule out bacterial infection or co-infection with other pathogens not detected by the test. Clinical correlation with patient history and other diagnostic information is necessary to determine  patient infection status. The expected result is Negative.  Fact Sheet for Patients: EntrepreneurPulse.com.au  Fact Sheet for Healthcare Providers: IncredibleEmployment.be  This test is not yet approved or cleared by the [place] FDA and  has been authorized for detection and/or diagnosis of SARS-CoV-2 by FDA under an Emergency Use Authorization (EUA).  This EUA will remain in effect (meaning this  test can be used) for the duration of  the COVID-19 declaration under Section 564(b)(1) of the Act, 21 U.S.C. section 360bbb-3(b)(1), unless the authorization is terminated or revoked sooner.     Influenza A by PCR NEGATIVE NEGATIVE Final   Influenza B by PCR NEGATIVE NEGATIVE Final    Comment: (NOTE) The Xpert  Xpress SARS-CoV-2/FLU/RSV plus assay is intended as an aid in the diagnosis of influenza from Nasopharyngeal swab specimens and should not be used as a sole basis for treatment. Nasal washings and aspirates are unacceptable for Xpert Xpress SARS-CoV-2/FLU/RSV testing.  Fact Sheet for Patients: EntrepreneurPulse.com.au  Fact Sheet for Healthcare Providers: IncredibleEmployment.be  This test is not yet approved or cleared by the [place] FDA and has been authorized for detection and/or diagnosis of SARS-CoV-2 by FDA under an Emergency Use Authorization (EUA). This EUA will remain in effect (meaning this test can be used) for the duration of the COVID-19 declaration under Section 564(b)(1) of the Act, 21 U.S.C. section 360bbb-3(b)(1), unless the authorization is terminated or revoked.  Performed at [facility], [place] [address] [place], [place] [address]     Radiology Reports DG Chest Port 1 View  Result Date: 03/03/2020 CLINICAL DATA:  Cough COVID EXAM: PORTABLE CHEST 1 VIEW COMPARISON:  08/20/2016 FINDINGS: Mild left retrocardiac opacity. No acute consolidation or pleural effusion.  Emphysematous disease. Stable cardiomediastinal silhouette. No pneumothorax. Probable skin fold artifact over the left lower lateral chest. IMPRESSION: Emphysematous disease. Patchy left retrocardiac opacity, atelectasis versus mild infiltrate. Electronically Signed   By: Donavan Foil M.D.   On: 03/03/2020 19:29   VAS [place] LOWER EXTREMITY VENOUS (DVT)  Result Date: 03/05/2020  Lower Venous DVT Study Other Indications: Covid, D-Dimer. Comparison Study: No previous Performing Technologist: Vonzell Schlatter RVT  Examination Guidelines: A complete evaluation includes B-mode imaging, spectral Doppler, color Doppler, and power Doppler as needed of all accessible portions of each vessel. Bilateral testing is considered an integral part of a complete examination. Limited examinations for reoccurring indications may be performed as noted. The reflux portion of the exam is performed with the patient in reverse Trendelenburg.  +---------+---------------+---------+-----------+----------+--------------+ RIGHT    CompressibilityPhasicitySpontaneityPropertiesThrombus Aging +---------+---------------+---------+-----------+----------+--------------+ CFV      Full           Yes      Yes                                 +---------+---------------+---------+-----------+----------+--------------+ SFJ      Full                                                        +---------+---------------+---------+-----------+----------+--------------+ FV Prox  Full                                                        +---------+---------------+---------+-----------+----------+--------------+ FV Mid   Full                                                        +---------+---------------+---------+-----------+----------+--------------+ FV DistalFull                                                        +---------+---------------+---------+-----------+----------+--------------+  PFV      Full                                                         +---------+---------------+---------+-----------+----------+--------------+ POP      Full           Yes      Yes                                 +---------+---------------+---------+-----------+----------+--------------+ PTV      Full                                                        +---------+---------------+---------+-----------+----------+--------------+ PERO     Full                                                        +---------+---------------+---------+-----------+----------+--------------+   +---------+---------------+---------+-----------+----------+--------------+ LEFT     CompressibilityPhasicitySpontaneityPropertiesThrombus Aging +---------+---------------+---------+-----------+----------+--------------+ CFV      Full           Yes      Yes                                 +---------+---------------+---------+-----------+----------+--------------+ SFJ      Full                                                        +---------+---------------+---------+-----------+----------+--------------+ FV Prox  Full                                                        +---------+---------------+---------+-----------+----------+--------------+ FV Mid   Full                                                        +---------+---------------+---------+-----------+----------+--------------+ FV DistalFull                                                        +---------+---------------+---------+-----------+----------+--------------+ PFV      Full                                                        +---------+---------------+---------+-----------+----------+--------------+  POP      Full           Yes      Yes                                 +---------+---------------+---------+-----------+----------+--------------+ PTV      Full                                                         +---------+---------------+---------+-----------+----------+--------------+ PERO     Full                                                        +---------+---------------+---------+-----------+----------+--------------+     Summary: RIGHT: - There is no evidence of deep vein thrombosis in the lower extremity.  - No cystic structure found in the popliteal fossa.  LEFT: - There is no evidence of deep vein thrombosis in the lower extremity.  - No cystic structure found in the popliteal fossa.  *See table(s) above for measurements and observations. Electronically signed by Ruta Hinds MD on 03/05/2020 at 12:12:59 PM.    Final

## 2020-03-11 DIAGNOSIS — U071 COVID-19: Secondary | ICD-10-CM | POA: Diagnosis not present

## 2020-03-11 LAB — GLUCOSE, CAPILLARY
Glucose-Capillary: 101 mg/dL — ABNORMAL HIGH (ref 70–99)
Glucose-Capillary: 149 mg/dL — ABNORMAL HIGH (ref 70–99)
Glucose-Capillary: 143 mg/dL — ABNORMAL HIGH (ref 70–99)
Glucose-Capillary: 103 mg/dL — ABNORMAL HIGH (ref 70–99)

## 2020-03-11 NOTE — Plan of Care (Signed)

## 2020-03-11 NOTE — Plan of Care (Signed)
  Problem: Education: Goal: Knowledge of risk factors and measures for prevention of condition will improve Outcome: Progressing   Problem: Coping: Goal: Psychosocial and spiritual needs will be supported Outcome: Progressing   Problem: Respiratory: Goal: Will maintain a patent airway Outcome: Progressing Goal: Complications related to the disease process, condition or treatment will be avoided or minimized Outcome: Progressing   Problem: Education: Goal: Knowledge of General Education information will improve Description: Including pain rating scale, medication(s)/side effects and non-pharmacologic comfort measures Outcome: Progressing   Problem: Health Behavior/Discharge Planning: Goal: Ability to manage health-related needs will improve Outcome: Progressing   Problem: Clinical Measurements: Goal: Ability to maintain clinical measurements within normal limits will improve Outcome: Progressing Goal: Will remain free from infection Outcome: Progressing Goal: Diagnostic test results will improve Outcome: Progressing Goal: Respiratory complications will improve Outcome: Progressing   Problem: Activity: Goal: Risk for activity intolerance will decrease Outcome: Progressing   Problem: Nutrition: Goal: Adequate nutrition will be maintained Outcome: Progressing   Problem: Coping: Goal: Level of anxiety will decrease Outcome: Progressing   Problem: Elimination: Goal: Will not experience complications related to bowel motility Outcome: Progressing   Problem: Pain Managment: Goal: General experience of comfort will improve Outcome: Progressing   Problem: Safety: Goal: Ability to remain free from injury will improve Outcome: Progressing   Problem: Skin Integrity: Goal: Risk for impaired skin integrity will decrease Outcome: Progressing   

## 2020-03-11 NOTE — Progress Notes (Signed)
PROGRESS NOTE                                                                                                                                                                                                             Patient Demographics:    Donald Richardson, is a 80 y.o. male, DOB - 1940/03/06, [id number]  Outpatient Primary MD for the patient is Denita Lung, MD    LOS - 8  Admit date - 03/03/2020    Chief Complaint  Patient presents with  . Weakness       Brief Narrative (HPI from H&P) -  Donald Richardson is a 80 y.o. male with hx of CVA, Parkinson's, bradycardia, tobacco abuse, cardiovascular disease, CKD three, non-insulin-dependent diabetes, COPD who presented to the hospital on 03/03/2020 after neighbors checked on him and noticed he was not himself and he was found to be Covid positive, in the ER he found to Covid PNA, sinus bradycardia, dehydration and he was admitted to the hospital.   Subjective:   Patient in bed, appears comfortable, denies any headache, no fever, no chest pain or pressure, no shortness of breath , no abdominal pain. No focal weakness.   Assessment  & Plan :    Is clinically stable for discharge we await placement.   1. Acute Hypoxic Resp. Failure due to Acute Covid 19 Viral Pneumonitis during the ongoing 2020 Covid 19 Pandemic - he has received 2 shots of mRNA vaccine with last dose in February 2021 however has not received his third booster shot, he seems to have incurred moderate parenchymal lung disease, has been placed on steroids and Remdesivir course, responding very well to it.  Remains stable, clinically better await placement.  Encouraged the patient to sit up in chair in the daytime use I-S and flutter valve for pulmonary toiletry and then prone in bed when at night.  Will advance activity and titrate down oxygen as possible.   Recent Labs  Lab 03/05/20 0614 03/05/20 0615  03/06/20 0448  WBC 7.9  --  6.6  HGB 13.5  --  12.9*  HCT 39.5  --  37.3*  PLT 98*  --  106*  CRP 3.3*  --  4.4*  BNP  --  126.8* 136.9*  DDIMER 1.93*  --  1.29*  AST 21  --  22  ALT 8  --  <5  ALKPHOS 56  --  48  BILITOT 0.8  --  0.7  ALBUMIN 2.8*  --  2.7*    Lab Results  Component Value Date   TSH 0.345 (L) 03/04/2020    2. 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  Also discussed with his POA Dr. Huston FoleyPaul Ross cardiologist, his TSH if anything is mildly suppressed suggesting sick euthyroid.  3.  History of smoking.  Counseled, he has refused nicotine patch.  4.  Underlying Parkinson's.  Continue carbidopa-levodopa.  PT OT consulted.  May require SNF if he agrees.  5.  History of COPD.  At baseline.  Supportive care   6.  CKD stage IIIb.  Baseline creatinine around 1.5.  At baseline continue to monitor.  7.  High D-dimer due to inflammation.  High risk for DVT, negative leg ultrasound, continue moderate dose Lovenox.   Placement and disposition has been challenging as patient is frequently changing his mind.  Social work looking for safe disposition.    Condition - Extremely Guarded  Family Communication  :  Bufford LopeBrother [name] [phone] on 03/04/2018 , Dr. Dietrich PatesPaula Ross POA on 03/04/2020.  Contacted brother on 03/07/2020.  Code Status :  DNR  Consults  :  None  Procedures  :    Leg ultrasound.  No DVT.  PUD Prophylaxis :   Disposition Plan  :    Status is: Inpatient  Remains inpatient appropriate because:IV treatments appropriate due to intensity of illness or inability to take PO   Dispo: The patient is from: Home              Anticipated d/c is to: Home              Anticipated d/c date is: > 3 days              Patient currently is not medically stable to d/c.  DVT Prophylaxis  :  Lovenox    Lab Results   Component Value Date   PLT 106 (L) 03/06/2020    Diet :  Diet Order            Diet Carb Modified Fluid consistency: Thin; Room service appropriate? Yes  Diet effective now                  Inpatient Medications  Scheduled Meds: . aspirin EC  81 mg Oral Daily  . carbidopa-levodopa  1 tablet Oral 3 times per day  . clopidogrel  75 mg Oral Daily  . enoxaparin (LOVENOX) injection  40 mg Subcutaneous Q12H  . Ipratropium-Albuterol  1 puff Inhalation Q6H  . lisinopril  10 mg Oral Daily  . rosuvastatin  20 mg Oral QHS   Continuous Infusions:  PRN Meds:.acetaminophen, chlorpheniramine-HYDROcodone, guaiFENesin-dextromethorphan, [DISCONTINUED] ondansetron **OR** ondansetron (ZOFRAN) IV  Antibiotics  :    Anti-infectives (From admission, onward)   Start     Dose/Rate Route Frequency Ordered Stop   03/04/20 1000  remdesivir 100 mg in sodium chloride 0.9 % 100 mL IVPB       "Followed by" Linked Group Details   100 mg 200 mL/hr over 30 Minutes Intravenous Daily 03/03/20 2110 03/07/20 1309   03/03/20 2115  remdesivir 200 mg in sodium chloride 0.9% 250 mL IVPB       "Followed by"  Linked Group Details   200 mg 580 mL/hr over 30 Minutes Intravenous Once 03/03/20 2110 03/03/20 2334       Time Spent in minutes  30   Lala Lund M.D on 03/11/2020 at 10:19 AM  To page go to [email or web address]   [facility] -  Office  [phone]    See all Orders from today for further details    Objective:   Vitals:   03/10/20 0544 03/10/20 1225 03/10/20 2050 03/11/20 0527  BP: 129/76 131/72 132/73 126/81  Pulse: (!) 48 (!) 55 (!) 58 68  Resp: 19 18 17 18   Temp: 98.4 F (36.9 C) 99 F (37.2 C) 98.9 F (37.2 C) 98.7 F (37.1 C)  TempSrc: Oral  Oral Oral  SpO2: 95% 95% 100% 100%    Wt Readings from Last 3 Encounters:  10/13/19 59.9 kg  06/09/19 64 kg  03/16/19 64 kg     Intake/Output Summary (Last 24 hours) at 03/11/2020 1019 Last data filed at 03/11/2020 0500 Gross per 24  hour  Intake --  Output 700 ml  Net -700 ml     Physical Exam  Awake Alert, No new F.N deficits, Normal affect .AT,PERRAL Supple Neck,No JVD, No cervical lymphadenopathy appriciated.  Symmetrical Chest wall movement, Good air movement bilaterally, CTAB RRR,No Gallops, Rubs or new Murmurs, No Parasternal Heave +ve B.Sounds, Abd Soft, No tenderness, No organomegaly appriciated, No rebound - guarding or rigidity. No Cyanosis, Clubbing or edema, No new Rash or bruise    Data Review:    CBC Recent Labs  Lab 03/05/20 0614 03/06/20 0448  WBC 7.9 6.6  HGB 13.5 12.9*  HCT 39.5 37.3*  PLT 98* 106*  MCV 91.4 89.9  MCH 31.3 31.1  MCHC 34.2 34.6  RDW 14.5 14.5  LYMPHSABS 0.3* 0.2*  MONOABS 0.7 0.4  EOSABS 0.0 0.0  BASOSABS 0.0 0.0    Recent Labs  Lab 03/05/20 0614 03/05/20 0615 03/06/20 0448 03/10/20 0318  NA 137  --  133*  --   K 4.3  --  4.4  --   CL 104  --  103  --   CO2 24  --  25  --   GLUCOSE 147*  --  156*  --   BUN 30*  --  33*  --   CREATININE 1.56*  --  1.26* 1.38*  CALCIUM 8.4*  --  7.9*  --   AST 21  --  22  --   ALT 8  --  <5  --   ALKPHOS 56  --  48  --   BILITOT 0.8  --  0.7  --   ALBUMIN 2.8*  --  2.7*  --   MG 1.7  --  1.8  --   CRP 3.3*  --  4.4*  --   DDIMER 1.93*  --  1.29*  --   BNP  --  126.8* 136.9*  --     ------------------------------------------------------------------------------------------------------------------ No results for input(s): CHOL, HDL, LDLCALC, TRIG, CHOLHDL, LDLDIRECT in the last 72 hours.  Lab Results  Component Value Date   HGBA1C 5.8 (H) 01/26/2018   ------------------------------------------------------------------------------------------------------------------ No results for input(s): TSH, T4TOTAL, T3FREE, THYROIDAB in the last 72 hours.  Invalid input(s): FREET3  Cardiac Enzymes No results for input(s): CKMB, TROPONINI, MYOGLOBIN in the last 168 hours.  Invalid input(s):  CK ------------------------------------------------------------------------------------------------------------------    Component Value Date/Time   BNP 136.9 (H) 03/06/2020 0448    Micro Results Recent Results (from the past  240 hour(s))  Resp Panel by RT-PCR (Flu A&B, Covid) Nasopharyngeal Swab     Status: Abnormal   Collection Time: 03/03/20  3:29 PM   Specimen: Nasopharyngeal Swab; Nasopharyngeal(NP) swabs in vial transport medium  Result Value Ref Range Status   SARS Coronavirus 2 by RT PCR POSITIVE (A) NEGATIVE Final    Comment: RESULT CALLED TO, READ BACK BY AND VERIFIED WITH: RN C STRAUGHAN AT 1713 03/03/2020 BY L BENFIELD (NOTE) SARS-CoV-2 target nucleic acids are DETECTED.  The SARS-CoV-2 RNA is generally detectable in upper respiratory specimens during the acute phase of infection. Positive results are indicative of the presence of the identified virus, but do not rule out bacterial infection or co-infection with other pathogens not detected by the test. Clinical correlation with patient history and other diagnostic information is necessary to determine patient infection status. The expected result is Negative.  Fact Sheet for Patients: EntrepreneurPulse.com.au  Fact Sheet for Healthcare Providers: IncredibleEmployment.be  This test is not yet approved or cleared by the [place] FDA and  has been authorized for detection and/or diagnosis of SARS-CoV-2 by FDA under an Emergency Use Authorization (EUA).  This EUA will remain in effect (meaning this  test can be used) for the duration of  the COVID-19 declaration under Section 564(b)(1) of the Act, 21 U.S.C. section 360bbb-3(b)(1), unless the authorization is terminated or revoked sooner.     Influenza A by PCR NEGATIVE NEGATIVE Final   Influenza B by PCR NEGATIVE NEGATIVE Final    Comment: (NOTE) The Xpert Xpress SARS-CoV-2/FLU/RSV plus assay is intended as an aid in the  diagnosis of influenza from Nasopharyngeal swab specimens and should not be used as a sole basis for treatment. Nasal washings and aspirates are unacceptable for Xpert Xpress SARS-CoV-2/FLU/RSV testing.  Fact Sheet for Patients: EntrepreneurPulse.com.au  Fact Sheet for Healthcare Providers: IncredibleEmployment.be  This test is not yet approved or cleared by the [place] FDA and has been authorized for detection and/or diagnosis of SARS-CoV-2 by FDA under an Emergency Use Authorization (EUA). This EUA will remain in effect (meaning this test can be used) for the duration of the COVID-19 declaration under Section 564(b)(1) of the Act, 21 U.S.C. section 360bbb-3(b)(1), unless the authorization is terminated or revoked.  Performed at [facility], [place] [address] [place], [place] [address]     Radiology Reports DG Chest Port 1 View  Result Date: 03/03/2020 CLINICAL DATA:  Cough COVID EXAM: PORTABLE CHEST 1 VIEW COMPARISON:  08/20/2016 FINDINGS: Mild left retrocardiac opacity. No acute consolidation or pleural effusion. Emphysematous disease. Stable cardiomediastinal silhouette. No pneumothorax. Probable skin fold artifact over the left lower lateral chest. IMPRESSION: Emphysematous disease. Patchy left retrocardiac opacity, atelectasis versus mild infiltrate. Electronically Signed   By: Donavan Foil M.D.   On: 03/03/2020 19:29   VAS [place] LOWER EXTREMITY VENOUS (DVT)  Result Date: 03/05/2020  Lower Venous DVT Study Other Indications: Covid, D-Dimer. Comparison Study: No previous Performing Technologist: Vonzell Schlatter RVT  Examination Guidelines: A complete evaluation includes B-mode imaging, spectral Doppler, color Doppler, and power Doppler as needed of all accessible portions of each vessel. Bilateral testing is considered an integral part of a complete examination. Limited examinations for reoccurring indications may be performed as noted. The  reflux portion of the exam is performed with the patient in reverse Trendelenburg.  +---------+---------------+---------+-----------+----------+--------------+ RIGHT    CompressibilityPhasicitySpontaneityPropertiesThrombus Aging +---------+---------------+---------+-----------+----------+--------------+ CFV      Full           Yes  Yes                                 +---------+---------------+---------+-----------+----------+--------------+ SFJ      Full                                                        +---------+---------------+---------+-----------+----------+--------------+ FV Prox  Full                                                        +---------+---------------+---------+-----------+----------+--------------+ FV Mid   Full                                                        +---------+---------------+---------+-----------+----------+--------------+ FV DistalFull                                                        +---------+---------------+---------+-----------+----------+--------------+ PFV      Full                                                        +---------+---------------+---------+-----------+----------+--------------+ POP      Full           Yes      Yes                                 +---------+---------------+---------+-----------+----------+--------------+ PTV      Full                                                        +---------+---------------+---------+-----------+----------+--------------+ PERO     Full                                                        +---------+---------------+---------+-----------+----------+--------------+   +---------+---------------+---------+-----------+----------+--------------+ LEFT     CompressibilityPhasicitySpontaneityPropertiesThrombus Aging +---------+---------------+---------+-----------+----------+--------------+ CFV      Full           Yes       Yes                                 +---------+---------------+---------+-----------+----------+--------------+ SFJ      Full                                                        +---------+---------------+---------+-----------+----------+--------------+  FV Prox  Full                                                        +---------+---------------+---------+-----------+----------+--------------+ FV Mid   Full                                                        +---------+---------------+---------+-----------+----------+--------------+ FV DistalFull                                                        +---------+---------------+---------+-----------+----------+--------------+ PFV      Full                                                        +---------+---------------+---------+-----------+----------+--------------+ POP      Full           Yes      Yes                                 +---------+---------------+---------+-----------+----------+--------------+ PTV      Full                                                        +---------+---------------+---------+-----------+----------+--------------+ PERO     Full                                                        +---------+---------------+---------+-----------+----------+--------------+     Summary: RIGHT: - There is no evidence of deep vein thrombosis in the lower extremity.  - No cystic structure found in the popliteal fossa.  LEFT: - There is no evidence of deep vein thrombosis in the lower extremity.  - No cystic structure found in the popliteal fossa.  *See table(s) above for measurements and observations. Electronically signed by Ruta Hinds MD on 03/05/2020 at 12:12:59 PM.    Final

## 2020-03-12 DIAGNOSIS — U071 COVID-19: Secondary | ICD-10-CM | POA: Diagnosis not present

## 2020-03-12 NOTE — Progress Notes (Signed)
Physical Therapy Treatment Patient Details Name: Donald Richardson MRN: [id number] DOB: 1940/08/25 Today's Date: 03/12/2020    History of Present Illness 80 y.o. male with hx of CVA, Parkinson's, bradycardia, tobacco abuse, cardiovascular disease, CKD three, non-insulin-dependent diabetes, COPD who presented to the hospital on 03/03/2020 after neighbors checked on him and noticed he was not himself, weak and cognitively altered, he was found to be Covid positive.    PT Comments    Pt HOH and able to transition to sitting, walking and OOB to recliner. Pt with decreased safety awareness, balance and gait who lives alone and reports at baseline he is normally able to walk much more. Pt required encouragement to sit in chair after gait and not return to bed. Pt with limited reception to appropriate transfers and RW use. Will continue to follow with D/C plan still appropriate.      Follow Up Recommendations  SNF;Supervision for mobility/OOB     Equipment Recommendations  None recommended by PT    Recommendations for Other Services       Precautions / Restrictions Precautions Precautions: Fall    Mobility  Bed Mobility Overal bed mobility: Needs Assistance Bed Mobility: Supine to Sit     Supine to sit: Min assist;HOB elevated     General bed mobility comments: HOB 20 degrees with HHA to elevate trunk from surface  Transfers Overall transfer level: Needs assistance   Transfers: Sit to/from Stand Sit to Stand: Min guard         General transfer comment: cues for hand placement and safety with pt having tendency to maintain hands on RW throughout transfers  Ambulation/Gait Ambulation/Gait assistance: Min guard Gait Distance (Feet): 180 Feet Assistive device: Rolling walker (2 wheeled) Gait Pattern/deviations: Step-through pattern;Shuffle;Trunk flexed   Gait velocity interpretation: 1.31 - 2.62 ft/sec, indicative of limited community ambulator General Gait Details: decreased  foot clearance with RW too anterior and pt not receptive to education for proper positioning or use, hitting obstacles x 2 with direction for room number   Stairs             Wheelchair Mobility    Modified Rankin (Stroke Patients Only)       Balance Overall balance assessment: Needs assistance   Sitting balance-Leahy Scale: Fair     Standing balance support: Bilateral upper extremity supported;Single extremity supported Standing balance-Leahy Scale: Poor Standing balance comment: at least single UE support in standing, RW for gait                            Cognition Arousal/Alertness: Awake/alert Behavior During Therapy: WFL for tasks assessed/performed Overall Cognitive Status: Impaired/Different from baseline Area of Impairment: Safety/judgement                         Safety/Judgement: Decreased awareness of safety;Decreased awareness of deficits     General Comments: HOH, agitated with repeated cues to step into RW and for hand placement with transfers without reception to safety implication of current habits      Exercises General Exercises - Lower Extremity Long Arc Quad: AROM;Both;Seated;15 reps Hip Flexion/Marching: AROM;Both;Seated;10 reps    General Comments        Pertinent Vitals/Pain Pain Assessment: No/denies pain    Home Living                      Prior Function  PT Goals (current goals can now be found in the care plan section) Progress towards PT goals: Progressing toward goals    Frequency    Min 3X/week      PT Plan Current plan remains appropriate    Co-evaluation              AM-PAC PT "6 Clicks" Mobility   Outcome Measure  Help needed turning from your back to your side while in a flat bed without using bedrails?: A Little Help needed moving from lying on your back to sitting on the side of a flat bed without using bedrails?: A Little Help needed moving to and from a  bed to a chair (including a wheelchair)?: A Little Help needed standing up from a chair using your arms (e.g., wheelchair or bedside chair)?: A Little Help needed to walk in hospital room?: A Little Help needed climbing 3-5 steps with a railing? : A Lot 6 Click Score: 17    End of Session Equipment Utilized During Treatment: Gait belt Activity Tolerance: Patient tolerated treatment well Patient left: in chair;with call bell/phone within reach;with chair alarm set Nurse Communication: Mobility status PT Visit Diagnosis: Other abnormalities of gait and mobility (R26.89);Muscle weakness (generalized) (M62.81)     Time: 4580-9983 PT Time Calculation (min) (ACUTE ONLY): 23 min  Charges:  $Gait Training: 8-22 mins $Therapeutic Exercise: 8-22 mins                     Nishika Parkhurst P, PT Acute Rehabilitation Services Pager: [phone] Office: [place] 03/12/2020, 1:13 PM

## 2020-03-12 NOTE — TOC Progression Note (Addendum)
Transition of Care [facility]) - Progression Note    Patient Details  Name: Donald Richardson MRN: [id number] Date of Birth: May 25, 1940  [facility] CM/SW [place], RN Phone Number: [phone]  03/12/2020, 10:25 AM  Clinical Narrative:    CM received message from patients brother Jenson Beedle. Mr. Bain is concerned because he has not heard anything from CM or the MD. CM explained to Mr. Desroches that CM has been off and CM explained that beds for covid patients are limited and we are currently awaiting a bed offer. Cm reviewed bed offers in [place] and made brother aware that [facility] has made a bed offer. CM made brother aware that once bed offer is definite that CM would update brother and handle the discharge and transport process.   [employer] called Juliann Pulse with [facility] to inquire if the facility has bed available for patient. Juliann Pulse reports that there are currently no COVID  beds available and she would remove the approval from he HUB. TOC will continue to follow and seek bed placement. This is a process due to limited covid bed availability.   [facility] Khalik [place] has been updated and understands that [facility] does not currently have a bed available for the patient.  1430 CM called [facility] to determine if the facility has any COVID beds available. Per Perrin Smack there are no COVID beds currently available. Perrin Smack states that CM can call back next week and she may have beds available but the facility is not expecting to have any beds this week. [facility] which is the only other known facility accepting Covid positive patients has denied patient due to no bed availability. TOC will continue to follow.    Expected Discharge Plan: [place] Barriers to Discharge: Continued Medical Work up  Expected Discharge Plan and Services Expected Discharge Plan: [place] In-house Referral: NA Discharge Planning Services: CM  Consult Post Acute Care Choice: [place] arrangements for the past 2 months: Single Family Home                 DME Arranged: N/A DME Agency: NA       HH Arranged: RN,Nurse's Aide,PT,Social Work [employer] Agency: [facility] [facility] [employer] Date [place]: 03/07/20 Time [place]: 1010 Representative spoke with at [place]: [place] ([place]) Interventions    Readmission Risk Interventions No flowsheet data found.

## 2020-03-12 NOTE — Progress Notes (Signed)
Occupational Therapy Treatment Patient Details Name: Donald Richardson MRN: [id number] DOB: 06/22/1940 Today's Date: 03/12/2020    History of present illness 80 y.o. male with hx of CVA, Parkinson's, bradycardia, tobacco abuse, cardiovascular disease, CKD three, non-insulin-dependent diabetes, COPD who presented to the hospital on 03/03/2020 after neighbors checked on him and noticed he was not himself, weak and cognitively altered, he was found to be Covid positive.   OT comments  Pt attempting to avoid obstacles, but pt continued to require cues to walk into RW instead of pushing it out in front. Pt limited by ability to show OTR ADL tasks due to unwillingness to participate other than wanting to ambulate.O2 >90% on RA. Pt ambulating ~100' with RW in hallway and in room. Pt minguardA for mobility and transfers. Pt continues to require increased assist for ADL to fatigue. Pt would benefit from continued OT skilled services. OT following acutely. ** pt could go home with 24/7 assist    Follow Up Recommendations  Supervision/Assistance - 24 hour;SNF;Home health OT    Equipment Recommendations  3 in 1 bedside commode    Recommendations for Other Services      Precautions / Restrictions Precautions Precautions: Fall Restrictions Weight Bearing Restrictions: No       Mobility Bed Mobility Overal bed mobility: Needs Assistance Bed Mobility: Sit to Supine       Sit to supine: Mod assist   General bed mobility comments: Assist to place LB onto bed  Transfers Overall transfer level: Needs assistance   Transfers: Sit to/from Stand Sit to Stand: Min guard         General transfer comment: cues for hand placement from chair instead of pulling on RW    Balance Overall balance assessment: Needs assistance   Sitting balance-Leahy Scale: Fair     Standing balance support: Bilateral upper extremity supported Standing balance-Leahy Scale: Poor Standing balance comment: Pt stopped  often to look at items in hallway                           ADL either performed or assessed with clinical judgement   ADL Overall ADL's : Needs assistance/impaired Eating/Feeding: Set up;Sitting                       Toilet Transfer: Min Fish farm manager Details (indicate cue type and reason): simulated to recliner; pt seated for urinal task and he was no successful using it in sitting; linens changed.         Functional mobility during ADLs: Rolling walker;Cueing for safety;Min guard General ADL Comments: Pt attempting to avoid obstacles, but pt continued to require cues to walk into RW instead of pushing it out in front. Pt limited by ability to show OTR ADL tasks due to unwillingness to participate other than wanting to ambulate.     Vision   Vision Assessment?: No apparent visual deficits   Perception     Praxis      Cognition Arousal/Alertness: Awake/alert Behavior During Therapy: WFL for tasks assessed/performed Overall Cognitive Status: Impaired/Different from baseline Area of Impairment: Safety/judgement                         Safety/Judgement: Decreased awareness of safety;Decreased awareness of deficits     General Comments: Pt requires cues to step into RW and reduce feet shuffling when possible. Pt agitated at times and unwilling to wash  hands at sink after using urinal; rather pt wanting to use wash cloth in recliner.        Exercises Other Exercises Other Exercises: education for use of IS, but pt stating "I'm too tired."   Shoulder Instructions       General Comments O2 >90% on RA.    Pertinent Vitals/ Pain       Pain Assessment: No/denies pain  Home Living                                          Prior Functioning/Environment              Frequency  Min 2X/week        Progress Toward Goals  OT Goals(current goals can now be found in the care plan section)  Progress towards  OT goals: Progressing toward goals  Acute Rehab OT Goals Patient Stated Goal: to go home OT Goal Formulation: With patient Time For Goal Achievement: 03/22/20 Potential to Achieve Goals: Good ADL Goals Pt Will Perform Grooming: with modified independence;standing Pt Will Perform Lower Body Dressing: with supervision;sit to/from stand Pt Will Transfer to Toilet: with modified independence;ambulating Additional ADL Goal #1: Pt will demonstrate independence with 3 energy conservation strategies during ADL completion. Additional ADL Goal #2: Pt will demonstrate independence with 3 fall prevention strategies.  Plan Discharge plan remains appropriate    Co-evaluation                 AM-PAC OT "6 Clicks" Daily Activity     Outcome Measure   Help from another person eating meals?: None Help from another person taking care of personal grooming?: A Little Help from another person toileting, which includes using toliet, bedpan, or urinal?: A Little Help from another person bathing (including washing, rinsing, drying)?: A Little Help from another person to put on and taking off regular upper body clothing?: A Little Help from another person to put on and taking off regular lower body clothing?: A Little 6 Click Score: 19    End of Session Equipment Utilized During Treatment: Rolling walker  OT Visit Diagnosis: Unsteadiness on feet (R26.81);Muscle weakness (generalized) (M62.81);Other symptoms and signs involving cognitive function   Activity Tolerance Patient tolerated treatment well   Patient Left in bed;with bed alarm set;with call bell/phone within reach   Nurse Communication Mobility status        Time: 1357-1430 OT Time Calculation (min): 33 min  Charges: OT General Charges $OT Visit: 1 Visit OT Treatments $Self Care/Home Management : 8-22 mins $Therapeutic Activity: 8-22 mins  Jefferey Pica, OTR/L Acute Rehabilitation Services Pager: [phone] Office:  [phone]    Amaiyah Nordhoff  C 03/12/2020, 5:44 PM

## 2020-03-12 NOTE — Progress Notes (Signed)
PROGRESS NOTE                                                                                                                                                                                                             Patient Demographics:    Donald Richardson, is a 80 y.o. male, DOB - 06-May-1940, PXT:[id number]  Outpatient Primary MD for the patient is Denita Lung, MD    LOS - 9  Admit date - 03/03/2020    Chief Complaint  Patient presents with  . Weakness       Brief Narrative (HPI from H&P) -  Donald Richardson is a 80 y.o. male with hx of CVA, Parkinson's, bradycardia, tobacco abuse, cardiovascular disease, CKD three, non-insulin-dependent diabetes, COPD who presented to the hospital on 03/03/2020 after neighbors checked on him and noticed he was not himself and he was found to be Covid positive, in the ER he found to Covid PNA, sinus bradycardia, dehydration and he was admitted to the hospital.   Subjective:   Patient in bed, appears comfortable, denies any headache, no fever, no chest pain or pressure, no shortness of breath , no abdominal pain. No focal weakness.   Assessment  & Plan :    Is clinically stable for discharge we await placement.   1. Acute Hypoxic Resp. Failure due to Acute Covid 19 Viral Pneumonitis during the ongoing 2020 Covid 19 Pandemic - he has received 2 shots of mRNA vaccine with last dose in February 2021 however has not received his third booster shot, he seems to have incurred moderate parenchymal lung disease, has been placed on steroids and Remdesivir course, responding very well to it.  Remains stable, clinically better await placement.  Encouraged the patient to sit up in chair in the daytime use I-S and flutter valve for pulmonary toiletry and then prone in bed when at night.  Will advance activity and titrate down oxygen as possible.   Recent Labs  Lab 03/06/20 0448  WBC 6.6  HGB 12.9*   HCT 37.3*  PLT 106*  CRP 4.4*  BNP 136.9*  DDIMER 1.29*  AST 22  ALT <5  ALKPHOS 48  BILITOT 0.7  ALBUMIN 2.7*    Lab Results  Component Value Date   TSH 0.345 (L) 03/04/2020    2. HX of sinus bradycardia at  baseline, per brother and patient baseline and low 40s when he is awake, he is not a candidate for pacemaker, discussed with patient's brother as well, medical treatment only.  Also discussed with his POA Donald Richardson cardiologist, his TSH if anything is mildly suppressed suggesting sick euthyroid.  3.  History of smoking.  Counseled, he has refused nicotine patch.  4.  Underlying Parkinson's.  Continue carbidopa-levodopa.  PT OT consulted.  May require SNF if he agrees.  5.  History of COPD.  At baseline.  Supportive care   6.  CKD stage IIIb.  Baseline creatinine around 1.5.  At baseline continue to monitor.  7.  High D-dimer due to inflammation.  High risk for DVT, negative leg ultrasound, continue moderate dose Lovenox.   Placement and disposition has been challenging as patient is frequently changing his mind.  Social work looking for safe disposition.    Condition - Extremely Guarded  Family Communication  :  Donald Richardson [phone] on 03/04/2018 , Dr. Dorris Carnes POA on 03/04/2020.  Contacted brother on 03/07/2020.  Code Status :  DNR  Consults  :  None  Procedures  :    Leg ultrasound.  No DVT.  PUD Prophylaxis :   Disposition Plan  :    Status is: Inpatient  Remains inpatient appropriate because:IV treatments appropriate due to intensity of illness or inability to take PO   Dispo: The patient is from: Home              Anticipated d/c is to: Home              Anticipated d/c date is: > 3 days              Patient currently is not medically stable to d/c.  DVT Prophylaxis  :  Lovenox    Lab Results  Component Value Date   PLT 106 (L) 03/06/2020    Diet :  Diet Order            Diet Carb Modified Fluid consistency: Thin; Room service  appropriate? Yes  Diet effective now                  Inpatient Medications  Scheduled Meds: . aspirin EC  81 mg Oral Daily  . carbidopa-levodopa  1 tablet Oral 3 times per day  . clopidogrel  75 mg Oral Daily  . enoxaparin (LOVENOX) injection  40 mg Subcutaneous Q12H  . Ipratropium-Albuterol  1 puff Inhalation Q6H  . lisinopril  10 mg Oral Daily  . rosuvastatin  20 mg Oral QHS   Continuous Infusions:  PRN Meds:.acetaminophen, chlorpheniramine-HYDROcodone, guaiFENesin-dextromethorphan, [DISCONTINUED] ondansetron **OR** ondansetron (ZOFRAN) IV  Antibiotics  :    Anti-infectives (From admission, onward)   Start     Dose/Rate Route Frequency Ordered Stop   03/04/20 1000  remdesivir 100 mg in sodium chloride 0.9 % 100 mL IVPB       "Followed by" Linked Group Details   100 mg 200 mL/hr over 30 Minutes Intravenous Daily 03/03/20 2110 03/07/20 1309   03/03/20 2115  remdesivir 200 mg in sodium chloride 0.9% 250 mL IVPB       "Followed by" Linked Group Details   200 mg 580 mL/hr over 30 Minutes Intravenous Once 03/03/20 2110 03/03/20 2334       Time Spent in minutes  30   Lala Lund M.D on 03/12/2020 at 10:00 AM  To page go to [email or web address]   [facility] -  Office  [phone]    See all Orders from today for further details    Objective:   Vitals:   03/11/20 0527 03/11/20 1226 03/11/20 2056 03/12/20 0531  BP: 126/81 132/77 138/74 132/68  Pulse: 68 (!) 55 68 65  Resp: 18 20 18 19   Temp: 98.7 F (37.1 C) 99.6 F (37.6 C) 98.6 F (37 C) 98.2 F (36.8 C)  TempSrc: Oral  Oral Oral  SpO2: 100% 95% 96% 95%    Wt Readings from Last 3 Encounters:  10/13/19 59.9 kg  06/09/19 64 kg  03/16/19 64 kg     Intake/Output Summary (Last 24 hours) at 03/12/2020 1000 Last data filed at 03/11/2020 2130 Gross per 24 hour  Intake --  Output 675 ml  Net -675 ml     Physical Exam  Awake Alert, No new F.N deficits, Normal affect [place].AT,PERRAL Supple Neck,No  JVD, No cervical lymphadenopathy appriciated.  Symmetrical Chest wall movement, Good air movement bilaterally, CTAB RRR,No Gallops, Rubs or new Murmurs, No Parasternal Heave +ve B.Sounds, Abd Soft, No tenderness, No organomegaly appriciated, No rebound - guarding or rigidity. No Cyanosis, Clubbing or edema, No new Rash or bruise    Data Review:    CBC Recent Labs  Lab 03/06/20 0448  WBC 6.6  HGB 12.9*  HCT 37.3*  PLT 106*  MCV 89.9  MCH 31.1  MCHC 34.6  RDW 14.5  LYMPHSABS 0.2*  MONOABS 0.4  EOSABS 0.0  BASOSABS 0.0    Recent Labs  Lab 03/06/20 0448 03/10/20 0318  NA 133*  --   K 4.4  --   CL 103  --   CO2 25  --   GLUCOSE 156*  --   BUN 33*  --   CREATININE 1.26* 1.38*  CALCIUM 7.9*  --   AST 22  --   ALT <5  --   ALKPHOS 48  --   BILITOT 0.7  --   ALBUMIN 2.7*  --   MG 1.8  --   CRP 4.4*  --   DDIMER 1.29*  --   BNP 136.9*  --     ------------------------------------------------------------------------------------------------------------------ No results for input(s): CHOL, HDL, LDLCALC, TRIG, CHOLHDL, LDLDIRECT in the last 72 hours.  Lab Results  Component Value Date   HGBA1C 5.8 (H) 01/26/2018   ------------------------------------------------------------------------------------------------------------------ No results for input(s): TSH, T4TOTAL, T3FREE, THYROIDAB in the last 72 hours.  Invalid input(s): FREET3  Cardiac Enzymes No results for input(s): CKMB, TROPONINI, MYOGLOBIN in the last 168 hours.  Invalid input(s): CK ------------------------------------------------------------------------------------------------------------------    Component Value Date/Time   BNP 136.9 (H) 03/06/2020 0448    Micro Results Recent Results (from the past 240 hour(s))  Resp Panel by RT-PCR (Flu A&B, Covid) Nasopharyngeal Swab     Status: Abnormal   Collection Time: 03/03/20  3:29 PM   Specimen: Nasopharyngeal Swab; Nasopharyngeal(NP) swabs in vial  transport medium  Result Value Ref Range Status   SARS Coronavirus 2 by RT PCR POSITIVE (A) NEGATIVE Final    Comment: RESULT CALLED TO, READ BACK BY AND VERIFIED WITH: RN C STRAUGHAN AT 1713 03/03/2020 BY L BENFIELD (NOTE) SARS-CoV-2 target nucleic acids are DETECTED.  The SARS-CoV-2 RNA is generally detectable in upper respiratory specimens during the acute phase of infection. Positive results are indicative of the presence of the identified virus, but do not rule out bacterial infection or co-infection with other pathogens not detected by the test. Clinical correlation with patient history and other diagnostic information is necessary  to determine patient infection status. The expected result is Negative.  Fact Sheet for Patients: EntrepreneurPulse.com.au  Fact Sheet for Healthcare Providers: IncredibleEmployment.be  This test is not yet approved or cleared by the [place] FDA and  has been authorized for detection and/or diagnosis of SARS-CoV-2 by FDA under an Emergency Use Authorization (EUA).  This EUA will remain in effect (meaning this  test can be used) for the duration of  the COVID-19 declaration under Section 564(b)(1) of the Act, 21 U.S.C. section 360bbb-3(b)(1), unless the authorization is terminated or revoked sooner.     Influenza A by PCR NEGATIVE NEGATIVE Final   Influenza B by PCR NEGATIVE NEGATIVE Final    Comment: (NOTE) The Xpert Xpress SARS-CoV-2/FLU/RSV plus assay is intended as an aid in the diagnosis of influenza from Nasopharyngeal swab specimens and should not be used as a sole basis for treatment. Nasal washings and aspirates are unacceptable for Xpert Xpress SARS-CoV-2/FLU/RSV testing.  Fact Sheet for Patients: EntrepreneurPulse.com.au  Fact Sheet for Healthcare Providers: IncredibleEmployment.be  This test is not yet approved or cleared by the [place] FDA  and has been authorized for detection and/or diagnosis of SARS-CoV-2 by FDA under an Emergency Use Authorization (EUA). This EUA will remain in effect (meaning this test can be used) for the duration of the COVID-19 declaration under Section 564(b)(1) of the Act, 21 U.S.C. section 360bbb-3(b)(1), unless the authorization is terminated or revoked.  Performed at [facility], [place] [address], [place], [place] [address]     Radiology Reports DG Chest Port 1 View  Result Date: 03/03/2020 CLINICAL DATA:  Cough COVID EXAM: PORTABLE CHEST 1 VIEW COMPARISON:  08/20/2016 FINDINGS: Mild left retrocardiac opacity. No acute consolidation or pleural effusion. Emphysematous disease. Stable cardiomediastinal silhouette. No pneumothorax. Probable skin fold artifact over the left lower lateral chest. IMPRESSION: Emphysematous disease. Patchy left retrocardiac opacity, atelectasis versus mild infiltrate. Electronically Signed   By: Donavan Foil M.D.   On: 03/03/2020 19:29   VAS [place] LOWER EXTREMITY VENOUS (DVT)  Result Date: 03/05/2020  Lower Venous DVT Study Other Indications: Covid, D-Dimer. Comparison Study: No previous Performing Technologist: Vonzell Schlatter RVT  Examination Guidelines: A complete evaluation includes B-mode imaging, spectral Doppler, color Doppler, and power Doppler as needed of all accessible portions of each vessel. Bilateral testing is considered an integral part of a complete examination. Limited examinations for reoccurring indications may be performed as noted. The reflux portion of the exam is performed with the patient in reverse Trendelenburg.  +---------+---------------+---------+-----------+----------+--------------+ RIGHT    CompressibilityPhasicitySpontaneityPropertiesThrombus Aging +---------+---------------+---------+-----------+----------+--------------+ CFV      Full           Yes      Yes                                  +---------+---------------+---------+-----------+----------+--------------+ SFJ      Full                                                        +---------+---------------+---------+-----------+----------+--------------+ FV Prox  Full                                                        +---------+---------------+---------+-----------+----------+--------------+  FV Mid   Full                                                        +---------+---------------+---------+-----------+----------+--------------+ FV DistalFull                                                        +---------+---------------+---------+-----------+----------+--------------+ PFV      Full                                                        +---------+---------------+---------+-----------+----------+--------------+ POP      Full           Yes      Yes                                 +---------+---------------+---------+-----------+----------+--------------+ PTV      Full                                                        +---------+---------------+---------+-----------+----------+--------------+ PERO     Full                                                        +---------+---------------+---------+-----------+----------+--------------+   +---------+---------------+---------+-----------+----------+--------------+ LEFT     CompressibilityPhasicitySpontaneityPropertiesThrombus Aging +---------+---------------+---------+-----------+----------+--------------+ CFV      Full           Yes      Yes                                 +---------+---------------+---------+-----------+----------+--------------+ SFJ      Full                                                        +---------+---------------+---------+-----------+----------+--------------+ FV Prox  Full                                                         +---------+---------------+---------+-----------+----------+--------------+ FV Mid   Full                                                        +---------+---------------+---------+-----------+----------+--------------+  FV DistalFull                                                        +---------+---------------+---------+-----------+----------+--------------+ PFV      Full                                                        +---------+---------------+---------+-----------+----------+--------------+ POP      Full           Yes      Yes                                 +---------+---------------+---------+-----------+----------+--------------+ PTV      Full                                                        +---------+---------------+---------+-----------+----------+--------------+ PERO     Full                                                        +---------+---------------+---------+-----------+----------+--------------+     Summary: RIGHT: - There is no evidence of deep vein thrombosis in the lower extremity.  - No cystic structure found in the popliteal fossa.  LEFT: - There is no evidence of deep vein thrombosis in the lower extremity.  - No cystic structure found in the popliteal fossa.  *See table(s) above for measurements and observations. Electronically signed by Ruta Hinds MD on 03/05/2020 at 12:12:59 PM.    Final

## 2020-03-13 DIAGNOSIS — U071 COVID-19: Secondary | ICD-10-CM | POA: Diagnosis not present

## 2020-03-13 MED ORDER — IPRATROPIUM-ALBUTEROL 20-100 MCG/ACT IN AERS
1.0000 | INHALATION_SPRAY | Freq: Two times a day (BID) | RESPIRATORY_TRACT | Status: DC
Start: 2020-03-13 — End: 2020-03-15
  Administered 2020-03-13 – 2020-03-15 (×5): 1 via RESPIRATORY_TRACT
  Filled 2020-03-13: qty 4

## 2020-03-13 MED ORDER — IPRATROPIUM-ALBUTEROL 20-100 MCG/ACT IN AERS
1.0000 | INHALATION_SPRAY | Freq: Four times a day (QID) | RESPIRATORY_TRACT | Status: DC | PRN
  Filled 2020-03-13: qty 4

## 2020-03-13 NOTE — TOC Progression Note (Addendum)
Transition of Care [facility]) - Progression Note    Patient Details  Name: Donald Richardson MRN: [id number] Date of Birth: [birth year]  Transition of Care [facility] CM/SW Contact  Angelita Ingles, RN Phone Number: [phone]  03/13/2020, 10:50 AM  Clinical Narrative:     CM received call from Dr. Candiss Norse to inquire about why the patient doesn't have a bed. CM explained to MD that there are limited covid beds. MD states that patient will, be off of contact isolation today and would like for CM to follow up for bed availability once patient is off isolation. CM called [facility] to inquire about bed due to patient being taken off isolation today. Per Juliann Pulse at [facility] the patient is now considered recovered positive and the facility would not need another test. The facility would need to review patients info to make sure that he is asymptomatic and once cleared the patient can be placed on recovery unit. [facility] has no beds available at this time. CM will reach out to other facilities.   1415 CM called [facility] to inquire if the patient could be admitted although he is off of isolation here at [facility]. Per Perrin Smack the patient would still need to go to a covid bed and remain in isolation for 14 days. This is the facilities protocol. Anyone that has been positive for covid must be in isolation for 14 days at [facility]. All covid beds are currently full. The facility wants to open the covid unit but currently does not have approval. Cm will continue to follow up.   1430 CM called [facility] to determine bed availability. No answer, message has been left for [facility]. Will await return call.  1500 Message recieved from [facility]. There are currently no beds to accepts covid patients even if off of isolation. Per [facility] beds may be available next week.   Expected Discharge Plan: [place] Barriers to Discharge: Continued Medical Work up  Expected Discharge Plan and  Services Expected Discharge Plan: [place] In-house Referral: NA Discharge Planning Services: CM Consult Post Acute Care Choice: [place] arrangements for the past 2 months: Single Family Home                 DME Arranged: N/A DME Agency: NA       HH Arranged: RN,Nurse's Aide,PT,Social Work [employer] Agency: [facility] [employer] [facility] [employer] Date [place]: 03/07/20 Time [place]: 1010 Representative spoke with at [place]: [place] ([place]) Interventions    Readmission Risk Interventions No flowsheet data found.

## 2020-03-13 NOTE — Progress Notes (Signed)
PROGRESS NOTE                                                                                                                                                                                                             Patient Demographics:    Donald Richardson, is a 80 y.o. male, DOB - 1941-02-26, UYQ:[id number]  Outpatient Primary MD for the patient is Denita Lung, MD    LOS - 10  Admit date - 03/03/2020    Chief Complaint  Patient presents with  . Weakness       Brief Narrative (HPI from H&P) -  Donald Richardson is a 80 y.o. male with hx of CVA, Parkinson's, bradycardia, tobacco abuse, cardiovascular disease, CKD three, non-insulin-dependent diabetes, COPD who presented to the hospital on 03/03/2020 after neighbors checked on him and noticed he was not himself and he was found to be Covid positive, in the ER he found to Covid PNA, sinus bradycardia, dehydration and he was admitted to the hospital.   Subjective:   Patient in bed, appears comfortable, denies any headache, no fever, no chest pain or pressure, no shortness of breath , no abdominal pain. No focal weakness.   Assessment  & Plan :    Is clinically stable for discharge we await placement.   1. Acute Hypoxic Resp. Failure due to Acute Covid 19 Viral Pneumonitis during the ongoing 2020 Covid 19 Pandemic - he has received 2 shots of mRNA vaccine with last dose in February 2021 however has not received his third booster shot, he very mild disease and was treated with short course of steroids and remdesivir, on room air symptom-free, will come off of his quarantine on 03/14/2020, social work informed.  Medically stable.  Encouraged the patient to sit up in chair in the daytime use I-S and flutter valve for pulmonary toiletry and then prone in bed when at night.  Will advance activity and titrate down oxygen as possible.   No results for input(s): WBC, HGB, HCT, PLT, CRP, BNP,  DDIMER, PROCALCITON, AST, ALT, ALKPHOS, BILITOT, ALBUMIN, INR, LATICACIDVEN, SARSCOV2NAA in the last 168 hours.  Invalid input(s): NEUTRABS, BANDSABD  Lab Results  Component Value Date   TSH 0.345 (L) 03/04/2020    2. HX of sinus bradycardia at baseline, per brother and patient baseline and low 40s when he  is awake, he is not a candidate for pacemaker, discussed with patient's brother as well, medical treatment only.  Also discussed with his POA Dr. Wyatt Haste cardiologist, his TSH if anything is mildly suppressed suggesting sick euthyroid.  3.  History of smoking.  Counseled, he has refused nicotine patch.  4.  Underlying Parkinson's.  Continue carbidopa-levodopa.  PT OT consulted.  May require SNF if he agrees.  5.  History of COPD.  At baseline.  Supportive care   6.  CKD stage IIIb.  Baseline creatinine around 1.5.  At baseline continue to monitor.  7.  High D-dimer due to inflammation.  High risk for DVT, negative leg ultrasound, continue moderate dose Lovenox.   Placement and disposition has been challenging as patient is frequently changing his mind.  Social work looking for safe disposition.    Condition - Extremely Guarded  Family Communication  :  Steffanie Dunn [phone] on 03/04/2018 , Dr. Dorris Carnes POA on 03/04/2020.  Contacted brother on 03/07/2020.  Code Status :  DNR  Consults  :  None  Procedures  :    Leg ultrasound.  No DVT.  PUD Prophylaxis :   Disposition Plan  :    Status is: Inpatient  Remains inpatient appropriate because:IV treatments appropriate due to intensity of illness or inability to take PO   Dispo: The patient is from: Home              Anticipated d/c is to: Home              Anticipated d/c date is: > 3 days              Patient currently is not medically stable to d/c.  DVT Prophylaxis  :  Lovenox    Lab Results  Component Value Date   PLT 106 (L) 03/06/2020    Diet :  Diet Order            Diet Carb Modified Fluid  consistency: Thin; Room service appropriate? Yes  Diet effective now                  Inpatient Medications  Scheduled Meds: . aspirin EC  81 mg Oral Daily  . carbidopa-levodopa  1 tablet Oral 3 times per day  . clopidogrel  75 mg Oral Daily  . enoxaparin (LOVENOX) injection  40 mg Subcutaneous Q12H  . Ipratropium-Albuterol  1 puff Inhalation BID  . lisinopril  10 mg Oral Daily  . rosuvastatin  20 mg Oral QHS   Continuous Infusions:  PRN Meds:.acetaminophen, chlorpheniramine-HYDROcodone, guaiFENesin-dextromethorphan, Ipratropium-Albuterol, [DISCONTINUED] ondansetron **OR** ondansetron (ZOFRAN) IV  Antibiotics  :    Anti-infectives (From admission, onward)   Start     Dose/Rate Route Frequency Ordered Stop   03/04/20 1000  remdesivir 100 mg in sodium chloride 0.9 % 100 mL IVPB       "Followed by" Linked Group Details   100 mg 200 mL/hr over 30 Minutes Intravenous Daily 03/03/20 2110 03/07/20 1309   03/03/20 2115  remdesivir 200 mg in sodium chloride 0.9% 250 mL IVPB       "Followed by" Linked Group Details   200 mg 580 mL/hr over 30 Minutes Intravenous Once 03/03/20 2110 03/03/20 2334       Time Spent in minutes  30   Lala Lund M.D on 03/13/2020 at 10:32 AM  To page go to [email or web address]   [facility] -  Office  [phone]    See all Orders  from today for further details    Objective:   Vitals:   03/12/20 1603 03/12/20 2259 03/13/20 0630 03/13/20 0800  BP: 110/61 139/66 133/79 133/67  Pulse: (!) 50 (!) 54  (!) 47  Resp: 20 19  16   Temp: 98.1 F (36.7 C) 97.9 F (36.6 C) 97.9 F (36.6 C) 99 F (37.2 C)  TempSrc:   Oral Oral  SpO2: 98% 96% 96% 95%    Wt Readings from Last 3 Encounters:  10/13/19 59.9 kg  06/09/19 64 kg  03/16/19 64 kg     Intake/Output Summary (Last 24 hours) at 03/13/2020 1032 Last data filed at 03/12/2020 2300 Gross per 24 hour  Intake --  Output 975 ml  Net -975 ml     Physical Exam  Awake Alert, No new F.N  deficits, Normal affect [place].AT,PERRAL Supple Neck,No JVD, No cervical lymphadenopathy appriciated.  Symmetrical Chest wall movement, Good air movement bilaterally, CTAB RRR,No Gallops, Rubs or new Murmurs, No Parasternal Heave +ve B.Sounds, Abd Soft, No tenderness, No organomegaly appriciated, No rebound - guarding or rigidity. No Cyanosis, Clubbing or edema, No new Rash or bruise   Data Review:    CBC No results for input(s): WBC, HGB, HCT, PLT, MCV, MCH, MCHC, RDW, LYMPHSABS, MONOABS, EOSABS, BASOSABS, BANDABS in the last 168 hours.  Invalid input(s): NEUTRABS, BANDSABD  Recent Labs  Lab 03/10/20 0318  CREATININE 1.38*    ------------------------------------------------------------------------------------------------------------------ No results for input(s): CHOL, HDL, LDLCALC, TRIG, CHOLHDL, LDLDIRECT in the last 72 hours.  Lab Results  Component Value Date   HGBA1C 5.8 (H) 01/26/2018   ------------------------------------------------------------------------------------------------------------------ No results for input(s): TSH, T4TOTAL, T3FREE, THYROIDAB in the last 72 hours.  Invalid input(s): FREET3  Cardiac Enzymes No results for input(s): CKMB, TROPONINI, MYOGLOBIN in the last 168 hours.  Invalid input(s): CK ------------------------------------------------------------------------------------------------------------------    Component Value Date/Time   BNP 136.9 (H) 03/06/2020 0448    Micro Results Recent Results (from the past 240 hour(s))  Resp Panel by RT-PCR (Flu A&B, Covid) Nasopharyngeal Swab     Status: Abnormal   Collection Time: 03/03/20  3:29 PM   Specimen: Nasopharyngeal Swab; Nasopharyngeal(NP) swabs in vial transport medium  Result Value Ref Range Status   SARS Coronavirus 2 by RT PCR POSITIVE (A) NEGATIVE Final    Comment: RESULT CALLED TO, READ BACK BY AND VERIFIED WITH: RN C STRAUGHAN AT 1713 03/03/2020 BY L BENFIELD (NOTE) SARS-CoV-2 target  nucleic acids are DETECTED.  The SARS-CoV-2 RNA is generally detectable in upper respiratory specimens during the acute phase of infection. Positive results are indicative of the presence of the identified virus, but do not rule out bacterial infection or co-infection with other pathogens not detected by the test. Clinical correlation with patient history and other diagnostic information is necessary to determine patient infection status. The expected result is Negative.  Fact Sheet for Patients: EntrepreneurPulse.com.au  Fact Sheet for Healthcare Providers: IncredibleEmployment.be  This test is not yet approved or cleared by the [place] FDA and  has been authorized for detection and/or diagnosis of SARS-CoV-2 by FDA under an Emergency Use Authorization (EUA).  This EUA will remain in effect (meaning this  test can be used) for the duration of  the COVID-19 declaration under Section 564(b)(1) of the Act, 21 U.S.C. section 360bbb-3(b)(1), unless the authorization is terminated or revoked sooner.     Influenza A by PCR NEGATIVE NEGATIVE Final   Influenza B by PCR NEGATIVE NEGATIVE Final    Comment: (NOTE) The Xpert Xpress  SARS-CoV-2/FLU/RSV plus assay is intended as an aid in the diagnosis of influenza from Nasopharyngeal swab specimens and should not be used as a sole basis for treatment. Nasal washings and aspirates are unacceptable for Xpert Xpress SARS-CoV-2/FLU/RSV testing.  Fact Sheet for Patients: EntrepreneurPulse.com.au  Fact Sheet for Healthcare Providers: IncredibleEmployment.be  This test is not yet approved or cleared by the [place] FDA and has been authorized for detection and/or diagnosis of SARS-CoV-2 by FDA under an Emergency Use Authorization (EUA). This EUA will remain in effect (meaning this test can be used) for the duration of the COVID-19 declaration under Section  564(b)(1) of the Act, 21 U.S.C. section 360bbb-3(b)(1), unless the authorization is terminated or revoked.  Performed at [facility], [place] [address] [place], [place] [address]     Radiology Reports DG Chest Port 1 View  Result Date: 03/03/2020 CLINICAL DATA:  Cough COVID EXAM: PORTABLE CHEST 1 VIEW COMPARISON:  08/20/2016 FINDINGS: Mild left retrocardiac opacity. No acute consolidation or pleural effusion. Emphysematous disease. Stable cardiomediastinal silhouette. No pneumothorax. Probable skin fold artifact over the left lower lateral chest. IMPRESSION: Emphysematous disease. Patchy left retrocardiac opacity, atelectasis versus mild infiltrate. Electronically Signed   By: Donavan Foil M.D.   On: 03/03/2020 19:29   VAS [place] LOWER EXTREMITY VENOUS (DVT)  Result Date: 03/05/2020  Lower Venous DVT Study Other Indications: Covid, D-Dimer. Comparison Study: No previous Performing Technologist: Vonzell Schlatter RVT  Examination Guidelines: A complete evaluation includes B-mode imaging, spectral Doppler, color Doppler, and power Doppler as needed of all accessible portions of each vessel. Bilateral testing is considered an integral part of a complete examination. Limited examinations for reoccurring indications may be performed as noted. The reflux portion of the exam is performed with the patient in reverse Trendelenburg.  +---------+---------------+---------+-----------+----------+--------------+ RIGHT    CompressibilityPhasicitySpontaneityPropertiesThrombus Aging +---------+---------------+---------+-----------+----------+--------------+ CFV      Full           Yes      Yes                                 +---------+---------------+---------+-----------+----------+--------------+ SFJ      Full                                                        +---------+---------------+---------+-----------+----------+--------------+ FV Prox  Full                                                         +---------+---------------+---------+-----------+----------+--------------+ FV Mid   Full                                                        +---------+---------------+---------+-----------+----------+--------------+ FV DistalFull                                                        +---------+---------------+---------+-----------+----------+--------------+  PFV      Full                                                        +---------+---------------+---------+-----------+----------+--------------+ POP      Full           Yes      Yes                                 +---------+---------------+---------+-----------+----------+--------------+ PTV      Full                                                        +---------+---------------+---------+-----------+----------+--------------+ PERO     Full                                                        +---------+---------------+---------+-----------+----------+--------------+   +---------+---------------+---------+-----------+----------+--------------+ LEFT     CompressibilityPhasicitySpontaneityPropertiesThrombus Aging +---------+---------------+---------+-----------+----------+--------------+ CFV      Full           Yes      Yes                                 +---------+---------------+---------+-----------+----------+--------------+ SFJ      Full                                                        +---------+---------------+---------+-----------+----------+--------------+ FV Prox  Full                                                        +---------+---------------+---------+-----------+----------+--------------+ FV Mid   Full                                                        +---------+---------------+---------+-----------+----------+--------------+ FV DistalFull                                                         +---------+---------------+---------+-----------+----------+--------------+ PFV      Full                                                        +---------+---------------+---------+-----------+----------+--------------+   POP      Full           Yes      Yes                                 +---------+---------------+---------+-----------+----------+--------------+ PTV      Full                                                        +---------+---------------+---------+-----------+----------+--------------+ PERO     Full                                                        +---------+---------------+---------+-----------+----------+--------------+     Summary: RIGHT: - There is no evidence of deep vein thrombosis in the lower extremity.  - No cystic structure found in the popliteal fossa.  LEFT: - There is no evidence of deep vein thrombosis in the lower extremity.  - No cystic structure found in the popliteal fossa.  *See table(s) above for measurements and observations. Electronically signed by Ruta Hinds MD on 03/05/2020 at 12:12:59 PM.    Final

## 2020-03-14 DIAGNOSIS — U071 COVID-19: Secondary | ICD-10-CM | POA: Diagnosis not present

## 2020-03-14 NOTE — Progress Notes (Signed)
Physical Therapy Treatment Patient Details Name: Donald Richardson MRN: [id number] DOB: March 16, 1940 Today's Date: 03/14/2020    History of Present Illness 80 y.o. male with hx of CVA, Parkinson's, bradycardia, tobacco abuse, cardiovascular disease, CKD three, non-insulin-dependent diabetes, COPD who presented to the hospital on 03/03/2020 after neighbors checked on him and noticed he was not himself, weak and cognitively altered, he was found to be Covid positive.    PT Comments    PT woke pt from a sound sleep.  He is moving with a bit more effort today, slower, more flat, not able to walk as far down the hallway as before.  Vital signs remain stable on RA, however, I believe he is getting weaker.  He needs to move more and have more aggressive therapy.  Pt was also incontinent in the bed as well as in the hallway despite offerings to take him to the bathroom from PT.  RN tech in room for hand off as pt was in the restroom trying to have a BM at the end of session.  PT will continue to follow acutely for safe mobility progression.  Continue to attempt to alternate with OT to spread out his weekly therapy sessions so he can be seen daily.    Follow Up Recommendations  SNF;Supervision for mobility/OOB     Equipment Recommendations  None recommended by PT    Recommendations for Other Services       Precautions / Restrictions Precautions Precautions: Fall Precaution Comments: likely fall risk at baseline with h/o Parkinson's    Mobility  Bed Mobility Overal bed mobility: Needs Assistance Bed Mobility: Supine to Sit     Supine to sit: Min assist;HOB elevated     General bed mobility comments: Attempted to let pt get up on his own, but after multiple attempts, extra time and heavy use of the rail he continued to struggle even with HOB height ~30 degrees.  Min assist provided to get up.  Transfers Overall transfer level: Needs assistance Equipment used: Rolling walker (2  wheeled) Transfers: Sit to/from Stand Sit to Stand: Min assist         General transfer comment: Min assist to stabilize RW and support trunk during transitions.  Once up and fowrard momentum established, less assist needed (min guard).  Ambulation/Gait Ambulation/Gait assistance: Min guard Gait Distance (Feet): 75 Feet Assistive device: Rolling walker (2 wheeled) Gait Pattern/deviations: Step-through pattern;Shuffle;Trunk flexed Gait velocity: decreased (slower than previous session) Gait velocity interpretation: <1.31 ft/sec, indicative of household ambulator General Gait Details: Pt seemed much slower today, more fatigued, unable to go as far or as fast with gait speed.  Reported he did not feel well, also may be time of day (timing with Parkinson's Meds) or the fact that I woke him from a sound sleep.   Stairs             Wheelchair Mobility    Modified Rankin (Stroke Patients Only)       Balance Overall balance assessment: Needs assistance Sitting-balance support: Feet supported;Bilateral upper extremity supported Sitting balance-Leahy Scale: Fair     Standing balance support: Bilateral upper extremity supported Standing balance-Leahy Scale: Poor Standing balance comment: needs support from RW and therapist                            Cognition Arousal/Alertness: Lethargic (PT woke him up from sleeping) Behavior During Therapy: [facility] for tasks assessed/performed Overall Cognitive Status: No family/caregiver  present to determine baseline cognitive functioning                                 General Comments: Pt is very [facility] which impairs comprehension, processing, communication.      Exercises      General Comments General comments (skin integrity, edema, etc.): incontinent of urine in the hallway despite offering to take him to the bathroom before we walked.  Also, incontinent in the bed (RN techs to re-apply condom catheter--were  trying urinal because condom caths kept falling off).      Pertinent Vitals/Pain Pain Assessment: No/denies pain    Home Living                      Prior Function            PT Goals (current goals can now be found in the care plan section) Acute Rehab PT Goals Patient Stated Goal: to go home Progress towards PT goals: Not progressing toward goals - comment (seemed more fatigued today)    Frequency    Min 3X/week      PT Plan Current plan remains appropriate    Co-evaluation              AM-PAC PT "6 Clicks" Mobility   Outcome Measure  Help needed turning from your back to your side while in a flat bed without using bedrails?: A Little Help needed moving from lying on your back to sitting on the side of a flat bed without using bedrails?: A Little Help needed moving to and from a bed to a chair (including a wheelchair)?: A Little Help needed standing up from a chair using your arms (e.g., wheelchair or bedside chair)?: A Little Help needed to walk in hospital room?: A Little Help needed climbing 3-5 steps with a railing? : A Lot 6 Click Score: 17    End of Session Equipment Utilized During Treatment: Gait belt Activity Tolerance: Patient limited by fatigue Patient left: in chair;with call bell/phone within reach;with chair alarm set   PT Visit Diagnosis: Other abnormalities of gait and mobility (R26.89);Muscle weakness (generalized) (M62.81)     Time: 0160-1093 PT Time Calculation (min) (ACUTE ONLY): 35 min  Charges:  $Gait Training: 8-22 mins $Therapeutic Activity: 8-22 mins                     Verdene Lennert, PT, DPT  Acute Rehabilitation [phone] pager [phone]) [phone] office

## 2020-03-14 NOTE — TOC Progression Note (Signed)
Transition of Care [facility]) - Progression Note    Patient Details  Name: Donald Richardson MRN: [id number] Date of Birth: 04/03/1940  Transition of Care [facility]) CM/SW [place], RN Phone Number: [phone]  03/14/2020, 8:46 AM  Clinical Narrative:    [facility] has opened Covid beds. CM called [facility] to inquire if bed offer can be made on this patient. Perrin Smack will review patient info in the [place] and get back with CM later this morning.    Expected Discharge Plan: [place] Barriers to Discharge: Continued Medical Work up  Expected Discharge Plan and Services Expected Discharge Plan: [place] In-house Referral: NA Discharge Planning Services: CM Consult Post Acute Care Choice: [place] arrangements for the past 2 months: Single Family Home                 DME Arranged: N/A DME Agency: NA       HH Arranged: RN,Nurse's Aide,PT,Social Work [employer] Agency: [facility] [employer] [facility] [employer] Date [place]: 03/07/20 Time [place]: 1010 Representative spoke with at [place]: [place] ([place]) Interventions    Readmission Risk Interventions No flowsheet data found.

## 2020-03-14 NOTE — Progress Notes (Signed)
PROGRESS NOTE                                                                                                                                                                                                             Patient Demographics:    Donald Richardson, is a 80 y.o. male, DOB - August 31, 1940, LE:[id number]  Outpatient Primary MD for the patient is Denita Lung, MD    LOS - 11  Admit date - 03/03/2020    Chief Complaint  Patient presents with  . Weakness       Brief Narrative (HPI from H&P) -  Donald Richardson is a 80 y.o. male with hx of CVA, Parkinson's, bradycardia, tobacco abuse, cardiovascular disease, CKD three, non-insulin-dependent diabetes, COPD who presented to the hospital on 03/03/2020 after neighbors checked on him and noticed he was not himself and he was found to be Covid positive, in the ER he found to Covid PNA, sinus bradycardia, dehydration and he was admitted to the hospital.  He has finished his inpatient treatment and now close to baseline, due to advanced deconditioning and living alone he needs SNF bed for which we are waiting.   Subjective:   Patient in bed, appears comfortable, denies any headache, no fever, no chest pain or pressure, no shortness of breath , no abdominal pain. No focal weakness.   Assessment  & Plan :    Is clinically stable for discharge we await placement.   1. Acute Hypoxic Resp. Failure due to Acute Covid 19 Viral Pneumonitis during the ongoing 2020 Covid 19 Pandemic - he has received 2 shots of mRNA vaccine with last dose in February 2021 however has not received his third booster shot, he very mild disease and was treated with short course of steroids and remdesivir, on room air symptom-free, will come off of his quarantine on 03/14/2020, social work informed.  Medically stable.  Encouraged the patient to sit up in chair in the daytime use I-S and flutter valve for pulmonary  toiletry and then prone in bed when at night.  Will advance activity and titrate down oxygen as possible.   No results for input(s): WBC, HGB, HCT, PLT, CRP, BNP, DDIMER, PROCALCITON, AST, ALT, ALKPHOS, BILITOT, ALBUMIN, INR, LATICACIDVEN, SARSCOV2NAA in the last 168 hours.  Invalid input(s): NEUTRABS, BANDSABD  Lab Results  Component  Value Date   TSH 0.345 (L) 03/04/2020    2. 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  Also discussed with his POA Dr. Wyatt Haste cardiologist, his TSH if anything is mildly suppressed suggesting sick euthyroid.  3.  History of smoking.  Counseled, he has refused nicotine patch.  4.  Underlying Parkinson's.  Continue carbidopa-levodopa.  PT OT consulted.  May require SNF if he agrees.  5.  History of COPD.  At baseline.  Supportive care   6.  CKD stage IIIb.  Baseline creatinine around 1.5.  At baseline continue to monitor.  7.  High D-dimer due to inflammation.  High risk for DVT, negative leg ultrasound, continue moderate dose Lovenox.   Placement and disposition has been challenging as patient is frequently changing his mind.  Social work looking for safe disposition.    Condition - Extremely Guarded  Family Communication  :  Steffanie Dunn [phone] on 03/04/2018 , Dr. Dorris Carnes POA on 03/04/2020.  Contacted brother on 03/07/2020.  Code Status :  DNR  Consults  :  None  Procedures  :    Leg ultrasound.  No DVT.  PUD Prophylaxis :   Disposition Plan  :    Status is: Inpatient  Remains inpatient appropriate because:IV treatments appropriate due to intensity of illness or inability to take PO   Dispo: The patient is from: Home              Anticipated d/c is to: Home              Anticipated d/c date is: > 3 days              Patient currently is not medically stable to  d/c.  DVT Prophylaxis  :  Lovenox    Lab Results  Component Value Date   PLT 106 (L) 03/06/2020    Diet :  Diet Order            Diet Carb Modified Fluid consistency: Thin; Room service appropriate? Yes  Diet effective now                  Inpatient Medications  Scheduled Meds: . aspirin EC  81 mg Oral Daily  . carbidopa-levodopa  1 tablet Oral 3 times per day  . clopidogrel  75 mg Oral Daily  . enoxaparin (LOVENOX) injection  40 mg Subcutaneous Q12H  . Ipratropium-Albuterol  1 puff Inhalation BID  . lisinopril  10 mg Oral Daily  . rosuvastatin  20 mg Oral QHS   Continuous Infusions:  PRN Meds:.acetaminophen, chlorpheniramine-HYDROcodone, guaiFENesin-dextromethorphan, Ipratropium-Albuterol, [DISCONTINUED] ondansetron **OR** ondansetron (ZOFRAN) IV  Antibiotics  :    Anti-infectives (From admission, onward)   Start     Dose/Rate Route Frequency Ordered Stop   03/04/20 1000  remdesivir 100 mg in sodium chloride 0.9 % 100 mL IVPB       "Followed by" Linked Group Details   100 mg 200 mL/hr over 30 Minutes Intravenous Daily 03/03/20 2110 03/07/20 1309   03/03/20 2115  remdesivir 200 mg in sodium chloride 0.9% 250 mL IVPB       "Followed by" Linked Group Details   200 mg 580 mL/hr over 30 Minutes Intravenous Once 03/03/20 2110 03/03/20 2334       Time Spent in minutes  30   Seriah Brotzman  Candiss Norse M.D on 03/14/2020 at 8:31 AM  To page go to [email or web address]   [facility] -  Office  [phone]    See all Orders from today for further details    Objective:   Vitals:   03/13/20 0630 03/13/20 0800 03/13/20 2054 03/14/20 0459  BP: 133/79 133/67 137/60 121/64  Pulse:  (!) 47 (!) 48 (!) 58  Resp:  16 20 20   Temp: 97.9 F (36.6 C) 99 F (37.2 C) 98 F (36.7 C) 97.9 F (36.6 C)  TempSrc: Oral Oral    SpO2: 96% 95% 95% 95%    Wt Readings from Last 3 Encounters:  10/13/19 59.9 kg  06/09/19 64 kg  03/16/19 64 kg     Intake/Output Summary (Last 24 hours)  at 03/14/2020 0831 Last data filed at 03/14/2020 0500 Gross per 24 hour  Intake --  Output 650 ml  Net -650 ml     Physical Exam  Awake Alert, No new F.N deficits,   [place].AT,PERRAL Supple Neck,No JVD, No cervical lymphadenopathy appriciated.  Symmetrical Chest wall movement, Good air movement bilaterally, CTAB RRR,No Gallops, Rubs or new Murmurs, No Parasternal Heave +ve B.Sounds, Abd Soft, No tenderness, No organomegaly appriciated, No rebound - guarding or rigidity. No Cyanosis, Clubbing or edema, No new Rash or bruise    Data Review:    CBC No results for input(s): WBC, HGB, HCT, PLT, MCV, MCH, MCHC, RDW, LYMPHSABS, MONOABS, EOSABS, BASOSABS, BANDABS in the last 168 hours.  Invalid input(s): NEUTRABS, BANDSABD  Recent Labs  Lab 03/10/20 0318  CREATININE 1.38*    ------------------------------------------------------------------------------------------------------------------ No results for input(s): CHOL, HDL, LDLCALC, TRIG, CHOLHDL, LDLDIRECT in the last 72 hours.  Lab Results  Component Value Date   HGBA1C 5.8 (H) 01/26/2018   ------------------------------------------------------------------------------------------------------------------ No results for input(s): TSH, T4TOTAL, T3FREE, THYROIDAB in the last 72 hours.  Invalid input(s): FREET3  Cardiac Enzymes No results for input(s): CKMB, TROPONINI, MYOGLOBIN in the last 168 hours.  Invalid input(s): CK ------------------------------------------------------------------------------------------------------------------    Component Value Date/Time   BNP 136.9 (H) 03/06/2020 0448    Micro Results No results found for this or any previous visit (from the past 240 hour(s)).  Radiology Reports DG Chest Port 1 View  Result Date: 03/03/2020 CLINICAL DATA:  Cough COVID EXAM: PORTABLE CHEST 1 VIEW COMPARISON:  08/20/2016 FINDINGS: Mild left retrocardiac opacity. No acute consolidation or pleural effusion.  Emphysematous disease. Stable cardiomediastinal silhouette. No pneumothorax. Probable skin fold artifact over the left lower lateral chest. IMPRESSION: Emphysematous disease. Patchy left retrocardiac opacity, atelectasis versus mild infiltrate. Electronically Signed   By: Donavan Foil M.D.   On: 03/03/2020 19:29   VAS [place] LOWER EXTREMITY VENOUS (DVT)  Result Date: 03/05/2020  Lower Venous DVT Study Other Indications: Covid, D-Dimer. Comparison Study: No previous Performing Technologist: Vonzell Schlatter RVT  Examination Guidelines: A complete evaluation includes B-mode imaging, spectral Doppler, color Doppler, and power Doppler as needed of all accessible portions of each vessel. Bilateral testing is considered an integral part of a complete examination. Limited examinations for reoccurring indications may be performed as noted. The reflux portion of the exam is performed with the patient in reverse Trendelenburg.  +---------+---------------+---------+-----------+----------+--------------+ RIGHT    CompressibilityPhasicitySpontaneityPropertiesThrombus Aging +---------+---------------+---------+-----------+----------+--------------+ CFV      Full           Yes      Yes                                 +---------+---------------+---------+-----------+----------+--------------+  SFJ      Full                                                        +---------+---------------+---------+-----------+----------+--------------+ FV Prox  Full                                                        +---------+---------------+---------+-----------+----------+--------------+ FV Mid   Full                                                        +---------+---------------+---------+-----------+----------+--------------+ FV DistalFull                                                        +---------+---------------+---------+-----------+----------+--------------+ PFV      Full                                                         +---------+---------------+---------+-----------+----------+--------------+ POP      Full           Yes      Yes                                 +---------+---------------+---------+-----------+----------+--------------+ PTV      Full                                                        +---------+---------------+---------+-----------+----------+--------------+ PERO     Full                                                        +---------+---------------+---------+-----------+----------+--------------+   +---------+---------------+---------+-----------+----------+--------------+ LEFT     CompressibilityPhasicitySpontaneityPropertiesThrombus Aging +---------+---------------+---------+-----------+----------+--------------+ CFV      Full           Yes      Yes                                 +---------+---------------+---------+-----------+----------+--------------+ SFJ      Full                                                        +---------+---------------+---------+-----------+----------+--------------+  FV Prox  Full                                                        +---------+---------------+---------+-----------+----------+--------------+ FV Mid   Full                                                        +---------+---------------+---------+-----------+----------+--------------+ FV DistalFull                                                        +---------+---------------+---------+-----------+----------+--------------+ PFV      Full                                                        +---------+---------------+---------+-----------+----------+--------------+ POP      Full           Yes      Yes                                 +---------+---------------+---------+-----------+----------+--------------+ PTV      Full                                                         +---------+---------------+---------+-----------+----------+--------------+ PERO     Full                                                        +---------+---------------+---------+-----------+----------+--------------+     Summary: RIGHT: - There is no evidence of deep vein thrombosis in the lower extremity.  - No cystic structure found in the popliteal fossa.  LEFT: - There is no evidence of deep vein thrombosis in the lower extremity.  - No cystic structure found in the popliteal fossa.  *See table(s) above for measurements and observations. Electronically signed by Ruta Hinds MD on 03/05/2020 at 12:12:59 PM.    Final

## 2020-03-14 NOTE — TOC Progression Note (Addendum)
Transition of Care [facility]) - Progression Note    Patient Details  Name: Donald Richardson MRN: [id number] Date of Birth: Mar 20, 1940  Transition of [facility] CM/SW [place], RN Phone Number: [phone]  03/14/2020, 4:08 PM  Clinical Narrative:    Message received in [place] that patient has bed offer. No answer at [facility]. CM spoke with brother Prathik Aman who is agreeable with bed offer. Will accept offer in the HUB and plan for early morning discharge.  1115 d/c summary sent via HUB  Expected Discharge Plan: [place] Barriers to Discharge: Continued Medical Work up  Expected Discharge Plan and Services Expected Discharge Plan: [place] In-house Referral: NA Discharge Planning Services: CM Consult Post Acute Care Choice: [place] arrangements for the past 2 months: Single Family Home                 DME Arranged: N/A DME Agency: NA       HH Arranged: RN,Nurse's Aide,PT,Social Work [employer] Agency: [facility] [employer] [facility] [employer] Date [place]: 03/07/20 Time [place]: 1010 Representative spoke with at [place]: [place] ([place]) Interventions    Readmission Risk Interventions No flowsheet data found.

## 2020-03-15 DIAGNOSIS — R41841 Cognitive communication deficit: Secondary | ICD-10-CM | POA: Diagnosis not present

## 2020-03-15 DIAGNOSIS — R2681 Unsteadiness on feet: Secondary | ICD-10-CM | POA: Diagnosis not present

## 2020-03-15 DIAGNOSIS — E1122 Type 2 diabetes mellitus with diabetic chronic kidney disease: Secondary | ICD-10-CM | POA: Diagnosis not present

## 2020-03-15 DIAGNOSIS — J9601 Acute respiratory failure with hypoxia: Secondary | ICD-10-CM | POA: Diagnosis not present

## 2020-03-15 DIAGNOSIS — N183 Chronic kidney disease, stage 3 unspecified: Secondary | ICD-10-CM | POA: Diagnosis not present

## 2020-03-15 DIAGNOSIS — I69398 Other sequelae of cerebral infarction: Secondary | ICD-10-CM | POA: Diagnosis not present

## 2020-03-15 DIAGNOSIS — I129 Hypertensive chronic kidney disease with stage 1 through stage 4 chronic kidney disease, or unspecified chronic kidney disease: Secondary | ICD-10-CM | POA: Diagnosis not present

## 2020-03-15 DIAGNOSIS — I69391 Dysphagia following cerebral infarction: Secondary | ICD-10-CM | POA: Diagnosis not present

## 2020-03-15 DIAGNOSIS — R5381 Other malaise: Secondary | ICD-10-CM | POA: Diagnosis not present

## 2020-03-15 DIAGNOSIS — J1282 Pneumonia due to coronavirus disease 2019: Secondary | ICD-10-CM | POA: Diagnosis not present

## 2020-03-15 DIAGNOSIS — L853 Xerosis cutis: Secondary | ICD-10-CM | POA: Diagnosis not present

## 2020-03-15 DIAGNOSIS — Z8673 Personal history of transient ischemic attack (TIA), and cerebral infarction without residual deficits: Secondary | ICD-10-CM | POA: Diagnosis not present

## 2020-03-15 DIAGNOSIS — Z20818 Contact with and (suspected) exposure to other bacterial communicable diseases: Secondary | ICD-10-CM | POA: Diagnosis not present

## 2020-03-15 DIAGNOSIS — G20 Parkinson's disease: Secondary | ICD-10-CM | POA: Diagnosis not present

## 2020-03-15 DIAGNOSIS — R269 Unspecified abnormalities of gait and mobility: Secondary | ICD-10-CM | POA: Diagnosis not present

## 2020-03-15 DIAGNOSIS — R531 Weakness: Secondary | ICD-10-CM | POA: Diagnosis not present

## 2020-03-15 DIAGNOSIS — M2550 Pain in unspecified joint: Secondary | ICD-10-CM | POA: Diagnosis not present

## 2020-03-15 DIAGNOSIS — R1312 Dysphagia, oropharyngeal phase: Secondary | ICD-10-CM | POA: Diagnosis not present

## 2020-03-15 DIAGNOSIS — M6281 Muscle weakness (generalized): Secondary | ICD-10-CM | POA: Diagnosis not present

## 2020-03-15 DIAGNOSIS — E785 Hyperlipidemia, unspecified: Secondary | ICD-10-CM | POA: Diagnosis not present

## 2020-03-15 DIAGNOSIS — Z7401 Bed confinement status: Secondary | ICD-10-CM | POA: Diagnosis not present

## 2020-03-15 DIAGNOSIS — N1831 Chronic kidney disease, stage 3a: Secondary | ICD-10-CM | POA: Diagnosis not present

## 2020-03-15 DIAGNOSIS — R001 Bradycardia, unspecified: Secondary | ICD-10-CM | POA: Diagnosis not present

## 2020-03-15 DIAGNOSIS — U071 COVID-19: Secondary | ICD-10-CM | POA: Diagnosis not present

## 2020-03-15 DIAGNOSIS — J449 Chronic obstructive pulmonary disease, unspecified: Secondary | ICD-10-CM | POA: Diagnosis not present

## 2020-03-15 DIAGNOSIS — I2510 Atherosclerotic heart disease of native coronary artery without angina pectoris: Secondary | ICD-10-CM | POA: Diagnosis not present

## 2020-03-15 LAB — CBC
HCT: 39.1 % (ref 39.0–52.0)
Hemoglobin: 12.9 g/dL — ABNORMAL LOW (ref 13.0–17.0)
MCH: 30.2 pg (ref 26.0–34.0)
MCHC: 33 g/dL (ref 30.0–36.0)
MCV: 91.6 fL (ref 80.0–100.0)
Platelets: 142 10*3/uL — ABNORMAL LOW (ref 150–400)
RBC: 4.27 MIL/uL (ref 4.22–5.81)
RDW: 13.9 % (ref 11.5–15.5)
WBC: 11.8 10*3/uL — ABNORMAL HIGH (ref 4.0–10.5)
nRBC: 0 % (ref 0.0–0.2)

## 2020-03-15 LAB — BASIC METABOLIC PANEL
Anion gap: 10 (ref 5–15)
BUN: 34 mg/dL — ABNORMAL HIGH (ref 8–23)
CO2: 23 mmol/L (ref 22–32)
Calcium: 8.3 mg/dL — ABNORMAL LOW (ref 8.9–10.3)
Chloride: 105 mmol/L (ref 98–111)
Creatinine, Ser: 1.63 mg/dL — ABNORMAL HIGH (ref 0.61–1.24)
GFR, Estimated: 43 mL/min — ABNORMAL LOW (ref >60)
Glucose, Bld: 110 mg/dL — ABNORMAL HIGH (ref 70–99)
Potassium: 4.3 mmol/L (ref 3.5–5.1)
Sodium: 138 mmol/L (ref 135–145)

## 2020-03-15 MED ORDER — ALBUTEROL SULFATE HFA 108 (90 BASE) MCG/ACT IN AERS: 2.0000 | INHALATION_SPRAY | Freq: Four times a day (QID) | RESPIRATORY_TRACT | 0 refills | Status: DC | PRN

## 2020-03-15 NOTE — TOC Transition Note (Signed)
Transition of Care [facility]) - CM/SW Discharge Note   Patient Details  Name: Donald Richardson MRN: [id number] Date of Birth: Jul 10, 1940  [facility] CM/SW Contact:  Angelita Ingles, RN Phone Number: [phone]  03/15/2020, 11:59 AM   Clinical Narrative:    Patient cleared to discharge to [facility]. Bedside nurse made aware. Transport has been set up with [facility]. Brother has been notified.   Please call report to  [facility] Room # [id number]   Final next level of care: Skilled Nursing Facility Barriers to Discharge: No Barriers Identified   Patient Goals and CMS Choice Patient states their goals for this hospitalization and ongoing recovery are:: Patient states tha he is ready to go home as soon as possible CMS Medicare.gov Compare Post Acute Care list provided to:: Patient Choice offered to / list presented to : Patient  Discharge Placement              Patient [facility] bed at: [facility] Patient to be transferred to facility by: [facility] Name of family member notified: Travaris Kosh brother Patient and family notified of of transfer: 03/15/20  Discharge Plan and Services In-house Referral: NA Discharge Planning Services: CM Consult Post Acute Care Choice: Home Health          DME Arranged: N/A DME Agency: NA       HH Arranged: NA HH Agency: NA Date HH Agency Contacted: 03/07/20 Time HH Agency Contacted: 1010 Representative spoke with at [place]: [place] ([place]) Interventions     Readmission Risk Interventions No flowsheet data found.

## 2020-03-15 NOTE — Care Management Important Message (Signed)
Important Message  Patient Details  Name: Donald Richardson MRN: [id number] Date of Birth: November 04, 1940   Medicare Important Message Given:  Yes  Patient left prior to IM delivery.  IM mailed to the patient home address.    Salvatore Shear 03/15/2020, 3:44 PM

## 2020-03-15 NOTE — Discharge Instructions (Signed)
Follow with Primary MD Denita Lung, MD in 7 days   Get CBC, CMP, 2 view Chest X ray -  checked next visit within 1 week by Primary MD or SNF MD   Activity: As tolerated with Full fall precautions use walker/cane & assistance as needed  Disposition SNF  Diet: Soft Heart Healthy Low Carb , with feeding assistance and aspiration precautions.  Special Instructions: If you have smoked or chewed Tobacco  in the last 2 yrs please stop smoking, stop any regular Alcohol  and or any Recreational drug use.  On your next visit with your primary care physician please Get Medicines reviewed and adjusted.  Please request your Prim.MD to go over all Hospital Tests and Procedure/Radiological results at the follow up, please get all Hospital records sent to your Prim MD by signing hospital release before you go home.  If you experience worsening of your admission symptoms, develop shortness of breath, life threatening emergency, suicidal or homicidal thoughts you must seek medical attention immediately by calling 911 or calling your MD immediately  if symptoms less severe.  You Must read complete instructions/literature along with all the possible adverse reactions/side effects for all the Medicines you take and that have been prescribed to you. Take any new Medicines after you have completely understood and accpet all the possible adverse reactions/side effects.

## 2020-03-15 NOTE — Discharge Summary (Signed)
Donald Richardson [id number] DOB: Dec 20, 1940 DOA: 03/03/2020  PCP: Denita Lung, MD  Admit date: 03/03/2020  Discharge date: 03/15/2020  Admitted From: Home   Disposition:  SNF   Recommendations for Outpatient Follow-up:   Follow up with PCP in 1-2 weeks  PCP Please obtain BMP/CBC, 2 view CXR in 1week,  (see Discharge instructions)   PCP Please follow up on the following pending results: BC, CMP, magnesium, TSH, two view chest x-ray in 7 to 10 days.   Home Health: None   Equipment/Devices: None  Consultations: None Discharge Condition: Stable    CODE STATUS: Full    Diet Recommendation: Soft Heart Healthy low carbohydrate diet.     Chief Complaint  Patient presents with  . Weakness     Brief history of present illness from the day of admission and additional interim summary    Donald Richardson a 80 y.o.malewith hxof CVA, Parkinson's, bradycardia, tobacco abuse, cardiovascular disease, CKD three, non-insulin-dependent diabetes, COPDwhopresented to the hospital on 1/1/2022after neighbors checked on him and noticed he was not himself and he was found to be Covid positive, in the ER he found to Covid PNA, sinus bradycardia, dehydration and he was admitted to the hospital.  He has finished his inpatient treatment and now close to baseline, due to advanced deconditioning and living alone he needs SNF bed for which we are waiting.                                                                 Hospital Course    1. Acute Hypoxic Resp. Failure due to Acute Covid 19 Viral Pneumonitis during the ongoing 2020 Covid 19 Pandemic - he has received 2 shots of mRNA vaccine with last dose in February 2021 however has not received his third booster shot, he had very mild disease and was treated with short course of  steroids and remdesivir, on room air symptom-free, will come off of his quarantine on 03/14/2020, social work informed.  Medically stable, DC to SNF.   2. 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  Also discussed with his POA Dr. Wyatt Haste cardiologist, his TSH if anything is mildly suppressed suggesting sick euthyroid.  3.  History of smoking.  Counseled, he has refused nicotine patch.  4.  Underlying Parkinson's.  Continue carbidopa-levodopa.  PT OT consulted.    He will require SNF upon discharge due to generalized weakness and lack of support at home.  5.  History of COPD.  At baseline.  Supportive care   6.  CKD stage IIIb.  Baseline creatinine around 1.5.  At baseline continue to monitor.  7.  High D-dimer due to inflammation.    With moderate dose Lovenox much improved, leg ultrasound negative.   Discharge diagnosis     Principal Problem:   COVID-19 Active Problems:   Bradycardia   Glucose intolerance   COLD (chronic obstructive lung disease) (HCC)   CKD (chronic kidney disease), stage III (HCC)   Parkinson's disease ([place])   Acute respiratory failure with hypoxia [facility])    Discharge instructions    Discharge Instructions    Discharge instructions   Complete by: As directed    Follow with Primary MD Denita Lung, MD in 7 days   Get CBC, CMP, 2 view Chest X ray -  checked next visit within 1 week by Primary MD or SNF MD   Activity: As tolerated with Full fall precautions use walker/cane & assistance as needed  Disposition SNF  Diet: Soft Heart Healthy Low Carb , with feeding assistance and aspiration precautions.  Special Instructions: If you have smoked or chewed Tobacco  in the last 2 yrs please stop smoking, stop any regular Alcohol  and or any Recreational drug use.  On your next visit with your  primary care physician please Get Medicines reviewed and adjusted.  Please request your Prim.MD to go over all Hospital Tests and Procedure/Radiological results at the follow up, please get all Hospital records sent to your Prim MD by signing hospital release before you go home.  If you experience worsening of your admission symptoms, develop shortness of breath, life threatening emergency, suicidal or homicidal thoughts you must seek medical attention immediately by calling 911 or calling your MD immediately  if symptoms less severe.  You Must read complete instructions/literature along with all the possible adverse reactions/side effects for all the Medicines you take and that have been prescribed to you. Take any new Medicines after you have completely understood and accpet all the possible adverse reactions/side effects.   Increase activity slowly   Complete by: As directed       Discharge Medications   Allergies as of 03/15/2020   No Known Allergies     Medication List    TAKE these medications   albuterol 108 (90 Base) MCG/ACT inhaler Commonly known as: VENTOLIN HFA Inhale 2 puffs into the lungs every 6 (six) hours as needed for wheezing or shortness of breath.   aspirin EC 81 MG tablet Take 81 mg by mouth daily. Swallow whole.   carbidopa-levodopa 25-100 MG tablet Commonly known as: SINEMET IR TAKE 1 TABLET BY MOUTH FOUR TIMES DAILY What changed:   when to take this  additional instructions   clopidogrel 75 MG tablet Commonly known as: PLAVIX Take 1 tablet (75 mg total) by mouth daily.   lisinopril 10 MG tablet Commonly known as: ZESTRIL TAKE 1 TABLET(10 MG) BY MOUTH DAILY What changed: See the new instructions.   rosuvastatin 20 MG tablet Commonly known as: CRESTOR TAKE 1 TABLET(20 MG) BY MOUTH AT BEDTIME What changed:   how much to take  how to take this  when to take this  additional instructions            Durable Medical Equipment  (From  admission, onward)         Start     Ordered   03/07/20 0730  For home use only DME 3 n 1  Once        03/07/20 0729   03/07/20 0729  For home use only DME Walker rolling  Once  Comments: 5 wheel  Question Answer Comment  Walker: With [place]   Patient needs a walker to treat with the following condition Weakness      03/07/20 0729           Contact information for follow-up providers    [facility] up.   Specialty: Home Health Services Why: Your home health has been set up with [facility]. The agency will call you to set up beginning of service dates. If you have any questions please call number listed above. Contact information: [place] [address] [phone]            Contact information for after-discharge care    Destination    [facility] Preferred SNF .   Service: Skilled Nursing Contact information: [id number] [phone]                  Major procedures and Radiology Reports - PLEASE review detailed and final reports thoroughly  -         DG Chest Port 1 View  Result Date: 03/03/2020 CLINICAL DATA:  Cough COVID EXAM: PORTABLE CHEST 1 VIEW COMPARISON:  08/20/2016 FINDINGS: Mild left retrocardiac opacity. No acute consolidation or pleural effusion. Emphysematous disease. Stable cardiomediastinal silhouette. No pneumothorax. Probable skin fold artifact over the left lower lateral chest. IMPRESSION: Emphysematous disease. Patchy left retrocardiac opacity, atelectasis versus mild infiltrate. Electronically Signed   By: Donavan Foil M.D.   On: 03/03/2020 19:29   VAS [place] LOWER EXTREMITY VENOUS (DVT)  Result Date: 03/05/2020  Lower Venous DVT Study Other Indications: Covid, D-Dimer. Comparison Study: No previous Performing Technologist: Vonzell Schlatter RVT  Examination Guidelines: A complete evaluation includes B-mode imaging, spectral Doppler,  color Doppler, and power Doppler as needed of all accessible portions of each vessel. Bilateral testing is considered an integral part of a complete examination. Limited examinations for reoccurring indications may be performed as noted. The reflux portion of the exam is performed with the patient in reverse Trendelenburg.  +---------+---------------+---------+-----------+----------+--------------+ RIGHT    CompressibilityPhasicitySpontaneityPropertiesThrombus Aging +---------+---------------+---------+-----------+----------+--------------+ CFV      Full           Yes      Yes                                 +---------+---------------+---------+-----------+----------+--------------+ SFJ      Full                                                        +---------+---------------+---------+-----------+----------+--------------+ FV Prox  Full                                                        +---------+---------------+---------+-----------+----------+--------------+ FV Mid   Full                                                        +---------+---------------+---------+-----------+----------+--------------+  FV DistalFull                                                        +---------+---------------+---------+-----------+----------+--------------+ PFV      Full                                                        +---------+---------------+---------+-----------+----------+--------------+ POP      Full           Yes      Yes                                 +---------+---------------+---------+-----------+----------+--------------+ PTV      Full                                                        +---------+---------------+---------+-----------+----------+--------------+ PERO     Full                                                        +---------+---------------+---------+-----------+----------+--------------+    +---------+---------------+---------+-----------+----------+--------------+ LEFT     CompressibilityPhasicitySpontaneityPropertiesThrombus Aging +---------+---------------+---------+-----------+----------+--------------+ CFV      Full           Yes      Yes                                 +---------+---------------+---------+-----------+----------+--------------+ SFJ      Full                                                        +---------+---------------+---------+-----------+----------+--------------+ FV Prox  Full                                                        +---------+---------------+---------+-----------+----------+--------------+ FV Mid   Full                                                        +---------+---------------+---------+-----------+----------+--------------+ FV DistalFull                                                        +---------+---------------+---------+-----------+----------+--------------+  PFV      Full                                                        +---------+---------------+---------+-----------+----------+--------------+ POP      Full           Yes      Yes                                 +---------+---------------+---------+-----------+----------+--------------+ PTV      Full                                                        +---------+---------------+---------+-----------+----------+--------------+ PERO     Full                                                        +---------+---------------+---------+-----------+----------+--------------+     Summary: RIGHT: - There is no evidence of deep vein thrombosis in the lower extremity.  - No cystic structure found in the popliteal fossa.  LEFT: - There is no evidence of deep vein thrombosis in the lower extremity.  - No cystic structure found in the popliteal fossa.  *See table(s) above for measurements and observations. Electronically signed  by Fabienne Brunsharles Fields MD on 03/05/2020 at 12:12:59 PM.    Final     Micro Results     No results found for this or any previous visit (from the past 240 hour(s)).  Today   Subjective    Donald Richardson today has no headache,no chest abdominal pain,no new weakness tingling or numbness, feels much better     Objective   Blood pressure 120/71, pulse 64, temperature 98.1 F (36.7 C), resp. rate 20, SpO2 (!) 89 %.  No intake or output data in the 24 hours ending 03/15/20 0859  Exam  Awake Alert, No new F.N deficits,   [place].AT,PERRAL Supple Neck,No JVD, No cervical lymphadenopathy appriciated.  Symmetrical Chest wall movement, Good air movement bilaterally, CTAB RRR,No Gallops,Rubs or new Murmurs, No Parasternal Heave +ve B.Sounds, Abd Soft, Non tender, No organomegaly appriciated, No rebound -guarding or rigidity. No Cyanosis, Clubbing or edema, No new Rash or bruise   Data Review   CBC w Diff:  Lab Results  Component Value Date   WBC 11.8 (H) 03/15/2020   HGB 12.9 (L) 03/15/2020   HGB 15.3 10/12/2018   HCT 39.1 03/15/2020   HCT 46.0 10/12/2018   PLT 142 (L) 03/15/2020   PLT 128 (L) 10/12/2018   LYMPHOPCT 4 03/06/2020   MONOPCT 5 03/06/2020   EOSPCT 0 03/06/2020   BASOPCT 0 03/06/2020    CMP:  Lab Results  Component Value Date   NA 138 03/15/2020   NA 142 10/12/2018   K 4.3 03/15/2020   CL 105 03/15/2020   CO2 23 03/15/2020   BUN 34 (H) 03/15/2020   BUN 15 10/12/2018   CREATININE 1.63 (H) 03/15/2020  CREATININE 1.47 (H) 08/29/2015   PROT 5.5 (L) 03/06/2020   PROT 6.7 01/26/2018   ALBUMIN 2.7 (L) 03/06/2020   ALBUMIN 3.8 01/26/2018   BILITOT 0.7 03/06/2020   BILITOT 0.4 01/26/2018   ALKPHOS 48 03/06/2020   AST 22 03/06/2020   ALT <5 03/06/2020  .   Total Time in preparing paper work, data evaluation and todays exam - 46 minutes  Lala Lund M.D on 03/15/2020 at 8:59 AM  [facility]

## 2020-03-15 NOTE — Progress Notes (Signed)
Patient discharged to [facility]. PIV removed. No answer from [facility] when report attempted to be called at number provided by case manager. Patient stable at time of discharge.

## 2020-03-16 ENCOUNTER — Encounter: Payer: Self-pay | Admitting: Internal Medicine

## 2020-03-16 ENCOUNTER — Telehealth: Payer: Self-pay

## 2020-03-16 ENCOUNTER — Non-Acute Institutional Stay (SKILLED_NURSING_FACILITY): Payer: Medicare Other | Admitting: Internal Medicine

## 2020-03-16 DIAGNOSIS — I69398 Other sequelae of cerebral infarction: Secondary | ICD-10-CM

## 2020-03-16 DIAGNOSIS — R001 Bradycardia, unspecified: Secondary | ICD-10-CM | POA: Diagnosis not present

## 2020-03-16 DIAGNOSIS — R269 Unspecified abnormalities of gait and mobility: Secondary | ICD-10-CM | POA: Diagnosis not present

## 2020-03-16 DIAGNOSIS — N1831 Chronic kidney disease, stage 3a: Secondary | ICD-10-CM | POA: Diagnosis not present

## 2020-03-16 DIAGNOSIS — U071 COVID-19: Secondary | ICD-10-CM

## 2020-03-16 NOTE — Assessment & Plan Note (Signed)
He realizes he had a COVID-19 infection; he believes this is what has impaired his ability to ambulate in the context of the prior stroke and chronic weakness.  He refers to COVID as "Opid 19", probably because of his profound auditory deficits.

## 2020-03-16 NOTE — Patient Instructions (Signed)
See assessment and plan under each diagnosis in the problem list and acutely for this visit 

## 2020-03-16 NOTE — Assessment & Plan Note (Addendum)
Creatinine peaked at 1.63 with a GFR of 43 during his hospitalization for acute hypoxic respiratory failure associated with COVID-pneumonia. At discharge it was 1.56/45. Monitor BMET as on ACE-I.

## 2020-03-16 NOTE — Telephone Encounter (Signed)
The pt. Showed up on my TOC report I wasn't sure if he needed to f/u here or not. One note I saw didn't have that he needed to f/u here. I saw another note that said to recheck CMP,CBC and chest x-ray with in one week.

## 2020-03-16 NOTE — Telephone Encounter (Signed)
Ok thank you for the update.

## 2020-03-16 NOTE — Progress Notes (Signed)
NURSING HOME LOCATION:  [facility] ROOM NUMBER:   [id number]  CODE STATUS:    PCP:  Denita Lung, [place] [id number]  This is a comprehensive admission note to [facility] performed on this date less than 30 days from date of admission. Included are preadmission medical/surgical history; reconciled medication list; family history; social history and comprehensive review of systems.  Corrections and additions to the records were documented. Comprehensive physical exam was also performed. Additionally a clinical summary was entered for each active diagnosis pertinent to this admission in the Problem List to enhance continuity of care.  HPI: Patient was hospitalized 1/1 - 03/15/2020 brought to the ED by neighbors who noted altered mental status. Imaging documented pneumonia and COVID  testing was positive.  The patient was an extremely poor historian but did admit to some cough and dyspnea. O2 sats were 87% on room air with clinical acute hypoxic respiratory failure. He has received 2 mRNA vaccines, the last in February 2021 but not the booster. He was treated with a short course of steroids and remdesivir. CRP was elevated up to 4.4, attributed to inflammation. He received moderate dose Lovenox; lower extremity Doppler were negative. He was stable on room air and was essentially symptom-free. 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 As the patient lives alone and is profoundly deconditioned; discharged to the SNF for rehab was recommended.  Past medical and surgical history: Includes COPD, essential hypertension, dyslipidemia, Parkinson's disease, CKD stage IIIb, history of PTE, combined systolic/diastolic congestive heart failure, CAD, and diabetes with renal complications. Procedures and surgeries include inguinal hernia repair with mesh and coronary artery stenting.  Social history: Nondrinker; current smoker at 1/4-1/3 pack/day.   He declined nicotine patch.  Family history: Reviewed, noncontributory due to advanced age. His father did have Parkinson's as well.   Review of systems: He gave the date as January 2022.  He stated "I feel fine, just can't walk because of  Opid 19 (not Covid) and my old stroke."  He states his major issue is the tremor "on the right side".  Constitutional: No fever, significant weight change  Eyes: No redness, discharge, pain, vision change ENT/mouth: No nasal congestion, purulent discharge, earache, change in hearing, sore throat  Cardiovascular: No chest pain, palpitations, paroxysmal nocturnal dyspnea, claudication, edema  Respiratory: No cough, sputum production, hemoptysis, DOE, significant snoring, apnea Gastrointestinal: No heartburn, dysphagia, abdominal pain, nausea /vomiting, rectal bleeding, melena, change in bowels Genitourinary: No dysuria, hematuria, pyuria, incontinence, nocturia Musculoskeletal: No joint stiffness, joint swelling, pain Dermatologic: No rash, pruritus, change in appearance of skin Neurologic: No dizziness, headache, syncope, seizures, numbness, tingling Psychiatric: No significant anxiety, depression, insomnia, anorexia Endocrine: No change in hair/skin/nails, excessive thirst, excessive hunger, excessive urination  Hematologic/lymphatic: No significant bruising, lymphadenopathy, abnormal bleeding  Physical exam:  Pertinent or positive findings: He appears chronically ill and malnourished.  Pattern alopecia is present.  He has a close cropped beard and mustache.  He is markedly hard of hearing.  He is barrel chested and heart sounds are heard only in the epigastrium.  He has bilateral expiratory rhonchi and an intermittent rough nonproductive cough.  Pedal pulses are surprisingly strong.  He has extensive bruising over the forearms and bland scars over the shins.  He is weaker to opposition in the right upper and lower extremities compared to the left.He exhibits  a resting classic parkinsonian tremor of the right hand.  General appearance:  no acute distress,  increased work of breathing is present.   Lymphatic: No lymphadenopathy about the head, neck, axilla. Eyes: No conjunctival inflammation or lid edema is present. There is no scleral icterus. Ears:  External ear exam shows no significant lesions or deformities.   Nose:  External nasal examination shows no deformity or inflammation. Nasal mucosa are pink and moist without lesions, exudates Oral exam: Lips and gums are healthy appearing.There is no oropharyngeal erythema or exudate. Neck:  No thyromegaly, masses, tenderness noted.    Heart:  Normal rate and regular rhythm. S1 and S2 normal without gallop, murmur, click, rub.  Lungs:  without wheezes, rales, rubs. Abdomen: Bowel sounds are normal.  Abdomen is soft and nontender with no organomegaly, hernias, masses. GU: Deferred  Extremities:  No cyanosis, edema. Neurologic exam: Balance, Rhomberg, finger to nose testing could not be completed due to clinical state Skin: Warm & dry w/o tenting. No significant rash.  See clinical summary under each active problem in the Problem List with associated updated therapeutic plan

## 2020-03-16 NOTE — Assessment & Plan Note (Signed)
Heart rate is normal on exam today.

## 2020-03-16 NOTE — Telephone Encounter (Signed)
He is in a nursing home right now

## 2020-03-17 NOTE — Assessment & Plan Note (Signed)
Residua of prior CVA apparently exacerbated by his active COVID infection. PT/OT at SNF.

## 2020-03-22 LAB — BASIC METABOLIC PANEL
BUN: 28 — AB (ref 4–21)
CO2: 25 — AB (ref 13–22)
Chloride: 108 (ref 99–108)
Creatinine: 1.3 (ref 0.6–1.3)
Glucose: 98
Potassium: 4.7 (ref 3.4–5.3)
Sodium: 141 (ref 137–147)

## 2020-03-22 LAB — CBC AND DIFFERENTIAL
HCT: 37 — AB (ref 41–53)
Hemoglobin: 12.6 — AB (ref 13.5–17.5)
Neutrophils Absolute: 6
Platelets: 142 — AB (ref 150–399)
WBC: 7.7

## 2020-03-22 LAB — COMPREHENSIVE METABOLIC PANEL
Calcium: 8.3 — AB (ref 8.7–10.7)
GFR calc Af Amer: 61.02
GFR calc non Af Amer: 52.65

## 2020-03-22 LAB — CBC: RBC: 4.12 (ref 3.87–5.11)

## 2020-03-29 ENCOUNTER — Encounter: Payer: Self-pay | Admitting: Adult Health

## 2020-03-29 ENCOUNTER — Non-Acute Institutional Stay (SKILLED_NURSING_FACILITY): Payer: Medicare Other | Admitting: Adult Health

## 2020-03-29 DIAGNOSIS — I129 Hypertensive chronic kidney disease with stage 1 through stage 4 chronic kidney disease, or unspecified chronic kidney disease: Secondary | ICD-10-CM | POA: Diagnosis not present

## 2020-03-29 DIAGNOSIS — Z8673 Personal history of transient ischemic attack (TIA), and cerebral infarction without residual deficits: Secondary | ICD-10-CM

## 2020-03-29 DIAGNOSIS — U071 COVID-19: Secondary | ICD-10-CM | POA: Diagnosis not present

## 2020-03-29 DIAGNOSIS — G20 Parkinson's disease: Secondary | ICD-10-CM | POA: Diagnosis not present

## 2020-03-29 DIAGNOSIS — N183 Chronic kidney disease, stage 3 unspecified: Secondary | ICD-10-CM

## 2020-03-29 DIAGNOSIS — L853 Xerosis cutis: Secondary | ICD-10-CM

## 2020-03-29 NOTE — Progress Notes (Addendum)
Location:  [place] Room Number: 113-A Place of Service:  SNF (31) Provider:  Durenda Age, DNP, FNP-BC  Patient Care Team: Denita Lung, MD as PCP - General [facility] Medicine)  Extended Emergency Contact Information Primary Emergency Contact: [facility] Address: [place], [place] [address] Johnnette Litter of [place] Phone: [phone] Mobile Phone: [phone] Relation: Brother  Code Status:  FULL CODE  Goals of care: Advanced Directive information Advanced Directives 03/03/2020  Does Patient Have a Medical Advance Directive? Yes  Type of Advance Directive [place]  Does patient want to make changes to medical advance directive? No - Patient declined  Copy of [place] in Chart? Yes - validated most recent copy scanned in chart (See row information)  Would patient like information on creating a medical advance directive? -     Chief Complaint  Patient presents with  . Acute Visit    Patient is seen for a routine short-term rehabilitation visit    HPI:  Pt is a 80 y.o. male seen today for a routine short-term rehabilitation visit.  He is a short-term rehabilitation resident of [facility].  He has a PMH of COPD, essential hypertension, dyslipidemia, Parkinson's disease, chronic kidney disease stage IIIb, combined systolic/diastolic CHF, CAD and diabetes with renal complications. SBPs ranging from 111 to 145.  He takes lisinopril 10 mg daily for hypertension. He has tremors on bilateral hands.  He takes carbidopa-levodopa 25-100 mg 1 tab 4 times daily for Parkinson's disease.  He was admitted to [place] on 03/15/2020 post [facility] hospitalization 03/03/2020 to 03/15/20 COVID-19 viral pneumonitis he is fully vaccinated, 2/2, but has no booster shot. He was treated with short course of steroids and remdesivir.     Past Medical  History:  Diagnosis Date  . ASHD (arteriosclerotic heart disease)    STENT  . COPD (chronic obstructive pulmonary disease) ([place])   . Diabetes mellitus    Hgb A1C- 5.8 in May 2017  . Hemorrhoids   . Hyperlipidemia   . Hypertension   . Smoker    Past Surgical History:  Procedure Laterality Date  . Coronary artery stenting    . INGUINAL HERNIA REPAIR Left 10/26/2015   Procedure: LEFT INGUINAL HERNIA REPAIR WITH MESH;  Surgeon: Jackolyn Confer, MD;  Location: [facility];  Service: General;  Laterality: Left;  . INSERTION OF MESH Left 10/26/2015   Procedure: INSERTION OF MESH;  Surgeon: Jackolyn Confer, MD;  Location: [facility];  Service: General;  Laterality: Left;    No Known Allergies  Outpatient Encounter Medications as of 03/29/2020  Medication Sig  . albuterol (VENTOLIN HFA) 108 (90 Base) MCG/ACT inhaler Inhale 2 puffs into the lungs every 6 (six) hours as needed for wheezing or shortness of breath.  Marland Kitchen aspirin EC 81 MG tablet Take 81 mg by mouth daily. Swallow whole.  . carbidopa-levodopa (SINEMET IR) 25-100 MG tablet TAKE 1 TABLET BY MOUTH FOUR TIMES DAILY  . clopidogrel (PLAVIX) 75 MG tablet Take 1 tablet (75 mg total) by mouth daily.  Marland Kitchen lisinopril (ZESTRIL) 10 MG tablet TAKE 1 TABLET(10 MG) BY MOUTH DAILY (Patient taking differently: Take 10 mg by mouth daily.)  . rosuvastatin (CRESTOR) 20 MG tablet TAKE 1 TABLET(20 MG) BY MOUTH AT BEDTIME (Patient taking differently: Take 20 mg by mouth at bedtime.)   No facility-administered encounter medications on file as of 03/29/2020.  Review of Systems  GENERAL: No change in appetite, no fatigue, no weight changes, no fever, chills or weakness MOUTH and THROAT: Denies oral discomfort, gingival pain or bleeding RESPIRATORY: no cough, SOB, DOE, wheezing, hemoptysis CARDIAC: No chest pain, edema or palpitations GI: No abdominal pain, diarrhea, constipation, heart burn, nausea or vomiting GU: Denies dysuria, frequency, hematuria or  discharge NEUROLOGICAL: Denies dizziness, syncope, numbness, or headache PSYCHIATRIC: Denies feelings of depression or anxiety. No report of hallucinations, insomnia, paranoia, or agitation    Immunization History  Administered Date(s) Administered  . Influenza Split 02/17/2011  . Influenza Whole 12/13/2008  . Influenza, High Dose Seasonal PF 01/09/2017  . PFIZER(Purple Top)SARS-COV-2 Vaccination 04/11/2019, 05/07/2019  . Pneumococcal Conjugate-13 01/09/2017  . Pneumococcal Polysaccharide-23 04/10/2008, 10/17/2013  . Tdap 06/03/2007  . Zoster 06/19/2010   Pertinent  Health Maintenance Due  Topic Date Due  . FOOT EXAM  06/26/2015  . OPHTHALMOLOGY EXAM  08/16/2016  . HEMOGLOBIN A1C  07/27/2018  . INFLUENZA VACCINE  10/02/2019  . PNA vac Low Risk Adult  Completed   Fall Risk  03/16/2019 12/30/2017 09/19/2016 07/18/2016 09/18/2015  Falls in the past year? 0 No Yes Yes No  Comment - - - Last Fall was Stroke April 2018 -  Number falls in past yr: - - 2 or more 2 or more -  Injury with Fall? - - Yes Yes -  Risk Factor Category  - - High Fall Risk - -  Risk for fall due to : - - Other (Comment) - -  Risk for fall due to: Comment - - from CVA - -     Vitals:   03/29/20 1205  BP: 115/65  Pulse: 65  Resp: 17  Temp: 97.7 F (36.5 C)  TempSrc: Oral  Weight: 118 lb 6.4 oz (53.7 kg)  Height: 5\' 6"  (1.676 m)   Body mass index is 19.11 kg/m.  Physical Exam  GENERAL APPEARANCE:  In no acute distress.  SKIN:  Dry skin MOUTH and THROAT: Lips are without lesions. Oral mucosa is moist and without lesions.  RESPIRATORY: Breathing is even & unlabored, BS CTAB CARDIAC: RRR, no murmur,no extra heart sounds, no edema GI: Abdomen soft, normal BS, no masses, no tenderness EXTREMITIES:  Able to move X 4 extremities NEUROLOGICAL: There is no tremor. Speech is clear. Alert and oriented X 3. PSYCHIATRIC:  Affect and behavior are appropriate  Labs reviewed: Recent Labs    03/04/20 0319  03/05/20 0614 03/06/20 0448 03/10/20 0318 03/15/20 0449  NA 138 137 133*  --  138  K 4.1 4.3 4.4  --  4.3  CL 106 104 103  --  105  CO2 23 24 25   --  23  GLUCOSE 153* 147* 156*  --  110*  BUN 19 30* 33*  --  34*  CREATININE 1.37* 1.56* 1.26* 1.38* 1.63*  CALCIUM 8.5* 8.4* 7.9*  --  8.3*  MG 1.8 1.7 1.8  --   --   PHOS 3.0  --   --   --   --    Recent Labs    03/04/20 0319 03/05/20 0614 03/06/20 0448  AST 16 21 22   ALT <5 8 <5  ALKPHOS 62 56 48  BILITOT 0.5 0.8 0.7  PROT 6.2* 5.8* 5.5*  ALBUMIN 2.9* 2.8* 2.7*   Recent Labs    03/04/20 0319 03/05/20 0614 03/06/20 0448 03/15/20 0449  WBC 8.0 7.9 6.6 11.8*  NEUTROABS 7.4 6.9 6.0  --   HGB 13.4  13.5 12.9* 12.9*  HCT 41.4 39.5 37.3* 39.1  MCV 93.2 91.4 89.9 91.6  PLT 95* 98* 106* 142*   Lab Results  Component Value Date   TSH 0.345 (L) 03/04/2020   Lab Results  Component Value Date   HGBA1C 5.8 (H) 01/26/2018   Lab Results  Component Value Date   CHOL 109 10/12/2018   HDL 38 (L) 10/12/2018   LDLCALC 49 10/12/2018   LDLDIRECT 99.1 09/18/2008   TRIG 83 03/03/2020   CHOLHDL 2.9 10/12/2018    Significant Diagnostic Results in last 30 days:  DG Chest Port 1 View  Result Date: 03/03/2020 CLINICAL DATA:  Cough COVID EXAM: PORTABLE CHEST 1 VIEW COMPARISON:  08/20/2016 FINDINGS: Mild left retrocardiac opacity. No acute consolidation or pleural effusion. Emphysematous disease. Stable cardiomediastinal silhouette. No pneumothorax. Probable skin fold artifact over the left lower lateral chest. IMPRESSION: Emphysematous disease. Patchy left retrocardiac opacity, atelectasis versus mild infiltrate. Electronically Signed   By: Donavan Foil M.D.   On: 03/03/2020 19:29   VAS [place] LOWER EXTREMITY VENOUS (DVT)  Result Date: 03/05/2020  Lower Venous DVT Study Other Indications: Covid, D-Dimer. Comparison Study: No previous Performing Technologist: Vonzell Schlatter RVT  Examination Guidelines: A complete evaluation includes B-mode  imaging, spectral Doppler, color Doppler, and power Doppler as needed of all accessible portions of each vessel. Bilateral testing is considered an integral part of a complete examination. Limited examinations for reoccurring indications may be performed as noted. The reflux portion of the exam is performed with the patient in reverse Trendelenburg.  +---------+---------------+---------+-----------+----------+--------------+ RIGHT    CompressibilityPhasicitySpontaneityPropertiesThrombus Aging +---------+---------------+---------+-----------+----------+--------------+ CFV      Full           Yes      Yes                                 +---------+---------------+---------+-----------+----------+--------------+ SFJ      Full                                                        +---------+---------------+---------+-----------+----------+--------------+ FV Prox  Full                                                        +---------+---------------+---------+-----------+----------+--------------+ FV Mid   Full                                                        +---------+---------------+---------+-----------+----------+--------------+ FV DistalFull                                                        +---------+---------------+---------+-----------+----------+--------------+ PFV      Full                                                        +---------+---------------+---------+-----------+----------+--------------+  POP      Full           Yes      Yes                                 +---------+---------------+---------+-----------+----------+--------------+ PTV      Full                                                        +---------+---------------+---------+-----------+----------+--------------+ PERO     Full                                                        +---------+---------------+---------+-----------+----------+--------------+    +---------+---------------+---------+-----------+----------+--------------+ LEFT     CompressibilityPhasicitySpontaneityPropertiesThrombus Aging +---------+---------------+---------+-----------+----------+--------------+ CFV      Full           Yes      Yes                                 +---------+---------------+---------+-----------+----------+--------------+ SFJ      Full                                                        +---------+---------------+---------+-----------+----------+--------------+ FV Prox  Full                                                        +---------+---------------+---------+-----------+----------+--------------+ FV Mid   Full                                                        +---------+---------------+---------+-----------+----------+--------------+ FV DistalFull                                                        +---------+---------------+---------+-----------+----------+--------------+ PFV      Full                                                        +---------+---------------+---------+-----------+----------+--------------+ POP      Full           Yes      Yes                                 +---------+---------------+---------+-----------+----------+--------------+  PTV      Full                                                        +---------+---------------+---------+-----------+----------+--------------+ PERO     Full                                                        +---------+---------------+---------+-----------+----------+--------------+     Summary: RIGHT: - There is no evidence of deep vein thrombosis in the lower extremity.  - No cystic structure found in the popliteal fossa.  LEFT: - There is no evidence of deep vein thrombosis in the lower extremity.  - No cystic structure found in the popliteal fossa.  *See table(s) above for measurements and observations. Electronically signed  by Ruta Hinds MD on 03/05/2020 at 12:12:59 PM.    Final     Assessment/Plan  1. History of CVA (cerebrovascular accident) - Stable, continue clopidogrel and rosuvastatin  2. Primary parkinsonism ([place]) -Stable, continue carbidopa-levodopa  3. Benign hypertension with chronic kidney disease, stage III (HCC) -   BPs stable, continue lisinopril  4.  COVID-19 -   2/2 COVID-19 vaccine, treated with short course of steroids and remdesivir in the hospital -  On room air now  5.  Dry skin --   Apply Cetaphil cream to face and extremities daily -   Monitor skin for infection, no SOB     Family/ staff Communication: Discussed plan of care with resident and charge nurse.  Labs/tests ordered: TSH  Goals of care:   Short-term care   Durenda Age, DNP, MSN, FNP-BC [facility] [phone] (Monday-Friday 8:00 a.m. - 5:00 p.m.) [phone] (after hours)

## 2020-04-05 ENCOUNTER — Non-Acute Institutional Stay (SKILLED_NURSING_FACILITY): Payer: Medicare Other | Admitting: Adult Health

## 2020-04-05 ENCOUNTER — Encounter: Payer: Self-pay | Admitting: Adult Health

## 2020-04-05 DIAGNOSIS — I129 Hypertensive chronic kidney disease with stage 1 through stage 4 chronic kidney disease, or unspecified chronic kidney disease: Secondary | ICD-10-CM

## 2020-04-05 DIAGNOSIS — Z8673 Personal history of transient ischemic attack (TIA), and cerebral infarction without residual deficits: Secondary | ICD-10-CM

## 2020-04-05 DIAGNOSIS — N183 Chronic kidney disease, stage 3 unspecified: Secondary | ICD-10-CM

## 2020-04-05 DIAGNOSIS — G20 Parkinson's disease: Secondary | ICD-10-CM

## 2020-04-05 DIAGNOSIS — E785 Hyperlipidemia, unspecified: Secondary | ICD-10-CM | POA: Diagnosis not present

## 2020-04-05 NOTE — Progress Notes (Signed)
Location:  [place] Room Number: 113-A Place of Service:  SNF (31) Provider:  Durenda Age, DNP, FNP-BC  Patient Care Team: Denita Lung, MD as PCP - General [facility] Medicine)  Extended Emergency Contact Information Primary Emergency Contact: [facility] Address: [place], [place] [address] Johnnette Litter of [place] Phone: [phone] Mobile Phone: [phone] Relation: Brother  Code Status:  FULL CODE  Goals of care: Advanced Directive information Advanced Directives 03/03/2020  Does Patient Have a Medical Advance Directive? Yes  Type of Advance Directive [place]  Does patient want to make changes to medical advance directive? No - Patient declined  Copy of [place] in Chart? Yes - validated most recent copy scanned in chart (See row information)  Would patient like information on creating a medical advance directive? -     Chief Complaint  Patient presents with  . Acute Visit    Patient is seen for a short-term rehabilitation visit.     HPI:  Pt is a 80 y.o. male seen today for a short-term rehabilitation visit.  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 He was seen in his room sitting on his wheelchair. SBPs ranging from 112 to 145. He takes lisinopril 10 mg daily for hypertension. He is currently having PT and OT for therapeutic exercises. He takes clopidogrel 75 mg daily, aspirin EC 81 mg daily and rosuvastatin 20 mg 1 tab daily for history of CVA.    Past Medical History:  Diagnosis Date  . ASHD (arteriosclerotic heart disease)    STENT  . COPD (chronic obstructive pulmonary disease) ([place])   . Diabetes mellitus    Hgb A1C- 5.8 in May 2017  . Hemorrhoids   . Hyperlipidemia   . Hypertension   . Smoker     Past Surgical History:  Procedure Laterality Date  . Coronary artery stenting    . INGUINAL HERNIA REPAIR Left 10/26/2015   Procedure: LEFT INGUINAL HERNIA REPAIR WITH MESH;  Surgeon: Jackolyn Confer, MD;  Location: [facility];  Service: General;  Laterality: Left;  . INSERTION OF MESH Left 10/26/2015   Procedure: INSERTION OF MESH;  Surgeon: Jackolyn Confer, MD;  Location: [facility];  Service: General;  Laterality: Left;    No Known Allergies  Outpatient Encounter Medications as of 04/05/2020  Medication Sig  . albuterol (VENTOLIN HFA) 108 (90 Base) MCG/ACT inhaler Inhale 2 puffs into the lungs every 6 (six) hours as needed for wheezing or shortness of breath.  Marland Kitchen aspirin EC 81 MG tablet Take 81 mg by mouth daily. Swallow whole.  . carbidopa-levodopa (SINEMET IR) 25-100 MG tablet TAKE 1 TABLET BY MOUTH FOUR TIMES DAILY  . clopidogrel (PLAVIX) 75 MG tablet Take 1 tablet (75 mg total) by mouth daily.  . Glucerna (GLUCERNA) LIQD Take 237 mLs by mouth 2 (two) times daily.  Marland Kitchen lisinopril (ZESTRIL) 10 MG tablet TAKE 1 TABLET(10 MG) BY MOUTH DAILY  . rosuvastatin (CRESTOR) 20 MG tablet TAKE 1 TABLET(20 MG) BY MOUTH AT BEDTIME   No facility-administered encounter medications on file as of 04/05/2020.    Review of Systems  GENERAL: No change in appetite, no fatigue, no weight changes, no fever, chills or weakness MOUTH and THROAT: Denies oral discomfort, gingival pain or bleeding RESPIRATORY: no cough, SOB, DOE,  wheezing, hemoptysis CARDIAC: No chest pain, edema or palpitations GI: No abdominal pain, diarrhea, constipation, heart burn, nausea or vomiting GU: Denies dysuria, frequency, hematuria or discharge NEUROLOGICAL: Denies dizziness, syncope, numbness, or headache PSYCHIATRIC: Denies feelings of depression or anxiety. No report of hallucinations, insomnia, paranoia, or agitation   Immunization History  Administered Date(s) Administered  . Influenza Split 02/17/2011  . Influenza Whole  12/13/2008  . Influenza, High Dose Seasonal PF 01/09/2017  . PFIZER(Purple Top)SARS-COV-2 Vaccination 04/11/2019, 05/07/2019  . Pneumococcal Conjugate-13 01/09/2017  . Pneumococcal Polysaccharide-23 04/10/2008, 10/17/2013  . Tdap 06/03/2007  . Zoster 06/19/2010   Pertinent  Health Maintenance Due  Topic Date Due  . FOOT EXAM  06/26/2015  . OPHTHALMOLOGY EXAM  08/16/2016  . HEMOGLOBIN A1C  07/27/2018  . INFLUENZA VACCINE  10/02/2019  . PNA vac Low Risk Adult  Completed   Fall Risk  03/16/2019 12/30/2017 09/19/2016 07/18/2016 09/18/2015  Falls in the past year? 0 No Yes Yes No  Comment - - - Last Fall was Stroke April 2018 -  Number falls in past yr: - - 2 or more 2 or more -  Injury with Fall? - - Yes Yes -  Risk Factor Category  - - High Fall Risk - -  Risk for fall due to : - - Other (Comment) - -  Risk for fall due to: Comment - - from CVA - -     Vitals:   04/05/20 1614  BP: (!) 144/80  Pulse: (!) 57  Resp: 18  Temp: 97.7 F (36.5 C)  TempSrc: Oral  Weight: 123 lb 9.6 oz (56.1 kg)  Height: 5\' 6"  (1.676 m)   Body mass index is 19.95 kg/m.  Physical Exam  GENERAL APPEARANCE:  In no acute distress.  SKIN:  Skin is warm and dry.  MOUTH and THROAT: Lips are without lesions. Oral mucosa is moist and without lesions.  RESPIRATORY: Breathing is even & unlabored, BS CTAB CARDIAC: RRR, no murmur,no extra heart sounds, no edema GI: Abdomen soft, normal BS, no masses, no tenderness EXTREMITIES: Able to move x4 extremities NEUROLOGICAL:  Speech is clear.  PSYCHIATRIC:  Affect and behavior are appropriate  Labs reviewed: Recent Labs    03/04/20 0319 03/05/20 0614 03/06/20 0448 03/10/20 0318 03/15/20 0449 03/22/20 0000  NA 138 137 133*  --  138 141  K 4.1 4.3 4.4  --  4.3 4.7  CL 106 104 103  --  105 108  CO2 23 24 25   --  23 25*  GLUCOSE 153* 147* 156*  --  110*  --   BUN 19 30* 33*  --  34* 28*  CREATININE 1.37* 1.56* 1.26* 1.38* 1.63* 1.3  CALCIUM 8.5* 8.4*  7.9*  --  8.3* 8.3*  MG 1.8 1.7 1.8  --   --   --   PHOS 3.0  --   --   --   --   --    Recent Labs    03/04/20 0319 03/05/20 0614 03/06/20 0448  AST 16 21 22   ALT <5 8 <5  ALKPHOS 62 56 48  BILITOT 0.5 0.8 0.7  PROT 6.2* 5.8* 5.5*  ALBUMIN 2.9* 2.8* 2.7*   Recent Labs    03/05/20 0614 03/06/20 0448 03/15/20 0449 03/22/20 0000  WBC 7.9 6.6 11.8* 7.7  NEUTROABS 6.9 6.0  --  6.00  HGB 13.5 12.9* 12.9* 12.6*  HCT 39.5 37.3* 39.1 37*  MCV 91.4 89.9 91.6  --   PLT 98*  106* 142* 142*   Lab Results  Component Value Date   TSH 0.345 (L) 03/04/2020   Lab Results  Component Value Date   HGBA1C 5.8 (H) 01/26/2018   Lab Results  Component Value Date   CHOL 109 10/12/2018   HDL 38 (L) 10/12/2018   LDLCALC 49 10/12/2018   LDLDIRECT 99.1 09/18/2008   TRIG 83 03/03/2020   CHOLHDL 2.9 10/12/2018    Assessment/Plan  1. Benign hypertension with chronic kidney disease, stage III (HCC) -  BPs stable, continue lisinopril -   Monitor BPs  2. Parkinson's disease ([place]) -   Continue carbidopa-levodopa -    Fall precautions  3. History of CVA (cerebrovascular accident) -    Stable, continue clopidogrel, aspirin and rosuvastatin  4. Hyperlipidemia, unspecified hyperlipidemia type Lab Results  Component Value Date   CHOL 109 10/12/2018   HDL 38 (L) 10/12/2018   LDLCALC 49 10/12/2018   LDLDIRECT 99.1 09/18/2008   TRIG 83 03/03/2020   CHOLHDL 2.9 10/12/2018   -     Continue rosuvastatin    Family/ staff Communication: Discussed plan of care with resident and charge nurse.  Labs/tests ordered: None  Goals of care:   Short-term care  Durenda Age, DNP, MSN, FNP-BC [facility] [phone] (Monday-Friday 8:00 a.m. - 5:00 p.m.) [phone] (after hours)

## 2020-04-06 LAB — TSH: TSH: 1.26 (ref 0.41–5.90)

## 2020-04-10 NOTE — Progress Notes (Incomplete)
Location:  [place] Room Number: 113-A Place of Service:  SNF (31) Provider:  Durenda Age, DNP, FNP-BC  Patient Care Team: Denita Lung, MD as PCP - General [facility] Medicine)  Extended Emergency Contact Information Primary Emergency Contact: [facility] Address: [place], [place] [address] Johnnette Litter of [place] Phone: [phone] Mobile Phone: [phone] Relation: Brother  Code Status:  FULL CODE  Goals of care: Advanced Directive information Advanced Directives 03/03/2020  Does Patient Have a Medical Advance Directive? Yes  Type of Advance Directive [place]  Does patient want to make changes to medical advance directive? No - Patient declined  Copy of [place] in Chart? Yes - validated most recent copy scanned in chart (See row information)  Would patient like information on creating a medical advance directive? -     Chief Complaint  Patient presents with  . Acute Visit    Patient is seen for a short-term rehabilitation visit.     HPI:  Pt is a 80 y.o. Richardson seen today for a short-term rehabilitation visit.  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 He was seen in his room sitting on his wheelchair. SBPs ranging from 112 to 145. He takes lisinopril 10 mg daily for hypertension. He is currently having PT and OT for therapeutic exercises. He takes clopidogrel 75 mg daily, aspirin EC 81 mg daily and rosuvastatin 20 mg 1 tab daily for history of CVA.    Past Medical History:  Diagnosis Date  . ASHD (arteriosclerotic heart disease)    STENT  . COPD (chronic obstructive pulmonary disease) ([place])   . Diabetes mellitus    Hgb A1C- 5.8 in May 2017  . Hemorrhoids   . Hyperlipidemia   . Hypertension   . Smoker     Past Surgical History:  Procedure Laterality Date  . Coronary artery stenting    . INGUINAL HERNIA REPAIR Left 10/26/2015   Procedure: LEFT INGUINAL HERNIA REPAIR WITH MESH;  Surgeon: Jackolyn Confer, MD;  [facility];  Service: General;  Laterality: Left;  . INSERTION OF MESH Left 10/26/2015   Procedure: INSERTION OF MESH;  Surgeon: Jackolyn Confer, MD;  [facility];  Service: General;  Laterality: Left;    No Known Allergies  Outpatient Encounter Medications as of 04/05/2020  Medication Sig  . albuterol (VENTOLIN HFA) 108 (90 Base) MCG/ACT inhaler Inhale 2 puffs into the lungs every 6 (six) hours as needed for wheezing or shortness of breath.  Marland Kitchen aspirin EC 81 MG tablet Take 81 mg by mouth daily. Swallow whole.  . carbidopa-levodopa (SINEMET IR) 25-100 MG tablet TAKE 1 TABLET BY MOUTH FOUR TIMES DAILY  . clopidogrel (PLAVIX) 75 MG tablet Take 1 tablet (75 mg total) by mouth daily.  . Glucerna (GLUCERNA) LIQD Take 237 mLs by mouth 2 (two) times daily.  Marland Kitchen lisinopril (ZESTRIL) 10 MG tablet TAKE 1 TABLET(10 MG) BY MOUTH DAILY  . rosuvastatin (CRESTOR) 20 MG tablet TAKE 1 TABLET(20 MG) BY MOUTH AT BEDTIME   No facility-administered encounter medications on file as of 04/05/2020.    Review of Systems  GENERAL: No change in appetite, no fatigue, no weight changes, no fever, chills or weakness MOUTH and THROAT: Denies oral discomfort, gingival pain or bleeding RESPIRATORY: no cough, SOB, DOE,  wheezing, hemoptysis CARDIAC: No chest pain, edema or palpitations GI: No abdominal pain, diarrhea, constipation, heart burn, nausea or vomiting GU: Denies dysuria, frequency, hematuria or discharge NEUROLOGICAL: Denies dizziness, syncope, numbness, or headache PSYCHIATRIC: Denies feelings of depression or anxiety. No report of hallucinations, insomnia, paranoia, or agitation   Immunization History  Administered Date(s) Administered  . Influenza Split 02/17/2011  . Influenza Whole  12/13/2008  . Influenza, High Dose Seasonal PF 01/09/2017  . PFIZER(Purple Top)SARS-COV-2 Vaccination 04/11/2019, 05/07/2019  . Pneumococcal Conjugate-13 01/09/2017  . Pneumococcal Polysaccharide-23 04/10/2008, 10/17/2013  . Tdap 06/03/2007  . Zoster 06/19/2010   Pertinent  Health Maintenance Due  Topic Date Due  . FOOT EXAM  06/26/2015  . OPHTHALMOLOGY EXAM  08/16/2016  . HEMOGLOBIN A1C  07/27/2018  . INFLUENZA VACCINE  10/02/2019  . PNA vac Low Risk Adult  Completed   Fall Risk  03/16/2019 12/30/2017 09/19/2016 07/18/2016 09/18/2015  Falls in the past year? 0 No Yes Yes No  Comment - - - Last Fall was Stroke April 2018 -  Number falls in past yr: - - 2 or more 2 or more -  Injury with Fall? - - Yes Yes -  Risk Factor Category  - - High Fall Risk - -  Risk for fall due to : - - Other (Comment) - -  Risk for fall due to: Comment - - from CVA - -     Vitals:   04/05/20 1614  BP: (!) 144/80  Pulse: (!) 57  Resp: 18  Temp: 97.7 F (36.5 C)  TempSrc: Oral  Weight: 123 lb 9.6 oz (56.1 kg)  Height: 5\' 6"  (1.676 m)   Body mass index is 19.95 kg/m.  Physical Exam  GENERAL APPEARANCE:  In no acute distress.  SKIN:  Skin is warm and dry.  MOUTH and THROAT: Lips are without lesions. Oral mucosa is moist and without lesions.  RESPIRATORY: Breathing is even & unlabored, BS CTAB CARDIAC: RRR, no murmur,no extra heart sounds, no edema GI: Abdomen soft, normal BS, no masses, no tenderness EXTREMITIES: Able to move x4 extremities NEUROLOGICAL:  Speech is clear.  PSYCHIATRIC:  Affect and behavior are appropriate  Labs reviewed: Recent Labs    03/04/20 0319 03/05/20 0614 03/06/20 0448 03/10/20 0318 03/15/20 0449 03/22/20 0000  NA 138 137 133*  --  138 141  K 4.1 4.3 4.4  --  4.3 4.7  CL 106 104 103  --  105 108  CO2 23 Donald 25   --  23 25*  GLUCOSE 153* 147* 156*  --  110*  --   BUN 19 30* 33*  --  34* 28*  CREATININE 1.37* 1.56* 1.26* 1.38* 1.63* 1.3  CALCIUM 8.5* 8.4*  7.9*  --  8.3* 8.3*  MG 1.8 1.7 1.8  --   --   --   PHOS 3.0  --   --   --   --   --    Recent Labs    03/04/20 0319 03/05/20 0614 03/06/20 0448  AST 16 21 22   ALT <5 8 <5  ALKPHOS 62 56 48  BILITOT 0.5 0.8 0.7  PROT 6.2* 5.8* 5.5*  ALBUMIN 2.9* 2.8* 2.7*   Recent Labs    03/05/20 0614 03/06/20 0448 03/15/20 0449 03/22/20 0000  WBC 7.9 6.6 11.8* 7.7  NEUTROABS 6.9 6.0  --  6.00  HGB 13.5 12.9* 12.9* 12.6*  HCT 39.5 37.3* 39.1 37*  MCV 91.4 89.9 91.6  --   PLT 98*  106* 142* 142*   Lab Results  Component Value Date   TSH 0.345 (L) 03/04/2020   Lab Results  Component Value Date   HGBA1C 5.8 (H) 01/26/2018   Lab Results  Component Value Date   CHOL 109 10/12/2018   HDL 38 (L) 10/12/2018   LDLCALC 49 10/12/2018   LDLDIRECT 99.1 09/18/2008   TRIG 83 03/03/2020   CHOLHDL 2.9 10/12/2018    Assessment/Plan  1. Benign hypertension with chronic kidney disease, stage III ([place]) -  2. Parkinson's disease ([place]) ***  3. History of CVA (cerebrovascular accident) ***  4. Hyperlipidemia, unspecified hyperlipidemia type ***    Family/ staff Communication: ***  Labs/tests ordered:  ***  Goals of care:   Short-term rehabilitation.    Durenda Age, DNP, MSN, FNP-BC [facility] and Adult Medicine [phone] (Monday-Friday 8:00 a.m. - 5:00 p.m.) [phone] (after hours)

## 2020-04-12 ENCOUNTER — Encounter: Payer: Self-pay | Admitting: Adult Health

## 2020-04-12 ENCOUNTER — Non-Acute Institutional Stay (SKILLED_NURSING_FACILITY): Payer: Medicare Other | Admitting: Adult Health

## 2020-04-12 DIAGNOSIS — G20 Parkinson's disease: Secondary | ICD-10-CM

## 2020-04-12 DIAGNOSIS — N183 Chronic kidney disease, stage 3 unspecified: Secondary | ICD-10-CM

## 2020-04-12 DIAGNOSIS — Z8673 Personal history of transient ischemic attack (TIA), and cerebral infarction without residual deficits: Secondary | ICD-10-CM | POA: Diagnosis not present

## 2020-04-12 DIAGNOSIS — I129 Hypertensive chronic kidney disease with stage 1 through stage 4 chronic kidney disease, or unspecified chronic kidney disease: Secondary | ICD-10-CM | POA: Diagnosis not present

## 2020-04-12 DIAGNOSIS — R5381 Other malaise: Secondary | ICD-10-CM

## 2020-04-12 NOTE — Progress Notes (Signed)
Location:  [place] Room Number: 113-A Place of Service:  SNF (31) Provider:  Durenda Age, DNP, FNP-BC  Patient Care Team: Denita Lung, MD as PCP - General [facility] Medicine)  Extended Emergency Contact Information Primary Emergency Contact: [facility] Address: [place], [place] [address] Johnnette Litter of [place] Phone: [phone] Mobile Phone: [phone] Relation: Brother  Code Status:  FULL CODE  Goals of care: Advanced Directive information Advanced Directives 03/03/2020  Does Patient Have a Medical Advance Directive? Yes  Type of Advance Directive [place]  Does patient want to make changes to medical advance directive? No - Patient declined  Copy of [place] in Chart? Yes - validated most recent copy scanned in chart (See row information)  Would patient like information on creating a medical advance directive? -     Chief Complaint  Patient presents with  . Acute Visit    Patient is seen for a routine short-term rehabilitation visit    HPI:  Pt is a 80 y.o. male seen today for a routine short-term rehabilitation visit.  He has a PMH of COPD, essential hypertension, dyslipidemia, Parkinson's disease, chronic kidney disease a stage IIIb, combined systolic/diastolic CHF, CAD and diabetes with renal complications. No fall incident. He takes carbidopa-levodopa 25-100 mg tab 4 times daily for Parkinson's disease. Latest GFR 52.65 (03/22/20), improved from 43 (03/15/20). SBPs ranging from 109 to 144. He takes lisinopril 10 mg 1 daily for hypertension.   Past Medical History:  Diagnosis Date  . ASHD (arteriosclerotic heart disease)    STENT  . COPD (chronic obstructive pulmonary disease) ([place])   . Diabetes mellitus    Hgb A1C- 5.8 in May 2017  . Hemorrhoids   . Hyperlipidemia   . Hypertension   . Smoker    Past Surgical History:  Procedure Laterality Date  .  Coronary artery stenting    . INGUINAL HERNIA REPAIR Left 10/26/2015   Procedure: LEFT INGUINAL HERNIA REPAIR WITH MESH;  Surgeon: Jackolyn Confer, MD;  [facility];  Service: General;  Laterality: Left;  . INSERTION OF MESH Left 10/26/2015   Procedure: INSERTION OF MESH;  Surgeon: Jackolyn Confer, MD;  [facility];  Service: General;  Laterality: Left;    No Known Allergies  Outpatient Encounter Medications as of 04/12/2020  Medication Sig  . albuterol (VENTOLIN HFA) 108 (90 Base) MCG/ACT inhaler Inhale 2 puffs into the lungs every 6 (six) hours as needed for wheezing or shortness of breath.  Marland Kitchen aspirin EC 81 MG tablet Take 81 mg by mouth daily. Swallow whole.  . carbidopa-levodopa (SINEMET IR) 25-100 MG tablet TAKE 1 TABLET BY MOUTH FOUR TIMES DAILY  . clopidogrel (PLAVIX) 75 MG tablet Take 1 tablet (75 mg total) by mouth daily.  . Emollient (CETAPHIL) cream Apply 1 application topically 2 (two) times daily. Apply to face and extremities  . Glucerna (GLUCERNA) LIQD Take 237 mLs by mouth 2 (two) times daily.  Marland Kitchen lisinopril (ZESTRIL) 10 MG tablet TAKE 1 TABLET(10 MG) BY MOUTH DAILY  . rosuvastatin (CRESTOR) 20 MG tablet TAKE 1 TABLET(20 MG) BY MOUTH AT BEDTIME   No facility-administered encounter medications on file as of 04/12/2020.    Review of Systems  GENERAL: No change in appetite, no fatigue, no weight changes, no fever, chills or weakness MOUTH and THROAT: Denies oral discomfort, gingival pain or bleeding RESPIRATORY: no cough, SOB, DOE, wheezing, hemoptysis  CARDIAC: No chest pain, edema or palpitations GI: No abdominal pain, diarrhea, constipation, heart burn, nausea or vomiting GU: Denies dysuria, frequency, hematuria, incontinence, or discharge NEUROLOGICAL: Denies dizziness, syncope, numbness, or headache PSYCHIATRIC: Denies feelings of depression or anxiety. No report of hallucinations, insomnia, paranoia, or agitation    Immunization History  Administered Date(s)  Administered  . Influenza Split 02/17/2011  . Influenza Whole 12/13/2008  . Influenza, High Dose Seasonal PF 01/09/2017  . PFIZER(Purple Top)SARS-COV-2 Vaccination 04/11/2019, 05/07/2019  . Pneumococcal Conjugate-13 01/09/2017  . Pneumococcal Polysaccharide-23 04/10/2008, 10/17/2013  . Tdap 06/03/2007  . Zoster 06/19/2010   Pertinent  Health Maintenance Due  Topic Date Due  . FOOT EXAM  06/26/2015  . OPHTHALMOLOGY EXAM  08/16/2016  . HEMOGLOBIN A1C  07/27/2018  . INFLUENZA VACCINE  10/02/2019  . PNA vac Low Risk Adult  Completed   Fall Risk  03/16/2019 12/30/2017 09/19/2016 07/18/2016 09/18/2015  Falls in the past year? 0 No Yes Yes No  Comment - - - Last Fall was Stroke April 2018 -  Number falls in past yr: - - 2 or more 2 or more -  Injury with Fall? - - Yes Yes -  Risk Factor Category  - - High Fall Risk - -  Risk for fall due to : - - Other (Comment) - -  Risk for fall due to: Comment - - from CVA - -     Vitals:   04/12/20 1447  BP: 120/72  Pulse: 61  Resp: 17  Temp: 97.7 F (36.5 C)  TempSrc: Oral  Weight: 129 lb (58.5 kg)  Height: 5\' 6"  (1.676 m)   Body mass index is 20.82 kg/m.  Physical Exam  GENERAL APPEARANCE: Well nourished. In no acute distress. Normal body habitus SKIN:  Skin is warm and dry.  MOUTH and THROAT: Lips are without lesions. Oral mucosa is moist and without lesions. Tongue is normal in shape, size, and color and without lesions RESPIRATORY: Breathing is even & unlabored, BS CTAB CARDIAC: RRR, no murmur,no extra heart sounds, no edema GI: Abdomen soft, normal BS, no masses, no tenderness EXTREMITIES:  Able to move X 4 extremities. NEUROLOGICAL: There is no tremor. Speech is clear. Alert and oriented X 3. PSYCHIATRIC:  Affect and behavior are appropriate  Labs reviewed: Recent Labs    03/04/20 0319 03/05/20 0614 03/06/20 0448 03/10/20 0318 03/15/20 0449 03/22/20 0000  NA 138 137 133*  --  138 141  K 4.1 4.3 4.4  --  4.3 4.7  CL  106 104 103  --  105 108  CO2 23 24 25   --  23 25*  GLUCOSE 153* 147* 156*  --  110*  --   BUN 19 30* 33*  --  34* 28*  CREATININE 1.37* 1.56* 1.26* 1.38* 1.63* 1.3  CALCIUM 8.5* 8.4* 7.9*  --  8.3* 8.3*  MG 1.8 1.7 1.8  --   --   --   PHOS 3.0  --   --   --   --   --    Recent Labs    03/04/20 0319 03/05/20 0614 03/06/20 0448  AST 16 21 22   ALT <5 8 <5  ALKPHOS 62 56 48  BILITOT 0.5 0.8 0.7  PROT 6.2* 5.8* 5.5*  ALBUMIN 2.9* 2.8* 2.7*   Recent Labs    03/05/20 0614 03/06/20 0448 03/15/20 0449 03/22/20 0000  WBC 7.9 6.6 11.8* 7.7  NEUTROABS 6.9 6.0  --  6.00  HGB 13.5 12.9* 12.9*  12.6*  HCT 39.5 37.3* 39.1 37*  MCV 91.4 89.9 91.6  --   PLT 98* 106* 142* 142*   Lab Results  Component Value Date   TSH 0.345 (L) 03/04/2020   Lab Results  Component Value Date   HGBA1C 5.8 (H) 01/26/2018   Lab Results  Component Value Date   CHOL 109 10/12/2018   HDL 38 (L) 10/12/2018   LDLCALC 49 10/12/2018   LDLDIRECT 99.1 09/18/2008   TRIG 83 03/03/2020   CHOLHDL 2.9 10/12/2018    Assessment/Plan  1. Parkinson's disease ([place]) -   Stable, continue carbidopa-levodopa -   Fall precautions  2. Benign hypertension with chronic kidney disease, stage III (HCC) -    BPs stable, continue lisinopril  3. History of CVA (cerebrovascular accident) -   Stable, continue clopidogrel rosuvastatin  4.  Physical deconditioning -    Continue PT and OT, for therapeutic strengthening exercises     Family/ staff Communication: Discussed plan of care with resident and charge nurse.  Labs/tests ordered: None  Goals of care:   Short-term care   Durenda Age, DNP, MSN, FNP-BC [facility] and Adult Medicine [phone] (Monday-Friday 8:00 a.m. - 5:00 p.m.) [phone] (after hours)

## 2020-04-16 ENCOUNTER — Non-Acute Institutional Stay (SKILLED_NURSING_FACILITY): Payer: Medicare Other | Admitting: Adult Health

## 2020-04-16 ENCOUNTER — Encounter: Payer: Self-pay | Admitting: Adult Health

## 2020-04-16 DIAGNOSIS — Z8673 Personal history of transient ischemic attack (TIA), and cerebral infarction without residual deficits: Secondary | ICD-10-CM | POA: Diagnosis not present

## 2020-04-16 DIAGNOSIS — N183 Chronic kidney disease, stage 3 unspecified: Secondary | ICD-10-CM | POA: Diagnosis not present

## 2020-04-16 DIAGNOSIS — I152 Hypertension secondary to endocrine disorders: Secondary | ICD-10-CM

## 2020-04-16 DIAGNOSIS — R5381 Other malaise: Secondary | ICD-10-CM | POA: Diagnosis not present

## 2020-04-16 DIAGNOSIS — U071 COVID-19: Secondary | ICD-10-CM

## 2020-04-16 DIAGNOSIS — L853 Xerosis cutis: Secondary | ICD-10-CM

## 2020-04-16 DIAGNOSIS — I129 Hypertensive chronic kidney disease with stage 1 through stage 4 chronic kidney disease, or unspecified chronic kidney disease: Secondary | ICD-10-CM

## 2020-04-16 DIAGNOSIS — G20 Parkinson's disease: Secondary | ICD-10-CM | POA: Diagnosis not present

## 2020-04-16 NOTE — Progress Notes (Signed)
Location:  [place] Room Number: [place] of Service:  SNF (31) Provider:  Durenda Age, DNP, FNP-BC  Patient Care Team: Denita Lung, MD as PCP - General (Family Medicine)  Extended Emergency Contact Information Primary Emergency Contact: [facility] Address: [place], [place] [address] Johnnette Litter of [place] Phone: [phone] Mobile Phone: [phone] Relation: Brother  Code Status:   Full Code  Goals of care: Advanced Directive information Advanced Directives 04/16/2020  Does Patient Have a Medical Advance Directive? Yes  Type of Advance Directive [place]  Does patient want to make changes to medical advance directive? No - Patient declined  Copy of [place] in Chart? Yes - validated most recent copy scanned in chart (See row information)  Would patient like information on creating a medical advance directive? -     Chief Complaint  Patient presents with  . Discharge Note    Discharge visit    HPI:  Pt is a 80 y.o. Richardson who is for discharge home on 04/17/20 with Home health PT and OT.  He was admitted to [facility] on 03/15/20 post [facility] hospitalization 03/03/2020 to 03/15/2020 for COVID-19 viral pneumonitis.  He is fully vaccinated, 2/2, but has no booster shot.  He was treated with short course of steroids and remdesivir.  He was given the Materna COVID-19 booster at [facility] on 04/12/2020.  Patient was admitted to this facility for short-term rehabilitation after the patient's recent hospitalization.  Patient has completed SNF rehabilitation and therapy has cleared the patient for discharge.   Past Medical History:  Diagnosis Date  . ASHD (arteriosclerotic heart disease)    STENT  . COPD (chronic obstructive pulmonary disease) ([place])   . Diabetes mellitus    Hgb A1C- 5.8 in May 2017  . Hemorrhoids   .  Hyperlipidemia   . Hypertension   . Smoker    Past Surgical History:  Procedure Laterality Date  . Coronary artery stenting    . INGUINAL HERNIA REPAIR Left 10/26/2015   Procedure: LEFT INGUINAL HERNIA REPAIR WITH MESH;  Surgeon: Jackolyn Confer, MD;  [facility];  Service: General;  Laterality: Left;  . INSERTION OF MESH Left 10/26/2015   Procedure: INSERTION OF MESH;  Surgeon: Jackolyn Confer, MD;  [facility];  Service: General;  Laterality: Left;    No Known Allergies  Outpatient Encounter Medications as of 04/16/2020  Medication Sig  . albuterol (VENTOLIN HFA) 108 (90 Base) MCG/ACT inhaler Inhale 2 puffs into the lungs every 6 (six) hours as needed for wheezing or shortness of breath.  Marland Kitchen aspirin EC 81 MG tablet Take 81 mg by mouth daily. Swallow whole.  . carbidopa-levodopa (SINEMET IR) 25-100 MG tablet TAKE 1 TABLET BY MOUTH FOUR TIMES DAILY  . clopidogrel (PLAVIX) 75 MG tablet Take 1 tablet (75 mg total) by mouth daily.  . Emollient (CETAPHIL) cream Apply 1 application topically 2 (two) times daily. Apply to face and extremities  . Glucerna (GLUCERNA) LIQD Take 237 mLs by mouth 2 (two) times daily.  Marland Kitchen lisinopril (ZESTRIL) 10 MG tablet TAKE 1 TABLET(10 MG) BY MOUTH DAILY  . rosuvastatin (CRESTOR) 20 MG tablet TAKE 1 TABLET(20 MG) BY MOUTH AT BEDTIME   No facility-administered encounter medications on file as of 04/16/2020.    Review of Systems  GENERAL: No change in appetite, no fatigue, no weight changes,  no fever or chills MOUTH and THROAT: Denies oral discomfort, gingival pain or bleeding RESPIRATORY: no cough, SOB, DOE, wheezing, hemoptysis CARDIAC: No chest pain, edema or palpitations GI: No abdominal pain, diarrhea, constipation, heart burn, nausea or vomiting GU: Denies dysuria, frequency, hematuria, incontinence, or discharge NEUROLOGICAL: Denies dizziness, syncope, numbness, or headache PSYCHIATRIC: Denies feelings of depression or anxiety. No report of  hallucinations, insomnia, paranoia, or agitation   Immunization History  Administered Date(s) Administered  . Influenza Split 02/17/2011  . Influenza Whole 12/13/2008  . Influenza, High Dose Seasonal PF 01/09/2017  . PFIZER(Purple Top)SARS-COV-2 Vaccination 04/11/2019, 05/07/2019, 04/11/2020  . Pneumococcal Conjugate-13 01/09/2017  . Pneumococcal Polysaccharide-23 04/10/2008, 10/17/2013  . Tdap 06/03/2007  . Zoster 06/19/2010   Pertinent  Health Maintenance Due  Topic Date Due  . FOOT EXAM  06/26/2015  . OPHTHALMOLOGY EXAM  08/16/2016  . HEMOGLOBIN A1C  07/27/2018  . INFLUENZA VACCINE  10/02/2019  . PNA vac Low Risk Adult  Completed   Fall Risk  03/16/2019 12/30/2017 09/19/2016 07/18/2016 09/18/2015  Falls in the past year? 0 No Yes Yes No  Comment - - - Last Fall was Stroke April 2018 -  Number falls in past yr: - - 2 or more 2 or more -  Injury with Fall? - - Yes Yes -  Risk Factor Category  - - High Fall Risk - -  Risk for fall due to : - - Other (Comment) - -  Risk for fall due to: Comment - - from CVA - -     Vitals:   04/16/20 1345  BP: 129/77  Pulse: 86  Resp: 16  Temp: 97.8 F (36.6 C)  Weight: 129 lb (58.5 kg)  Height: 5\' 6"  (1.676 m)   Body mass index is 20.82 kg/m.  Physical Exam  GENERAL APPEARANCE: Well nourished. In no acute distress. Normal body habitus SKIN:  Skin is warm and dry.  MOUTH and THROAT: Lips are without lesions. Oral mucosa is moist and without lesions. Tongue is normal in shape, size, and color and without lesions RESPIRATORY: Breathing is even & unlabored, BS CTAB CARDIAC: RRR, no murmur,no extra heart sounds, no edema GI: Abdomen soft, normal BS, no masses, no tenderness EXTREMITIES:  Able to move X 4 extremities NEUROLOGICAL: Has tremors. Speech is clear. PSYCHIATRIC:  Affect and behavior are appropriate  Labs reviewed: Recent Labs    03/04/20 0319 03/05/20 0614 03/06/20 0448 03/10/20 0318 03/15/20 0449 03/22/20 0000  NA  138 137 133*  --  138 141  K 4.1 4.3 4.4  --  4.3 4.7  CL 106 104 103  --  105 108  CO2 23 24 25   --  23 25*  GLUCOSE 153* 147* 156*  --  110*  --   BUN 19 30* 33*  --  34* 28*  CREATININE 1.37* 1.56* 1.26* 1.38* 1.63* 1.3  CALCIUM 8.5* 8.4* 7.9*  --  8.3* 8.3*  MG 1.8 1.7 1.8  --   --   --   PHOS 3.0  --   --   --   --   --    Recent Labs    03/04/20 0319 03/05/20 0614 03/06/20 0448  AST 16 21 22   ALT <5 8 <5  ALKPHOS 62 56 48  BILITOT 0.5 0.8 0.7  PROT 6.2* 5.8* 5.5*  ALBUMIN 2.9* 2.8* 2.7*   Recent Labs    03/05/20 0614 03/06/20 0448 03/15/20 0449 03/22/20 0000  WBC 7.9 6.6 11.8* 7.7  NEUTROABS  6.9 6.0  --  6.00  HGB 13.5 12.9* 12.9* 12.6*  HCT 39.5 37.3* 39.1 37*  MCV 91.4 89.9 91.6  --   PLT 98* 106* 142* 142*   Lab Results  Component Value Date   TSH 1.26 04/06/2020   Lab Results  Component Value Date   HGBA1C 5.8 (H) 01/26/2018   Lab Results  Component Value Date   CHOL 109 10/12/2018   HDL 38 (L) 10/12/2018   LDLCALC 49 10/12/2018   LDLDIRECT 99.1 09/18/2008   TRIG 83 03/03/2020   CHOLHDL 2.9 10/12/2018     Assessment/Plan  1. COVID-19 -   Had 2/2 COVID-19 vaccine and had Moderna COVID-19 booster on 04/12/2020 - albuterol (VENTOLIN HFA) 108 (90 Base) MCG/ACT inhaler; Inhale 2 puffs into the lungs every 6 (six) hours as needed for wheezing or shortness of breath.  Dispense: 6.7 g; Refill: 0  2. History of CVA (cerebrovascular accident) - aspirin EC 81 MG tablet; Take 1 tablet (81 mg total) by mouth daily. Swallow whole.  Dispense: 30 tablet; Refill: 0 - clopidogrel (PLAVIX) 75 MG tablet; Take 1 tablet (75 mg total) by mouth daily.  Dispense: 90 tablet; Refill: 3 - rosuvastatin (CRESTOR) 20 MG tablet; TAKE 1 TABLET(20 MG) BY MOUTH AT BEDTIME  Dispense: 90 tablet; Refill: 0  3. Parkinson's disease ([place]) - carbidopa-levodopa (SINEMET IR) 25-100 MG tablet; Take 1 tablet by mouth 4 (four) times daily.  Dispense: 360 tablet; Refill: 0  4. Benign  hypertension with chronic kidney disease, stage III (HCC) - lisinopril (ZESTRIL) 10 MG tablet; TAKE 1 TABLET(10 MG) BY MOUTH DAILY  Dispense: 90 tablet; Refill: 0  5. Dry skin - Emollient (CETAPHIL) cream; Apply 1 application topically 2 (two) times daily. Apply to face and extremities  Dispense: 90 g; Refill: 0  6.  Physical Deconditioning  -   For home health PT and OT, for therapeutic strengthening exercises     I have filled out patient's discharge paperwork ande-prescribed medications.  Patient will have home health PT and OT.   DME provided: wheelchair  He suffers from Parkinson's disease which impairs his ability to perform daily activities like toileting, feeding, dressing, groomin, and 18 in the home.  A cane or walker will not resolve issue with performing activities of daily living.  A wheelchair will allow patient to safely perform daily activities.  Patient can safely propel the wheelchair in the home.   Total discharge time: Greater than 30 minutes Greater than 50% was spent in counseling and coordination of care.   Discharge time involved coordination of the discharge process with social worker, nursing staff and therapy department. Medical justification for home health services/DME verified.    Durenda Age, DNP, MSN, FNP-BC [facility] and Adult Medicine [phone] (Monday-Friday 8:00 a.m. - 5:00 p.m.) [phone] (after hours)

## 2020-04-17 ENCOUNTER — Other Ambulatory Visit: Payer: Self-pay | Admitting: Internal Medicine

## 2020-04-18 ENCOUNTER — Telehealth: Payer: Self-pay

## 2020-04-18 MED ORDER — ROSUVASTATIN CALCIUM 20 MG PO TABS: ORAL_TABLET | ORAL | 0 refills | Status: DC

## 2020-04-18 MED ORDER — LISINOPRIL 10 MG PO TABS: ORAL_TABLET | ORAL | 0 refills | Status: AC

## 2020-04-18 MED ORDER — ALBUTEROL SULFATE HFA 108 (90 BASE) MCG/ACT IN AERS: 2.0000 | INHALATION_SPRAY | Freq: Four times a day (QID) | RESPIRATORY_TRACT | 0 refills | Status: DC | PRN

## 2020-04-18 MED ORDER — GLUCERNA PO LIQD: 237.0000 mL | Freq: Two times a day (BID) | ORAL | 0 refills | Status: DC

## 2020-04-18 MED ORDER — CARBIDOPA-LEVODOPA 25-100 MG PO TABS: 1.0000 | ORAL_TABLET | Freq: Four times a day (QID) | ORAL | 0 refills | Status: DC

## 2020-04-18 MED ORDER — CLOPIDOGREL BISULFATE 75 MG PO TABS: 75.0000 mg | ORAL_TABLET | Freq: Every day | ORAL | 3 refills | Status: DC

## 2020-04-18 MED ORDER — ASPIRIN EC 81 MG PO TBEC: 81.0000 mg | DELAYED_RELEASE_TABLET | Freq: Every day | ORAL | 0 refills | Status: DC

## 2020-04-18 MED ORDER — CETAPHIL MOISTURIZING EX CREA: 1.0000 "application " | TOPICAL_CREAM | Freq: Two times a day (BID) | CUTANEOUS | 0 refills | Status: AC

## 2020-04-18 NOTE — Telephone Encounter (Signed)
Donald Richardson at [facility] called stating they are going to start his home health care on 04/23/20 and wanted to know if that was ok with you pt. Wanted to rest a little before starting home health.

## 2020-04-18 NOTE — Telephone Encounter (Signed)
Crystal aware ok to start on 04/23/20.

## 2020-04-18 NOTE — Telephone Encounter (Signed)
ok 

## 2020-04-19 ENCOUNTER — Inpatient Hospital Stay: Payer: Medicare Other | Admitting: Family Medicine

## 2020-04-23 ENCOUNTER — Telehealth: Payer: Self-pay | Admitting: Family Medicine

## 2020-04-23 ENCOUNTER — Telehealth: Payer: Self-pay

## 2020-04-23 DIAGNOSIS — Z7982 Long term (current) use of aspirin: Secondary | ICD-10-CM | POA: Diagnosis not present

## 2020-04-23 DIAGNOSIS — F0280 Dementia in other diseases classified elsewhere without behavioral disturbance: Secondary | ICD-10-CM | POA: Diagnosis not present

## 2020-04-23 DIAGNOSIS — J439 Emphysema, unspecified: Secondary | ICD-10-CM | POA: Diagnosis not present

## 2020-04-23 DIAGNOSIS — B351 Tinea unguium: Secondary | ICD-10-CM | POA: Diagnosis not present

## 2020-04-23 DIAGNOSIS — J9601 Acute respiratory failure with hypoxia: Secondary | ICD-10-CM | POA: Diagnosis not present

## 2020-04-23 DIAGNOSIS — J1282 Pneumonia due to coronavirus disease 2019: Secondary | ICD-10-CM | POA: Diagnosis not present

## 2020-04-23 DIAGNOSIS — I130 Hypertensive heart and chronic kidney disease with heart failure and stage 1 through stage 4 chronic kidney disease, or unspecified chronic kidney disease: Secondary | ICD-10-CM | POA: Diagnosis not present

## 2020-04-23 DIAGNOSIS — Z7902 Long term (current) use of antithrombotics/antiplatelets: Secondary | ICD-10-CM | POA: Diagnosis not present

## 2020-04-23 DIAGNOSIS — E785 Hyperlipidemia, unspecified: Secondary | ICD-10-CM | POA: Diagnosis not present

## 2020-04-23 DIAGNOSIS — R001 Bradycardia, unspecified: Secondary | ICD-10-CM | POA: Diagnosis not present

## 2020-04-23 DIAGNOSIS — N1832 Chronic kidney disease, stage 3b: Secondary | ICD-10-CM | POA: Diagnosis not present

## 2020-04-23 DIAGNOSIS — E1122 Type 2 diabetes mellitus with diabetic chronic kidney disease: Secondary | ICD-10-CM | POA: Diagnosis not present

## 2020-04-23 DIAGNOSIS — I2510 Atherosclerotic heart disease of native coronary artery without angina pectoris: Secondary | ICD-10-CM | POA: Diagnosis not present

## 2020-04-23 DIAGNOSIS — Z955 Presence of coronary angioplasty implant and graft: Secondary | ICD-10-CM | POA: Diagnosis not present

## 2020-04-23 DIAGNOSIS — Z87891 Personal history of nicotine dependence: Secondary | ICD-10-CM | POA: Diagnosis not present

## 2020-04-23 DIAGNOSIS — Z9181 History of falling: Secondary | ICD-10-CM | POA: Diagnosis not present

## 2020-04-23 DIAGNOSIS — U071 COVID-19: Secondary | ICD-10-CM | POA: Diagnosis not present

## 2020-04-23 DIAGNOSIS — I4510 Unspecified right bundle-branch block: Secondary | ICD-10-CM | POA: Diagnosis not present

## 2020-04-23 DIAGNOSIS — Z8673 Personal history of transient ischemic attack (TIA), and cerebral infarction without residual deficits: Secondary | ICD-10-CM | POA: Diagnosis not present

## 2020-04-23 DIAGNOSIS — I5043 Acute on chronic combined systolic (congestive) and diastolic (congestive) heart failure: Secondary | ICD-10-CM | POA: Diagnosis not present

## 2020-04-23 DIAGNOSIS — H9193 Unspecified hearing loss, bilateral: Secondary | ICD-10-CM | POA: Diagnosis not present

## 2020-04-23 DIAGNOSIS — G20 Parkinson's disease: Secondary | ICD-10-CM | POA: Diagnosis not present

## 2020-04-23 NOTE — Telephone Encounter (Signed)
Cindee Salt with [facility] home [facility] called wanting verbal orders for PT twice a week for 4 weeks then once a week for 2 weeks. He can be reached at [phone].

## 2020-04-23 NOTE — Telephone Encounter (Signed)
Received a call from [place] with [facility]. She is advising the family of pt delayed stayed to home care. They received notice from family ok to proceed  and today with be start of care date

## 2020-04-23 NOTE — Telephone Encounter (Signed)
ok 

## 2020-04-24 NOTE — Telephone Encounter (Signed)
Called LM with verbal order.

## 2020-04-30 ENCOUNTER — Encounter: Payer: Self-pay | Admitting: Family Medicine

## 2020-04-30 ENCOUNTER — Ambulatory Visit (INDEPENDENT_AMBULATORY_CARE_PROVIDER_SITE_OTHER): Payer: Medicare Other | Admitting: Family Medicine

## 2020-04-30 ENCOUNTER — Other Ambulatory Visit: Payer: Self-pay

## 2020-04-30 VITALS — BP 124/86 | HR 53 | Ht 66.0 in | Wt 129.4 lb

## 2020-04-30 DIAGNOSIS — I618 Other nontraumatic intracerebral hemorrhage: Secondary | ICD-10-CM

## 2020-04-30 DIAGNOSIS — E7849 Other hyperlipidemia: Secondary | ICD-10-CM

## 2020-04-30 DIAGNOSIS — E119 Type 2 diabetes mellitus without complications: Secondary | ICD-10-CM | POA: Diagnosis not present

## 2020-04-30 DIAGNOSIS — I10 Essential (primary) hypertension: Secondary | ICD-10-CM

## 2020-04-30 DIAGNOSIS — J438 Other emphysema: Secondary | ICD-10-CM

## 2020-04-30 DIAGNOSIS — H9113 Presbycusis, bilateral: Secondary | ICD-10-CM | POA: Diagnosis not present

## 2020-04-30 DIAGNOSIS — Z1159 Encounter for screening for other viral diseases: Secondary | ICD-10-CM

## 2020-04-30 DIAGNOSIS — G20 Parkinson's disease: Secondary | ICD-10-CM | POA: Diagnosis not present

## 2020-04-30 DIAGNOSIS — Z8616 Personal history of COVID-19: Secondary | ICD-10-CM

## 2020-04-30 DIAGNOSIS — R269 Unspecified abnormalities of gait and mobility: Secondary | ICD-10-CM

## 2020-04-30 DIAGNOSIS — N1831 Chronic kidney disease, stage 3a: Secondary | ICD-10-CM | POA: Diagnosis not present

## 2020-04-30 DIAGNOSIS — I69398 Other sequelae of cerebral infarction: Secondary | ICD-10-CM

## 2020-04-30 DIAGNOSIS — I2510 Atherosclerotic heart disease of native coronary artery without angina pectoris: Secondary | ICD-10-CM

## 2020-04-30 DIAGNOSIS — Z9849 Cataract extraction status, unspecified eye: Secondary | ICD-10-CM | POA: Diagnosis not present

## 2020-04-30 NOTE — Progress Notes (Signed)
Donald Richardson is a 80 y.o. male who presents for annual wellness visit and follow-up on chronic medical conditions.  He was diagnosed with COVID on January 1 and admitted to the hospital.  He was there until the 13th and then sent to rehab.  He was then [facility] until February 14.  He is now at home and does have home care 5 days a week for 7 hours/day.  There are 2 days that he is by himself.  So far he seems to be doing well with this.  He does have a cell phone but is not interested in using a med alert.  His brother seems to be his main primary caregiver however he does not live in the city.  He is followed by cardiology as well as neurology.  He presently is on Crestor for his lipids, lisinopril as well as Plavix and carbidopa/levodopa.  Rarely uses Ventolin.  He has no particular concerns or complaints however after discussing his care with his brother, he has had some bowel and bladder issues and now does use depends.  His previous history of diabetes was from 2015 when he had an A1c of 6.7 however since then his A1c is otherwise been in the low 6 range.  His immunizations were reviewed.  He does have evidence of presbycusis and apparently his hearing aids are being refurbished.   Immunizations and Health Maintenance Immunization History  Administered Date(s) Administered  . Influenza Split 02/17/2011  . Influenza Whole 12/13/2008  . Influenza, High Dose Seasonal PF 01/09/2017  . PFIZER(Purple Top)SARS-COV-2 Vaccination 04/11/2019, 05/07/2019, 04/11/2020  . Pneumococcal Conjugate-13 01/09/2017  . Pneumococcal Polysaccharide-23 04/10/2008, 10/17/2013  . Tdap 06/03/2007  . Zoster 06/19/2010   Health Maintenance Due  Topic Date Due  . Hepatitis C Screening  Never done  . FOOT EXAM  06/26/2015  . OPHTHALMOLOGY EXAM  08/16/2016  . TETANUS/TDAP  06/02/2017  . HEMOGLOBIN A1C  07/27/2018  . INFLUENZA VACCINE  10/02/2019    Last colonoscopy:09/16/2007 Last  PSA:01/26/2018 Dentist:n/a Ophtho:Dr. Delman Cheadle  Exercise:patient does not currently exercise.   Other doctors caring for patient include: Dr Harrington Challenger  Dr Lonni Fix.Dr Delman Cheadle  Advanced Directives: Patient has healthcare power of Attorney and Living Will.     Depression screen:  See questionnaire below.     Depression screen [facility] 2/9 04/30/2020 03/16/2019 12/30/2017 09/19/2016 07/18/2016  Decreased Interest 0 0 0 0 0  Down, Depressed, Hopeless 0 0 0 0 0  PHQ - 2 Score 0 0 0 0 0  Altered sleeping - - - - 0  Tired, decreased energy - - - - 0  Change in appetite - - - - 0  Feeling bad or failure about yourself  - - - - 0  Trouble concentrating - - - - 0  Moving slowly or fidgety/restless - - - - 0  Suicidal thoughts - - - - 0  PHQ-9 Score - - - - 0  Difficult doing work/chores - - - - Not difficult at all    Fall Screen: See Questionaire below.   Fall Risk  04/30/2020 03/16/2019 12/30/2017 09/19/2016 07/18/2016  Falls in the past year? 1 0 No Yes Yes  Comment - - - - Last Fall was Stroke April 2018  Number falls in past yr: 1 - - 2 or more 2 or more  Injury with Fall? 0 - - Yes Yes  Risk Factor Category  - - - High Fall Risk -  Risk for fall due to :  Impaired mobility - - Other (Comment) -  Risk for fall due to: Comment - - - from CVA -  Follow up Falls evaluation completed - - - -    ADL screen:  See questionnaire below.  Functional Status Survey: Is the patient deaf or have difficulty hearing?: Yes Does the patient have difficulty seeing, even when wearing glasses/contacts?: No Does the patient have difficulty concentrating, remembering, or making decisions?: Yes Does the patient have difficulty walking or climbing stairs?: Yes Does the patient have difficulty dressing or bathing?: Yes Does the patient have difficulty doing errands alone such as visiting a doctor's office or shopping?: Yes   Review of Systems  Constitutional: -, -unexpected weight change, -anorexia,  -fatigue Allergy: -sneezing, -itching, -congestion Dermatology: denies changing moles, rash, lumps ENT: -runny nose, -ear pain, -sore throat,  Cardiology:  -chest pain, -palpitations, -orthopnea, Respiratory: -cough, -shortness of breath, -dyspnea on exertion, -wheezing,  Gastroenterology: -abdominal pain, -nausea, -vomiting, -diarrhea, -constipation, -dysphagia Hematology: -bleeding or bruising problems Musculoskeletal: -arthralgias, -myalgias, -joint swelling, -back pain, - Ophthalmology: -vision changes,  Urology: -dysuria, -difficulty urinating,  -urinary frequency, -urgency, incontinence Neurology: -, -numbness, , -memory loss, -falls, -dizziness    PHYSICAL EXAM:  BP 124/86   Pulse (!) 53   Ht 5\' 6"  (1.676 m)   Wt 129 lb 6.4 oz (58.7 kg)   SpO2 95%   BMI 20.89 kg/m   General Appearance: Alert, cooperative, no distress, appears stated age Head: Normocephalic, without obvious abnormality, atraumatic Eyes: PERRL, conjunctiva/corneas clear, EOM's intact,  Ears: Normal TM's and external ear canals Nose: Nares normal, mucosa normal, no drainage or sinus   tenderness Throat: Lips, mucosa, and tongue normal; teeth and gums normal Neck: Supple, no lymphadenopathy, thyroid:no enlargement/tenderness/nodules; no carotid bruit or JVD Lungs: Clear to auscultation bilaterally without wheezes, rales or ronchi; respirations unlabored Heart: Regular rate and rhythm, S1 and S2 normal, no murmur, rub or gallop Abdomen: Soft, non-tender, nondistended, normoactive bowel sounds, no masses, no hepatosplenomegaly Pulses: 2+ and symmetric all extremities Skin: Skin color, texture, turgor normal, no rashes or lesions Lymph nodes: Cervical, supraclavicular, and axillary nodes normal Neurologic: CNII-XII intact, normal strength, sensation and gait; reflexes 2+ and symmetric throughout   Psych: Normal mood, affect, hygiene and grooming  ASSESSMENT/PLAN: Type 2 diabetes mellitus in remission ([place]) -  Plan: Lipid panel, Hemoglobin A1c  Primary hypertension - Plan: CBC with Differential/Platelet, Comprehensive metabolic panel  Other emphysema (HCC)  History of cataract extraction, unspecified laterality  Presbycusis of both ears  History of COVID-19  Parkinson's disease (HCC)  Gait disturbance, post-stroke  Coronary artery disease involving native coronary artery of native heart without angina pectoris  Other nontraumatic intracerebral hemorrhage, unspecified laterality (HCC)  Stage 3a chronic kidney disease ([place]) - Plan: CBC with Differential/Platelet, Comprehensive metabolic panel  Need for hepatitis C screening test - Plan: Hepatitis C antibody  Other hyperlipidemia - Plan: Lipid panel  He will continue on his present medication regimen.  Apparently he does not have any that need to be renewed.  He will continue to be followed by cardiology as well as neurology.  His brother is taking good care of him.  We had a long discussion with him concerning potentially going into assisted living and his brother and he seemed to have a good understanding of this.  At the present time he is not interested but recognizes that he might end up being on one due to his deteriorating functional abilities as well as bowel and bladder issues. I also  explained the diagnosis of diabetes in remission since his A1c is in the good spot.  Immunization recommendations discussed.  Colonoscopy recommendations reviewed.   Medicare Attestation I have personally reviewed: The patient's medical and social history Their use of alcohol, tobacco or illicit drugs Their current medications and supplements The patient's functional ability including ADLs,fall risks, home safety risks, cognitive, and hearing and visual impairment Diet and physical activities Evidence for depression or mood disorders  The patient's weight, height, and BMI have been recorded in the chart.  I have made referrals, counseling, and  provided education to the patient based on review of the above and I have provided the patient with a written personalized care plan for preventive services.     Jill Alexanders, MD   04/30/2020

## 2020-04-30 NOTE — Patient Instructions (Addendum)
  Donald Richardson , Thank you for taking time to come for your Medicare Wellness Visit. I appreciate your ongoing commitment to your health goals. Please review the following plan we discussed and let me know if I can assist you in the future.   These are the goals we discussed: Get your hearing aids adjusted This is a list of the screening recommended for you and due dates:  Health Maintenance  Topic Date Due  .  Hepatitis C: One time screening is recommended by Center for Disease Control  (CDC) for  adults born from [birth year] through 1964.   Never done  . Complete foot exam   06/26/2015  . Eye exam for diabetics  08/16/2016  . Tetanus Vaccine  06/02/2017  . Hemoglobin A1C  07/27/2018  . Flu Shot  10/02/2019  . COVID-19 Vaccine  Completed  . Pneumonia vaccines  Completed

## 2020-05-01 DIAGNOSIS — J9601 Acute respiratory failure with hypoxia: Secondary | ICD-10-CM | POA: Diagnosis not present

## 2020-05-01 DIAGNOSIS — J439 Emphysema, unspecified: Secondary | ICD-10-CM | POA: Diagnosis not present

## 2020-05-01 DIAGNOSIS — J1282 Pneumonia due to coronavirus disease 2019: Secondary | ICD-10-CM | POA: Diagnosis not present

## 2020-05-01 DIAGNOSIS — U071 COVID-19: Secondary | ICD-10-CM | POA: Diagnosis not present

## 2020-05-01 DIAGNOSIS — I5043 Acute on chronic combined systolic (congestive) and diastolic (congestive) heart failure: Secondary | ICD-10-CM | POA: Diagnosis not present

## 2020-05-01 DIAGNOSIS — I130 Hypertensive heart and chronic kidney disease with heart failure and stage 1 through stage 4 chronic kidney disease, or unspecified chronic kidney disease: Secondary | ICD-10-CM | POA: Diagnosis not present

## 2020-05-01 LAB — CBC WITH DIFFERENTIAL/PLATELET
Basophils Absolute: 0 10*3/uL (ref 0.0–0.2)
Basos: 1 %
EOS (ABSOLUTE): 0.2 10*3/uL (ref 0.0–0.4)
Eos: 3 %
Hematocrit: 43.4 % (ref 37.5–51.0)
Hemoglobin: 14.4 g/dL (ref 13.0–17.7)
Immature Grans (Abs): 0 10*3/uL (ref 0.0–0.1)
Immature Granulocytes: 0 %
Lymphocytes Absolute: 0.7 10*3/uL (ref 0.7–3.1)
Lymphs: 10 %
MCH: 30.8 pg (ref 26.6–33.0)
MCHC: 33.2 g/dL (ref 31.5–35.7)
MCV: 93 fL (ref 79–97)
Monocytes Absolute: 0.6 10*3/uL (ref 0.1–0.9)
Monocytes: 8 %
Neutrophils Absolute: 5.1 10*3/uL (ref 1.4–7.0)
Neutrophils: 78 %
Platelets: 146 10*3/uL — ABNORMAL LOW (ref 150–450)
RBC: 4.68 x10E6/uL (ref 4.14–5.80)
RDW: 13.5 % (ref 11.6–15.4)
WBC: 6.5 10*3/uL (ref 3.4–10.8)

## 2020-05-01 LAB — COMPREHENSIVE METABOLIC PANEL
ALT: 6 IU/L (ref 0–44)
AST: 16 IU/L (ref 0–40)
Albumin/Globulin Ratio: 1.3 (ref 1.2–2.2)
Albumin: 3.9 g/dL (ref 3.7–4.7)
Alkaline Phosphatase: 96 IU/L (ref 44–121)
BUN/Creatinine Ratio: 11 (ref 10–24)
BUN: 12 mg/dL (ref 8–27)
Bilirubin Total: 0.6 mg/dL (ref 0.0–1.2)
CO2: 23 mmol/L (ref 20–29)
Calcium: 8.8 mg/dL (ref 8.6–10.2)
Chloride: 103 mmol/L (ref 96–106)
Creatinine, Ser: 1.11 mg/dL (ref 0.76–1.27)
Globulin, Total: 3.1 g/dL (ref 1.5–4.5)
Glucose: 97 mg/dL (ref 65–99)
Potassium: 4.4 mmol/L (ref 3.5–5.2)
Sodium: 140 mmol/L (ref 134–144)
Total Protein: 7 g/dL (ref 6.0–8.5)
eGFR: 68 mL/min/{1.73_m2} (ref >59)

## 2020-05-01 LAB — LIPID PANEL
Chol/HDL Ratio: 3 ratio (ref 0.0–5.0)
Cholesterol, Total: 121 mg/dL (ref 100–199)
HDL: 40 mg/dL (ref >39)
LDL Chol Calc (NIH): 61 mg/dL (ref 0–99)
Triglycerides: 108 mg/dL (ref 0–149)
VLDL Cholesterol Cal: 20 mg/dL (ref 5–40)

## 2020-05-01 LAB — HEMOGLOBIN A1C
Est. average glucose Bld gHb Est-mCnc: 126 mg/dL
Hgb A1c MFr Bld: 6 % — ABNORMAL HIGH (ref 4.8–5.6)

## 2020-05-01 LAB — HEPATITIS C ANTIBODY: Hep C Virus Ab: <0.1 s/co ratio (ref 0.0–0.9)

## 2020-05-03 DIAGNOSIS — J439 Emphysema, unspecified: Secondary | ICD-10-CM | POA: Diagnosis not present

## 2020-05-03 DIAGNOSIS — I5043 Acute on chronic combined systolic (congestive) and diastolic (congestive) heart failure: Secondary | ICD-10-CM | POA: Diagnosis not present

## 2020-05-03 DIAGNOSIS — J9601 Acute respiratory failure with hypoxia: Secondary | ICD-10-CM | POA: Diagnosis not present

## 2020-05-03 DIAGNOSIS — I130 Hypertensive heart and chronic kidney disease with heart failure and stage 1 through stage 4 chronic kidney disease, or unspecified chronic kidney disease: Secondary | ICD-10-CM | POA: Diagnosis not present

## 2020-05-03 DIAGNOSIS — J1282 Pneumonia due to coronavirus disease 2019: Secondary | ICD-10-CM | POA: Diagnosis not present

## 2020-05-03 DIAGNOSIS — U071 COVID-19: Secondary | ICD-10-CM | POA: Diagnosis not present

## 2020-05-08 DIAGNOSIS — I130 Hypertensive heart and chronic kidney disease with heart failure and stage 1 through stage 4 chronic kidney disease, or unspecified chronic kidney disease: Secondary | ICD-10-CM | POA: Diagnosis not present

## 2020-05-08 DIAGNOSIS — J9601 Acute respiratory failure with hypoxia: Secondary | ICD-10-CM | POA: Diagnosis not present

## 2020-05-08 DIAGNOSIS — I5043 Acute on chronic combined systolic (congestive) and diastolic (congestive) heart failure: Secondary | ICD-10-CM | POA: Diagnosis not present

## 2020-05-08 DIAGNOSIS — U071 COVID-19: Secondary | ICD-10-CM | POA: Diagnosis not present

## 2020-05-08 DIAGNOSIS — J439 Emphysema, unspecified: Secondary | ICD-10-CM | POA: Diagnosis not present

## 2020-05-08 DIAGNOSIS — J1282 Pneumonia due to coronavirus disease 2019: Secondary | ICD-10-CM | POA: Diagnosis not present

## 2020-05-10 DIAGNOSIS — I5043 Acute on chronic combined systolic (congestive) and diastolic (congestive) heart failure: Secondary | ICD-10-CM | POA: Diagnosis not present

## 2020-05-10 DIAGNOSIS — I130 Hypertensive heart and chronic kidney disease with heart failure and stage 1 through stage 4 chronic kidney disease, or unspecified chronic kidney disease: Secondary | ICD-10-CM | POA: Diagnosis not present

## 2020-05-10 DIAGNOSIS — J1282 Pneumonia due to coronavirus disease 2019: Secondary | ICD-10-CM | POA: Diagnosis not present

## 2020-05-10 DIAGNOSIS — U071 COVID-19: Secondary | ICD-10-CM | POA: Diagnosis not present

## 2020-05-10 DIAGNOSIS — J9601 Acute respiratory failure with hypoxia: Secondary | ICD-10-CM | POA: Diagnosis not present

## 2020-05-10 DIAGNOSIS — J439 Emphysema, unspecified: Secondary | ICD-10-CM | POA: Diagnosis not present

## 2020-05-14 DIAGNOSIS — J439 Emphysema, unspecified: Secondary | ICD-10-CM | POA: Diagnosis not present

## 2020-05-14 DIAGNOSIS — J9601 Acute respiratory failure with hypoxia: Secondary | ICD-10-CM | POA: Diagnosis not present

## 2020-05-14 DIAGNOSIS — U071 COVID-19: Secondary | ICD-10-CM | POA: Diagnosis not present

## 2020-05-14 DIAGNOSIS — J1282 Pneumonia due to coronavirus disease 2019: Secondary | ICD-10-CM | POA: Diagnosis not present

## 2020-05-14 DIAGNOSIS — I5043 Acute on chronic combined systolic (congestive) and diastolic (congestive) heart failure: Secondary | ICD-10-CM | POA: Diagnosis not present

## 2020-05-14 DIAGNOSIS — I130 Hypertensive heart and chronic kidney disease with heart failure and stage 1 through stage 4 chronic kidney disease, or unspecified chronic kidney disease: Secondary | ICD-10-CM | POA: Diagnosis not present

## 2020-05-15 DIAGNOSIS — J9601 Acute respiratory failure with hypoxia: Secondary | ICD-10-CM | POA: Diagnosis not present

## 2020-05-15 DIAGNOSIS — I5043 Acute on chronic combined systolic (congestive) and diastolic (congestive) heart failure: Secondary | ICD-10-CM | POA: Diagnosis not present

## 2020-05-15 DIAGNOSIS — I130 Hypertensive heart and chronic kidney disease with heart failure and stage 1 through stage 4 chronic kidney disease, or unspecified chronic kidney disease: Secondary | ICD-10-CM | POA: Diagnosis not present

## 2020-05-15 DIAGNOSIS — J439 Emphysema, unspecified: Secondary | ICD-10-CM | POA: Diagnosis not present

## 2020-05-15 DIAGNOSIS — J1282 Pneumonia due to coronavirus disease 2019: Secondary | ICD-10-CM | POA: Diagnosis not present

## 2020-05-15 DIAGNOSIS — U071 COVID-19: Secondary | ICD-10-CM | POA: Diagnosis not present

## 2020-05-17 DIAGNOSIS — U071 COVID-19: Secondary | ICD-10-CM | POA: Diagnosis not present

## 2020-05-17 DIAGNOSIS — I5043 Acute on chronic combined systolic (congestive) and diastolic (congestive) heart failure: Secondary | ICD-10-CM | POA: Diagnosis not present

## 2020-05-17 DIAGNOSIS — J9601 Acute respiratory failure with hypoxia: Secondary | ICD-10-CM | POA: Diagnosis not present

## 2020-05-17 DIAGNOSIS — J1282 Pneumonia due to coronavirus disease 2019: Secondary | ICD-10-CM | POA: Diagnosis not present

## 2020-05-17 DIAGNOSIS — J439 Emphysema, unspecified: Secondary | ICD-10-CM | POA: Diagnosis not present

## 2020-05-17 DIAGNOSIS — I130 Hypertensive heart and chronic kidney disease with heart failure and stage 1 through stage 4 chronic kidney disease, or unspecified chronic kidney disease: Secondary | ICD-10-CM | POA: Diagnosis not present

## 2020-05-22 DIAGNOSIS — J1282 Pneumonia due to coronavirus disease 2019: Secondary | ICD-10-CM | POA: Diagnosis not present

## 2020-05-22 DIAGNOSIS — I130 Hypertensive heart and chronic kidney disease with heart failure and stage 1 through stage 4 chronic kidney disease, or unspecified chronic kidney disease: Secondary | ICD-10-CM | POA: Diagnosis not present

## 2020-05-22 DIAGNOSIS — J9601 Acute respiratory failure with hypoxia: Secondary | ICD-10-CM | POA: Diagnosis not present

## 2020-05-22 DIAGNOSIS — J439 Emphysema, unspecified: Secondary | ICD-10-CM | POA: Diagnosis not present

## 2020-05-22 DIAGNOSIS — U071 COVID-19: Secondary | ICD-10-CM | POA: Diagnosis not present

## 2020-05-22 DIAGNOSIS — I5043 Acute on chronic combined systolic (congestive) and diastolic (congestive) heart failure: Secondary | ICD-10-CM | POA: Diagnosis not present

## 2020-05-23 DIAGNOSIS — B351 Tinea unguium: Secondary | ICD-10-CM | POA: Diagnosis not present

## 2020-05-23 DIAGNOSIS — I4510 Unspecified right bundle-branch block: Secondary | ICD-10-CM | POA: Diagnosis not present

## 2020-05-23 DIAGNOSIS — I2510 Atherosclerotic heart disease of native coronary artery without angina pectoris: Secondary | ICD-10-CM | POA: Diagnosis not present

## 2020-05-23 DIAGNOSIS — R001 Bradycardia, unspecified: Secondary | ICD-10-CM | POA: Diagnosis not present

## 2020-05-23 DIAGNOSIS — H9193 Unspecified hearing loss, bilateral: Secondary | ICD-10-CM | POA: Diagnosis not present

## 2020-05-23 DIAGNOSIS — Z955 Presence of coronary angioplasty implant and graft: Secondary | ICD-10-CM | POA: Diagnosis not present

## 2020-05-23 DIAGNOSIS — Z7902 Long term (current) use of antithrombotics/antiplatelets: Secondary | ICD-10-CM | POA: Diagnosis not present

## 2020-05-23 DIAGNOSIS — G20 Parkinson's disease: Secondary | ICD-10-CM | POA: Diagnosis not present

## 2020-05-23 DIAGNOSIS — Z8673 Personal history of transient ischemic attack (TIA), and cerebral infarction without residual deficits: Secondary | ICD-10-CM | POA: Diagnosis not present

## 2020-05-23 DIAGNOSIS — I130 Hypertensive heart and chronic kidney disease with heart failure and stage 1 through stage 4 chronic kidney disease, or unspecified chronic kidney disease: Secondary | ICD-10-CM | POA: Diagnosis not present

## 2020-05-23 DIAGNOSIS — Z7982 Long term (current) use of aspirin: Secondary | ICD-10-CM | POA: Diagnosis not present

## 2020-05-23 DIAGNOSIS — J439 Emphysema, unspecified: Secondary | ICD-10-CM | POA: Diagnosis not present

## 2020-05-23 DIAGNOSIS — Z87891 Personal history of nicotine dependence: Secondary | ICD-10-CM | POA: Diagnosis not present

## 2020-05-23 DIAGNOSIS — E785 Hyperlipidemia, unspecified: Secondary | ICD-10-CM | POA: Diagnosis not present

## 2020-05-23 DIAGNOSIS — N1832 Chronic kidney disease, stage 3b: Secondary | ICD-10-CM | POA: Diagnosis not present

## 2020-05-23 DIAGNOSIS — E1122 Type 2 diabetes mellitus with diabetic chronic kidney disease: Secondary | ICD-10-CM | POA: Diagnosis not present

## 2020-05-23 DIAGNOSIS — J1282 Pneumonia due to coronavirus disease 2019: Secondary | ICD-10-CM | POA: Diagnosis not present

## 2020-05-23 DIAGNOSIS — U071 COVID-19: Secondary | ICD-10-CM | POA: Diagnosis not present

## 2020-05-23 DIAGNOSIS — F0280 Dementia in other diseases classified elsewhere without behavioral disturbance: Secondary | ICD-10-CM | POA: Diagnosis not present

## 2020-05-23 DIAGNOSIS — Z9181 History of falling: Secondary | ICD-10-CM | POA: Diagnosis not present

## 2020-05-23 DIAGNOSIS — I5043 Acute on chronic combined systolic (congestive) and diastolic (congestive) heart failure: Secondary | ICD-10-CM | POA: Diagnosis not present

## 2020-05-23 DIAGNOSIS — J9601 Acute respiratory failure with hypoxia: Secondary | ICD-10-CM | POA: Diagnosis not present

## 2020-05-28 DIAGNOSIS — J9601 Acute respiratory failure with hypoxia: Secondary | ICD-10-CM | POA: Diagnosis not present

## 2020-05-28 DIAGNOSIS — J1282 Pneumonia due to coronavirus disease 2019: Secondary | ICD-10-CM | POA: Diagnosis not present

## 2020-05-28 DIAGNOSIS — U071 COVID-19: Secondary | ICD-10-CM | POA: Diagnosis not present

## 2020-05-28 DIAGNOSIS — J439 Emphysema, unspecified: Secondary | ICD-10-CM | POA: Diagnosis not present

## 2020-05-28 DIAGNOSIS — I5043 Acute on chronic combined systolic (congestive) and diastolic (congestive) heart failure: Secondary | ICD-10-CM | POA: Diagnosis not present

## 2020-05-28 DIAGNOSIS — I130 Hypertensive heart and chronic kidney disease with heart failure and stage 1 through stage 4 chronic kidney disease, or unspecified chronic kidney disease: Secondary | ICD-10-CM | POA: Diagnosis not present

## 2020-05-29 ENCOUNTER — Other Ambulatory Visit: Payer: Medicare Other

## 2020-05-29 ENCOUNTER — Telehealth: Payer: Self-pay | Admitting: Family Medicine

## 2020-05-29 ENCOUNTER — Other Ambulatory Visit: Payer: Self-pay

## 2020-05-29 DIAGNOSIS — Z111 Encounter for screening for respiratory tuberculosis: Secondary | ICD-10-CM

## 2020-05-29 NOTE — Telephone Encounter (Signed)
PT brother [name] brought in [facility] form.  Form completed and returned to [facility].

## 2020-05-30 ENCOUNTER — Telehealth: Payer: Self-pay

## 2020-05-30 NOTE — Telephone Encounter (Signed)
Flor from [facility] called stating that they want to extend his PT for 1 time a week for 3 more weeks. She can be reached at [phone].

## 2020-05-30 NOTE — Telephone Encounter (Signed)
ok 

## 2020-05-31 LAB — QUANTIFERON-TB GOLD PLUS
QuantiFERON Mitogen Value: >10 IU/mL
QuantiFERON Nil Value: 0.02 IU/mL
QuantiFERON TB1 Ag Value: 0.01 IU/mL
QuantiFERON TB2 Ag Value: 0.12 IU/mL
QuantiFERON-TB Gold Plus: NEGATIVE

## 2020-05-31 NOTE — Telephone Encounter (Signed)
Verbal order given  

## 2020-06-05 DIAGNOSIS — I130 Hypertensive heart and chronic kidney disease with heart failure and stage 1 through stage 4 chronic kidney disease, or unspecified chronic kidney disease: Secondary | ICD-10-CM | POA: Diagnosis not present

## 2020-06-05 DIAGNOSIS — I5043 Acute on chronic combined systolic (congestive) and diastolic (congestive) heart failure: Secondary | ICD-10-CM | POA: Diagnosis not present

## 2020-06-05 DIAGNOSIS — J439 Emphysema, unspecified: Secondary | ICD-10-CM | POA: Diagnosis not present

## 2020-06-05 DIAGNOSIS — J1282 Pneumonia due to coronavirus disease 2019: Secondary | ICD-10-CM | POA: Diagnosis not present

## 2020-06-05 DIAGNOSIS — U071 COVID-19: Secondary | ICD-10-CM | POA: Diagnosis not present

## 2020-06-05 DIAGNOSIS — J9601 Acute respiratory failure with hypoxia: Secondary | ICD-10-CM | POA: Diagnosis not present

## 2020-06-06 DIAGNOSIS — J9601 Acute respiratory failure with hypoxia: Secondary | ICD-10-CM | POA: Diagnosis not present

## 2020-06-06 DIAGNOSIS — J1282 Pneumonia due to coronavirus disease 2019: Secondary | ICD-10-CM | POA: Diagnosis not present

## 2020-06-06 DIAGNOSIS — I130 Hypertensive heart and chronic kidney disease with heart failure and stage 1 through stage 4 chronic kidney disease, or unspecified chronic kidney disease: Secondary | ICD-10-CM | POA: Diagnosis not present

## 2020-06-06 DIAGNOSIS — I5043 Acute on chronic combined systolic (congestive) and diastolic (congestive) heart failure: Secondary | ICD-10-CM | POA: Diagnosis not present

## 2020-06-06 DIAGNOSIS — U071 COVID-19: Secondary | ICD-10-CM | POA: Diagnosis not present

## 2020-06-06 DIAGNOSIS — J439 Emphysema, unspecified: Secondary | ICD-10-CM | POA: Diagnosis not present

## 2020-06-12 DIAGNOSIS — J439 Emphysema, unspecified: Secondary | ICD-10-CM | POA: Diagnosis not present

## 2020-06-12 DIAGNOSIS — I130 Hypertensive heart and chronic kidney disease with heart failure and stage 1 through stage 4 chronic kidney disease, or unspecified chronic kidney disease: Secondary | ICD-10-CM | POA: Diagnosis not present

## 2020-06-12 DIAGNOSIS — U071 COVID-19: Secondary | ICD-10-CM | POA: Diagnosis not present

## 2020-06-12 DIAGNOSIS — J1282 Pneumonia due to coronavirus disease 2019: Secondary | ICD-10-CM | POA: Diagnosis not present

## 2020-06-12 DIAGNOSIS — J9601 Acute respiratory failure with hypoxia: Secondary | ICD-10-CM | POA: Diagnosis not present

## 2020-06-12 DIAGNOSIS — I5043 Acute on chronic combined systolic (congestive) and diastolic (congestive) heart failure: Secondary | ICD-10-CM | POA: Diagnosis not present

## 2020-06-19 DIAGNOSIS — I5043 Acute on chronic combined systolic (congestive) and diastolic (congestive) heart failure: Secondary | ICD-10-CM | POA: Diagnosis not present

## 2020-06-19 DIAGNOSIS — U071 COVID-19: Secondary | ICD-10-CM | POA: Diagnosis not present

## 2020-06-19 DIAGNOSIS — J1282 Pneumonia due to coronavirus disease 2019: Secondary | ICD-10-CM | POA: Diagnosis not present

## 2020-06-19 DIAGNOSIS — J439 Emphysema, unspecified: Secondary | ICD-10-CM | POA: Diagnosis not present

## 2020-06-19 DIAGNOSIS — I130 Hypertensive heart and chronic kidney disease with heart failure and stage 1 through stage 4 chronic kidney disease, or unspecified chronic kidney disease: Secondary | ICD-10-CM | POA: Diagnosis not present

## 2020-06-19 DIAGNOSIS — J9601 Acute respiratory failure with hypoxia: Secondary | ICD-10-CM | POA: Diagnosis not present

## 2020-07-11 DIAGNOSIS — H6123 Impacted cerumen, bilateral: Secondary | ICD-10-CM | POA: Diagnosis not present

## 2020-07-21 ENCOUNTER — Other Ambulatory Visit: Payer: Self-pay | Admitting: Adult Health

## 2020-07-21 DIAGNOSIS — G20 Parkinson's disease: Secondary | ICD-10-CM

## 2020-07-21 DIAGNOSIS — H6123 Impacted cerumen, bilateral: Secondary | ICD-10-CM | POA: Diagnosis not present

## 2020-07-25 ENCOUNTER — Ambulatory Visit (INDEPENDENT_AMBULATORY_CARE_PROVIDER_SITE_OTHER): Payer: Medicare Other | Admitting: Otolaryngology

## 2020-08-01 DIAGNOSIS — H6122 Impacted cerumen, left ear: Secondary | ICD-10-CM | POA: Diagnosis not present

## 2020-08-08 ENCOUNTER — Telehealth: Payer: Self-pay | Admitting: Family Medicine

## 2020-08-08 ENCOUNTER — Ambulatory Visit (INDEPENDENT_AMBULATORY_CARE_PROVIDER_SITE_OTHER): Payer: Medicare Other | Admitting: Otolaryngology

## 2020-08-08 NOTE — Telephone Encounter (Signed)
Donald Richardson called for Donald Richardson, he is needing a new FL2 form signed as he was pushed off a week later into the nursing home.  Donald Richardson will bring by on 6/14.  And Donald Richardson will need covid test on 6/13 to get into the nursing home.

## 2020-08-08 NOTE — Telephone Encounter (Signed)
ok 

## 2020-08-13 ENCOUNTER — Other Ambulatory Visit: Payer: Medicare Other

## 2020-08-13 ENCOUNTER — Other Ambulatory Visit: Payer: Self-pay

## 2020-08-13 DIAGNOSIS — Z1152 Encounter for screening for COVID-19: Secondary | ICD-10-CM | POA: Diagnosis not present

## 2020-08-14 ENCOUNTER — Telehealth: Payer: Self-pay | Admitting: Family Medicine

## 2020-08-14 ENCOUNTER — Other Ambulatory Visit: Payer: Self-pay | Admitting: Adult Health

## 2020-08-14 DIAGNOSIS — Z8673 Personal history of transient ischemic attack (TIA), and cerebral infarction without residual deficits: Secondary | ICD-10-CM

## 2020-08-14 LAB — SARS-COV-2, NAA 2 DAY TAT

## 2020-08-14 LAB — NOVEL CORONAVIRUS, NAA: SARS-CoV-2, NAA: NOT DETECTED

## 2020-08-14 NOTE — Telephone Encounter (Signed)
Covid results came back negative.  Called Donald Richardson to let him know as this was needed to have pt put into nursing home tomorrow.  Reached Clinical biochemist.

## 2020-08-20 ENCOUNTER — Telehealth: Payer: Self-pay | Admitting: Family Medicine

## 2020-08-20 ENCOUNTER — Encounter: Payer: Self-pay | Admitting: Family Medicine

## 2020-08-20 NOTE — Telephone Encounter (Signed)
Donald Richardson called and advised a letter needs to be faxed to [facility] advising the Nebulizer and the Nutritional Shake no longer needed.  Nebulizer was needed during covid. Nutritional shake is not covered by insurance and the [facility] will provide [employer] 3 times per day.  Letter completed and faxed to [facility]

## 2020-08-23 ENCOUNTER — Telehealth: Payer: Self-pay

## 2020-08-23 NOTE — Telephone Encounter (Signed)
Donald Richardson called to advise that pt left knee was swelling , inflamed and warm. No injury known and has been applying lotion to the area. Please advise . [place]

## 2020-08-24 ENCOUNTER — Ambulatory Visit (INDEPENDENT_AMBULATORY_CARE_PROVIDER_SITE_OTHER): Payer: Medicare Other | Admitting: Family Medicine

## 2020-08-24 ENCOUNTER — Other Ambulatory Visit: Payer: Self-pay

## 2020-08-24 VITALS — BP 150/82 | HR 47 | Temp 96.8°F | Wt 134.4 lb

## 2020-08-24 DIAGNOSIS — I2510 Atherosclerotic heart disease of native coronary artery without angina pectoris: Secondary | ICD-10-CM

## 2020-08-24 DIAGNOSIS — S80211A Abrasion, right knee, initial encounter: Secondary | ICD-10-CM

## 2020-08-24 NOTE — Progress Notes (Addendum)
   Subjective:    Patient ID: Donald Richardson, male    DOB: 02/22/1941, 80 y.o.   MRN: [id number]  HPI He is here because it was reported that he was having knee pain and swelling.  He states that he had the commode lid hit on his superior knee area three days ago causing a slight abrasion which was interfering with his walking.   Review of Systems     Objective:   Physical Exam Exam of the right knee shows a healing lesion superior to the patella with no warmth, tenderness or effusion in the knee.       Assessment & Plan:  Abrasion of right knee, initial encounter The area seems to be healing nicely so no further intervention needed.

## 2020-09-10 ENCOUNTER — Other Ambulatory Visit: Payer: Self-pay | Admitting: Family Medicine

## 2020-09-10 DIAGNOSIS — G20 Parkinson's disease: Secondary | ICD-10-CM

## 2020-09-10 DIAGNOSIS — Z8673 Personal history of transient ischemic attack (TIA), and cerebral infarction without residual deficits: Secondary | ICD-10-CM

## 2020-09-17 DIAGNOSIS — I259 Chronic ischemic heart disease, unspecified: Secondary | ICD-10-CM | POA: Diagnosis not present

## 2020-09-17 DIAGNOSIS — I639 Cerebral infarction, unspecified: Secondary | ICD-10-CM | POA: Diagnosis not present

## 2020-09-17 DIAGNOSIS — N189 Chronic kidney disease, unspecified: Secondary | ICD-10-CM | POA: Diagnosis not present

## 2020-09-17 DIAGNOSIS — J449 Chronic obstructive pulmonary disease, unspecified: Secondary | ICD-10-CM | POA: Diagnosis not present

## 2020-09-17 DIAGNOSIS — L565 Disseminated superficial actinic porokeratosis (DSAP): Secondary | ICD-10-CM | POA: Diagnosis not present

## 2020-09-17 DIAGNOSIS — G20 Parkinson's disease: Secondary | ICD-10-CM | POA: Diagnosis not present

## 2020-09-17 DIAGNOSIS — I69393 Ataxia following cerebral infarction: Secondary | ICD-10-CM | POA: Diagnosis not present

## 2020-09-17 DIAGNOSIS — E119 Type 2 diabetes mellitus without complications: Secondary | ICD-10-CM | POA: Diagnosis not present

## 2020-09-19 DIAGNOSIS — E785 Hyperlipidemia, unspecified: Secondary | ICD-10-CM | POA: Diagnosis not present

## 2020-09-19 DIAGNOSIS — R946 Abnormal results of thyroid function studies: Secondary | ICD-10-CM | POA: Diagnosis not present

## 2020-09-19 DIAGNOSIS — Z13228 Encounter for screening for other metabolic disorders: Secondary | ICD-10-CM | POA: Diagnosis not present

## 2020-09-19 DIAGNOSIS — R7309 Other abnormal glucose: Secondary | ICD-10-CM | POA: Diagnosis not present

## 2020-09-19 DIAGNOSIS — Z13220 Encounter for screening for lipoid disorders: Secondary | ICD-10-CM | POA: Diagnosis not present

## 2020-09-20 DIAGNOSIS — R2681 Unsteadiness on feet: Secondary | ICD-10-CM | POA: Diagnosis not present

## 2020-09-20 DIAGNOSIS — G20 Parkinson's disease: Secondary | ICD-10-CM | POA: Diagnosis not present

## 2020-09-20 DIAGNOSIS — R279 Unspecified lack of coordination: Secondary | ICD-10-CM | POA: Diagnosis not present

## 2020-09-20 DIAGNOSIS — R262 Difficulty in walking, not elsewhere classified: Secondary | ICD-10-CM | POA: Diagnosis not present

## 2020-09-22 DIAGNOSIS — R262 Difficulty in walking, not elsewhere classified: Secondary | ICD-10-CM | POA: Diagnosis not present

## 2020-09-22 DIAGNOSIS — R279 Unspecified lack of coordination: Secondary | ICD-10-CM | POA: Diagnosis not present

## 2020-09-22 DIAGNOSIS — G20 Parkinson's disease: Secondary | ICD-10-CM | POA: Diagnosis not present

## 2020-09-22 DIAGNOSIS — R2681 Unsteadiness on feet: Secondary | ICD-10-CM | POA: Diagnosis not present

## 2020-09-23 DIAGNOSIS — G20 Parkinson's disease: Secondary | ICD-10-CM | POA: Diagnosis not present

## 2020-09-23 DIAGNOSIS — R262 Difficulty in walking, not elsewhere classified: Secondary | ICD-10-CM | POA: Diagnosis not present

## 2020-09-23 DIAGNOSIS — R2681 Unsteadiness on feet: Secondary | ICD-10-CM | POA: Diagnosis not present

## 2020-09-23 DIAGNOSIS — R279 Unspecified lack of coordination: Secondary | ICD-10-CM | POA: Diagnosis not present

## 2020-09-25 DIAGNOSIS — R2681 Unsteadiness on feet: Secondary | ICD-10-CM | POA: Diagnosis not present

## 2020-09-25 DIAGNOSIS — M6281 Muscle weakness (generalized): Secondary | ICD-10-CM | POA: Diagnosis not present

## 2020-09-30 DIAGNOSIS — R279 Unspecified lack of coordination: Secondary | ICD-10-CM | POA: Diagnosis not present

## 2020-09-30 DIAGNOSIS — R262 Difficulty in walking, not elsewhere classified: Secondary | ICD-10-CM | POA: Diagnosis not present

## 2020-09-30 DIAGNOSIS — R2681 Unsteadiness on feet: Secondary | ICD-10-CM | POA: Diagnosis not present

## 2020-09-30 DIAGNOSIS — G20 Parkinson's disease: Secondary | ICD-10-CM | POA: Diagnosis not present

## 2020-10-01 DIAGNOSIS — M6281 Muscle weakness (generalized): Secondary | ICD-10-CM | POA: Diagnosis not present

## 2020-10-01 DIAGNOSIS — R279 Unspecified lack of coordination: Secondary | ICD-10-CM | POA: Diagnosis not present

## 2020-10-01 DIAGNOSIS — R262 Difficulty in walking, not elsewhere classified: Secondary | ICD-10-CM | POA: Diagnosis not present

## 2020-10-01 DIAGNOSIS — R2681 Unsteadiness on feet: Secondary | ICD-10-CM | POA: Diagnosis not present

## 2020-10-01 DIAGNOSIS — G20 Parkinson's disease: Secondary | ICD-10-CM | POA: Diagnosis not present

## 2020-10-02 DIAGNOSIS — E119 Type 2 diabetes mellitus without complications: Secondary | ICD-10-CM | POA: Diagnosis not present

## 2020-10-02 DIAGNOSIS — G20 Parkinson's disease: Secondary | ICD-10-CM | POA: Diagnosis not present

## 2020-10-02 DIAGNOSIS — R2681 Unsteadiness on feet: Secondary | ICD-10-CM | POA: Diagnosis not present

## 2020-10-02 DIAGNOSIS — L565 Disseminated superficial actinic porokeratosis (DSAP): Secondary | ICD-10-CM | POA: Diagnosis not present

## 2020-10-02 DIAGNOSIS — I69393 Ataxia following cerebral infarction: Secondary | ICD-10-CM | POA: Diagnosis not present

## 2020-10-02 DIAGNOSIS — M6281 Muscle weakness (generalized): Secondary | ICD-10-CM | POA: Diagnosis not present

## 2020-10-03 DIAGNOSIS — Z20828 Contact with and (suspected) exposure to other viral communicable diseases: Secondary | ICD-10-CM | POA: Diagnosis not present

## 2020-10-03 DIAGNOSIS — M6281 Muscle weakness (generalized): Secondary | ICD-10-CM | POA: Diagnosis not present

## 2020-10-03 DIAGNOSIS — R2681 Unsteadiness on feet: Secondary | ICD-10-CM | POA: Diagnosis not present

## 2020-10-05 DIAGNOSIS — R262 Difficulty in walking, not elsewhere classified: Secondary | ICD-10-CM | POA: Diagnosis not present

## 2020-10-05 DIAGNOSIS — R2681 Unsteadiness on feet: Secondary | ICD-10-CM | POA: Diagnosis not present

## 2020-10-05 DIAGNOSIS — R279 Unspecified lack of coordination: Secondary | ICD-10-CM | POA: Diagnosis not present

## 2020-10-05 DIAGNOSIS — G20 Parkinson's disease: Secondary | ICD-10-CM | POA: Diagnosis not present

## 2020-10-07 DIAGNOSIS — R262 Difficulty in walking, not elsewhere classified: Secondary | ICD-10-CM | POA: Diagnosis not present

## 2020-10-07 DIAGNOSIS — R279 Unspecified lack of coordination: Secondary | ICD-10-CM | POA: Diagnosis not present

## 2020-10-07 DIAGNOSIS — G20 Parkinson's disease: Secondary | ICD-10-CM | POA: Diagnosis not present

## 2020-10-07 DIAGNOSIS — R2681 Unsteadiness on feet: Secondary | ICD-10-CM | POA: Diagnosis not present

## 2020-10-08 DIAGNOSIS — R279 Unspecified lack of coordination: Secondary | ICD-10-CM | POA: Diagnosis not present

## 2020-10-08 DIAGNOSIS — M6281 Muscle weakness (generalized): Secondary | ICD-10-CM | POA: Diagnosis not present

## 2020-10-08 DIAGNOSIS — R262 Difficulty in walking, not elsewhere classified: Secondary | ICD-10-CM | POA: Diagnosis not present

## 2020-10-08 DIAGNOSIS — R2681 Unsteadiness on feet: Secondary | ICD-10-CM | POA: Diagnosis not present

## 2020-10-08 DIAGNOSIS — G20 Parkinson's disease: Secondary | ICD-10-CM | POA: Diagnosis not present

## 2020-10-09 DIAGNOSIS — M6281 Muscle weakness (generalized): Secondary | ICD-10-CM | POA: Diagnosis not present

## 2020-10-09 DIAGNOSIS — R2681 Unsteadiness on feet: Secondary | ICD-10-CM | POA: Diagnosis not present

## 2020-10-10 DIAGNOSIS — Z20828 Contact with and (suspected) exposure to other viral communicable diseases: Secondary | ICD-10-CM | POA: Diagnosis not present

## 2020-10-10 DIAGNOSIS — M6281 Muscle weakness (generalized): Secondary | ICD-10-CM | POA: Diagnosis not present

## 2020-10-10 DIAGNOSIS — R2681 Unsteadiness on feet: Secondary | ICD-10-CM | POA: Diagnosis not present

## 2020-10-12 DIAGNOSIS — R279 Unspecified lack of coordination: Secondary | ICD-10-CM | POA: Diagnosis not present

## 2020-10-12 DIAGNOSIS — R262 Difficulty in walking, not elsewhere classified: Secondary | ICD-10-CM | POA: Diagnosis not present

## 2020-10-12 DIAGNOSIS — G20 Parkinson's disease: Secondary | ICD-10-CM | POA: Diagnosis not present

## 2020-10-12 DIAGNOSIS — R2681 Unsteadiness on feet: Secondary | ICD-10-CM | POA: Diagnosis not present

## 2020-10-14 DIAGNOSIS — R2681 Unsteadiness on feet: Secondary | ICD-10-CM | POA: Diagnosis not present

## 2020-10-14 DIAGNOSIS — R262 Difficulty in walking, not elsewhere classified: Secondary | ICD-10-CM | POA: Diagnosis not present

## 2020-10-14 DIAGNOSIS — R279 Unspecified lack of coordination: Secondary | ICD-10-CM | POA: Diagnosis not present

## 2020-10-14 DIAGNOSIS — G20 Parkinson's disease: Secondary | ICD-10-CM | POA: Diagnosis not present

## 2020-10-15 ENCOUNTER — Ambulatory Visit: Payer: Medicare Other | Admitting: Family Medicine

## 2020-10-15 DIAGNOSIS — M6281 Muscle weakness (generalized): Secondary | ICD-10-CM | POA: Diagnosis not present

## 2020-10-15 DIAGNOSIS — G20 Parkinson's disease: Secondary | ICD-10-CM | POA: Diagnosis not present

## 2020-10-15 DIAGNOSIS — R262 Difficulty in walking, not elsewhere classified: Secondary | ICD-10-CM | POA: Diagnosis not present

## 2020-10-15 DIAGNOSIS — R279 Unspecified lack of coordination: Secondary | ICD-10-CM | POA: Diagnosis not present

## 2020-10-15 DIAGNOSIS — R2681 Unsteadiness on feet: Secondary | ICD-10-CM | POA: Diagnosis not present

## 2020-10-16 DIAGNOSIS — M6281 Muscle weakness (generalized): Secondary | ICD-10-CM | POA: Diagnosis not present

## 2020-10-16 DIAGNOSIS — R2681 Unsteadiness on feet: Secondary | ICD-10-CM | POA: Diagnosis not present

## 2020-10-17 DIAGNOSIS — U071 COVID-19: Secondary | ICD-10-CM | POA: Diagnosis not present

## 2020-10-19 DIAGNOSIS — R262 Difficulty in walking, not elsewhere classified: Secondary | ICD-10-CM | POA: Diagnosis not present

## 2020-10-19 DIAGNOSIS — R2681 Unsteadiness on feet: Secondary | ICD-10-CM | POA: Diagnosis not present

## 2020-10-19 DIAGNOSIS — R279 Unspecified lack of coordination: Secondary | ICD-10-CM | POA: Diagnosis not present

## 2020-10-19 DIAGNOSIS — G20 Parkinson's disease: Secondary | ICD-10-CM | POA: Diagnosis not present

## 2020-10-19 DIAGNOSIS — M6281 Muscle weakness (generalized): Secondary | ICD-10-CM | POA: Diagnosis not present

## 2020-10-22 DIAGNOSIS — G20 Parkinson's disease: Secondary | ICD-10-CM | POA: Diagnosis not present

## 2020-10-22 DIAGNOSIS — R262 Difficulty in walking, not elsewhere classified: Secondary | ICD-10-CM | POA: Diagnosis not present

## 2020-10-22 DIAGNOSIS — R279 Unspecified lack of coordination: Secondary | ICD-10-CM | POA: Diagnosis not present

## 2020-10-22 DIAGNOSIS — R2681 Unsteadiness on feet: Secondary | ICD-10-CM | POA: Diagnosis not present

## 2020-10-22 DIAGNOSIS — M6281 Muscle weakness (generalized): Secondary | ICD-10-CM | POA: Diagnosis not present

## 2020-10-23 DIAGNOSIS — M6281 Muscle weakness (generalized): Secondary | ICD-10-CM | POA: Diagnosis not present

## 2020-10-23 DIAGNOSIS — R2681 Unsteadiness on feet: Secondary | ICD-10-CM | POA: Diagnosis not present

## 2020-10-24 DIAGNOSIS — R2681 Unsteadiness on feet: Secondary | ICD-10-CM | POA: Diagnosis not present

## 2020-10-24 DIAGNOSIS — M6281 Muscle weakness (generalized): Secondary | ICD-10-CM | POA: Diagnosis not present

## 2020-10-26 DIAGNOSIS — G20 Parkinson's disease: Secondary | ICD-10-CM | POA: Diagnosis not present

## 2020-10-26 DIAGNOSIS — R2681 Unsteadiness on feet: Secondary | ICD-10-CM | POA: Diagnosis not present

## 2020-10-26 DIAGNOSIS — R279 Unspecified lack of coordination: Secondary | ICD-10-CM | POA: Diagnosis not present

## 2020-10-26 DIAGNOSIS — R262 Difficulty in walking, not elsewhere classified: Secondary | ICD-10-CM | POA: Diagnosis not present

## 2020-10-29 DIAGNOSIS — R2681 Unsteadiness on feet: Secondary | ICD-10-CM | POA: Diagnosis not present

## 2020-10-29 DIAGNOSIS — M6281 Muscle weakness (generalized): Secondary | ICD-10-CM | POA: Diagnosis not present

## 2020-10-30 DIAGNOSIS — R2681 Unsteadiness on feet: Secondary | ICD-10-CM | POA: Diagnosis not present

## 2020-10-30 DIAGNOSIS — R279 Unspecified lack of coordination: Secondary | ICD-10-CM | POA: Diagnosis not present

## 2020-10-30 DIAGNOSIS — R262 Difficulty in walking, not elsewhere classified: Secondary | ICD-10-CM | POA: Diagnosis not present

## 2020-10-30 DIAGNOSIS — G20 Parkinson's disease: Secondary | ICD-10-CM | POA: Diagnosis not present

## 2020-10-30 DIAGNOSIS — M6281 Muscle weakness (generalized): Secondary | ICD-10-CM | POA: Diagnosis not present

## 2020-10-31 DIAGNOSIS — M6281 Muscle weakness (generalized): Secondary | ICD-10-CM | POA: Diagnosis not present

## 2020-10-31 DIAGNOSIS — Z20828 Contact with and (suspected) exposure to other viral communicable diseases: Secondary | ICD-10-CM | POA: Diagnosis not present

## 2020-10-31 DIAGNOSIS — R2681 Unsteadiness on feet: Secondary | ICD-10-CM | POA: Diagnosis not present

## 2020-11-01 DIAGNOSIS — G20 Parkinson's disease: Secondary | ICD-10-CM | POA: Diagnosis not present

## 2020-11-01 DIAGNOSIS — R279 Unspecified lack of coordination: Secondary | ICD-10-CM | POA: Diagnosis not present

## 2020-11-01 DIAGNOSIS — R262 Difficulty in walking, not elsewhere classified: Secondary | ICD-10-CM | POA: Diagnosis not present

## 2020-11-01 DIAGNOSIS — R2681 Unsteadiness on feet: Secondary | ICD-10-CM | POA: Diagnosis not present

## 2020-11-04 DIAGNOSIS — R279 Unspecified lack of coordination: Secondary | ICD-10-CM | POA: Diagnosis not present

## 2020-11-04 DIAGNOSIS — R262 Difficulty in walking, not elsewhere classified: Secondary | ICD-10-CM | POA: Diagnosis not present

## 2020-11-04 DIAGNOSIS — R2681 Unsteadiness on feet: Secondary | ICD-10-CM | POA: Diagnosis not present

## 2020-11-04 DIAGNOSIS — G20 Parkinson's disease: Secondary | ICD-10-CM | POA: Diagnosis not present

## 2020-11-05 DIAGNOSIS — R2681 Unsteadiness on feet: Secondary | ICD-10-CM | POA: Diagnosis not present

## 2020-11-05 DIAGNOSIS — R262 Difficulty in walking, not elsewhere classified: Secondary | ICD-10-CM | POA: Diagnosis not present

## 2020-11-05 DIAGNOSIS — M6281 Muscle weakness (generalized): Secondary | ICD-10-CM | POA: Diagnosis not present

## 2020-11-05 DIAGNOSIS — G20 Parkinson's disease: Secondary | ICD-10-CM | POA: Diagnosis not present

## 2020-11-05 DIAGNOSIS — R279 Unspecified lack of coordination: Secondary | ICD-10-CM | POA: Diagnosis not present

## 2020-11-07 DIAGNOSIS — R2681 Unsteadiness on feet: Secondary | ICD-10-CM | POA: Diagnosis not present

## 2020-11-07 DIAGNOSIS — M6281 Muscle weakness (generalized): Secondary | ICD-10-CM | POA: Diagnosis not present

## 2020-11-08 DIAGNOSIS — R262 Difficulty in walking, not elsewhere classified: Secondary | ICD-10-CM | POA: Diagnosis not present

## 2020-11-08 DIAGNOSIS — G20 Parkinson's disease: Secondary | ICD-10-CM | POA: Diagnosis not present

## 2020-11-08 DIAGNOSIS — R2681 Unsteadiness on feet: Secondary | ICD-10-CM | POA: Diagnosis not present

## 2020-11-08 DIAGNOSIS — R279 Unspecified lack of coordination: Secondary | ICD-10-CM | POA: Diagnosis not present

## 2020-11-09 DIAGNOSIS — M6281 Muscle weakness (generalized): Secondary | ICD-10-CM | POA: Diagnosis not present

## 2020-11-09 DIAGNOSIS — R262 Difficulty in walking, not elsewhere classified: Secondary | ICD-10-CM | POA: Diagnosis not present

## 2020-11-09 DIAGNOSIS — R2681 Unsteadiness on feet: Secondary | ICD-10-CM | POA: Diagnosis not present

## 2020-11-09 DIAGNOSIS — R279 Unspecified lack of coordination: Secondary | ICD-10-CM | POA: Diagnosis not present

## 2020-11-09 DIAGNOSIS — G20 Parkinson's disease: Secondary | ICD-10-CM | POA: Diagnosis not present

## 2020-11-11 DIAGNOSIS — G20 Parkinson's disease: Secondary | ICD-10-CM | POA: Diagnosis not present

## 2020-11-11 DIAGNOSIS — R2681 Unsteadiness on feet: Secondary | ICD-10-CM | POA: Diagnosis not present

## 2020-11-11 DIAGNOSIS — R262 Difficulty in walking, not elsewhere classified: Secondary | ICD-10-CM | POA: Diagnosis not present

## 2020-11-11 DIAGNOSIS — R279 Unspecified lack of coordination: Secondary | ICD-10-CM | POA: Diagnosis not present

## 2020-11-12 DIAGNOSIS — M6281 Muscle weakness (generalized): Secondary | ICD-10-CM | POA: Diagnosis not present

## 2020-11-12 DIAGNOSIS — R2681 Unsteadiness on feet: Secondary | ICD-10-CM | POA: Diagnosis not present

## 2020-11-13 DIAGNOSIS — M6281 Muscle weakness (generalized): Secondary | ICD-10-CM | POA: Diagnosis not present

## 2020-11-13 DIAGNOSIS — R2681 Unsteadiness on feet: Secondary | ICD-10-CM | POA: Diagnosis not present

## 2020-11-14 DIAGNOSIS — M6281 Muscle weakness (generalized): Secondary | ICD-10-CM | POA: Diagnosis not present

## 2020-11-14 DIAGNOSIS — R2681 Unsteadiness on feet: Secondary | ICD-10-CM | POA: Diagnosis not present

## 2020-11-19 DIAGNOSIS — R2681 Unsteadiness on feet: Secondary | ICD-10-CM | POA: Diagnosis not present

## 2020-11-19 DIAGNOSIS — M6281 Muscle weakness (generalized): Secondary | ICD-10-CM | POA: Diagnosis not present

## 2020-11-20 DIAGNOSIS — R2681 Unsteadiness on feet: Secondary | ICD-10-CM | POA: Diagnosis not present

## 2020-11-20 DIAGNOSIS — M6281 Muscle weakness (generalized): Secondary | ICD-10-CM | POA: Diagnosis not present

## 2020-11-23 DIAGNOSIS — R2681 Unsteadiness on feet: Secondary | ICD-10-CM | POA: Diagnosis not present

## 2020-11-23 DIAGNOSIS — M6281 Muscle weakness (generalized): Secondary | ICD-10-CM | POA: Diagnosis not present

## 2020-11-25 DIAGNOSIS — R2681 Unsteadiness on feet: Secondary | ICD-10-CM | POA: Diagnosis not present

## 2020-11-25 DIAGNOSIS — M6281 Muscle weakness (generalized): Secondary | ICD-10-CM | POA: Diagnosis not present

## 2020-11-26 DIAGNOSIS — I259 Chronic ischemic heart disease, unspecified: Secondary | ICD-10-CM | POA: Diagnosis not present

## 2020-11-26 DIAGNOSIS — I69393 Ataxia following cerebral infarction: Secondary | ICD-10-CM | POA: Diagnosis not present

## 2020-11-26 DIAGNOSIS — J449 Chronic obstructive pulmonary disease, unspecified: Secondary | ICD-10-CM | POA: Diagnosis not present

## 2020-11-26 DIAGNOSIS — N189 Chronic kidney disease, unspecified: Secondary | ICD-10-CM | POA: Diagnosis not present

## 2020-11-26 DIAGNOSIS — L565 Disseminated superficial actinic porokeratosis (DSAP): Secondary | ICD-10-CM | POA: Diagnosis not present

## 2020-11-26 DIAGNOSIS — G20 Parkinson's disease: Secondary | ICD-10-CM | POA: Diagnosis not present

## 2020-11-27 DIAGNOSIS — R2681 Unsteadiness on feet: Secondary | ICD-10-CM | POA: Diagnosis not present

## 2020-11-27 DIAGNOSIS — M6281 Muscle weakness (generalized): Secondary | ICD-10-CM | POA: Diagnosis not present

## 2020-11-28 DIAGNOSIS — M6281 Muscle weakness (generalized): Secondary | ICD-10-CM | POA: Diagnosis not present

## 2020-11-28 DIAGNOSIS — R2681 Unsteadiness on feet: Secondary | ICD-10-CM | POA: Diagnosis not present

## 2020-12-03 DIAGNOSIS — M6281 Muscle weakness (generalized): Secondary | ICD-10-CM | POA: Diagnosis not present

## 2020-12-03 DIAGNOSIS — R2681 Unsteadiness on feet: Secondary | ICD-10-CM | POA: Diagnosis not present

## 2021-01-21 ENCOUNTER — Telehealth: Payer: Self-pay | Admitting: Family Medicine

## 2021-01-21 DIAGNOSIS — H6123 Impacted cerumen, bilateral: Secondary | ICD-10-CM | POA: Diagnosis not present

## 2021-01-21 DIAGNOSIS — H900 Conductive hearing loss, bilateral: Secondary | ICD-10-CM | POA: Diagnosis not present

## 2021-01-21 DIAGNOSIS — L565 Disseminated superficial actinic porokeratosis (DSAP): Secondary | ICD-10-CM | POA: Diagnosis not present

## 2021-01-21 DIAGNOSIS — D485 Neoplasm of uncertain behavior of skin: Secondary | ICD-10-CM | POA: Diagnosis not present

## 2021-01-21 DIAGNOSIS — G20 Parkinson's disease: Secondary | ICD-10-CM | POA: Diagnosis not present

## 2021-01-21 NOTE — Telephone Encounter (Signed)
[name], Lawyer's brother called and they have moved [place] to the [facility] on [address].  They sold his house.  They are trying to change his address with Social Security and SS will send [place] a form on his behalf.  Please let me know if you see this form.

## 2021-01-22 NOTE — Telephone Encounter (Signed)
I av asked adam to help keep an eye out. Donald Richardson

## 2021-01-28 ENCOUNTER — Ambulatory Visit (INDEPENDENT_AMBULATORY_CARE_PROVIDER_SITE_OTHER): Payer: Medicare Other | Admitting: Family Medicine

## 2021-01-28 ENCOUNTER — Encounter: Payer: Self-pay | Admitting: Family Medicine

## 2021-01-28 VITALS — BP 141/67 | HR 48 | Ht 68.0 in | Wt 134.0 lb

## 2021-01-28 DIAGNOSIS — I6381 Other cerebral infarction due to occlusion or stenosis of small artery: Secondary | ICD-10-CM | POA: Diagnosis not present

## 2021-01-28 DIAGNOSIS — G20 Parkinson's disease: Secondary | ICD-10-CM

## 2021-01-28 MED ORDER — CARBIDOPA-LEVODOPA 25-100 MG PO TABS: 1.0000 | ORAL_TABLET | Freq: Four times a day (QID) | ORAL | 3 refills | Status: AC

## 2021-01-28 NOTE — Progress Notes (Signed)
PATIENT: Donald Richardson DOB: 1941-01-20  REASON FOR VISIT: follow up HISTORY FROM: patient  Chief Complaint  Patient presents with   Follow-up    Pt with brother, rm 1.  states overall stable. No issues or concerns. Wanting to make sure medication is filled      HISTORY OF PRESENT ILLNESS: 01/28/21 ALL: Donald Richardson returns for follow up for PD. He continues generic Sinemet 1 tablet QID. He resides at [facility]. He reports doing well. No new or worsening symptoms. He is tolerating medication well. No obvious adverse effects. Tremor usually resolves for about an hour after taking medication but returns. It is fairly mild and not limiting. He denies changes in gait but does report less walking over the past year. He worked with PT about 2 months ago but did not find it very helpful. He prefers to use his wheelchair. He does walk to dinner every night using his walker. No falls. He feels he is sleeping very well. Appetite is good. No trouble swallowing. He does continues to smoke about 4 cigarettes a day. He is not interested in quitting.   He continues aspirin 81mg  and rosuvastatin 20mg  daily for stroke prevention. He is followed by PCP regularly. BP   10/13/2019 ALL:  Donald Richardson is a 80 y.o. male here today for follow up for PD. He continues Sinemet 1 tablet TID. He occasionally takes a 4th tablet and feels that the extra dose helps with mobility. He is sleeping well. He usually goes to bed around 1am and wakes at 12p. He is eating and drinking normally. No changes in strength or gait. He uses his walker at home. He is in wheelchair today. No falls. Donald Richardson with [facility] comes to assist him M,T,W,Th, and Sa. He is able to perform ADL's independently. Donald Richardson assists with household chores and meals. Donald Richardson is able to dose his own medications.   HISTORY: (copied from my note on 10/07/2018)  Donald Richardson is a 80 y.o. male here today for follow up of prior ischemic CVA in 2018  and PD. He continues carb/levo 25-100 TID. He reports that he decreased dose on his because his hand tremors were improved. He has not had any falls. He is able to navigate around the home with walker. He is taking Crestor 20mg , Plavix 75mg  and asa 325mg  daily. He has regular folow up with cardiology. He is scheduling appt with PCP soon. Last A1C was 5.8. Creatinine 1.37.    Patient's heart rate in the office is 34 to 40 bpm.  He and his brother both report that baseline heart rate has been in the 40s recently.  He is followed by cardiology regularly.  He denies any symptoms of dizziness, lightheadedness or excessive fatigue.   HISTORY: (copied from Dr Gladstone Lighter note on 09/01/2017)   UPDATE (09/01/17, VRP): Since last visit, doing well. Tolerating meds. No alleviating or aggravating factors. Tremor is stable (taking carb/levo 4 x per day).    UPDATE (02/20/17, VRP): Since last visit, doing better with tremor. Tolerating carb/levo --> helping with tremor. Gait is still poor. No alleviating or aggravating factors. Not driving much anymore.    PRIOR HPI (11/21/16) 80 year old male here for evaluation of stroke hospital discharge. Also requested for evaluation of possible Parkinson's disease. 06/20/16 patient was admitted to the hospital due to gait and balance difficulties, multiple falls. Symptoms lasted for 2 days before patient went to the hospital. He was diagnosed with right  brain subcortical ischemic infarction. Stroke workup was completed. He was exposed to follow-up in neurology clinic earlier after his discharge. Since that time patient had further balance and gait difficulty, with a severe fall in June resulting in several fractures. Now patient having significant difficulty with his activities of daily living. He lives alone but has a caregiver come every day for 8 hours. His brother lives in [place] but visits him once a week. Also of note patient has had at least 10-15 year history  of progressive resting tremor, stooped posture, shuffling gait, according to patient's brother. Patient feels like he has had right hand tremor for almost 30 years. Patient's father had Parkinson's disease.   REVIEW OF SYSTEMS: Out of a complete 14 system review of symptoms, the patient complains only of the following symptoms, weakness, bilateral hand tremor, and all other reviewed systems are negative.  ALLERGIES: No Known Allergies  HOME MEDICATIONS: Outpatient Medications Prior to Visit  Medication Sig Dispense Refill   acetaminophen (TYLENOL) 500 MG tablet Take 500 mg by mouth every 6 (six) hours as needed.     albuterol (VENTOLIN HFA) 108 (90 Base) MCG/ACT inhaler Inhale 2 puffs into the lungs every 6 (six) hours as needed for wheezing or shortness of breath. 6.7 g 0   alum & mag hydroxide-simeth (MAALOX/MYLANTA) 200-200-20 MG/5 SUSP Apply 1 application topically.     ASPIRIN LOW DOSE 81 MG EC tablet TAKE 1 TABLET BY MOUTH EVERY DAY 30 tablet 1   clopidogrel (PLAVIX) 75 MG tablet Take 1 tablet (75 mg total) by mouth daily. 90 tablet 3   dextromethorphan-guaiFENesin (ROBITUSSIN-DM) 10-100 MG/5ML liquid Take by mouth every 4 (four) hours as needed for cough.     Emollient (CETAPHIL) cream Apply 1 application topically 2 (two) times daily. Apply to face and extremities 90 g 0   Glucerna (GLUCERNA) LIQD Take 237 mLs by mouth 2 (two) times daily. 237 mL 0   lisinopril (ZESTRIL) 10 MG tablet TAKE 1 TABLET(10 MG) BY MOUTH DAILY 90 tablet 0   loperamide (IMODIUM) 2 MG capsule Take by mouth as needed for diarrhea or loose stools.     magnesium hydroxide (MILK OF MAGNESIA) 400 MG/5ML suspension Take by mouth daily as needed for mild constipation.     Neomycin-Bacitracin Zn-Polymyx 3.5-400-10000 OINT Apply to eye.     rosuvastatin (CRESTOR) 20 MG tablet TAKE 1 TABLET BY MOUTH NIGHTLY AT BEDTIME 30 tablet 1   carbidopa-levodopa (SINEMET IR) 25-100 MG tablet TAKE 1 TABLET BY MOUTH 4 TIMES DAILY 120  tablet 1   No facility-administered medications prior to visit.    PAST MEDICAL HISTORY: Past Medical History:  Diagnosis Date   ASHD (arteriosclerotic heart disease)    STENT   COPD (chronic obstructive pulmonary disease) (HCC)    Diabetes mellitus    Hgb A1C- 5.8 in May 2017   Hemorrhoids    Hyperlipidemia    Hypertension    Smoker     PAST SURGICAL HISTORY: Past Surgical History:  Procedure Laterality Date   Coronary artery stenting     INGUINAL HERNIA REPAIR Left 10/26/2015   Procedure: LEFT INGUINAL HERNIA REPAIR WITH MESH;  Surgeon: Jackolyn Confer, MD;  Location: [facility];  Service: General;  Laterality: Left;   INSERTION OF MESH Left 10/26/2015   Procedure: INSERTION OF MESH;  Surgeon: Jackolyn Confer, MD;  Location: [facility];  Service: General;  Laterality: Left;    FAMILY HISTORY: Family History  Problem Relation Age of Onset  Breast cancer Mother    Parkinson's disease Father     SOCIAL HISTORY: Social History   Socioeconomic History   Marital status: Single    Spouse name: Not on file   Number of children: Not on file   Years of education: Not on file   Highest education level: Not on file  Occupational History   Not on file  Tobacco Use   Smoking status: Former    Packs/day: 1.00    Types: Cigarettes    Quit date: 06/20/2016    Years since quitting: 4.6   Smokeless tobacco: Never   Tobacco comments:    03/16/2020 states he smokes 1/4-1/3 pack/day.  Nicotine patch declined  Vaping Use   Vaping Use: Never used  Substance and Sexual Activity   Alcohol use: No   Drug use: No   Sexual activity: Not Currently  Other Topics Concern   Not on file  Social History Narrative   Not on file   Social Determinants of Health   Financial Resource Strain: Not on file  Food Insecurity: Not on file  Transportation Needs: Not on file  Physical Activity: Not on file  Stress: Not on file  Social Connections: Not on file  Intimate Partner Violence: Not on file       PHYSICAL EXAM  Vitals:   01/28/21 1459  BP: (!) 141/67  Pulse: (!) 48  Weight: 134 lb (60.8 kg)  Height: 5\' 8"  (1.727 m)    Body mass index is 20.37 kg/m.  Generalized: Well developed, in no acute distress  Cardiology: normal rate and rhythm, no murmur noted Respiratory: clear to auscultation bilaterally  Neurological examination  Mentation: Alert oriented to time, place, history taking. Follows all commands speech and language fluent Cranial nerve II-XII: Pupils were equal round reactive to light. Extraocular movements were full, visual field were full on confrontational test. Facial sensation and strength were normal. Head turning and shoulder shrug  were normal and symmetric. Motor: The motor testing reveals 5 over 5 strength of all 4 extremities. Slight bradykinesia noted of upper extremities.  Sensory: Sensory testing is intact to soft touch on all 4 extremities. No evidence of extinction is noted.  Coordination: Cerebellar testing reveals good finger-nose-finger and heel-to-shin bilaterally.  Gait and station: Gait not assessed. Patient does not have walker, no assistive device available, patient does not feel comfortable walking without walker.  Reflexes: Deep tendon reflexes are symmetric and normal bilaterally.   DIAGNOSTIC DATA (LABS, IMAGING, TESTING) - I reviewed patient records, labs, notes, testing and imaging myself where available.  No flowsheet data found.   Lab Results  Component Value Date   WBC 6.5 04/30/2020   HGB 14.4 04/30/2020   HCT 43.4 04/30/2020   MCV 93 04/30/2020   PLT 146 (L) 04/30/2020      Component Value Date/Time   NA 140 04/30/2020 1509   K 4.4 04/30/2020 1509   CL 103 04/30/2020 1509   CO2 23 04/30/2020 1509   GLUCOSE 97 04/30/2020 1509   GLUCOSE 110 (H) 03/15/2020 0449   BUN 12 04/30/2020 1509   CREATININE 1.11 04/30/2020 1509   CREATININE 1.47 (H) 08/29/2015 0909   CALCIUM 8.8 04/30/2020 1509   PROT 7.0 04/30/2020 1509    ALBUMIN 3.9 04/30/2020 1509   AST 16 04/30/2020 1509   ALT 6 04/30/2020 1509   ALKPHOS 96 04/30/2020 1509   BILITOT 0.6 04/30/2020 1509   GFRNONAA 52.65 03/22/2020 0000   GFRNONAA 43 (L) 03/15/2020 0449  GFRAA 61.02 03/22/2020 0000   Lab Results  Component Value Date   CHOL 121 04/30/2020   HDL 40 04/30/2020   LDLCALC 61 04/30/2020   LDLDIRECT 99.1 09/18/2008   TRIG 108 04/30/2020   CHOLHDL 3.0 04/30/2020   Lab Results  Component Value Date   HGBA1C 6.0 (H) 04/30/2020   No results found for: VITAMINB12 Lab Results  Component Value Date   TSH 1.26 04/06/2020       ASSESSMENT AND PLAN 80 y.o. year old male  has a past medical history of ASHD (arteriosclerotic heart disease), COPD (chronic obstructive pulmonary disease) ([place]), Diabetes mellitus, Hemorrhoids, Hyperlipidemia, Hypertension, and Smoker. here with     ICD-10-CM   1. Parkinson's disease ([place])  G20 carbidopa-levodopa (SINEMET IR) 25-100 MG tablet    2. Basal ganglia infarction ([place])  I63.81        Donald Richardson is doing fairly well. He will continue generic Sinemet 4 times daily. He was educated on appropriate administration and possible side effects. I have encouraged him to try to be more active. He prefers wheelchair and does not like to walk. Gait not assessed as he does not feel comfortable walking without his walker. He was encouraged to continue follow up with PCP for stroke prevention and management of co morbidities. He does continue to smoke and was encouraged to consider cessation. He is fully vaccinated. Healthy lifestyle habits encouraged. Fall precautions advised. He will follow up in 1 year, sooner if needed.    No orders of the defined types were placed in this encounter.    Meds ordered this encounter  Medications   carbidopa-levodopa (SINEMET IR) 25-100 MG tablet    Sig: Take 1 tablet by mouth 4 (four) times daily.    Dispense:  360 tablet    Refill:  3    Order Specific Question:   Supervising  Provider    Answer:   Melvenia Beam [[id number]]        EVO JJKKX, FNP-C 01/28/2021, 4:20 PM [facility] [address] [place], [place] [address] [phone]

## 2021-01-28 NOTE — Patient Instructions (Addendum)
Below is our plan:  We will continue carbidopa/levodopa 25-100mg  four times daily.   Please make sure you are staying well hydrated. I recommend 50-60 ounces daily. Well balanced diet and regular exercise encouraged. Consistent sleep schedule with 6-8 hours recommended.   Please continue follow up with care team as directed.   Follow up with me in 1 year   You may receive a survey regarding today's visit. I encourage you to leave honest feed back as I do use this information to improve patient care. Thank you for seeing me today!

## 2021-02-04 NOTE — Telephone Encounter (Signed)
Donald Richardson, he will get back with [place] on a time for a virtual visit with patient and [name].  He states pt is very difficult to move around and Virtual will work the best.

## 2021-02-15 ENCOUNTER — Other Ambulatory Visit: Payer: Self-pay

## 2021-02-15 ENCOUNTER — Telehealth (INDEPENDENT_AMBULATORY_CARE_PROVIDER_SITE_OTHER): Payer: Medicare Other | Admitting: Family Medicine

## 2021-02-15 ENCOUNTER — Encounter: Payer: Self-pay | Admitting: Family Medicine

## 2021-02-15 VITALS — BP 145/75 | Wt 134.0 lb

## 2021-02-15 DIAGNOSIS — G20 Parkinson's disease: Secondary | ICD-10-CM | POA: Diagnosis not present

## 2021-02-15 DIAGNOSIS — I618 Other nontraumatic intracerebral hemorrhage: Secondary | ICD-10-CM

## 2021-02-15 DIAGNOSIS — I6381 Other cerebral infarction due to occlusion or stenosis of small artery: Secondary | ICD-10-CM

## 2021-02-15 DIAGNOSIS — I679 Cerebrovascular disease, unspecified: Secondary | ICD-10-CM

## 2021-02-15 NOTE — Progress Notes (Signed)
° °  Subjective:    Patient ID: Donald Richardson, male    DOB: Apr 09, 1940, 80 y.o.   MRN: [id number]  HPI Documentation for virtual audio and video telecommunications through [place] encounter:  The patient was located at home. 2 patient identifiers used.  The provider was located in the office. The patient did consent to this visit and is aware of possible charges through their insurance for this visit.  The other persons participating in this telemedicine service were none. Time spent on call was 6 minutes and in review of previous records >18 minutes total for counseling and coordination of care.  This virtual service is not related to other E/M service within previous 7 days.  Today's visit is to assess his mental capacities.  His brother is his [name] and has been handling his affairs for the last 4 years.  Early has tried to handle his affairs with bills and checks but has made mistakes on multiple occasions.  He is now in assisted living.  He has a previous history of stroke and did show evidence of small vessel cerebrovascular disease.  He does have a gait disturbance that interferes with his mobility.  Review of Systems     Objective:   Physical Exam Alert and in no distress and oriented x3.  When asked whether he wanted to have his brother handle all of his affairs he agreed have him do that.       Assessment & Plan:  Other nontraumatic intracerebral hemorrhage, unspecified laterality ([name])  Small vessel disease, cerebrovascular  Parkinson's disease ([name]) I will fill out appropriate paperwork to get this taken care of since he already has POA.

## 2021-03-12 ENCOUNTER — Telehealth: Payer: Self-pay | Admitting: Family Medicine

## 2021-03-12 NOTE — Telephone Encounter (Signed)
[name] called, Hedy Camara received [employer].  [name] requested copy of recent SS document that Dr Redmond School completed.  He states this will relieve Salih from duty.  [name] will pick up this week.  Copy up front in envelope.

## 2021-06-07 ENCOUNTER — Other Ambulatory Visit: Payer: Self-pay

## 2021-06-07 ENCOUNTER — Inpatient Hospital Stay (HOSPITAL_COMMUNITY)
Admission: EM | Admit: 2021-06-07 | Discharge: 2021-06-11 | DRG: 377 | Disposition: A | Discharge status: Home Health | Payer: Medicare Other | Attending: Internal Medicine | Admitting: Internal Medicine

## 2021-06-07 ENCOUNTER — Encounter (HOSPITAL_COMMUNITY): Payer: Self-pay | Admitting: Emergency Medicine

## 2021-06-07 ENCOUNTER — Emergency Department (HOSPITAL_COMMUNITY): Payer: Medicare Other

## 2021-06-07 DIAGNOSIS — R636 Underweight: Secondary | ICD-10-CM | POA: Diagnosis present

## 2021-06-07 DIAGNOSIS — I10 Essential (primary) hypertension: Secondary | ICD-10-CM | POA: Diagnosis present

## 2021-06-07 DIAGNOSIS — E1122 Type 2 diabetes mellitus with diabetic chronic kidney disease: Secondary | ICD-10-CM | POA: Diagnosis present

## 2021-06-07 DIAGNOSIS — N183 Chronic kidney disease, stage 3 unspecified: Secondary | ICD-10-CM | POA: Diagnosis present

## 2021-06-07 DIAGNOSIS — K922 Gastrointestinal hemorrhage, unspecified: Principal | ICD-10-CM

## 2021-06-07 DIAGNOSIS — I5043 Acute on chronic combined systolic (congestive) and diastolic (congestive) heart failure: Secondary | ICD-10-CM | POA: Diagnosis present

## 2021-06-07 DIAGNOSIS — Z79899 Other long term (current) drug therapy: Secondary | ICD-10-CM

## 2021-06-07 DIAGNOSIS — Z803 Family history of malignant neoplasm of breast: Secondary | ICD-10-CM

## 2021-06-07 DIAGNOSIS — Z7902 Long term (current) use of antithrombotics/antiplatelets: Secondary | ICD-10-CM

## 2021-06-07 DIAGNOSIS — Z7982 Long term (current) use of aspirin: Secondary | ICD-10-CM

## 2021-06-07 DIAGNOSIS — I2510 Atherosclerotic heart disease of native coronary artery without angina pectoris: Secondary | ICD-10-CM | POA: Diagnosis present

## 2021-06-07 DIAGNOSIS — Z6821 Body mass index (BMI) 21.0-21.9, adult: Secondary | ICD-10-CM

## 2021-06-07 DIAGNOSIS — G20 Parkinson's disease: Secondary | ICD-10-CM | POA: Diagnosis present

## 2021-06-07 DIAGNOSIS — K529 Noninfective gastroenteritis and colitis, unspecified: Secondary | ICD-10-CM | POA: Diagnosis not present

## 2021-06-07 DIAGNOSIS — I5042 Chronic combined systolic (congestive) and diastolic (congestive) heart failure: Secondary | ICD-10-CM | POA: Diagnosis present

## 2021-06-07 DIAGNOSIS — Z955 Presence of coronary angioplasty implant and graft: Secondary | ICD-10-CM

## 2021-06-07 DIAGNOSIS — N1831 Chronic kidney disease, stage 3a: Secondary | ICD-10-CM | POA: Diagnosis present

## 2021-06-07 DIAGNOSIS — I129 Hypertensive chronic kidney disease with stage 1 through stage 4 chronic kidney disease, or unspecified chronic kidney disease: Secondary | ICD-10-CM | POA: Diagnosis present

## 2021-06-07 DIAGNOSIS — Z820 Family history of epilepsy and other diseases of the nervous system: Secondary | ICD-10-CM

## 2021-06-07 DIAGNOSIS — I7143 Infrarenal abdominal aortic aneurysm, without rupture: Secondary | ICD-10-CM | POA: Diagnosis present

## 2021-06-07 DIAGNOSIS — J690 Pneumonitis due to inhalation of food and vomit: Secondary | ICD-10-CM | POA: Diagnosis present

## 2021-06-07 DIAGNOSIS — H9190 Unspecified hearing loss, unspecified ear: Secondary | ICD-10-CM | POA: Diagnosis present

## 2021-06-07 DIAGNOSIS — E785 Hyperlipidemia, unspecified: Secondary | ICD-10-CM | POA: Diagnosis present

## 2021-06-07 DIAGNOSIS — E119 Type 2 diabetes mellitus without complications: Secondary | ICD-10-CM

## 2021-06-07 DIAGNOSIS — K5791 Diverticulosis of intestine, part unspecified, without perforation or abscess with bleeding: Principal | ICD-10-CM | POA: Diagnosis present

## 2021-06-07 DIAGNOSIS — I714 Abdominal aortic aneurysm, without rupture, unspecified: Secondary | ICD-10-CM | POA: Diagnosis not present

## 2021-06-07 DIAGNOSIS — R918 Other nonspecific abnormal finding of lung field: Secondary | ICD-10-CM | POA: Diagnosis present

## 2021-06-07 DIAGNOSIS — Z66 Do not resuscitate: Secondary | ICD-10-CM | POA: Diagnosis present

## 2021-06-07 DIAGNOSIS — K625 Hemorrhage of anus and rectum: Secondary | ICD-10-CM | POA: Diagnosis not present

## 2021-06-07 DIAGNOSIS — I723 Aneurysm of iliac artery: Secondary | ICD-10-CM | POA: Diagnosis present

## 2021-06-07 DIAGNOSIS — Z87891 Personal history of nicotine dependence: Secondary | ICD-10-CM

## 2021-06-07 HISTORY — DX: Parkinson's disease without dyskinesia, without mention of fluctuations: G20.A1

## 2021-06-07 HISTORY — DX: Parkinson's disease: G20

## 2021-06-07 LAB — TYPE AND SCREEN
ABO/RH(D): B POS
Antibody Screen: NEGATIVE

## 2021-06-07 LAB — BASIC METABOLIC PANEL
Anion gap: 8 (ref 5–15)
BUN: 16 mg/dL (ref 8–23)
CO2: 24 mmol/L (ref 22–32)
Calcium: 8.8 mg/dL — ABNORMAL LOW (ref 8.9–10.3)
Chloride: 107 mmol/L (ref 98–111)
Creatinine, Ser: 1.45 mg/dL — ABNORMAL HIGH (ref 0.61–1.24)
GFR, Estimated: 49 mL/min — ABNORMAL LOW (ref >60)
Glucose, Bld: 126 mg/dL — ABNORMAL HIGH (ref 70–99)
Potassium: 4 mmol/L (ref 3.5–5.1)
Sodium: 139 mmol/L (ref 135–145)

## 2021-06-07 LAB — CBC WITH DIFFERENTIAL/PLATELET
Abs Immature Granulocytes: 0.03 10*3/uL (ref 0.00–0.07)
Basophils Absolute: 0 10*3/uL (ref 0.0–0.1)
Basophils Relative: 0 %
Eosinophils Absolute: 0 10*3/uL (ref 0.0–0.5)
Eosinophils Relative: 0 %
HCT: 47 % (ref 39.0–52.0)
Hemoglobin: 15.1 g/dL (ref 13.0–17.0)
Immature Granulocytes: 0 %
Lymphocytes Relative: 7 %
Lymphs Abs: 0.8 10*3/uL (ref 0.7–4.0)
MCH: 29.2 pg (ref 26.0–34.0)
MCHC: 32.1 g/dL (ref 30.0–36.0)
MCV: 90.9 fL (ref 80.0–100.0)
Monocytes Absolute: 1.2 10*3/uL — ABNORMAL HIGH (ref 0.1–1.0)
Monocytes Relative: 10 %
Neutro Abs: 9.8 10*3/uL — ABNORMAL HIGH (ref 1.7–7.7)
Neutrophils Relative %: 83 %
Platelets: 144 10*3/uL — ABNORMAL LOW (ref 150–400)
RBC: 5.17 MIL/uL (ref 4.22–5.81)
RDW: 14.8 % (ref 11.5–15.5)
WBC: 11.9 10*3/uL — ABNORMAL HIGH (ref 4.0–10.5)
nRBC: 0 % (ref 0.0–0.2)

## 2021-06-07 LAB — PROCALCITONIN: Procalcitonin: <0.1 ng/mL

## 2021-06-07 LAB — HEMOGLOBIN A1C
Hgb A1c MFr Bld: 6.3 % — ABNORMAL HIGH (ref 4.8–5.6)
Mean Plasma Glucose: 134.11 mg/dL

## 2021-06-07 LAB — ABO/RH: ABO/RH(D): B POS

## 2021-06-07 LAB — HEMOGLOBIN AND HEMATOCRIT, BLOOD
HCT: 42.2 % (ref 39.0–52.0)
Hemoglobin: 13.3 g/dL (ref 13.0–17.0)

## 2021-06-07 LAB — LACTIC ACID, PLASMA: Lactic Acid, Venous: 0.8 mmol/L (ref 0.5–1.9)

## 2021-06-07 MED ORDER — CETAPHIL MOISTURIZING EX LOTN
TOPICAL_LOTION | Freq: Two times a day (BID) | CUTANEOUS | Status: DC
  Administered 2021-06-08: 1 via TOPICAL
  Filled 2021-06-07 (×2): qty 473

## 2021-06-07 MED ORDER — ACETAMINOPHEN 650 MG RE SUPP: 650.0000 mg | Freq: Four times a day (QID) | RECTAL | Status: DC | PRN

## 2021-06-07 MED ORDER — CARBIDOPA-LEVODOPA 25-100 MG PO TABS
1.0000 | ORAL_TABLET | Freq: Four times a day (QID) | ORAL | Status: DC
  Administered 2021-06-07 – 2021-06-11 (×17): 1 via ORAL
  Filled 2021-06-07 (×18): qty 1

## 2021-06-07 MED ORDER — LACTATED RINGERS IV SOLN: INTRAVENOUS | Status: DC

## 2021-06-07 MED ORDER — IOHEXOL 300 MG/ML  SOLN
80.0000 mL | Freq: Once | INTRAMUSCULAR | Status: AC | PRN
  Administered 2021-06-07: 80 mL via INTRAVENOUS

## 2021-06-07 MED ORDER — ASPIRIN EC 81 MG PO TBEC
81.0000 mg | DELAYED_RELEASE_TABLET | Freq: Every day | ORAL | Status: DC
  Administered 2021-06-07 – 2021-06-11 (×5): 81 mg via ORAL
  Filled 2021-06-07 (×5): qty 1

## 2021-06-07 MED ORDER — INSULIN ASPART 100 UNIT/ML IJ SOLN
0.0000 [IU] | Freq: Three times a day (TID) | INTRAMUSCULAR | Status: DC
  Administered 2021-06-08 – 2021-06-09 (×2): 1 [IU] via SUBCUTANEOUS
  Administered 2021-06-09: 2 [IU] via SUBCUTANEOUS
  Administered 2021-06-10 (×2): 1 [IU] via SUBCUTANEOUS

## 2021-06-07 MED ORDER — SODIUM CHLORIDE 0.9 % IV BOLUS
500.0000 mL | Freq: Once | INTRAVENOUS | Status: AC
  Administered 2021-06-07: 500 mL via INTRAVENOUS

## 2021-06-07 MED ORDER — SODIUM CHLORIDE 0.9 % IV SOLN
INTRAVENOUS | Status: DC
  Administered 2021-06-08: 1 mL via INTRAVENOUS

## 2021-06-07 MED ORDER — ROSUVASTATIN CALCIUM 20 MG PO TABS
20.0000 mg | ORAL_TABLET | Freq: Every day | ORAL | Status: DC
  Administered 2021-06-07 – 2021-06-10 (×4): 20 mg via ORAL
  Filled 2021-06-07 (×4): qty 1

## 2021-06-07 MED ORDER — LISINOPRIL 10 MG PO TABS
10.0000 mg | ORAL_TABLET | Freq: Every day | ORAL | Status: DC
  Administered 2021-06-08 – 2021-06-11 (×4): 10 mg via ORAL
  Filled 2021-06-07 (×4): qty 1

## 2021-06-07 MED ORDER — ACETAMINOPHEN 325 MG PO TABS: 650.0000 mg | ORAL_TABLET | Freq: Four times a day (QID) | ORAL | Status: DC | PRN

## 2021-06-07 MED ORDER — LOPERAMIDE HCL 2 MG PO CAPS: 2.0000 mg | ORAL_CAPSULE | ORAL | Status: DC | PRN

## 2021-06-07 MED ORDER — CETAPHIL MOISTURIZING EX CREA: 1.0000 "application " | TOPICAL_CREAM | Freq: Two times a day (BID) | CUTANEOUS | Status: DC

## 2021-06-07 NOTE — Assessment & Plan Note (Addendum)
Follows with neurology. Has been stable. ?-Continue home regimen ? ?

## 2021-06-07 NOTE — Consult Note (Signed)
?Hospital Consult ? ? ? ?Reason for Consult:  Incidental finding of AAA, R CIA aneurysm, L CIA ectasia ?Requesting Physician:  Long ?MRN #:  [id number] ? ?History of Present Illness: This is a 81 y.o. male who presented to the emergency department with bloody bowel movements.  Patient is currently living at the [facility].  His history as listed below. ? ?Vascular surgery was called after CT abdomen pelvis demonstrated incidental finding of infrarenal abdominal aneurysm, right-sided common iliac artery aneurysm. ? ?On exam, Fidencio was comfortable.  He has hearing loss at baseline.  He denied abdominal pain, had no prior knowledge of his infrarenal abdominal aneurysm.  Denied symptoms of claudication, rest pain, tissue loss in his feet.  He denied family history of aneurysm.  Former smoker. ?No previous history of abdominal surgery ? ?Past Medical History:  ?Diagnosis Date  ? ASHD (arteriosclerotic heart disease)   ? STENT  ? COPD (chronic obstructive pulmonary disease) ([place])   ? Diabetes mellitus   ? Hgb A1C- 5.8 in May 2017  ? Hemorrhoids   ? Hyperlipidemia   ? Hypertension   ? Parkinson disease ([place])   ? Smoker   ? ? ?Past Surgical History:  ?Procedure Laterality Date  ? Coronary artery stenting    ? INGUINAL HERNIA REPAIR Left 10/26/2015  ? Procedure: LEFT INGUINAL HERNIA REPAIR WITH MESH;  Surgeon: Jackolyn Confer, MD;  Location: [facility];  Service: General;  Laterality: Left;  ? INSERTION OF MESH Left 10/26/2015  ? Procedure: INSERTION OF MESH;  Surgeon: Jackolyn Confer, MD;  Location: [facility];  Service: General;  Laterality: Left;  ? ? ?No Known Allergies ? ?Prior to Admission medications   ?Medication Sig Start Date End Date Taking? Authorizing Provider  ?ASPIRIN LOW DOSE 81 MG EC tablet TAKE 1 TABLET BY MOUTH EVERY DAY ?Patient taking differently: Take 81 mg by mouth daily. 09/10/20  Yes Denita Lung, MD  ?carbidopa-levodopa (SINEMET IR) 25-100 MG tablet Take 1 tablet by mouth 4 (four) times daily. 01/28/21   Yes Lomax, Amy, NP  ?Emollient (CETAPHIL) cream Apply 1 application topically 2 (two) times daily. Apply to face and extremities ?Patient taking differently: Apply 1 application. topically 2 (two) times daily. 04/18/20  Yes Medina-Vargas, [place], NP  ?lisinopril (ZESTRIL) 10 MG tablet TAKE 1 TABLET(10 MG) BY MOUTH DAILY ?Patient taking differently: Take 10 mg by mouth daily. 04/18/20  Yes Medina-Vargas, Monina C, NP  ?loperamide (IMODIUM) 2 MG capsule Take 2 mg by mouth as needed for diarrhea or loose stools. Not to exceed 8 doses in 24 hours   Yes [provider]  ?rosuvastatin (CRESTOR) 20 MG tablet TAKE 1 TABLET BY MOUTH NIGHTLY AT BEDTIME ?Patient taking differently: Take 20 mg by mouth at bedtime. 09/10/20  Yes Denita Lung, MD  ?clopidogrel (PLAVIX) 75 MG tablet Take 1 tablet (75 mg total) by mouth daily. ?Patient not taking: Reported on 06/07/2021 04/18/20   Medina-Vargas, Senaida Lange, NP  ?Glucerna (GLUCERNA) LIQD Take 237 mLs by mouth 2 (two) times daily. ?Patient not taking: Reported on 02/15/2021 04/18/20   Medina-Vargas, Senaida Lange, NP  ? ? ?Social History  ? ?Socioeconomic History  ? Marital status: Single  ?  Spouse name: Not on file  ? Number of children: Not on file  ? Years of education: Not on file  ? Highest education level: Not on file  ?Occupational History  ? Not on file  ?Tobacco Use  ? Smoking status: Former  ?  Packs/day: 1.00  ?  Types: Cigarettes  ?  Quit date: 06/20/2016  ?  Years since quitting: 4.9  ? Smokeless tobacco: Never  ? Tobacco comments:  ?  03/16/2020 states he smokes 1/4-1/3 pack/day.  Nicotine patch declined  ?Vaping Use  ? Vaping Use: Never used  ?Substance and Sexual Activity  ? Alcohol use: No  ? Drug use: No  ? Sexual activity: Not Currently  ?Other Topics Concern  ? Not on file  ?Social History Narrative  ? Not on file  ? ?Social Determinants of Health  ? ?Financial Resource Strain: Not on file  ?Food Insecurity: Not on file  ?Transportation Needs: Not on file   ?Physical Activity: Not on file  ?Stress: Not on file  ?Social Connections: Not on file  ?Intimate Partner Violence: Not on file  ? ?Family History  ?Problem Relation Age of Onset  ? Breast cancer Mother   ? Parkinson's disease Father   ? ? ?ROS: Otherwise negative unless mentioned in HPI ? ?Physical Examination ? ?Vitals:  ? 06/07/21 1430 06/07/21 1515  ?BP: (!) 135/93 (!) 158/98  ?Pulse: 78 79  ?Resp: (!) 26 14  ?Temp:    ?SpO2: 93% 97%  ? ?Body mass index is 21.29 kg/m?. ? ?General:  WDWN in NAD, appears older than stated age ?Gait: Not observed ?HENT: WNL, normocephalic ?Pulmonary: normal non-labored breathing ?Cardiac: regular ?Abdomen: soft, NT/ND, no masses ?Skin: without rashes ?Vascular Exam/Pulses: 2+ pedal pulses bilaterally  ?Extremities: without ischemic changes, without Gangrene , without cellulitis; without open wounds;  ?Musculoskeletal: no muscle wasting or atrophy  ?Neurologic: A&O X 3;  No focal weakness or paresthesias are detected; speech is fluent/normal ?Psychiatric:  The pt has Normal affect. ?Lymph:  Unremarkable ? ?CBC ?   ?Component Value Date/Time  ? WBC 11.9 (H) 06/07/2021 1333  ? RBC 5.17 06/07/2021 1333  ? HGB 15.1 06/07/2021 1333  ? HGB 14.4 04/30/2020 1509  ? HCT 47.0 06/07/2021 1333  ? HCT 43.4 04/30/2020 1509  ? PLT 144 (L) 06/07/2021 1333  ? PLT 146 (L) 04/30/2020 1509  ? MCV 90.9 06/07/2021 1333  ? MCV 93 04/30/2020 1509  ? MCH 29.2 06/07/2021 1333  ? MCHC 32.1 06/07/2021 1333  ? RDW 14.8 06/07/2021 1333  ? RDW 13.5 04/30/2020 1509  ? LYMPHSABS 0.8 06/07/2021 1333  ? LYMPHSABS 0.7 04/30/2020 1509  ? MONOABS 1.2 (H) 06/07/2021 1333  ? EOSABS 0.0 06/07/2021 1333  ? EOSABS 0.2 04/30/2020 1509  ? BASOSABS 0.0 06/07/2021 1333  ? BASOSABS 0.0 04/30/2020 1509  ? ? ?BMET ?   ?Component Value Date/Time  ? NA 139 06/07/2021 1333  ? NA 140 04/30/2020 1509  ? K 4.0 06/07/2021 1333  ? CL 107 06/07/2021 1333  ? CO2 24 06/07/2021 1333  ? GLUCOSE 126 (H) 06/07/2021 1333  ? BUN 16 06/07/2021  1333  ? BUN 12 04/30/2020 1509  ? CREATININE 1.45 (H) 06/07/2021 1333  ? CREATININE 1.47 (H) 08/29/2015 8341  ? CALCIUM 8.8 (L) 06/07/2021 1333  ? GFRNONAA 49 (L) 06/07/2021 1333  ? GFRAA 61.02 03/22/2020 0000  ? ? ?COAGS: ?Lab Results  ?Component Value Date  ? INR 1.04 08/20/2016  ? INR 1.08 06/20/2016  ? INR 1.04 12/03/2009  ? ? ? ?Non-Invasive Vascular Imaging:   ?See CT with contrast ? ? ? ? ?ASSESSMENT/PLAN: This is a 81 y.o. male who presented to the ED this morning with rectal bleeding.  CT demonstrated colitis, as well as incidental finding of 5.5 cm infrarenal abdominal aneurysm, 3  cm right-sided common iliac artery aneurysm.  No signs of stranding, no signs of pending rupture.  On exam, Carliss was asymptomatic, with no abdominal pain. ? ?I had a long discussion with Cathy regarding his aneurysmal disease.  At 5.5 cm, we should discuss elective repair of his AAA in the outpatient setting.  I asked him to notify nursing immediately should he have new onset, severe abdominal pain, back pain, chest pain. ? ?I am available as needed during his hospitalization.  Please call should any questions or concerns arise.  I will ensure he has outpatient follow-up with me. ? ? ?J. Melene Muller MD MS ?[facility] ?[phone] ?06/07/2021  ?4:06 PM ? ?

## 2021-06-07 NOTE — Assessment & Plan Note (Addendum)
-   Continue home regimen 

## 2021-06-07 NOTE — Subjective & Objective (Signed)
Donald Richardson, a 81 y/o man with h/o Parkinson's disease, cerebrel small vessel disease, CKD III, combined heart failure, HLD and DM 2 in remission presents with a report of BRBPR. He has no prior h/o GI problems in Epic except colonoscopy in 2009 with diverticula and 1 sessile polyp. Hgb stable.  He had evidence of inflammatory collitis on CT A/P. INcidental finding of 5.9x5.4x7.4 infrarenal aortic aneurysm. TRH called to admit for further evaluation and observation. ?

## 2021-06-07 NOTE — Assessment & Plan Note (Addendum)
Diet controlled.  

## 2021-06-07 NOTE — ED Notes (Signed)
Patient transported to CT 

## 2021-06-07 NOTE — ED Triage Notes (Signed)
Pt here via [facility] from [facility] for rectal bleeding. Pt noticed blood in diarrhea last night, staff noticed this AM, pt had another BM this morning but states the toilet was "full of bright red blood". Pt hx of parkinson's, HTN, diabetes 154/93, 94% RA, 92HR.  ?

## 2021-06-07 NOTE — Assessment & Plan Note (Addendum)
Patient w/ h/o diverticula ?- Painless bleed. Suspect diverticular bleed. ?-appears resolved ? ?  ?

## 2021-06-07 NOTE — ED Notes (Signed)
RN noticed pt lower legs were swollen. Pt stated ii is swollen d/t socks being small. Pt had a raised wound on lower right shin which he states been there for awhile and eventually should fall off. Pt skin is flaky on the forehead and face.  ?

## 2021-06-07 NOTE — ED Notes (Signed)
Provided pt some water and secretary ordered dinner tray.  ?

## 2021-06-07 NOTE — Assessment & Plan Note (Addendum)
-   Continue home meds °

## 2021-06-07 NOTE — ED Provider Notes (Signed)
?[facility] ?Provider Note ? ? ?CSN: [id number] ?Arrival date & time: 06/07/21  1301 ? ?  ? ?History ? ?Chief Complaint  ?Patient presents with  ? Rectal Bleeding  ? ? ?Donald Richardson is a 81 y.o. male. ? ?81 yo M with a chief complaints of bright red blood per rectum.  The patient had an episode last night and then had another 1 this morning.  He has been feeling okay otherwise.  Has had some abdominal cramping that occurs off and on.  He denies any vomiting.  He thinks he is on a medication to thin his blood but he is not sure what it is. ? ? ?Rectal Bleeding ? ?  ? ?Home Medications ?Prior to Admission medications   ?Medication Sig Start Date End Date Taking? Authorizing Provider  ?acetaminophen (TYLENOL) 500 MG tablet Take 500 mg by mouth every 6 (six) hours as needed.    [provider]  ?albuterol (VENTOLIN HFA) 108 (90 Base) MCG/ACT inhaler Inhale 2 puffs into the lungs every 6 (six) hours as needed for wheezing or shortness of breath. 04/18/20   Medina-Vargas, Monina C, NP  ?alum & mag hydroxide-simeth (MAALOX/MYLANTA) 200-200-20 MG/5 SUSP Apply 1 application topically. ?Patient not taking: Reported on 02/15/2021    [provider]  ?ASPIRIN LOW DOSE 81 MG EC tablet TAKE 1 TABLET BY MOUTH EVERY DAY 09/10/20   Denita Lung, MD  ?carbidopa-levodopa (SINEMET IR) 25-100 MG tablet Take 1 tablet by mouth 4 (four) times daily. 01/28/21   Lomax, Amy, NP  ?clopidogrel (PLAVIX) 75 MG tablet Take 1 tablet (75 mg total) by mouth daily. 04/18/20   Medina-Vargas, Monina C, NP  ?dextromethorphan-guaiFENesin (ROBITUSSIN-DM) 10-100 MG/5ML liquid Take by mouth every 4 (four) hours as needed for cough. ?Patient not taking: Reported on 02/15/2021    [provider]  ?Emollient (CETAPHIL) cream Apply 1 application topically 2 (two) times daily. Apply to face and extremities 04/18/20   Medina-Vargas, Senaida Lange, NP  ?Glucerna (GLUCERNA) LIQD Take 237 mLs by mouth 2 (two)  times daily. ?Patient not taking: Reported on 02/15/2021 04/18/20   Medina-Vargas, Senaida Lange, NP  ?lisinopril (ZESTRIL) 10 MG tablet TAKE 1 TABLET(10 MG) BY MOUTH DAILY 04/18/20   Medina-Vargas, Monina C, NP  ?loperamide (IMODIUM) 2 MG capsule Take by mouth as needed for diarrhea or loose stools. ?Patient not taking: Reported on 02/15/2021    [provider]  ?magnesium hydroxide (MILK OF MAGNESIA) 400 MG/5ML suspension Take by mouth daily as needed for mild constipation. ?Patient not taking: Reported on 02/15/2021    [provider]  ?Neomycin-Bacitracin Zn-Polymyx 3.5-400-10000 OINT Apply to eye. ?Patient not taking: Reported on 02/15/2021    [provider]  ?rosuvastatin (CRESTOR) 20 MG tablet TAKE 1 TABLET BY MOUTH NIGHTLY AT BEDTIME 09/10/20   Denita Lung, MD  ?   ? ?Allergies    ?Patient has no known allergies.   ? ?Review of Systems   ?Review of Systems  ?Gastrointestinal:  Positive for hematochezia.  ? ?Physical Exam ?Updated Vital Signs ?There were no vitals taken for this visit. ?Physical Exam ?Vitals and nursing note reviewed.  ?Constitutional:   ?   Appearance: He is well-developed.  ?HENT:  ?   Head: Normocephalic and atraumatic.  ?Eyes:  ?   Pupils: Pupils are equal, round, and reactive to light.  ?Neck:  ?   Vascular: No JVD.  ?Cardiovascular:  ?   Rate and Rhythm: Normal rate and  regular rhythm.  ?   Heart sounds: No murmur heard. ?  No friction rub. No gallop.  ?Pulmonary:  ?   Effort: No respiratory distress.  ?   Breath sounds: No wheezing.  ?Abdominal:  ?   General: There is no distension.  ?   Tenderness: There is no abdominal tenderness. There is no guarding or rebound.  ?Musculoskeletal:     ?   General: Normal range of motion.  ?   Cervical back: Normal range of motion and neck supple.  ?Skin: ?   Coloration: Skin is not pale.  ?   Findings: No rash.  ?   Comments: Heaped up lesion to the right mid leg.  Old per the patient.  ?Neurological:  ?   Mental Status: He  is alert and oriented to person, place, and time.  ?Psychiatric:     ?   Behavior: Behavior normal.  ? ? ?ED Results / Procedures / Treatments   ?Labs ?(all labs ordered are listed, but only abnormal results are displayed) ?Labs Reviewed  ?CBC WITH DIFFERENTIAL/PLATELET  ?BASIC METABOLIC PANEL  ?TYPE AND SCREEN  ? ? ?EKG ?None ? ?Radiology ?No results found. ? ?Procedures ?Procedures  ? ? ?Medications Ordered in ED ?Medications - No data to display ? ?ED Course/ Medical Decision Making/ A&P ?  ?                        ?Medical Decision Making ?Amount and/or Complexity of Data Reviewed ?Labs: ordered. ?Radiology: ordered. ? ?Risk ?OTC drugs. ?Prescription drug management. ?Decision regarding hospitalization. ? ? ?81 yo M with a chief complaints of bright red blood per rectum.  He has had 2 events now.  He is on aspirin and Plavix. ? ?Patient's hemoglobin is a bit higher than it is at baseline.  With his abdominal discomfort we will obtain a CT scan of the abdomen pelvis. ? ?Labs without anemia.  Renal function at baseline.  Signed out to Dr. Laverta Baltimore, please see their note for further details of care in the ED. ? ? ?The patients results and plan were reviewed and discussed.   ?Any x-rays performed were independently reviewed by myself.  ? ?Differential diagnosis were considered with the presenting HPI. ? ?Medications  ?aspirin EC tablet 81 mg (81 mg Oral Given 06/09/21 0907)  ?lisinopril (ZESTRIL) tablet 10 mg (10 mg Oral Given 06/09/21 0907)  ?rosuvastatin (CRESTOR) tablet 20 mg (20 mg Oral Given 06/09/21 2109)  ?loperamide (IMODIUM) capsule 2 mg (has no administration in time range)  ?carbidopa-levodopa (SINEMET IR) 25-100 MG per tablet immediate release 1 tablet (1 tablet Oral Given 06/10/21 0610)  ?cetaphil lotion ( Topical Given 06/09/21 2109)  ?insulin aspart (novoLOG) injection 0-9 Units (0 Units Subcutaneous Not Given 06/10/21 0652)  ?0.9 %  sodium chloride infusion (1 mL Intravenous New Bag/Given 06/08/21 1420)   ?acetaminophen (TYLENOL) tablet 650 mg (has no administration in time range)  ?  Or  ?acetaminophen (TYLENOL) suppository 650 mg (has no administration in time range)  ?Ampicillin-Sulbactam (UNASYN) 3 g in sodium chloride 0.9 % 100 mL IVPB (0 g Intravenous Stopped 06/10/21 0518)  ?iohexol (OMNIPAQUE) 300 MG/ML solution 80 mL (80 mLs Intravenous Contrast Given 06/07/21 1455)  ?sodium chloride 0.9 % bolus 500 mL (0 mLs Intravenous Stopped 06/07/21 1811)  ? ? ?Vitals:  ? 06/09/21 1244 06/09/21 1948 06/09/21 2303 06/10/21 0332  ?BP: 120/69 (!) 151/75 (!) 163/78 136/75  ?Pulse:  (!) 59 (!) 47 Marland Kitchen)  52  ?Resp:  '18 16 14  '$ ?Temp: 99 ?F (37.2 ?C) 98.9 ?F (37.2 ?C) 99.1 ?F (37.3 ?C) 98.9 ?F (37.2 ?C)  ?TempSrc: Oral Oral Oral Oral  ?SpO2:  96% 96% 90%  ?Weight:      ?Height:      ? ? ?Final diagnoses:  ?Gastrointestinal hemorrhage, unspecified gastrointestinal hemorrhage type  ?Colitis  ? ? ?Admission/ observation were discussed with the admitting physician, patient and/or family and they are comfortable with the plan.  ? ? ? ? ? ? ? ? ?Final Clinical Impression(s) / ED Diagnoses ?Final diagnoses:  ?None  ? ? ?Rx / DC Orders ?ED Discharge Orders   ? ? None  ? ?  ? ? ?  ?Deno Etienne, DO ?06/10/21 8948 ? ?

## 2021-06-07 NOTE — H&P (Signed)
?History and Physical  ? ? ?Donald Richardson CNO:[id number] DOB: 1940/07/19 DOA: 06/07/2021 ? ?DOS: the patient was seen and examined on 06/07/2021 ? ?PCP: Denita Lung, MD  ? ?Patient coming from: SNF ? ?I have personally briefly reviewed patient's old medical records in [place] ? ?Donald Richardson, a 81 y/o man with h/o Parkinson's disease, cerebrel small vessel disease, CKD III, combined heart failure, HLD and DM 2 in remission presents with a report of BRBPR. He has no prior h/o GI problems in Epic except colonoscopy in 2009 with diverticula and 1 sessile polyp. Hgb stable.  He had evidence of inflammatory collitis on CT A/P. INcidental finding of 5.9x5.4x7.4 infrarenal aortic aneurysm. [facility] called to admit for further evaluation and observation.  ? ?ED Course: Hemodynamically stable. In verbal report - no acute distess. Relevant lab: Hgb 15.1, WBC 11.9, BUN 14.5 (baseline 12), Cr 1.45 (baseline 1.4). CT c/w inflammatory collitis with bowel wall thickening, scattered diverticula, incidental finding of patchy infiltrate LLL c/w atelectasis vs PNA. [facility] called to admit for obs for further bleeding. ? ?Review of Systems:  ?Review of Systems  ?Constitutional:  Negative for fever and weight loss.  ?HENT:  Positive for hearing loss.   ?Eyes: Negative.   ?Respiratory:  Negative for cough, sputum production, shortness of breath and wheezing.   ?Cardiovascular:  Negative for chest pain.  ?Gastrointestinal:  Positive for blood in stool. Negative for abdominal pain and constipation.  ?Genitourinary: Negative.   ?Musculoskeletal:  Positive for myalgias.  ?Neurological:  Positive for speech change and weakness.  ?Endo/Heme/Allergies: Negative.   ?Psychiatric/Behavioral: Negative.    ? ?Past Medical History:  ?Diagnosis Date  ? ASHD (arteriosclerotic heart disease)   ? STENT  ? COPD (chronic obstructive pulmonary disease) ([place])   ? Diabetes mellitus   ? Hgb A1C- 5.8 in May 2017  ? Hemorrhoids   ? Hyperlipidemia   ? Hypertension   ?  Parkinson disease ([place])   ? Smoker   ? ? ?Past Surgical History:  ?Procedure Laterality Date  ? Coronary artery stenting    ? INGUINAL HERNIA REPAIR Left 10/26/2015  ? Procedure: LEFT INGUINAL HERNIA REPAIR WITH MESH;  Surgeon: Jackolyn Confer, MD;  Location: [facility];  Service: General;  Laterality: Left;  ? INSERTION OF MESH Left 10/26/2015  ? Procedure: INSERTION OF MESH;  Surgeon: Jackolyn Confer, MD;  Location: [facility];  Service: General;  Laterality: Left;  ? ? ?Soc Hx - life-long batchelor. Brother in [place]. Lives at [facility]. Grandfather rom [place]. Worked as Water quality scientist, claims his grandfather, father and he all helped build [employer]. ? ? reports that he quit smoking about 4 years ago. His smoking use included cigarettes. He smoked an average of 1 pack per day. He has never used smokeless tobacco. He reports that he does not drink alcohol and does not use drugs. ? ?No Known Allergies ? ?Family History  ?Problem Relation Age of Onset  ? Breast cancer Mother   ? Parkinson's disease Father   ? ? ?Prior to Admission medications   ?Medication Sig Start Date End Date Taking? Authorizing Provider  ?ASPIRIN LOW DOSE 81 MG EC tablet TAKE 1 TABLET BY MOUTH EVERY DAY ?Patient taking differently: Take 81 mg by mouth daily. 09/10/20  Yes Denita Lung, MD  ?carbidopa-levodopa (SINEMET IR) 25-100 MG tablet Take 1 tablet by mouth 4 (four) times daily. 01/28/21  Yes Lomax, Amy, NP  ?Emollient (CETAPHIL) cream Apply 1 application topically 2 (two)  times daily. Apply to face and extremities ?Patient taking differently: Apply 1 application. topically 2 (two) times daily. 04/18/20  Yes Medina-Vargas, [place], NP  ?lisinopril (ZESTRIL) 10 MG tablet TAKE 1 TABLET(10 MG) BY MOUTH DAILY ?Patient taking differently: Take 10 mg by mouth daily. 04/18/20  Yes Medina-Vargas, Monina C, NP  ?loperamide (IMODIUM) 2 MG capsule Take 2 mg by mouth as needed for diarrhea or loose stools. Not to exceed 8 doses in 24 hours    Yes [provider]  ?rosuvastatin (CRESTOR) 20 MG tablet TAKE 1 TABLET BY MOUTH NIGHTLY AT BEDTIME ?Patient taking differently: Take 20 mg by mouth at bedtime. 09/10/20  Yes Denita Lung, MD  ?clopidogrel (PLAVIX) 75 MG tablet Take 1 tablet (75 mg total) by mouth daily. ?Patient not taking: Reported on 06/07/2021 04/18/20   Medina-Vargas, Senaida Lange, NP  ?Glucerna (GLUCERNA) LIQD Take 237 mLs by mouth 2 (two) times daily. ?Patient not taking: Reported on 02/15/2021 04/18/20   Medina-Vargas, Senaida Lange, NP  ? ? ?Physical Exam: ?Vitals:  ? 06/07/21 1700 06/07/21 1730 06/07/21 1745 06/07/21 1800  ?BP: (!) 149/80 (!) 169/84 (!) 173/86 (!) 172/130  ?Pulse: 66 68 67 74  ?Resp: '20 12 13 15  '$ ?Temp:      ?TempSrc:      ?SpO2: 93% 96% 94% 97%  ?Weight:      ?Height:      ? ? ?Physical Exam ?Constitutional:   ?   Appearance: He is not ill-appearing.  ?   Comments: underweight  ?HENT:  ?   Head: Normocephalic and atraumatic.  ?   Right Ear: External ear normal.  ?   Left Ear: External ear normal.  ?   Nose: Nose normal.  ?   Mouth/Throat:  ?   Mouth: Mucous membranes are moist.  ?   Comments: Needs teeth cleaning, no gingivitis, no caries, no missing teeth ?Eyes:  ?   Extraocular Movements: Extraocular movements intact.  ?   Pupils: Pupils are equal, round, and reactive to light.  ?Neck:  ?   Comments: Mild-moderate neck stiffness (chronic with PD) ?Cardiovascular:  ?   Rate and Rhythm: Normal rate and regular rhythm.  ?   Pulses: Normal pulses.  ?   Heart sounds: Normal heart sounds.  ?Pulmonary:  ?   Effort: Pulmonary effort is normal.  ?   Breath sounds: Normal breath sounds. No wheezing or rales.  ?Abdominal:  ?   General: Abdomen is flat.  ?   Palpations: Abdomen is soft. There is no mass.  ?   Tenderness: There is no abdominal tenderness. There is no guarding.  ?Musculoskeletal:  ?   Comments: Limited by cogwheeling, rigidity  ?Skin: ?   General: Skin is warm and dry.  ?   Comments: Chronic dermatosis with scaling on  face, nose, hands. Multiple abrasions, minor, legs.  ?Neurological:  ?   General: No focal deficit present.  ?   Mental Status: He is alert and oriented to person, place, and time.  ?   Comments: Not ambulated. No significant resting tremor.   ?Psychiatric:     ?   Mood and Affect: Mood normal.     ?   Behavior: Behavior normal.     ?   Thought Content: Thought content normal.  ?  ? ?Labs on Admission: I have personally reviewed following labs and imaging studies ? ?CBC: ?Recent Labs  ?Lab 06/07/21 ?1333  ?WBC 11.9*  ?NEUTROABS 9.8*  ?HGB 15.1  ?  HCT 47.0  ?MCV 90.9  ?PLT 144*  ? ?Basic Metabolic Panel: ?Recent Labs  ?Lab 06/07/21 ?1333  ?NA 139  ?K 4.0  ?CL 107  ?CO2 24  ?GLUCOSE 126*  ?BUN 16  ?CREATININE 1.45*  ?CALCIUM 8.8*  ? ?GFR: ?Estimated Creatinine Clearance: 37.1 mL/min (A) (by C-G formula based on SCr of 1.45 mg/dL (H)). ?Liver Function Tests: ?No results for input(s): AST, ALT, ALKPHOS, BILITOT, PROT, ALBUMIN in the last 168 hours. ?No results for input(s): LIPASE, AMYLASE in the last 168 hours. ?No results for input(s): AMMONIA in the last 168 hours. ?Coagulation Profile: ?No results for input(s): INR, PROTIME in the last 168 hours. ?Cardiac Enzymes: ?No results for input(s): CKTOTAL, CKMB, CKMBINDEX, TROPONINI in the last 168 hours. ?BNP (last 3 results) ?No results for input(s): PROBNP in the last 8760 hours. ?HbA1C: ?No results for input(s): HGBA1C in the last 72 hours. ?CBG: ?No results for input(s): GLUCAP in the last 168 hours. ?Lipid Profile: ?No results for input(s): CHOL, HDL, LDLCALC, TRIG, CHOLHDL, LDLDIRECT in the last 72 hours. ?Thyroid Function Tests: ?No results for input(s): TSH, T4TOTAL, FREET4, T3FREE, THYROIDAB in the last 72 hours. ?Anemia Panel: ?No results for input(s): VITAMINB12, FOLATE, FERRITIN, TIBC, IRON, RETICCTPCT in the last 72 hours. ?Urine analysis: ?   ?Component Value Date/Time  ? [place] YELLOW 03/03/2020 1912  ? APPEARANCEUR CLEAR 03/03/2020 1912  ? LABSPEC 1.018  03/03/2020 1912  ? PHURINE 7.0 03/03/2020 1912  ? [place] NEGATIVE 03/03/2020 1912  ? HGBUR SMALL (A) 03/03/2020 1912  ? [place] NEGATIVE 03/03/2020 1912  ? [place] NEGATIVE 03/03/2020 1912  ?

## 2021-06-07 NOTE — ED Provider Notes (Signed)
Blood pressure (!) 135/93, pulse 78, temperature 98.3 ?F (36.8 ?C), temperature source Oral, resp. rate (!) 26, height '5\' 8"'$  (1.727 m), weight 63.5 kg, SpO2 93 %. ? ?Assuming care from Dr. Tyrone Nine.  In short, Donald Richardson is a 81 y.o. male with a chief complaint of Rectal Bleeding ?Marland Kitchen  Refer to the original H&P for additional details. ? ?The current plan of care is to f/u on CT and reassess. Patient on ASA and Plavix with GI bleeding. ? ?03:50 PM ?Spoke with Dr. Virl Cagey with Vascular. He will consult. Plan for [facility] admit. Hold on abx pending C diff testing. ? ?Discussed patient's case with [facility], Dr. Linda Hedges to request admission. Patient and family (if present) updated with plan. ? ?I reviewed all nursing notes, vitals, pertinent old records, EKGs, labs, imaging (as available). ? ? ?  ?Margette Fast, MD ?06/07/21 1559 ? ?

## 2021-06-07 NOTE — ED Notes (Signed)
Pt has returned from CT. Pt is resting. ?

## 2021-06-07 NOTE — Assessment & Plan Note (Addendum)
Incidental finding on CT A/P - relatively large: 5.9x5.4x7.4. Seen by vascular surgery - Dr. Virl Cagey. For outpatient follow up to discuss intervention. ?

## 2021-06-07 NOTE — Assessment & Plan Note (Addendum)
-  daily labs ?-appears to be at baseline ?

## 2021-06-07 NOTE — Assessment & Plan Note (Addendum)
-  appears to be aspiration ?- unasyn-- change to augmentin ?-MBS does not show overt aspiration ?

## 2021-06-08 ENCOUNTER — Observation Stay (HOSPITAL_COMMUNITY): Payer: Medicare Other

## 2021-06-08 DIAGNOSIS — Z6821 Body mass index (BMI) 21.0-21.9, adult: Secondary | ICD-10-CM | POA: Diagnosis not present

## 2021-06-08 DIAGNOSIS — Z955 Presence of coronary angioplasty implant and graft: Secondary | ICD-10-CM | POA: Diagnosis not present

## 2021-06-08 DIAGNOSIS — Z79899 Other long term (current) drug therapy: Secondary | ICD-10-CM | POA: Diagnosis not present

## 2021-06-08 DIAGNOSIS — N1831 Chronic kidney disease, stage 3a: Secondary | ICD-10-CM | POA: Diagnosis present

## 2021-06-08 DIAGNOSIS — I2510 Atherosclerotic heart disease of native coronary artery without angina pectoris: Secondary | ICD-10-CM | POA: Diagnosis present

## 2021-06-08 DIAGNOSIS — Z803 Family history of malignant neoplasm of breast: Secondary | ICD-10-CM | POA: Diagnosis not present

## 2021-06-08 DIAGNOSIS — I7143 Infrarenal abdominal aortic aneurysm, without rupture: Secondary | ICD-10-CM | POA: Diagnosis present

## 2021-06-08 DIAGNOSIS — K625 Hemorrhage of anus and rectum: Secondary | ICD-10-CM | POA: Diagnosis not present

## 2021-06-08 DIAGNOSIS — H9190 Unspecified hearing loss, unspecified ear: Secondary | ICD-10-CM | POA: Diagnosis present

## 2021-06-08 DIAGNOSIS — E1122 Type 2 diabetes mellitus with diabetic chronic kidney disease: Secondary | ICD-10-CM | POA: Diagnosis present

## 2021-06-08 DIAGNOSIS — G20 Parkinson's disease: Secondary | ICD-10-CM | POA: Diagnosis present

## 2021-06-08 DIAGNOSIS — Z820 Family history of epilepsy and other diseases of the nervous system: Secondary | ICD-10-CM | POA: Diagnosis not present

## 2021-06-08 DIAGNOSIS — K5791 Diverticulosis of intestine, part unspecified, without perforation or abscess with bleeding: Secondary | ICD-10-CM | POA: Diagnosis present

## 2021-06-08 DIAGNOSIS — I723 Aneurysm of iliac artery: Secondary | ICD-10-CM | POA: Diagnosis present

## 2021-06-08 DIAGNOSIS — K529 Noninfective gastroenteritis and colitis, unspecified: Secondary | ICD-10-CM | POA: Diagnosis present

## 2021-06-08 DIAGNOSIS — Z7982 Long term (current) use of aspirin: Secondary | ICD-10-CM | POA: Diagnosis not present

## 2021-06-08 DIAGNOSIS — Z7902 Long term (current) use of antithrombotics/antiplatelets: Secondary | ICD-10-CM | POA: Diagnosis not present

## 2021-06-08 DIAGNOSIS — K922 Gastrointestinal hemorrhage, unspecified: Secondary | ICD-10-CM | POA: Diagnosis not present

## 2021-06-08 DIAGNOSIS — I5043 Acute on chronic combined systolic (congestive) and diastolic (congestive) heart failure: Secondary | ICD-10-CM | POA: Diagnosis not present

## 2021-06-08 DIAGNOSIS — I5042 Chronic combined systolic (congestive) and diastolic (congestive) heart failure: Secondary | ICD-10-CM | POA: Diagnosis present

## 2021-06-08 DIAGNOSIS — Z66 Do not resuscitate: Secondary | ICD-10-CM | POA: Diagnosis present

## 2021-06-08 DIAGNOSIS — E785 Hyperlipidemia, unspecified: Secondary | ICD-10-CM | POA: Diagnosis present

## 2021-06-08 DIAGNOSIS — R636 Underweight: Secondary | ICD-10-CM | POA: Diagnosis present

## 2021-06-08 DIAGNOSIS — Z87891 Personal history of nicotine dependence: Secondary | ICD-10-CM | POA: Diagnosis not present

## 2021-06-08 DIAGNOSIS — I129 Hypertensive chronic kidney disease with stage 1 through stage 4 chronic kidney disease, or unspecified chronic kidney disease: Secondary | ICD-10-CM | POA: Diagnosis present

## 2021-06-08 DIAGNOSIS — J690 Pneumonitis due to inhalation of food and vomit: Secondary | ICD-10-CM | POA: Diagnosis present

## 2021-06-08 LAB — HEMOGLOBIN AND HEMATOCRIT, BLOOD
HCT: 39.5 % (ref 39.0–52.0)
HCT: 39.5 % (ref 39.0–52.0)
Hemoglobin: 12.9 g/dL — ABNORMAL LOW (ref 13.0–17.0)
Hemoglobin: 12.9 g/dL — ABNORMAL LOW (ref 13.0–17.0)

## 2021-06-08 LAB — LIPID PANEL
Cholesterol: 86 mg/dL (ref 0–200)
HDL: 32 mg/dL — ABNORMAL LOW (ref >40)
LDL Cholesterol: 35 mg/dL (ref 0–99)
Total CHOL/HDL Ratio: 2.7 RATIO
Triglycerides: 96 mg/dL (ref <150)
VLDL: 19 mg/dL (ref 0–40)

## 2021-06-08 LAB — GLUCOSE, CAPILLARY
Glucose-Capillary: 99 mg/dL (ref 70–99)
Glucose-Capillary: 97 mg/dL (ref 70–99)
Glucose-Capillary: 137 mg/dL — ABNORMAL HIGH (ref 70–99)
Glucose-Capillary: 157 mg/dL — ABNORMAL HIGH (ref 70–99)

## 2021-06-08 MED ORDER — SODIUM CHLORIDE 0.9 % IV SOLN
3.0000 g | Freq: Four times a day (QID) | INTRAVENOUS | Status: DC
  Administered 2021-06-08 – 2021-06-10 (×8): 3 g via INTRAVENOUS
  Filled 2021-06-08 (×10): qty 8

## 2021-06-08 NOTE — Plan of Care (Signed)

## 2021-06-08 NOTE — Progress Notes (Signed)
?PROGRESS NOTE ? ? ? ?Donald Richardson  SNK:[id number] DOB: [birth year] DOA: 06/07/2021 ?PCP: Denita Lung, MD  ? ? ?Brief Narrative:  ?Donald Richardson, a 81 y/o man with h/o Parkinson's disease, cerebrel small vessel disease, CKD III, combined heart failure, HLD and DM 2 in remission presents with a report of BRBPR. He has no prior h/o GI problems in Epic except colonoscopy in 2009 with diverticula and 1 sessile polyp. Hgb stable.  He had evidence of inflammatory collitis on CT A/P. INcidental finding of 5.9x5.4x7.4 infrarenal aortic aneurysm.  Also with aspiration pneumonia.  ? ? ?Assessment and Plan: ?* BRBPR (bright red blood per rectum) ?Patient w/ h/o diverticula ?- Painless bleed. Suspect diverticular bleed. ?-For recurrent episodes or drop in Hgb or sign of infection GI consult ?  ? ?Pulmonary infiltrate in left lung on CXR ?-appears to be aspiration ?-add unasyn ?-consult SLP ? ?Infrarenal abdominal aortic aneurysm (AAA) without rupture ([place]) ?Incidental finding on CT A/P - relatively large: 5.9x5.4x7.4. Seen by vascular surgery - Dr. Virl Cagey. For outpatient follow up to discuss intervention. ? ?Parkinson's disease ([place]) ?Follows with neurology. Has been stable. ?-Continue home regimen ? ? ?CKD (chronic kidney disease), stage III ([place]) ?-daily labs ? ?Hypertension ?-Continue home meds ? ?Type 2 diabetes mellitus in remission ([place]) ?-SSI ? ?Hyperlipidemia ?-Continue home regimen ? ? ? ? ? ? ? ? ? ?DVT prophylaxis: Place TED hose Start: 06/07/21 1848 ? ?  Code Status: DNR- unable to see ACP documents ?Family Communication:  ? ?Disposition Plan:  ?Level of care: Med-Surg ?Status is: Observation ?The patient will require care spanning > 2 midnights and should be moved to inpatient because: aspirating  ?  ? ?Consultants:  ?SLP ? ? ?Subjective: ?No further bleeding, diarrhea ? ?Objective: ?Vitals:  ? 06/08/21 0320 06/08/21 0321 06/08/21 0853 06/08/21 1118  ?BP:  (!) 152/88 (!) 161/78 (!) 155/96  ?Pulse:  69 (!) 59 65  ?Resp:   '19 17 16  '$ ?Temp:  99 ?F (37.2 ?C) 98.7 ?F (37.1 ?C) 98.8 ?F (37.1 ?C)  ?TempSrc:  Oral Oral Oral  ?SpO2:  95% 94% 93%  ?Weight: 62.8 kg     ?Height: '5\' 8"'$  (1.727 m)     ? ? ?Intake/Output Summary (Last 24 hours) at 06/08/2021 1337 ?Last data filed at 06/08/2021 1031 ?Gross per 24 hour  ?Intake 500 ml  ?Output 300 ml  ?Net 200 ml  ? ?Filed Weights  ? 06/07/21 1322 06/08/21 0320  ?Weight: 63.5 kg 62.8 kg  ? ? ?Examination: ? ? ?General: Appearance:    elderly male in no acute distress  ?   ?Lungs:     respirations unlabored  ?Heart:    Normal heart rate.  ?  ?MS:   All extremities are intact.  ?  ?Neurologic:   Awake, alert  ?  ? ? ? ?Data Reviewed: I have personally reviewed following labs and imaging studies ? ?CBC: ?Recent Labs  ?Lab 06/07/21 ?1333 06/07/21 ?2159 06/08/21 ?0327 06/08/21 ?1012  ?WBC 11.9*  --   --   --   ?NEUTROABS 9.8*  --   --   --   ?HGB 15.1 13.3 12.9* 12.9*  ?HCT 47.0 42.2 39.5 39.5  ?MCV 90.9  --   --   --   ?PLT 144*  --   --   --   ? ?Basic Metabolic Panel: ?Recent Labs  ?Lab 06/07/21 ?1333  ?NA 139  ?K 4.0  ?CL 107  ?CO2 24  ?GLUCOSE 126*  ?  BUN 16  ?CREATININE 1.45*  ?CALCIUM 8.8*  ? ?GFR: ?Estimated Creatinine Clearance: 36.7 mL/min (A) (by C-G formula based on SCr of 1.45 mg/dL (H)). ?Liver Function Tests: ?No results for input(s): AST, ALT, ALKPHOS, BILITOT, PROT, ALBUMIN in the last 168 hours. ?No results for input(s): LIPASE, AMYLASE in the last 168 hours. ?No results for input(s): AMMONIA in the last 168 hours. ?Coagulation Profile: ?No results for input(s): INR, PROTIME in the last 168 hours. ?Cardiac Enzymes: ?No results for input(s): CKTOTAL, CKMB, CKMBINDEX, TROPONINI in the last 168 hours. ?BNP (last 3 results) ?No results for input(s): PROBNP in the last 8760 hours. ?HbA1C: ?Recent Labs  ?  06/07/21 ?2159  ?HGBA1C 6.3*  ? ?CBG: ?Recent Labs  ?Lab 06/08/21 ?0625 06/08/21 ?1115  ?GLUCAP 99 97  ? ?Lipid Profile: ?Recent Labs  ?  06/08/21 ?0327  ?CHOL 86  ?HDL 32*  ?[place] 35  ?TRIG  96  ?CHOLHDL 2.7  ? ?Thyroid Function Tests: ?No results for input(s): TSH, T4TOTAL, FREET4, T3FREE, THYROIDAB in the last 72 hours. ?Anemia Panel: ?No results for input(s): VITAMINB12, FOLATE, FERRITIN, TIBC, IRON, RETICCTPCT in the last 72 hours. ?Sepsis Labs: ?Recent Labs  ?Lab 06/07/21 ?1640 06/07/21 ?2159  ?PROCALCITON  --  <0.10  ?LATICACIDVEN 0.8  --   ? ? ?No results found for this or any previous visit (from the past 240 hour(s)).  ? ? ? ? ? ?Radiology Studies: ?DG Chest 2 View ? ?Result Date: 06/08/2021 ?CLINICAL DATA:  Infiltrate on previous study. History of COPD, diabetes, hypertension. Former smoker. EXAM: CHEST - 2 VIEW COMPARISON:  Chest x-rays dated 03/03/2020 and 08/20/2016. Chest CT dated 09/02/2012. FINDINGS: Heart size is upper normal, stable. Confluent opacity is seen within the retrocardiac LEFT lower lobe, likely increased compared to previous exams, possibly related to the asymmetrically prominent bronchiectasis demonstrated in the LEFT lower lobe on chest CT of 09/02/2012. No new lung abnormality. Emphysematous changes noted within the upper lobes. No pleural effusion or pneumothorax is seen. No acute-appearing osseous abnormality. Mild degenerative spondylosis of the kyphotic thoracic spine. IMPRESSION: 1. 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 Superimposed pneumonia not excluded. Consider chest CT for more definitive characterization. 2. Emphysema. Electronically Signed   By: Franki Cabot M.D.   On: 06/08/2021 08:25  ? ?CT CHEST WO CONTRAST ? ?Result Date: 06/08/2021 ?CLINICAL DATA:  Abnormal chest radiograph EXAM: CT CHEST WITHOUT CONTRAST TECHNIQUE: Multidetector CT imaging of the chest was performed following the standard protocol without IV contrast. RADIATION DOSE REDUCTION: This exam  was performed according to the departmental dose-optimization program which includes automated exposure control, adjustment of the mA and/or kV according to patient size and/or use of iterative reconstruction technique. COMPARISON:  Same day chest radiograph, CT abdomen pelvis, 06/07/2021, CT chest, 09/02/2012 FINDINGS: Cardiovascular: Aortic atherosclerosis. Cardiomegaly. Three-vessel coronary artery calcifications and stents. Enlargement of the main pulmonary artery measuring up to 3.9 cm in caliber. No pericardial effusion. Mediastinum/Nodes: No enlarged mediastinal, hilar, or axillary lymph nodes. Thyroid gland, trachea, and esophagus demonstrate no significant findings. Lungs/Pleura: Severe centrilobular emphysema. Diffuse bilateral bronchial wall thickening. Heterogeneous airspace opacity and bronchiectasis the dependent left lower lobe, increased compared to remote prior CT dated 09/02/2012. No pleural effusion or pneumothorax. Upper Abdomen: No acute abnormality. Musculoskeletal: No chest wall abnormality. No suspicious osseous lesions identified. IMPRESSION: 1. Heterogeneous airspace opacity and bronchiectasis  the dependent left lower lobe, increased compared to remote prior CT dated 09/02/2012. Findings are consistent with aspiration, likely ongoing. 2. Severe emphysema and diffuse bilateral bronchial wall thickening. 3. Cardiomegaly and coronary artery disease. 4. Enlargement of the main pulmonary artery, as can be seen in pulmonary hypertension. Aortic Atherosclerosis (ICD10-I70.0) and Emphysema (ICD10-J43.9). Electronically Signed   By: Delanna Ahmadi M.D.   On: 06/08/2021 12:11  ? ?CT ABDOMEN PELVIS W CONTRAST ? ?Result Date: 06/07/2021 ?CLINICAL DATA:  Abdominal pain, rectal bleeding EXAM: CT ABDOMEN AND PELVIS WITH CONTRAST TECHNIQUE: Multidetector CT imaging of the abdomen and pelvis was performed using the standard protocol following bolus administration of intravenous contrast. RADIATION DOSE  REDUCTION: This exam was performed according to the departmental dose-optimization program which includes automated exposure control, adjustment of the mA and/or kV according to patient size and/or use of it

## 2021-06-08 NOTE — Progress Notes (Signed)
Pharmacy Antibiotic Note ? ?Donald Richardson is a 81 y.o. male admitted on 06/07/2021 with  suspected aspiration pneumonia .  Pharmacy has been consulted for Unasyn dosing. Patient presented with bleeding per rectum. Radiographic pneumonia is an incidental finding. WBC is elevated at 11.9. Patient is afebrile. Patient is on room air. Will follow repeat imaging and clinical progress. ? ?Plan: ?Start Unasyn 3g every 6 hours ?Dose adjust if CrCl <30 ?Deescalate as appropriate ? ?Height: '5\' 8"'$  (172.7 cm) ?Weight: 62.8 kg (138 lb 7.2 oz) ?IBW/kg (Calculated) : 68.4 ? ?Temp (24hrs), Avg:98.7 ?F (37.1 ?C), Min:98.2 ?F (36.8 ?C), Max:99 ?F (37.2 ?C) ? ?Recent Labs  ?Lab 06/07/21 ?1333 06/07/21 ?1640  ?WBC 11.9*  --   ?CREATININE 1.45*  --   ?LATICACIDVEN  --  0.8  ?  ?Estimated Creatinine Clearance: 36.7 mL/min (A) (by C-G formula based on SCr of 1.45 mg/dL (H)).   ? ?No Known Allergies ? ? ?Thank you for allowing pharmacy to be a part of this patient?s care. ? ?Ursula Beath ?06/08/2021 1:27 PM ? ?

## 2021-06-08 NOTE — Progress Notes (Signed)
Admitted from [facility] ED accompanied by RN,alert and oriented,pt oriented to room and staff,attached to continous cardiac monitoring,V/S checked CCMD nottified. Will continue to monitor. ?

## 2021-06-08 NOTE — Hospital Course (Addendum)
Donald Richardson, a 81 y/o man with h/o Parkinson's disease, cerebrel small vessel disease, CKD III, combined heart failure, HLD and DM 2 in remission presents with a report of BRBPR. He has no prior h/o GI problems in Epic except colonoscopy in 2009 with diverticula and 1 sessile polyp. Hgb stable.  He had evidence of inflammatory collitis on CT A/P. INcidental finding of 5.9x5.4x7.4 infrarenal aortic aneurysm.  Also with aspiration pneumonia.  MBS w/o overt aspiration.  PT recommends H/H at ALF ?

## 2021-06-08 NOTE — ED Notes (Signed)
Pt care taken, no complaints at this time. 

## 2021-06-08 NOTE — Evaluation (Signed)
Clinical/Bedside Swallow Evaluation ?Patient Details  ?Name: Donald Richardson ?MRN: [id number] ?Date of Birth: [birth year] ? ?Today's Date: 06/08/2021 ?Time: SLP Start Time (ACUTE ONLY): 3151 SLP Stop Time (ACUTE ONLY): 1550 ?SLP Time Calculation (min) (ACUTE ONLY): 20 min ? ?Past Medical History:  ?Past Medical History:  ?Diagnosis Date  ? ASHD (arteriosclerotic heart disease)   ? STENT  ? COPD (chronic obstructive pulmonary disease) ([place])   ? Diabetes mellitus   ? Hgb A1C- 5.8 in May 2017  ? Hemorrhoids   ? Hyperlipidemia   ? Hypertension   ? Parkinson disease ([place])   ? Smoker   ? ?Past Surgical History:  ?Past Surgical History:  ?Procedure Laterality Date  ? Coronary artery stenting    ? INGUINAL HERNIA REPAIR Left 10/26/2015  ? Procedure: LEFT INGUINAL HERNIA REPAIR WITH MESH;  Surgeon: Jackolyn Confer, MD;  Location: [facility];  Service: General;  Laterality: Left;  ? INSERTION OF MESH Left 10/26/2015  ? Procedure: INSERTION OF MESH;  Surgeon: Jackolyn Confer, MD;  Location: [facility];  Service: General;  Laterality: Left;  ? ?HPI:  ?81 y/o man presents with a report of BRBPR, pulmonary infiltrate in left lung that imaging reports as appearing to be related to chronic aspiration. Hx of Parkinson's disease, cerebrel small vessel disease, CKD III, combined heart failure, HLD and DM 2 in remission. He has no prior h/o GI problems in Epic except colonoscopy in 2009 with diverticula and 1 sessile polyp. No hx of swallowing assessments in [facility] records.  ?  ?Assessment / Plan / Recommendation  ?Clinical Impression ? Pt participated in clinical swallowing evaluation. He denied hx of swallowing deficits. Oral mechanism exam was normal.  He was quite HOH; HA in place.  All tested consistencies (purees, solids, thin liquids) led to either oral holding or a pharyngeal delay in swallow initiation - unable to discern at bedside. There was one incident of coughing after liquid over the course of multiple swallows.  He acknowledged that "When  I try to swallow, it can be harder to swallow."  GIven his hx of Parkinson's, imaging of chest, and equivocal findings on bedside evaluation, it may be beneficial to proceed with MBS.  Recommend continuing current diet - regular, thin liquids.  Radiology's schedule may prohibit study being done tomorrow - will plan for Monday if necessary. ?SLP Visit Diagnosis: Dysphagia, unspecified (R13.10) ?   ? ?    ?  ?Diet Recommendation   Continue current regular diet, thin liquids ? ?Medication Administration: Whole meds with liquid  ?  ?Other  Recommendations Oral Care Recommendations: Oral care BID   ? ?Recommendations for follow up therapy are one component of a multi-disciplinary discharge planning process, led by the attending physician.  Recommendations may be updated based on patient status, additional functional criteria and insurance authorization. ? ?Follow up Recommendations   tba ? ? ?  ? ? ?Swallow Study   ?General HPI: 81 y/o man presents with a report of BRBPR, pulmonary infiltrate in left lung that imaging reports as appearing to be related to chronic aspiration. Hx of Parkinson's disease, cerebrel small vessel disease, CKD III, combined heart failure, HLD and DM 2 in remission. He has no prior h/o GI problems in Epic except colonoscopy in 2009 with diverticula and 1 sessile polyp. No hx of swallowing assessments in [facility] records. ?Type of Study: Bedside Swallow Evaluation ?Previous Swallow Assessment: no ?Diet Prior to this Study: Regular;Thin liquids ?Temperature Spikes Noted: No ?Respiratory Status: Room air ?History of  Recent Intubation: No ?Behavior/Cognition: Alert;Cooperative ?Oral Cavity Assessment: Within Functional Limits ?Oral Care Completed by SLP: Recent completion by staff ?Oral Cavity - Dentition: Adequate natural dentition ?Vision: Functional for self-feeding ?Self-Feeding Abilities: Able to feed self ?Patient Positioning: Upright in bed ?Baseline Vocal Quality: Normal ?Volitional Cough: Strong   ?  ?Oral/Motor/Sensory Function Overall Oral Motor/Sensory Function: Within functional limits   ?Ice Chips Ice chips: Within functional limits   ?Thin Liquid Thin Liquid: Impaired ?Presentation: Cup;Straw ?Oral Phase Functional Implications: Oral holding ?Pharyngeal  Phase Impairments: Suspected delayed Swallow  ?  ?Nectar Thick Nectar Thick Liquid: Not tested   ?Honey Thick Honey Thick Liquid: Not tested   ?Puree Puree: Impaired ?Presentation: Self Fed;Spoon ?Oral Phase Functional Implications: Oral holding ?Pharyngeal Phase Impairments: Suspected delayed Swallow   ?Solid ? ? ?  Solid: Impaired ?Presentation: Self Fed ?Oral Phase Functional Implications: Oral holding ?Pharyngeal Phase Impairments: Suspected delayed Swallow  ? ?  ? ?Donald Richardson ?06/08/2021,3:56 PM ? ?[name], MA CCC/SLP ?Acute Rehabilitation Services ?Office number [phone] ?Pager [phone] ? ? ?

## 2021-06-09 DIAGNOSIS — K625 Hemorrhage of anus and rectum: Secondary | ICD-10-CM | POA: Diagnosis not present

## 2021-06-09 LAB — CBC
HCT: 35.1 % — ABNORMAL LOW (ref 39.0–52.0)
Hemoglobin: 11.6 g/dL — ABNORMAL LOW (ref 13.0–17.0)
MCH: 29.6 pg (ref 26.0–34.0)
MCHC: 33 g/dL (ref 30.0–36.0)
MCV: 89.5 fL (ref 80.0–100.0)
Platelets: 110 10*3/uL — ABNORMAL LOW (ref 150–400)
RBC: 3.92 MIL/uL — ABNORMAL LOW (ref 4.22–5.81)
RDW: 14.6 % (ref 11.5–15.5)
WBC: 11.3 10*3/uL — ABNORMAL HIGH (ref 4.0–10.5)
nRBC: 0 % (ref 0.0–0.2)

## 2021-06-09 LAB — BASIC METABOLIC PANEL
Anion gap: 6 (ref 5–15)
BUN: 11 mg/dL (ref 8–23)
CO2: 24 mmol/L (ref 22–32)
Calcium: 7.8 mg/dL — ABNORMAL LOW (ref 8.9–10.3)
Chloride: 106 mmol/L (ref 98–111)
Creatinine, Ser: 1.27 mg/dL — ABNORMAL HIGH (ref 0.61–1.24)
GFR, Estimated: 57 mL/min — ABNORMAL LOW (ref >60)
Glucose, Bld: 104 mg/dL — ABNORMAL HIGH (ref 70–99)
Potassium: 3.9 mmol/L (ref 3.5–5.1)
Sodium: 136 mmol/L (ref 135–145)

## 2021-06-09 LAB — GLUCOSE, CAPILLARY
Glucose-Capillary: 89 mg/dL (ref 70–99)
Glucose-Capillary: 130 mg/dL — ABNORMAL HIGH (ref 70–99)
Glucose-Capillary: 194 mg/dL — ABNORMAL HIGH (ref 70–99)
Glucose-Capillary: 104 mg/dL — ABNORMAL HIGH (ref 70–99)

## 2021-06-09 NOTE — Evaluation (Signed)
Physical Therapy Evaluation ?Patient Details ?Name: Donald Richardson ?MRN: [id number] ?DOB: 10-25-1940 ?Today's Date: 06/09/2021 ? ?History of Present Illness ? Pt is a 81 y.o. M who presents with report of BRBPR. CT A/P incidental finding of 5.9x5.4x7.4 infrarenal aortic aneurysm. Also with aspiration PNA. Plan for MBS on Monday. Significant PMH: Parkinson disease, cerebral small vessel disease, CKD III, combined heart failure, HLD, DM2.  ?Clinical Impression ? PTA, pt lives at [place], is modI with w/c transfers and requires assist with some ADL's. Pt presents with generalized weakness, impaired sitting balance, and decreased activity tolerance. Pt requiring moderate assist for bed mobility and transferring from bed to chair with minimal assist. BP 153/81, HR 53. Pt denies dizziness/lightheadedness. Will continue to follow acutely. ?   ? ?Recommendations for follow up therapy are one component of a multi-disciplinary discharge planning process, led by the attending physician.  Recommendations may be updated based on patient status, additional functional criteria and insurance authorization. ? ?Follow Up Recommendations Home health PT (at [place]) ? ?  ?Assistance Recommended at Discharge Frequent or constant Supervision/Assistance  ?Patient can return home with the following ? A little help with walking and/or transfers;A little help with bathing/dressing/bathroom ? ?  ?Equipment Recommendations None recommended by PT  ?Recommendations for Other Services ?    ?  ?Functional Status Assessment Patient has had a recent decline in their functional status and demonstrates the ability to make significant improvements in function in a reasonable and predictable amount of time.  ? ?  ?Precautions / Restrictions Precautions ?Precautions: Fall ?Restrictions ?Weight Bearing Restrictions: No  ? ?  ? ?Mobility ? Bed Mobility ?Overal bed mobility: Needs Assistance ?Bed Mobility: Supine to Sit ?  ?  ?Supine to sit:  Mod assist ?  ?  ?General bed mobility comments: Cues for initiation, assist for truncal elevation and use of bed pad to scoot hips anteriorly ?  ? ?Transfers ?Overall transfer level: Needs assistance ?Equipment used: None ?Transfers: Sit to/from Stand, Bed to chair/wheelchair/BSC ?Sit to Stand: Min assist ?Stand pivot transfers: Min assist ?  ?  ?  ?  ?General transfer comment: Light minA to rise and pivot towards left to recliner, cues for hand placement and sequencing. Increased time ?  ? ?Ambulation/Gait ?  ?  ?  ?  ?  ?  ?  ?  ? ?Stairs ?  ?  ?  ?  ?  ? ?Wheelchair Mobility ?  ? ?Modified Rankin (Stroke Patients Only) ?  ? ?  ? ?Balance Overall balance assessment: Needs assistance ?Sitting-balance support: Feet supported, Single extremity supported ?Sitting balance-Leahy Scale: Poor ?Sitting balance - Comments: reliant on single UE support ?  ?Standing balance support: Bilateral upper extremity supported ?Standing balance-Leahy Scale: Poor ?  ?  ?  ?  ?  ?  ?  ?  ?  ?  ?  ?  ?   ? ? ? ?Pertinent Vitals/Pain Pain Assessment ?Pain Assessment: No/denies pain  ? ? ?Home Living Family/patient expects to be discharged to:: Assisted living ?  ?  ?  ?  ?  ?  ?  ?  ?Home Equipment: Wheelchair - Publishing copy (2 wheels) ?Additional Comments: [place]  ?  ?Prior Function Prior Level of Function : Needs assist ?  ?  ?  ?  ?  ?  ?Mobility Comments: uses manual w/c, modI stand pivot transfers, nonambulatory ?ADLs Comments: staff assists with bathing, pt can dress self ?  ? ? ?  Hand Dominance  ?   ? ?  ?Extremity/Trunk Assessment  ? Upper Extremity Assessment ?Upper Extremity Assessment: Generalized weakness ?  ? ?Lower Extremity Assessment ?Lower Extremity Assessment: Generalized weakness ?  ? ?Cervical / Trunk Assessment ?Cervical / Trunk Assessment: Kyphotic  ?Communication  ? Communication: HOH  ?Cognition Arousal/Alertness: Awake/alert ?Behavior During Therapy: [facility] for tasks assessed/performed ?Overall  Cognitive Status: No family/caregiver present to determine baseline cognitive functioning ?  ?  ?  ?  ?  ?  ?  ?  ?  ?  ?  ?  ?  ?  ?  ?  ?General Comments: Able to follow basic commands ?  ?  ? ?  ?General Comments   ? ?  ?Exercises    ? ?Assessment/Plan  ?  ?PT Assessment Patient needs continued PT services  ?PT Problem List Decreased strength;Decreased activity tolerance;Decreased balance;Decreased mobility ? ?   ?  ?PT Treatment Interventions DME instruction;Functional mobility training;Therapeutic activities;Therapeutic exercise;Balance training;Patient/family education;Wheelchair mobility training   ? ?PT Goals (Current goals can be found in the Care Plan section)  ?Acute Rehab PT Goals ?Patient Stated Goal: get stronger ?PT Goal Formulation: With patient ?Time For Goal Achievement: 06/23/21 ?Potential to Achieve Goals: Good ?Additional Goals ?Additional Goal #1: Pt will propel w/c with BUE's x 100 ft with minA ? ?  ?Frequency Min 3X/week ?  ? ? ?Co-evaluation   ?  ?  ?  ?  ? ? ?  ?AM-PAC PT "6 Clicks" Mobility  ?Outcome Measure Help needed turning from your back to your side while in a flat bed without using bedrails?: A Lot ?Help needed moving from lying on your back to sitting on the side of a flat bed without using bedrails?: A Lot ?Help needed moving to and from a bed to a chair (including a wheelchair)?: A Little ?Help needed standing up from a chair using your arms (e.g., wheelchair or bedside chair)?: A Little ?Help needed to walk in hospital room?: Total ?Help needed climbing 3-5 steps with a railing? : Total ?6 Click Score: 12 ? ?  ?End of Session Equipment Utilized During Treatment: Gait belt ?Activity Tolerance: Patient tolerated treatment well ?Patient left: in chair;with call bell/phone within reach ?Nurse Communication: Mobility status ?PT Visit Diagnosis: Muscle weakness (generalized) (M62.81);Unsteadiness on feet (R26.81) ?  ? ?Time: 7628-3151 ?PT Time Calculation (min) (ACUTE ONLY): 25  min ? ? ?Charges:   PT Evaluation ?$PT Eval Moderate Complexity: 1 Mod ?PT Treatments ?$Therapeutic Activity: 8-22 mins ?  ?   ? ? ?Wyona Almas, PT, DPT ?Acute Rehabilitation Services ?Pager [phone] ?Office [phone] ? ? ?Deno Etienne ?06/09/2021, 4:23 PM ? ?

## 2021-06-09 NOTE — Progress Notes (Signed)
?PROGRESS NOTE ? ? ? ?Donald Richardson  DPO:[id number] DOB: Apr 13, 1940 DOA: 06/07/2021 ?PCP: Donald Richardson, Donald Richardson  ? ? ?Brief Narrative:  ?Donald Richardson, a 82 y/o man with h/o Parkinson's disease, cerebrel small vessel disease, CKD III, combined heart failure, HLD and DM 2 in remission presents with a report of BRBPR. He has no prior h/o GI problems in Epic except colonoscopy in 2009 with diverticula and 1 sessile polyp. Hgb stable.  He had evidence of inflammatory collitis on CT A/P. INcidental finding of 5.9x5.4x7.4 infrarenal aortic aneurysm.  Also with aspiration pneumonia.  Plans for MBS on Monday  ? ? ?Assessment and Plan: ?* BRBPR (bright red blood per rectum) ?Patient w/ h/o diverticula ?- Painless bleed. Suspect diverticular bleed. ?-appears resolved ?-For recurrent episodes or drop in Hgb or sign of infection GI consult ?  ? ?Pulmonary infiltrate in left Richardson on CXR ?-appears to be aspiration ?-add unasyn ?-consult SLP for MBS ? ?Infrarenal abdominal aortic aneurysm (AAA) without rupture ([place]) ?Incidental finding on CT A/P - relatively large: 5.9x5.4x7.4. Seen by vascular surgery - Donald Richardson. For outpatient follow up to discuss intervention. ? ?Parkinson's disease ([place]) ?Follows with neurology. Has been stable. ?-Continue home regimen ? ? ?CKD (chronic kidney disease), stage III ([place]) ?-daily labs ? ?Hypertension ?-Continue home meds ? ?Type 2 diabetes mellitus in remission ([place]) ?-SSI ? ?Hyperlipidemia ?-Continue home regimen ? ? ? ? ? ? ? ? ? ?DVT prophylaxis: Place TED hose Start: 06/07/21 1848 ? ?  Code Status: DNR- unable to see ACP documents ?Family Communication:  ? ?Disposition Plan:  ?Level of care: Med-Surg ?Status is: inpt ?  ? ?Consultants:  ?SLP ? ? ?Subjective: ?Wants to rest in bed today ?From [place] ? ?Objective: ?Vitals:  ? 06/08/21 2308 06/08/21 2327 06/09/21 0323 06/09/21 0803  ?BP: (!) 177/92 (!) 152/73 138/71 (!) 146/93  ?Pulse: (!) 50 (!) 55 (!) 58 (!) 59  ?Resp: '17 17 16 16  '$ ?Temp: 98.7  ?F (37.1 ?C)  97.6 ?F (36.4 ?C) 98.7 ?F (37.1 ?C)  ?TempSrc: Oral  Axillary Oral  ?SpO2: 94% 94% 91% 95%  ?Weight:      ?Height:      ? ? ?Intake/Output Summary (Last 24 hours) at 06/09/2021 1203 ?Last data filed at 06/09/2021 0703 ?Gross per 24 hour  ?Intake 540.27 ml  ?Output 850 ml  ?Net -309.73 ml  ? ?Filed Weights  ? 06/07/21 1322 06/08/21 0320  ?Weight: 63.5 kg 62.8 kg  ? ? ?Examination: ? ? ? ?General: Appearance:    elderlymale in no acute distress  ?   ?Lungs:      respirations unlabored  ?Heart:    Bradycardic. Normal rhythm. No murmurs, rubs, or gallops.   ?MS:   All extremities are intact.  ?  ?Neurologic:   Awake, alert, hard of hearing  ?  ?  ? ? ? ?Data Reviewed: I have personally reviewed following labs and imaging studies ? ?CBC: ?Recent Labs  ?Lab 06/07/21 ?1333 06/07/21 ?2159 06/08/21 ?0327 06/08/21 ?1012 06/09/21 ?0147  ?WBC 11.9*  --   --   --  11.3*  ?NEUTROABS 9.8*  --   --   --   --   ?HGB 15.1 13.3 12.9* 12.9* 11.6*  ?HCT 47.0 42.2 39.5 39.5 35.1*  ?MCV 90.9  --   --   --  89.5  ?PLT 144*  --   --   --  110*  ? ?Basic Metabolic Panel: ?Recent Labs  ?Lab 06/07/21 ?  1333 06/09/21 ?0147  ?NA 139 136  ?K 4.0 3.9  ?CL 107 106  ?CO2 24 24  ?GLUCOSE 126* 104*  ?BUN 16 11  ?CREATININE 1.45* 1.27*  ?CALCIUM 8.8* 7.8*  ? ?GFR: ?Estimated Creatinine Clearance: 41.9 mL/min (A) (by C-G formula based on SCr of 1.27 mg/dL (H)). ?Liver Function Tests: ?No results for input(s): AST, ALT, ALKPHOS, BILITOT, PROT, ALBUMIN in the last 168 hours. ?No results for input(s): LIPASE, AMYLASE in the last 168 hours. ?No results for input(s): AMMONIA in the last 168 hours. ?Coagulation Profile: ?No results for input(s): INR, PROTIME in the last 168 hours. ?Cardiac Enzymes: ?No results for input(s): CKTOTAL, CKMB, CKMBINDEX, TROPONINI in the last 168 hours. ?BNP (last 3 results) ?No results for input(s): PROBNP in the last 8760 hours. ?HbA1C: ?Recent Labs  ?  06/07/21 ?2159  ?HGBA1C 6.3*  ? ?CBG: ?Recent Labs  ?Lab  06/08/21 ?0625 06/08/21 ?1115 06/08/21 ?1624 06/08/21 ?2040 06/09/21 ?5621  ?GLUCAP 99 97 137* 157* 89  ? ?Lipid Profile: ?Recent Labs  ?  06/08/21 ?0327  ?CHOL 86  ?HDL 32*  ?[place] 35  ?TRIG 96  ?CHOLHDL 2.7  ? ?Thyroid Function Tests: ?No results for input(s): TSH, T4TOTAL, FREET4, T3FREE, THYROIDAB in the last 72 hours. ?Anemia Panel: ?No results for input(s): VITAMINB12, FOLATE, FERRITIN, TIBC, IRON, RETICCTPCT in the last 72 hours. ?Sepsis Labs: ?Recent Labs  ?Lab 06/07/21 ?1640 06/07/21 ?2159  ?PROCALCITON  --  <0.10  ?LATICACIDVEN 0.8  --   ? ? ?No results found for this or any previous visit (from the past 240 hour(s)).  ? ? ? ? ? ?Radiology Studies: ?DG Chest 2 View ? ?Result Date: 06/08/2021 ?CLINICAL DATA:  Infiltrate on previous study. History of COPD, diabetes, hypertension. Former smoker. EXAM: CHEST - 2 VIEW COMPARISON:  Chest x-rays dated 03/03/2020 and 08/20/2016. Chest CT dated 09/02/2012. FINDINGS: Heart size is upper normal, stable. Confluent opacity is seen within the retrocardiac LEFT lower lobe, likely increased compared to previous exams, possibly related to the asymmetrically prominent bronchiectasis demonstrated in the LEFT lower lobe on chest CT of 09/02/2012. No new Richardson abnormality. Emphysematous changes noted within the upper lobes. No pleural effusion or pneumothorax is seen. No acute-appearing osseous abnormality. Mild degenerative spondylosis of the kyphotic thoracic spine. IMPRESSION: 1. 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 Superimposed pneumonia not excluded. Consider chest CT for more definitive characterization. 2. Emphysema. Electronically Signed   By: Franki Cabot M.D.   On: 06/08/2021 08:25  ? ?CT CHEST WO CONTRAST ? ?Result Date: 06/08/2021 ?CLINICAL DATA:  Abnormal chest  radiograph EXAM: CT CHEST WITHOUT CONTRAST TECHNIQUE: Multidetector CT imaging of the chest was performed following the standard protocol without IV contrast. RADIATION DOSE REDUCTION: This exam was performed according to the departmental dose-optimization program which includes automated exposure control, adjustment of the mA and/or kV according to patient size and/or use of iterative reconstruction technique. COMPARISON:  Same day chest radiograph, CT abdomen pelvis, 06/07/2021, CT chest, 09/02/2012 FINDINGS: Cardiovascular: Aortic atherosclerosis. Cardiomegaly. Three-vessel coronary artery calcifications and stents. Enlargement of the main pulmonary artery measuring up to 3.9 cm in caliber. No pericardial effusion. Mediastinum/Nodes: No enlarged mediastinal, hilar, or axillary lymph nodes. Thyroid gland, trachea, and esophagus demonstrate no significant findings. Lungs/Pleura: Severe centrilobular emphysema. Diffuse bilateral bronchial wall thickening. Heterogeneous airspace opacity and bronchiectasis the dependent left lower  lobe, increased compared to remote prior CT dated 09/02/2012. No pleural effusion or pneumothorax. Upper Abdomen: No acute abnormality. Musculoskeletal: No chest wall abnormality. No suspicious osseous lesions identified. IMPRESSION: 1. Heterogeneous airspace opacity and bronchiectasis the dependent left lower lobe, increased compared to remote prior CT dated 09/02/2012. Findings are consistent with aspiration, likely ongoing. 2. Severe emphysema and diffuse bilateral bronchial wall thickening. 3. Cardiomegaly and coronary artery disease. 4. Enlargement of the main pulmonary artery, as can be seen in pulmonary hypertension. Aortic Atherosclerosis (ICD10-I70.0) and Emphysema (ICD10-J43.9). Electronically Signed   By: Delanna Ahmadi M.D.   On: 06/08/2021 12:11  ? ?CT ABDOMEN PELVIS W CONTRAST ? ?Result Date: 06/07/2021 ?CLINICAL DATA:  Abdominal pain, rectal bleeding EXAM: CT ABDOMEN AND PELVIS  WITH CONTRAST TECHNIQUE: Multidetector CT imaging of the abdomen and pelvis was performed using the standard protocol following bolus administration of intravenous contrast. RADIATION DOSE REDUCTION: This exam w

## 2021-06-09 NOTE — Progress Notes (Signed)
Pt had episode of serosanguineous thin diarrhea,in bed. Very small fully formed stool was able to recover, doesn't seem like good sample for cdiff. Its by sink. Day shift RN aware. Will continue to monitor.  ?

## 2021-06-10 ENCOUNTER — Inpatient Hospital Stay (HOSPITAL_COMMUNITY): Payer: Medicare Other

## 2021-06-10 DIAGNOSIS — Z515 Encounter for palliative care: Secondary | ICD-10-CM

## 2021-06-10 DIAGNOSIS — Z7189 Other specified counseling: Secondary | ICD-10-CM

## 2021-06-10 DIAGNOSIS — R531 Weakness: Secondary | ICD-10-CM

## 2021-06-10 DIAGNOSIS — G20 Parkinson's disease: Secondary | ICD-10-CM

## 2021-06-10 DIAGNOSIS — Z789 Other specified health status: Secondary | ICD-10-CM

## 2021-06-10 DIAGNOSIS — Z66 Do not resuscitate: Secondary | ICD-10-CM

## 2021-06-10 DIAGNOSIS — I7143 Infrarenal abdominal aortic aneurysm, without rupture: Secondary | ICD-10-CM

## 2021-06-10 DIAGNOSIS — E119 Type 2 diabetes mellitus without complications: Secondary | ICD-10-CM

## 2021-06-10 DIAGNOSIS — I5043 Acute on chronic combined systolic (congestive) and diastolic (congestive) heart failure: Secondary | ICD-10-CM | POA: Diagnosis not present

## 2021-06-10 DIAGNOSIS — R1310 Dysphagia, unspecified: Secondary | ICD-10-CM

## 2021-06-10 DIAGNOSIS — N1831 Chronic kidney disease, stage 3a: Secondary | ICD-10-CM

## 2021-06-10 DIAGNOSIS — K625 Hemorrhage of anus and rectum: Secondary | ICD-10-CM | POA: Diagnosis not present

## 2021-06-10 DIAGNOSIS — R918 Other nonspecific abnormal finding of lung field: Secondary | ICD-10-CM

## 2021-06-10 DIAGNOSIS — K529 Noninfective gastroenteritis and colitis, unspecified: Secondary | ICD-10-CM

## 2021-06-10 LAB — GLUCOSE, CAPILLARY
Glucose-Capillary: 87 mg/dL (ref 70–99)
Glucose-Capillary: 143 mg/dL — ABNORMAL HIGH (ref 70–99)
Glucose-Capillary: 123 mg/dL — ABNORMAL HIGH (ref 70–99)
Glucose-Capillary: 157 mg/dL — ABNORMAL HIGH (ref 70–99)

## 2021-06-10 LAB — CBC
HCT: 35.3 % — ABNORMAL LOW (ref 39.0–52.0)
Hemoglobin: 11.8 g/dL — ABNORMAL LOW (ref 13.0–17.0)
MCH: 29.6 pg (ref 26.0–34.0)
MCHC: 33.4 g/dL (ref 30.0–36.0)
MCV: 88.5 fL (ref 80.0–100.0)
Platelets: 109 10*3/uL — ABNORMAL LOW (ref 150–400)
RBC: 3.99 MIL/uL — ABNORMAL LOW (ref 4.22–5.81)
RDW: 14.6 % (ref 11.5–15.5)
WBC: 9.9 10*3/uL (ref 4.0–10.5)
nRBC: 0 % (ref 0.0–0.2)

## 2021-06-10 LAB — BASIC METABOLIC PANEL
Anion gap: 4 — ABNORMAL LOW (ref 5–15)
BUN: 11 mg/dL (ref 8–23)
CO2: 26 mmol/L (ref 22–32)
Calcium: 7.9 mg/dL — ABNORMAL LOW (ref 8.9–10.3)
Chloride: 107 mmol/L (ref 98–111)
Creatinine, Ser: 1.27 mg/dL — ABNORMAL HIGH (ref 0.61–1.24)
GFR, Estimated: 57 mL/min — ABNORMAL LOW (ref >60)
Glucose, Bld: 91 mg/dL (ref 70–99)
Potassium: 4 mmol/L (ref 3.5–5.1)
Sodium: 137 mmol/L (ref 135–145)

## 2021-06-10 MED ORDER — AMOXICILLIN-POT CLAVULANATE 875-125 MG PO TABS
1.0000 | ORAL_TABLET | Freq: Two times a day (BID) | ORAL | Status: DC
  Administered 2021-06-10 – 2021-06-11 (×3): 1 via ORAL
  Filled 2021-06-10 (×3): qty 1

## 2021-06-10 NOTE — Consult Note (Signed)
?Consultation Note ?Date: 06/10/2021  ? ?Patient Name: Donald Richardson  ?DOB: 08-14-1940  MRN: [id number]  Age / Sex: 81 y.o., male  ?PCP: Denita Lung, MD ?Referring Physician: Geradine Girt, DO ? ?Reason for Consultation: Establishing goals of care ? ?HPI/Patient Profile: 81 y.o. male  with past medical history of Parkinson's disease, cerebrel small vessel disease, CKD III, combined heart failure, HLD and DM 2 in remission presented to ED on 06/07/21 from [facility] with complaints of 2 episodes of rectal bleeding. He was on aspirin and Plavix. CT showed inflammatory colitis and incidental finding of infrarenal abdominal aneurysm and right-sided common iliac artery aneurysm.  Vascular surgery was consulted and will discuss elective repair in outpatient setting.  Patient was admitted 06/07/2021 with bright red blood per rectum, pulmonary infiltrate in right lung on chest x-ray (likely aspiration), infrarenal AAA without rupture.  ? ?Clinical Assessment and Goals of Care: ?I have reviewed medical records including EPIC notes, labs, and imaging. Received report from primary RN -no acute concerns.  RN reports patient is alert and oriented, sleepy throughout the day, eating and drinking 45 to 50% of meals. ? ?Went to visit patient at bedside -no family/visitors present. Patient was lying in bed awake, alert, oriented, and able to participate in conversation.  He is hard of hearing.  No signs or non-verbal gestures of pain or discomfort noted. No respiratory distress, increased work of breathing, or secretions noted.  Patient denies pain. ? ?Met with patient to discuss diagnosis, prognosis, GOC, EOL wishes, disposition, and options.  After I met with patient I called his brother/[name] -information below has been obtained and combined from the separate conversations with patient and [name].  Information below was discussed with patient and  [name].  Patient was agreeable for me to call [name] and speak with him. ? ?I introduced Palliative Medicine as specialized medical care for people living with serious illness. It focuses on providing relief from the symptoms and stress of a serious illness. The goal is to improve quality of life for both the patient and the family. ? ?We discussed a brief life review of the patient as well as functional and nutritional status.  He is not married and has no children; his closest relatives are his brothers.  Prior to hospitalization, patient was living at [facility] assisted living facility since July 2022.  Patient was able to ambulate with a walker; however, as of 2 months ago per [name] patient has been wheelchair-bound and increasingly weak.  [name] sees the patient every other week.  Patient is able to dress himself but does need assistance with bathing and meals.  Per patient his appetite has been good.  Albumin noted to be 3.9 on 04/30/2021.  Patient noticed a decline as of 3 days ago -he was able to transfer independently to his wheelchair at that time; however, he states now he has a hard time standing.  Per [name], patient was diagnosed with Parkinson's 4 years ago even though he was showing signs and symptoms for a long time previously to that diagnosis. ? ?I discussed with both patient and [name] patient's current illness and what it means in the larger context of patient's on-going co-morbidities.  Patient and [name] have a clear understanding of patient's acute medical situation.  Both understand that Parkinson's, COPD, and CKD are progressive, non-curable disease underlying the patient's current acute medical conditions. Natural disease trajectory for chronic illnesses and expectations at EOL were discussed. I attempted to elicit values  and goals of care important to the patient. The difference between aggressive medical intervention and comfort care was considered in light of the patient's goals of care.   Patient states his goal is to work with home health PT after discharge back to [facility].  Reviewed with patient and his brother that there will likely be a time in the future or patient will need transition from assisted living facility to long-term care -both expressed understanding. ? ?[name] describes patient's quality of life as "horrible."  He tells me that patient has not done anything to promote his health.  Patient has previously worked with rehab/physical therapy and [name] states that patient was not motivated to do exercises on his own outside schedule times therapist was working with him.  [name] expresses concern of not knowing how well patient will do this time with therapy.  [name] understands that patient will likely need transition to long-term care in the near future. ? ?I spoke with patient about his thoughts on AAA -he tells me "this is not new."  He is open to meeting with vascular surgery outpatient and would be open to surgery/invasive procedures if medically recommended.  Provided updates to [name] around patient's thoughts on this topic. ? ?Reviewed patient's risk for aspiration as well as results of MBS.  Patient would never want his life prolonged with PEG tube.  ? ?Palliative Care services outpatient were explained and offered -agreeable. ? ?Advance directives, concepts specific to code status, artificial feeding and hydration, and rehospitalization were considered and discussed.  Introduced, reviewed, completed MOST form as outlined under recommendation section below; however, patient request to sign and finalize tomorrow when his brother can be present.  Patient does have a healthcare power of attorney document -reviewed primary and secondary healthcare power of attorney and he does not wish to make changes. ? ?Patient confirms DNR/DNI status.  [name] was aware of patient's decision on this and is supportive. ? ?Reviewed patient's lab work with [name].  Education provided on indications  for blood transfusion and how patient has not yet met that criteria per his request.  Updated on patient's decisions on MOST form -he is supportive. ? ?Discussed with patient/family the importance of continued conversation with each other and the medical providers regarding overall plan of care and treatment options, ensuring decisions are within the context of the patient?s values and GOCs.   ? ?Questions and concerns were addressed. The patient/family was encouraged to call with questions and/or concerns. PMT card was provided. ? ? ?Primary Decision Maker: ?PATIENT ?  ? ?SUMMARY OF RECOMMENDATIONS   ?Continue gentle medical interventions ?Continue DNR/DNI - durable DNR form completed and placed in shadow chart. Copy was made and will be scanned into Vynca/ACP tab ?Patient open for discharge back to [facility] with [employer] and outpatient palliative care to follow ?Patient open to follow-up outpatient with vascular surgery about AAA -he would be open to invasive procedures if medically recommended ?MOST form completed but not finalized today as follows: DNR/DNI, limited additional interventions, determine use or limitation of antibiotics when infection occurs, no IV fluids, no feeding tube. Patient request for his brother to be present before signing.  In person meeting scheduled for tomorrow 4/11 at 12 PM with patient and his brother to finalize document ?TOC consulted for: Outpatient palliative care referral ?PMT will continue to follow and support holistically ? ? ?Code Status/Advance Care Planning: ?DNR ? ?Palliative Prophylaxis:  ?Aspiration, Bowel Regimen, Delirium Protocol, Frequent Pain Assessment, Oral Care, and Turn  Reposition ? ?Additional Recommendations (Limitations, Scope, Preferences): ?Full Scope Treatment ? ?Psycho-social/Spiritual:  ?Desire for further Chaplaincy support:no ?Created space and opportunity for patient and family to express thoughts and feelings regarding patient's current medical  situation.  ?Emotional support and therapeutic listening provided. ? ?Prognosis:  ?Long term poor in the setting of advanced age, Parkinson's disease, high risk for rehospitalization, and multiple comorbid

## 2021-06-10 NOTE — Plan of Care (Signed)

## 2021-06-10 NOTE — Progress Notes (Signed)
?PROGRESS NOTE ? ? ? ?SCHNEIDER WARCHOL  [name] DOB: 06/29/1940 DOA: 06/07/2021 ?PCP: Denita Lung, MD  ? ? ?Brief Narrative:  ?[name], a 81 y/o man with h/o Parkinson's disease, cerebrel small vessel disease, CKD III, combined heart failure, HLD and DM 2 in remission presents with a report of BRBPR. He has no prior h/o GI problems in Epic except colonoscopy in 2009 with diverticula and 1 sessile polyp. Hgb stable.  He had evidence of inflammatory collitis on CT A/P. INcidental finding of 5.9x5.4x7.4 infrarenal aortic aneurysm.  Also with aspiration pneumonia.  MBS w/o overt aspiration.  PT recommends H/H at ALF  ? ? ?Assessment and Plan: ?* BRBPR (bright red blood per rectum) ?Patient w/ h/o diverticula ?- Painless bleed. Suspect diverticular bleed. ?-appears resolved ? ?  ? ?Pulmonary infiltrate in left lung on CXR ?-appears to be aspiration ?- unasyn-- change to augmentin ?-MBS does not show overt aspiration ? ?Infrarenal abdominal aortic aneurysm (AAA) without rupture ([place]) ?Incidental finding on CT A/P - relatively large: 5.9x5.4x7.4. Seen by vascular surgery - Dr. Virl Cagey. For outpatient follow up to discuss intervention. ? ?Parkinson's disease ([place]) ?Follows with neurology. Has been stable. ?-Continue home regimen ? ? ?CKD (chronic kidney disease), stage III ([place]) ?-daily labs ?-appears to be at baseline ? ?Hypertension ?-Continue home meds ? ?Type 2 diabetes mellitus in remission ([place]) ?-SSI ? ?Hyperlipidemia ?-Continue home regimen ? ? ? ?Palliative care for [name] ? ?Back to [facility] in AM?? ? ? ? ? ? ?DVT prophylaxis: Place TED hose Start: 06/07/21 1848 ? ?  Code Status: DNR- ?Family Communication:  ? ?Disposition Plan:  ?Level of care: Med-Surg ?Status is: inpt ?  ? ?Consultants:  ?SLP ? ? ?Subjective: ?No complaints ? ?Objective: ?Vitals:  ? 06/09/21 2303 06/10/21 0332 06/10/21 0728 06/10/21 1106  ?BP: (!) 163/78 136/75 (!) 151/82 111/68  ?Pulse: (!) 47 (!) 52 (!) 55 (!) 58  ?Resp: '16 14 16 16  '$ ?Temp: 99.1  ?F (37.3 ?C) 98.9 ?F (37.2 ?C) 97.6 ?F (36.4 ?C) 98.2 ?F (36.8 ?C)  ?TempSrc: Oral Oral Oral Oral  ?SpO2: 96% 90% 92% 90%  ?Weight:      ?Height:      ? ? ?Intake/Output Summary (Last 24 hours) at 06/10/2021 1355 ?Last data filed at 06/10/2021 3300 ?Gross per 24 hour  ?Intake 486.71 ml  ?Output 1100 ml  ?Net -613.29 ml  ? ?Filed Weights  ? 06/07/21 1322 06/08/21 0320  ?Weight: 63.5 kg 62.8 kg  ? ? ?Examination: ? ? ? ?General: Appearance:    Elderly male in no acute distress  ?   ?Lungs:      respirations unlabored  ?Heart:    Bradycardic. Normal rhythm. No murmurs, rubs, or gallops.   ?MS:   All extremities are intact.  ?  ?Neurologic:   Awake, alert, hard of hearing  ?  ?  ? ? ? ?Data Reviewed: I have personally reviewed following labs and imaging studies ? ?CBC: ?Recent Labs  ?Lab 06/07/21 ?1333 06/07/21 ?2159 06/08/21 ?0327 06/08/21 ?1012 06/09/21 ?0147 06/10/21 ?0406  ?WBC 11.9*  --   --   --  11.3* 9.9  ?NEUTROABS 9.8*  --   --   --   --   --   ?HGB 15.1 13.3 12.9* 12.9* 11.6* 11.8*  ?HCT 47.0 42.2 39.5 39.5 35.1* 35.3*  ?MCV 90.9  --   --   --  89.5 88.5  ?PLT 144*  --   --   --  110* 109*  ? ?Basic Metabolic Panel: ?Recent Labs  ?Lab 06/07/21 ?1333 06/09/21 ?0147 06/10/21 ?0406  ?NA 139 136 137  ?K 4.0 3.9 4.0  ?CL 107 106 107  ?CO2 '24 24 26  '$ ?GLUCOSE 126* 104* 91  ?BUN '16 11 11  '$ ?CREATININE 1.45* 1.27* 1.27*  ?CALCIUM 8.8* 7.8* 7.9*  ? ?GFR: ?Estimated Creatinine Clearance: 41.9 mL/min (A) (by C-G formula based on SCr of 1.27 mg/dL (H)). ?Liver Function Tests: ?No results for input(s): AST, ALT, ALKPHOS, BILITOT, PROT, ALBUMIN in the last 168 hours. ?No results for input(s): LIPASE, AMYLASE in the last 168 hours. ?No results for input(s): AMMONIA in the last 168 hours. ?Coagulation Profile: ?No results for input(s): INR, PROTIME in the last 168 hours. ?Cardiac Enzymes: ?No results for input(s): CKTOTAL, CKMB, CKMBINDEX, TROPONINI in the last 168 hours. ?BNP (last 3 results) ?No results for input(s): PROBNP in  the last 8760 hours. ?HbA1C: ?Recent Labs  ?  06/07/21 ?2159  ?HGBA1C 6.3*  ? ?CBG: ?Recent Labs  ?Lab 06/09/21 ?1217 06/09/21 ?1845 06/09/21 ?2118 06/10/21 ?5916 06/10/21 ?1108  ?GLUCAP 130* 194* 104* 87 143*  ? ?Lipid Profile: ?Recent Labs  ?  06/08/21 ?0327  ?CHOL 86  ?HDL 32*  ?Norris 35  ?TRIG 96  ?CHOLHDL 2.7  ? ?Thyroid Function Tests: ?No results for input(s): TSH, T4TOTAL, FREET4, T3FREE, THYROIDAB in the last 72 hours. ?Anemia Panel: ?No results for input(s): VITAMINB12, FOLATE, FERRITIN, TIBC, IRON, RETICCTPCT in the last 72 hours. ?Sepsis Labs: ?Recent Labs  ?Lab 06/07/21 ?1640 06/07/21 ?2159  ?PROCALCITON  --  <0.10  ?LATICACIDVEN 0.8  --   ? ? ?No results found for this or any previous visit (from the past 240 hour(s)).  ? ? ? ? ? ?Radiology Studies: ?DG Swallowing Func-Speech Pathology ? ?Result Date: 06/10/2021 ?Table formatting from the original result was not included. Images from the original result were not included. Objective Swallowing Evaluation: Type of Study: MBS-Modified Barium Swallow Study  Patient Details Name: Donald Richardson MRN: [id number] Date of Birth: [birth year] Today's Date: 06/10/2021 Time: SLP Start Time (ACUTE ONLY): 1243 -SLP Stop Time (ACUTE ONLY): 1310 SLP Time Calculation (min) (ACUTE ONLY): 27 min Past Medical History: Past Medical History: Diagnosis Date  ASHD (arteriosclerotic heart disease)   STENT  COPD (chronic obstructive pulmonary disease) ([place])   Diabetes mellitus   Hgb A1C- 5.8 in May 2017  Hemorrhoids   Hyperlipidemia   Hypertension   Parkinson disease ([place])   Smoker  Past Surgical History: Past Surgical History: Procedure Laterality Date  Coronary artery stenting    INGUINAL HERNIA REPAIR Left 10/26/2015  Procedure: LEFT INGUINAL HERNIA REPAIR WITH MESH;  Surgeon: Jackolyn Confer, MD;  Location: [facility] ORS;  Service: General;  Laterality: Left;  INSERTION OF MESH Left 10/26/2015  Procedure: INSERTION OF MESH;  Surgeon: Jackolyn Confer, MD;  Location: [facility] ORS;  Service:  General;  Laterality: Left; HPI: 81 y/o man presents with a report of BRBPR, pulmonary infiltrate in left lung that imaging reports as appearing to be related to chronic aspiration. Hx of Parkinson's disease, cerebrel small vessel disease, CKD III, combined heart failure, HLD and DM 2 in remission. He has no prior h/o GI problems in Epic except colonoscopy in 2009 with diverticula and 1 sessile polyp. No hx of swallowing assessments in [facility] records.  Subjective: alert, very HOH  Recommendations for follow up therapy are one component of a multi-disciplinary discharge planning process, led by the attending physician.  Recommendations may be updated based  on patient status, additional functional criteria and insurance authorization. Assessment / Plan / Recommendation   06/10/2021   1:00 PM Clinical Impressions Clinical Impression Pt presents with a mild oral dysphagia marked by lingual pumping and prolonged time to initiate transfer of bolus into pharynx; however, pharyngeal swallow is strong and effective. There was reliable laryngeal vestibule closure with no penetration nor aspiration. Pharyngeal stripping was strong with no residue post-swallow.  No obvious predisposing conditions for aspiration noted on today's study. Recommend continuing current diet of regular solids thin liquids. Pills whole with water. No SLP f/u needed. Our service will sign off. D/W pt and his RN. SLP Visit Diagnosis Dysphagia, oral phase (R13.11)     06/10/2021   1:00 PM Treatment Recommendations Treatment Recommendations No treatment recommended at this time      View : No data to display.      06/10/2021   1:00 PM Diet Recommendations SLP Diet Recommendations Regular solids;Thin liquid Liquid Administration via Cup;Straw Medication Administration Whole meds with liquid Compensations Minimize environmental distractions     06/10/2021   1:00 PM Other Recommendations Oral Care Recommendations Oral care BID Follow Up Recommendations No SLP  follow up Assistance recommended at discharge Frequent or constant Supervision/Assistance    View : No data to display.        06/10/2021   1:00 PM Oral Phase Oral Phase Impaired Oral - Thin Cup Weak lingual ma

## 2021-06-10 NOTE — Plan of Care (Signed)
?  Problem: Education: Goal: Knowledge of General Education information will improve Description: Including pain rating scale, medication(s)/side effects and non-pharmacologic comfort measures Outcome: Progressing   Problem: Health Behavior/Discharge Planning: Goal: Ability to manage health-related needs will improve Outcome: Progressing   Problem: Clinical Measurements: Goal: Ability to maintain clinical measurements within normal limits will improve Outcome: Progressing Goal: Will remain free from infection Outcome: Progressing Goal: Diagnostic test results will improve Outcome: Progressing Goal: Respiratory complications will improve Outcome: Progressing Goal: Cardiovascular complication will be avoided Outcome: Progressing   Problem: Nutrition: Goal: Adequate nutrition will be maintained Outcome: Progressing   Problem: Coping: Goal: Level of anxiety will decrease Outcome: Progressing   Problem: Elimination: Goal: Will not experience complications related to bowel motility Outcome: Progressing Goal: Will not experience complications related to urinary retention Outcome: Progressing   Problem: Skin Integrity: Goal: Risk for impaired skin integrity will decrease Outcome: Progressing   Problem: Safety: Goal: Ability to remain free from injury will improve Outcome: Progressing   

## 2021-06-10 NOTE — Progress Notes (Signed)
Modified Barium Swallow Progress Note ? ?Patient Details  ?Name: Donald Richardson ?MRN: [id number] ?Date of Birth: [birth year] ? ?Today's Date: 06/10/2021 ? ?Modified Barium Swallow completed.  Full report located under Chart Review in the Imaging Section. ? ?Brief recommendations include the following: ? ?Clinical Impression ? Pt presents with a mild oral dysphagia marked by lingual pumping and prolonged time to initiate transfer of bolus into pharynx; however, pharyngeal swallow is strong and effective. There was reliable laryngeal vestibule closure with no penetration nor aspiration. Pharyngeal stripping was strong with no residue post-swallow.  No obvious predisposing conditions for aspiration noted on today's study. Recommend continuing current diet of regular solids thin liquids. Pills whole with water. No SLP f/u needed. Our service will sign off. D/W pt and his RN. ?  ?Swallow Evaluation Recommendations ? ?   ? ? SLP Diet Recommendations: Regular solids;Thin liquid ? ? Liquid Administration via: Cup;Straw ? ? Medication Administration: Whole meds with liquid ? ? Supervision: Patient able to self feed ? ? Compensations: Minimize environmental distractions ? ?   ? ? Oral Care Recommendations: Oral care BID ? ?   ? ?Lynne Righi L. Gradyn Shein, MA CCC/SLP ?Acute Rehabilitation Services ?Office number [phone] ?Pager [phone] ? ? ?[name] ?06/10/2021,1:22 PM ?

## 2021-06-11 DIAGNOSIS — K625 Hemorrhage of anus and rectum: Secondary | ICD-10-CM | POA: Diagnosis not present

## 2021-06-11 DIAGNOSIS — G20 Parkinson's disease: Secondary | ICD-10-CM | POA: Diagnosis not present

## 2021-06-11 DIAGNOSIS — K922 Gastrointestinal hemorrhage, unspecified: Secondary | ICD-10-CM

## 2021-06-11 DIAGNOSIS — N1831 Chronic kidney disease, stage 3a: Secondary | ICD-10-CM | POA: Diagnosis not present

## 2021-06-11 LAB — GLUCOSE, CAPILLARY
Glucose-Capillary: 97 mg/dL (ref 70–99)
Glucose-Capillary: 121 mg/dL — ABNORMAL HIGH (ref 70–99)

## 2021-06-11 MED ORDER — AMOXICILLIN-POT CLAVULANATE 875-125 MG PO TABS: 1.0000 | ORAL_TABLET | Freq: Two times a day (BID) | ORAL | 0 refills | Status: AC

## 2021-06-11 NOTE — TOC Progression Note (Signed)
Transition of Care (TOC) - Progression Note  ? ? ?Patient Details  ?Name: Donald Richardson ?MRN: [id number] ?Date of Birth: 1940/08/23 ? ?Transition of Care (TOC) CM/SW Contact  ?Vinie Sill, LCSW ?Phone Number: ?06/11/2021, 4:46 PM ? ?Clinical Narrative:    ? ?Sent referral to [facility] for outpatient palliative care at [place]. ? ?Thurmond Butts, MSW, LCSW ?Clinical Social Worker ? ? ? ?Expected Discharge Plan: Assisted Living ?Barriers to Discharge: Barriers Resolved ? ?Expected Discharge Plan and Services ?Expected Discharge Plan: Assisted Living ?  ?  ?  ?  ?Expected Discharge Date: 06/11/21               ?  ?  ?  ?  ?  ?  ?  ?  ?  ?  ? ? ?Social Determinants of Health (SDOH) Interventions ?  ? ?Readmission Risk Interventions ?   ? View : No data to display.  ?  ?  ?  ? ? ?

## 2021-06-11 NOTE — TOC Transition Note (Signed)
Transition of Care (TOC) - CM/SW Discharge Note ? ? ?Patient Details  ?Name: LINDWOOD MOGEL ?MRN: [id number] ?Date of Birth: 09/07/1940 ? ?Transition of Care (TOC) CM/SW Contact:  ?Vinie Sill, LCSW ?Phone Number: ?06/11/2021, 4:02 PM ? ? ?Clinical Narrative:    ? ?Patient will Discharge to: [facility] ?Discharge Date: 06/11/2021 ?Family Notified: brother  ?Transport SW:NIO[facility]O transported ?CSW informed ALF/[facility]-  patient will need outpatient Palliative to follow.  ? ?Per MD patient is ready for discharge. RN, patient, and facility notified of discharge. Discharge Summary sent to facility. RN given number for [phone]. Ambulance transport requested for patient.  ? ?Clinical Social Worker signing off. ? ?Thurmond Butts, MSW, LCSW ?Clinical Social Worker ? ? ? ? ?Final next level of care: Assisted Living ?  ? ? ?Patient Goals and CMS Choice ?  ?  ?  ? ?Discharge Placement ?  ?           ?  ?  ?  ?  ? ?Discharge Plan and Services ?  ?  ?           ?  ?  ?  ?  ?  ?  ?  ?  ?  ?  ? ?Social Determinants of Health (SDOH) Interventions ?  ? ? ?Readmission Risk Interventions ?   ? View : No data to display.  ?  ?  ?  ? ? ? ? ? ?

## 2021-06-11 NOTE — Progress Notes (Signed)
Pt/family given discharge instructions, medication lists, follow up appointments, and when to call the doctor.  Pt/family verbalizes understanding. Patient dressed and transported to main entrance via wheelchair. Payton Emerald, RN ? ? ?

## 2021-06-11 NOTE — Progress Notes (Signed)
Physical Therapy Treatment ?Patient Details ?Name: Donald Richardson ?MRN: [id number] ?DOB: [birth year] ?Today's Date: 06/11/2021 ? ? ?History of Present Illness Pt is a 81 y.o. M who presents with report of BRBPR. CT A/P incidental finding of 5.9x5.4x7.4 infrarenal aortic aneurysm. Also with aspiration PNA. MBS 4/10 SLP recommend regular diet/thin liquids. Significant PMH: Parkinson disease, cerebral small vessel disease, CKD III, combined heart failure, HLD, DM2. ? ?  ?PT Comments  ? ? Pt received in supine, agreeable to therapy session with encouragement, pt brother initially present but leaving room prior to pt getting OOB. Pt able to perform bed mobility, transfers and gait training with RW with up to minA and +2 for safety due to need for wheelchair follow. Pt performed short household distance gait trial with single rest break x2 trials. Pt reports moderate fatigue (4/10 modified RPE) at end of session and agreeable to sit up in chair to eat lunch, chair alarm on for safety. Supervising PT notified pt progressing well with ambulation may need ambulatory goal added next session. Pt continues to benefit from PT services to progress toward functional mobility goals.   ?Recommendations for follow up therapy are one component of a multi-disciplinary discharge planning process, led by the attending physician.  Recommendations may be updated based on patient status, additional functional criteria and insurance authorization. ? ?Follow Up Recommendations ? Home health PT (at [facility]) ?  ?  ?Assistance Recommended at Discharge Frequent or constant Supervision/Assistance  ?Patient can return home with the following A little help with walking and/or transfers;A little help with bathing/dressing/bathroom ?  ?Equipment Recommendations ? None recommended by PT  ?  ?Recommendations for Other Services   ? ? ?  ?Precautions / Restrictions Precautions ?Precautions: Fall ?Restrictions ?Weight Bearing Restrictions: No  ?   ? ?Mobility ? Bed Mobility ?Overal bed mobility: Needs Assistance ?Bed Mobility: Supine to Sit ?  ?  ?Supine to sit: Min assist, +2 for safety/equipment ?  ?  ?General bed mobility comments: Cues for initiation, assist for truncal elevation and use of bed pad to scoot hips anteriorly. Pt needing increased time/cues to perform. ?  ? ?Transfers ?Overall transfer level: Needs assistance ?Equipment used: Rolling walker (2 wheels) ?Transfers: Sit to/from Stand, Bed to chair/wheelchair/BSC ?Sit to Stand: Min assist ?  ?Step pivot transfers: Min assist ?  ?  ?  ?General transfer comment: Light minA to rise and pivot towards left to wheelchair/from WC>recliner, cues for hand placement and sequencing. Increased time to perform, pt with fair fall risk prevention awareness asking if brakes are locked prior to transfer. ?  ? ?Ambulation/Gait ?Ambulation/Gait assistance: Min assist, +2 safety/equipment ?Gait Distance (Feet): 70 Feet ?Assistive device: Rolling walker (2 wheels) ?Gait Pattern/deviations: Step-to pattern, Step-through pattern, Narrow base of support, Trunk flexed, Festinating, Drifts right/left, Decreased stride length, Knee flexed in stance - right, Knee flexed in stance - left ?  ?  ?  ?General Gait Details: poor RW management, pt tending to maintain forward head/rounded shoulders posture and small steps/festinating gait pattern, mod cues for upright posture and forward gaze, pt with posterior bias when looking downward; x1 seated rest break ? ? ?  ?Balance Overall balance assessment: Needs assistance ?Sitting-balance support: Feet supported, Single extremity supported ?Sitting balance-Leahy Scale: Poor ?Sitting balance - Comments: reliant on 1-2 UE support ?  ?Standing balance support: Bilateral upper extremity supported, During functional activity ?Standing balance-Leahy Scale: Poor ?Standing balance comment: reliant on BUE support and at least minA for safety with dynamic  tasks ?  ?  ?  ?  ?  ?  ?  ?  ?  ?  ?   ?  ? ?  ?Cognition Arousal/Alertness: Awake/alert ?Behavior During Therapy: [facility] for tasks assessed/performed ?Overall Cognitive Status: No family/caregiver present to determine baseline cognitive functioning ?  ?  ?  ?  ?  ?  ?  ?  ?  ?  ?  ?  ?  ?  ?  ?  ?General Comments: Able to follow basic commands, pt mildly irritated by repetitive cues although frequently unsafe with UE placement/RW proximity so needing mod cues for safety. Pt self-directed at times. ?  ?  ? ?  ?Exercises   ? ?  ?General Comments General comments (skin integrity, edema, etc.): SpO2 WFL on RA during ambulation, RN notified. HR variable 59-72 bpm with exertion. Pt reports 4/10 modified RPE (fatigue) at end of session. ?  ?  ? ?Pertinent Vitals/Pain Pain Assessment ?Pain Assessment: No/denies pain  ? ? ? ?PT Goals (current goals can now be found in the care plan section) Acute Rehab PT Goals ?Patient Stated Goal: get stronger, go home ?PT Goal Formulation: With patient ?Time For Goal Achievement: 06/23/21 ?Progress towards PT goals: Progressing toward goals ? ?  ?Frequency ? ? ? Min 3X/week ? ? ? ?  ?PT Plan Current plan remains appropriate  ? ? ?   ?AM-PAC PT "6 Clicks" Mobility   ?Outcome Measure ? Help needed turning from your back to your side while in a flat bed without using bedrails?: A Little ?Help needed moving from lying on your back to sitting on the side of a flat bed without using bedrails?: A Little ?Help needed moving to and from a bed to a chair (including a wheelchair)?: A Little ?Help needed standing up from a chair using your arms (e.g., wheelchair or bedside chair)?: A Little ?Help needed to walk in hospital room?: A Lot (mod cues) ?Help needed climbing 3-5 steps with a railing? : Total ?6 Click Score: 15 ? ?  ?End of Session Equipment Utilized During Treatment: Gait belt ?Activity Tolerance: Patient tolerated treatment well ?Patient left: in chair;with call bell/phone within reach ?Nurse Communication: Mobility status ?PT  Visit Diagnosis: Muscle weakness (generalized) (M62.81);Unsteadiness on feet (R26.81) ?  ? ? ?Time: 2122-4825 ?PT Time Calculation (min) (ACUTE ONLY): 38 min ? ?Charges:  $Gait Training: 23-37 mins ?$Therapeutic Activity: 8-22 mins          ?          ? ?Sabina Beavers P., PTA ?Acute Rehabilitation Services ?Secure Chat Preferred 9a-5:30pm ?Office: [phone]  ? ? ?Wing Gfeller M Marcelle Bebout ?06/11/2021, 2:29 PM ? ?

## 2021-06-11 NOTE — Progress Notes (Signed)
SATURATION QUALIFICATIONS: (This note is used to comply with regulatory documentation for home oxygen) ? ?Patient Saturations on Room Air at Rest = 96% ? ?Patient Saturations on Room Air while Ambulating = 95% ? ? ?Please briefly explain why patient needs home oxygen: N/A; pt SpO2 WFL on RA, maintains >92% throughout ambulation on RA. ?

## 2021-06-11 NOTE — Discharge Summary (Signed)
?Physician Discharge Summary ?  ?Patient: Donald Richardson MRN: [id number] DOB: 11/14/1940  ?Admit date:     06/07/2021  ?Discharge date: 06/11/21  ?Discharge Physician: Geradine Girt  ? ?PCP: Denita Lung, MD  ? ?Recommendations at discharge:  ? ? Home health ?MOST form completed ?Aspiration precautions  ? ?Discharge Diagnoses: ?Principal Problem: ?  BRBPR (bright red blood per rectum) ?Active Problems: ?  Hypertension ?  CKD (chronic kidney disease), stage III ([place]) ?  Parkinson's disease ([place]) ?  Infrarenal abdominal aortic aneurysm (AAA) without rupture ([place]) ?  Pulmonary infiltrate in left lung on CXR ?  Hyperlipidemia ?  Type 2 diabetes mellitus in remission ([place]) ? ? ? ?Hospital Course: ?Mr. Whorley, a 81 y/o man with h/o Parkinson's disease, cerebrel small vessel disease, CKD III, combined heart failure, HLD and DM 2 in remission presents with a report of BRBPR. He has no prior h/o GI problems in Epic except colonoscopy in 2009 with diverticula and 1 sessile polyp. Hgb stable.  He had evidence of inflammatory collitis on CT A/P. INcidental finding of 5.9x5.4x7.4 infrarenal aortic aneurysm.  Also with aspiration pneumonia.  MBS w/o overt aspiration.  PT recommends H/H at ALF ? ?Assessment and Plan: ?* BRBPR (bright red blood per rectum) ?Patient w/ h/o diverticula ?- Painless bleed. Suspect diverticular bleed. ?-appears resolved ? ?  ? ?Pulmonary infiltrate in left lung on CXR ?-appears to be aspiration ?- unasyn-- change to augmentin ?-MBS does not show overt aspiration ? ?Infrarenal abdominal aortic aneurysm (AAA) without rupture ([place]) ?Incidental finding on CT A/P - relatively large: 5.9x5.4x7.4. Seen by vascular surgery - Dr. Virl Cagey. For outpatient follow up to discuss intervention. ? ?Parkinson's disease ([place]) ?Follows with neurology. Has been stable. ?-Continue home regimen ? ? ?CKD (chronic kidney disease), stage III ([place]) ?-daily labs ?-appears to be at baseline ? ?Hypertension ?-Continue home meds ? ?Type  2 diabetes mellitus in remission ([place]) ?Diet controlled ? ?Hyperlipidemia ?-Continue home regimen ? ? ? ?Consultants: vascular ?Palliative care for MOST for ? ?Disposition: Assisted living ?Diet recommendation:  ?Discharge Diet Orders (From admission, onward)  ? ?  Start     Ordered  ? 06/11/21 0000  Diet general       ? 06/11/21 1241  ? ?  ?  ? ?  ? ?Carb modified diet ?DISCHARGE MEDICATION: ?Allergies as of 06/11/2021   ?No Known Allergies ?  ? ?  ?Medication List  ?  ? ?STOP taking these medications   ? ?clopidogrel 75 MG tablet ?Commonly known as: PLAVIX ?  ?Glucerna Liqd ?  ? ?  ? ?TAKE these medications   ? ?amoxicillin-clavulanate 875-125 MG tablet ?Commonly known as: AUGMENTIN ?Take 1 tablet by mouth every 12 (twelve) hours. ?  ?Aspirin Low Dose 81 MG EC tablet ?Generic drug: aspirin ?TAKE 1 TABLET BY MOUTH EVERY DAY ?What changed: how much to take ?  ?carbidopa-levodopa 25-100 MG tablet ?Commonly known as: SINEMET IR ?Take 1 tablet by mouth 4 (four) times daily. ?  ?cetaphil cream ?Apply 1 application topically 2 (two) times daily. Apply to face and extremities ?What changed: additional instructions ?  ?lisinopril 10 MG tablet ?Commonly known as: ZESTRIL ?TAKE 1 TABLET(10 MG) BY MOUTH DAILY ?What changed:  ?how much to take ?how to take this ?when to take this ?additional instructions ?  ?loperamide 2 MG capsule ?Commonly known as: IMODIUM ?Take 2 mg by mouth as needed for diarrhea or loose stools. Not to exceed 8 doses in 24 hours ?  ?  rosuvastatin 20 MG tablet ?Commonly known as: CRESTOR ?TAKE 1 TABLET BY MOUTH NIGHTLY AT BEDTIME ?What changed:  ?how much to take ?how to take this ?when to take this ?additional instructions ?  ? ?  ? ? Follow-up Information   ? ? [facility] Follow up in 1 month(s).   ?Why: The office will call the patient with an appointment ?Contact information: ?[address] ?[place] ?[phone] ? ?  ?  ? ? Denita Lung, MD Follow up in  1 week(s).   ?Specialty: Family Medicine ?Contact information: ?[place] ?[place] [phone] ?[phone] ? ? ?  ?  ? ?  ?  ? ?  ? ?Discharge Exam: ?Filed Weights  ? 06/07/21 1322 06/08/21 0320  ?Weight: 63.5 kg 62.8 kg  ? ?Hard of hearing ? ?Condition at discharge: good ? ?The results of significant diagnostics from this hospitalization (including imaging, microbiology, ancillary and laboratory) are listed below for reference.  ? ?Imaging Studies: ?DG Chest 2 View ? ?Result Date: 06/08/2021 ?CLINICAL DATA:  Infiltrate on previous study. History of COPD, diabetes, hypertension. Former smoker. EXAM: CHEST - 2 VIEW COMPARISON:  Chest x-rays dated 03/03/2020 and 08/20/2016. Chest CT dated 09/02/2012. FINDINGS: Heart size is upper normal, stable. Confluent opacity is seen within the retrocardiac LEFT lower lobe, likely increased compared to previous exams, possibly related to the asymmetrically prominent bronchiectasis demonstrated in the LEFT lower lobe on chest CT of 09/02/2012. No new lung abnormality. Emphysematous changes noted within the upper lobes. No pleural effusion or pneumothorax is seen. No acute-appearing osseous abnormality. Mild degenerative spondylosis of the kyphotic thoracic spine. IMPRESSION: 1. 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 Superimposed pneumonia not excluded. Consider chest CT for more definitive characterization. 2. Emphysema. Electronically Signed   By: Franki Cabot M.D.   On: 06/08/2021 08:25  ? ?CT CHEST WO CONTRAST ? ?Result Date: 06/08/2021 ?CLINICAL DATA:  Abnormal chest radiograph EXAM: CT CHEST WITHOUT CONTRAST TECHNIQUE: Multidetector CT imaging of the chest was performed following the standard protocol without IV contrast. RADIATION DOSE REDUCTION: This exam was  performed according to the departmental dose-optimization program which includes automated exposure control, adjustment of the mA and/or kV according to patient size and/or use of iterative reconstruction technique. COMPARISON:  Same day chest radiograph, CT abdomen pelvis, 06/07/2021, CT chest, 09/02/2012 FINDINGS: Cardiovascular: Aortic atherosclerosis. Cardiomegaly. Three-vessel coronary artery calcifications and stents. Enlargement of the main pulmonary artery measuring up to 3.9 cm in caliber. No pericardial effusion. Mediastinum/Nodes: No enlarged mediastinal, hilar, or axillary lymph nodes. Thyroid gland, trachea, and esophagus demonstrate no significant findings. Lungs/Pleura: Severe centrilobular emphysema. Diffuse bilateral bronchial wall thickening. Heterogeneous airspace opacity and bronchiectasis the dependent left lower lobe, increased compared to remote prior CT dated 09/02/2012. No pleural effusion or pneumothorax. Upper Abdomen: No acute abnormality. Musculoskeletal: No chest wall abnormality. No suspicious osseous lesions identified. IMPRESSION: 1. Heterogeneous airspace opacity and bronchiectasis the dependent left lower lobe, increased compared to remote prior CT dated 09/02/2012. Findings are consistent with aspiration, likely ongoing. 2. Severe emphysema and diffuse bilateral bronchial wall thickening. 3. Cardiomegaly and coronary artery disease. 4. Enlargement of the main pulmonary artery, as can be seen in pulmonary hypertension. Aortic Atherosclerosis (ICD10-I70.0) and Emphysema (ICD10-J43.9). Electronically Signed   By: Delanna Ahmadi M.D.   On: 06/08/2021 12:11  ? ?  CT ABDOMEN PELVIS W CONTRAST ? ?Result Date: 06/07/2021 ?CLINICAL DATA:  Abdominal pain, rectal bleeding EXAM: CT ABDOMEN AND PELVIS WITH CONTRAST TECHNIQUE: Multidetector CT imaging of the abdomen and pelvis was performed using the standard protocol following bolus administration of intravenous contrast. RADIATION DOSE REDUCTION:  This exam was performed according to the departmental dose-optimization program which includes automated exposure control, adjustment of the mA and/or kV according to patient size and/or use of itera

## 2021-06-11 NOTE — [place] FL2 (Addendum)
?[place] MEDICAID FL2 LEVEL OF CARE SCREENING TOOL  ?  ? ?IDENTIFICATION  ?Patient Name: ?Donald Richardson Birthdate: Apr 08, 1940 Sex: male Admission Date (Current Location): ?06/07/2021  ?[place] and [place] Number: ? [place] ?  Facility and Address:  ?[facility] [place], [place] [address], [place],  [address] ?     Provider Number: ?   ?Attending Physician Name and Address:  ?Geradine Girt, DO ? Relative Name and Phone Number:  ?  ?   ?Current Level of Care: ?Hospital Recommended Level of Care: ?Assisted Living Facility Prior Approval Number: ?  ? ?Date Approved/Denied: ?  PASRR Number: ?  ? ?Discharge Plan: ?Other (Comment) ([facility]) ?  ? ?Current Diagnoses: ?Patient Active Problem List  ? Diagnosis Date Noted  ? BRBPR (bright red blood per rectum) 06/07/2021  ? Infrarenal abdominal aortic aneurysm (AAA) without rupture ([place]) 06/07/2021  ? Pulmonary infiltrate in left lung on CXR 06/07/2021  ? History of COVID-19 03/03/2020  ? Onychomycosis 03/16/2019  ? Presbycusis of both ears 03/16/2019  ? Parkinson's disease ([place]) 01/09/2017  ? Gait disturbance, post-stroke 07/18/2016  ? Coronary artery disease involving native coronary artery of native heart without angina pectoris   ? 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 CVA (cerebrovascular accident due to intracerebral hemorrhage) ([place])   ? Thrombocytopenia ([place])   ? RBBB 06/21/2016  ? CKD (chronic kidney disease), stage III ([place]) 06/21/2016  ? H/O cataract extraction 06/26/2014  ? COLD (chronic obstructive lung disease) ([place]) 02/27/2014  ? Hypertension 06/16/2012  ? Type 2 diabetes mellitus in remission ([place]) 10/17/2010  ? Hyperlipidemia 08/14/2008  ? CAD, NATIVE VESSEL 08/14/2008  ? Bradycardia 08/14/2008  ? ? ?Orientation RESPIRATION BLADDER Height & Weight   ?  ?Self, Time, Situation, Place ? Normal Continent Weight: 138 lb 7.2 oz (62.8 kg) ?Height:  '5\' 8"'$  (172.7 cm)  ?BEHAVIORAL SYMPTOMS/MOOD NEUROLOGICAL BOWEL  NUTRITION STATUS  ?    Continent Diet (regular diet)  ?AMBULATORY STATUS COMMUNICATION OF NEEDS Skin   ?  Verbally Normal ?  ?  ?  ?    ?     ?     ? ? ?Personal Care Assistance Level of Assistance  ?Bathing, Feeding, Dressing Bathing Assistance: Limited assistance ?Feeding assistance: Independent ?Dressing Assistance: Limited assistance ?   ? ?Functional Limitations Info  ?Sight, Hearing, Speech   ?Hearing Info: Impaired (Hard of Hearing) ?Speech Info: Adequate  ? ? ?SPECIAL CARE FACTORS FREQUENCY  ?PT (By licensed PT), OT (By licensed OT)   ?  ?PT Frequency: 5x per week ?OT Frequency: 5x per week ?  ?  ?  ?   ? ? ?Contractures Contractures Info: Not present  ? ? ?Additional Factors Info  ?Code Status, Allergies Code Status Info: DNR ?Allergies Info: NKA ?  ?  ?  ?   ? ?Current Medications (06/11/2021):  This is the current hospital active medication list ?Current Facility-Administered Medications  ?Medication Dose Route Frequency Provider Last Rate Last Admin  ? 0.9 %  sodium chloride infusion   Intravenous Continuous Norins, Heinz Knuckles, MD 10 mL/hr at 06/08/21 1420 1 mL at 06/08/21 1420  ? acetaminophen (TYLENOL) tablet 650 mg  650 mg Oral Q6H PRN Norins, Heinz Knuckles, MD      ? Or  ? acetaminophen (TYLENOL) suppository 650 mg  650 mg Rectal Q6H PRN Norins, Heinz Knuckles, MD      ? amoxicillin-clavulanate (AUGMENTIN) 875-125 MG per tablet 1 tablet  1 tablet  Oral Q12H Geradine Girt, DO   1 tablet at 06/11/21 0093  ? aspirin EC tablet 81 mg  81 mg Oral Daily Norins, Heinz Knuckles, MD   81 mg at 06/11/21 8182  ? carbidopa-levodopa (SINEMET IR) 25-100 MG per tablet immediate release 1 tablet  1 tablet Oral QID Neena Rhymes, MD   1 tablet at 06/11/21 9937  ? cetaphil lotion   Topical BID Long, Wonda Olds, MD   Given at 06/11/21 [phone]  ? insulin aspart (novoLOG) injection 0-9 Units  0-9 Units Subcutaneous TID WC Norins, Heinz Knuckles, MD   1 Units at 06/10/21 1720  ? lisinopril (ZESTRIL) tablet 10 mg  10 mg Oral Daily Norins,  Heinz Knuckles, MD   10 mg at 06/11/21 7893  ? loperamide (IMODIUM) capsule 2 mg  2 mg Oral PRN Norins, Heinz Knuckles, MD      ? rosuvastatin (CRESTOR) tablet 20 mg  20 mg Oral QHS Norins, Heinz Knuckles, MD   20 mg at 06/10/21 2221  ? ? ? ?Discharge Medications: ? ?amoxicillin-clavulanate 875-125 MG tablet ?Commonly known as: AUGMENTIN ?Take 1 tablet by mouth every 12 (twelve) hours. ?   ?Aspirin Low Dose 81 MG EC tablet ?Generic drug: aspirin ?TAKE 1 TABLET BY MOUTH EVERY DAY ?What changed: how much to take ?   ?carbidopa-levodopa 25-100 MG tablet ?Commonly known as: SINEMET IR ?Take 1 tablet by mouth 4 (four) times daily. ?   ?cetaphil cream ?Apply 1 application topically 2 (two) times daily. Apply to face and extremities ?What changed: additional instructions ?   ?lisinopril 10 MG tablet ?Commonly known as: ZESTRIL ?TAKE 1 TABLET(10 MG) BY MOUTH DAILY ?What changed:  ?how much to take ?how to take this ?when to take this ?additional instructions ?   ?loperamide 2 MG capsule ?Commonly known as: IMODIUM ?Take 2 mg by mouth as needed for diarrhea or loose stools. Not to exceed 8 doses in 24 hours ?   ?rosuvastatin 20 MG tablet ?Commonly known as: CRESTOR ?TAKE 1 TABLET BY MOUTH NIGHTLY AT BEDTIME ?What changed:  ?how much to take ?how to take this ?when to take this ?additional instructions ?   ? ?Relevant Imaging Results: ? ?Relevant Lab Results: ? ? ?Additional Information ?SSN # [id number] ? ?Vinie Sill, LCSW ? ? ? ? ?

## 2021-06-11 NOTE — Progress Notes (Signed)
?                                                                                                                                                     ?                                                   ?Daily Progress Note  ? ?Patient Name: Donald Richardson       Date: 06/11/2021 ?DOB: June 23, 1940  Age: 81 y.o. MRN#: [id number] ?Attending Physician: Geradine Girt, DO ?Primary Care Physician: Denita Lung, MD ?Admit Date: 06/07/2021 ? ?Reason for Consultation/Follow-up: Establishing goals of care ? ?Subjective: ?Chart review performed. Received report from primary RN - no acute concerns.  ? ?12:00 PM ?Went to visit patient at bedside - brother/[name] and SIL/Nikki present. Reviewed interval history since admission and information as discussed yesterday. Patient confirms goal is for return to [facility] with [place] with outpatient Palliative Care. Education provided on AAA - patient does wish to follow up outpatient for further monitoring/recommendations.  ? ?Introduced, reviewed, and completed MOST form as outlined under Recommendation section below while patient's brother was present.  ? ?All questions and concerns addressed. Encouraged to call with questions and/or concerns. PMT card provided. ? ?2:30 PM ?Returned to patient's room per family requested - answered additional questions about CIR and PT. They are ready for patient discharge today.  ? ?Length of Stay: 3 ? ?Current Medications: ?Scheduled Meds:  ? amoxicillin-clavulanate  1 tablet Oral Q12H  ? aspirin EC  81 mg Oral Daily  ? carbidopa-levodopa  1 tablet Oral QID  ? cetaphil   Topical BID  ? insulin aspart  0-9 Units Subcutaneous TID WC  ? lisinopril  10 mg Oral Daily  ? rosuvastatin  20 mg Oral QHS  ? ? ?Continuous Infusions: ? sodium chloride 1 mL (06/08/21 1420)  ? ? ?PRN Meds: ?acetaminophen **OR** acetaminophen, loperamide ? ?Physical Exam ?Vitals and nursing note reviewed.  ?Constitutional:   ?   General: He is not in acute distress. ?    Appearance: He is ill-appearing.  ?Pulmonary:  ?   Effort: No respiratory distress.  ?Skin: ?   General: Skin is warm and dry.  ?Neurological:  ?   Mental Status: He is alert and oriented to person, place, and time.  ?   Motor: Weakness present.  ?Psychiatric:     ?   Attention and Perception: Attention normal.     ?   Behavior: Behavior is cooperative.     ?   Cognition and Memory: Cognition and memory normal.  ?         ? ?Vital Signs: BP 137/78 (  BP Location: Left Arm)   Pulse (!) 55   Temp 97.6 ?F (36.4 ?C) (Oral)   Resp 16   Ht '5\' 8"'$  (1.727 m)   Wt 62.8 kg   SpO2 92%   BMI 21.05 kg/m?  ?SpO2: SpO2: 92 % ?O2 Device: O2 Device: Room Air ?O2 Flow Rate:   ? ?Intake/output summary:  ?Intake/Output Summary (Last 24 hours) at 06/11/2021 1412 ?Last data filed at 06/11/2021 1112 ?Gross per 24 hour  ?Intake 100 ml  ?Output 1900 ml  ?Net -1800 ml  ? ?LBM: Last BM Date : 06/11/21 ?Baseline Weight: Weight: 63.5 kg ?Most recent weight: Weight: 62.8 kg ? ?     ?Palliative Assessment/Data: PPS 40-50% ? ? ? ?Flowsheet Rows   ? ?Flowsheet Row Most Recent Value  ?Intake Tab   ?Referral Department Hospitalist  ?Unit at Time of Referral Intermediate Care Unit  ?Palliative Care Primary Diagnosis Neurology  ?Date Notified 06/10/21  ?Palliative Care Type New Palliative care  ?Reason for referral Clarify Goals of Care  ?Date of Admission 06/07/21  ?Date first seen by Palliative Care 06/10/21  ?# of days Palliative referral response time 0 Day(s)  ?# of days IP prior to Palliative referral 3  ?Clinical Assessment   ?Psychosocial & Spiritual Assessment   ?Palliative Care Outcomes   ?Patient/Family meeting held? Yes  ?Who was at the meeting? patient, brother  ?Palliative Care Outcomes Clarified goals of care, Counseled regarding hospice, Provided psychosocial or spiritual support, Completed durable DNR, Linked to palliative care logitudinal support  ?Patient/Family wishes: Interventions discontinued/not started  Mechanical  Ventilation, BiPAP, Tube feedings/TPN, NIPPV, Hemodialysis, Trach, PEG, Vasopressors, Transfer out of ICU  ? ?  ? ? ?Patient Active Problem List  ? Diagnosis Date Noted  ? BRBPR (bright red blood per rectum) 06/07/2021  ? Infrarenal abdominal aortic aneurysm (AAA) without rupture ([place]) 06/07/2021  ? Pulmonary infiltrate in left lung on CXR 06/07/2021  ? History of COVID-19 03/03/2020  ? Onychomycosis 03/16/2019  ? Presbycusis of both ears 03/16/2019  ? Parkinson's disease ([place]) 01/09/2017  ? Gait disturbance, post-stroke 07/18/2016  ? Coronary artery disease involving native coronary artery of native heart without angina pectoris   ? 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 CVA (cerebrovascular accident due to intracerebral hemorrhage) ([place])   ? Thrombocytopenia ([place])   ? RBBB 06/21/2016  ? CKD (chronic kidney disease), stage III ([place]) 06/21/2016  ? H/O cataract extraction 06/26/2014  ? COLD (chronic obstructive lung disease) ([place]) 02/27/2014  ? Hypertension 06/16/2012  ? 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 Hyperlipidemia 08/14/2008  ? CAD, NATIVE VESSEL 08/14/2008  ? Bradycardia 08/14/2008  ? ? ?Palliative Care Assessment & Plan  ? ?Patient Profile: ?81 y.o. male  with past medical history of Parkinson's disease, cerebrel small vessel disease, CKD III, combined heart failure, HLD and DM 2 in remission presented to ED on 06/07/21 from [facility] with complaints of 2 episodes of rectal bleeding. He was on aspirin and Plavix. CT showed inflammatory colitis and incidental finding of infrarenal abdominal aneurysm and right-sided common iliac artery aneurysm.  Vascular surgery was consulted and will discuss elective repair in outpatient setting.  Patient was admitted 06/07/2021 with bright red blood per rectum, pulmonary infiltrate in right lung on chest x-ray (likely aspiration), infrarenal AAA without rupture.  ? ?Assessment: ?Principal Problem: ?  BRBPR (bright red blood per  rectum) ?Active Problems: ?  Hyperlipidemia ?  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) ?  Hypertension ?  CKD (chronic kidney disease), stage III ([place]) ?  Parkinson's disease ([place]) ?  Infrarenal abdominal aortic aneurysm (AAA) without rupture ([place]) ?  Pulmonary infiltrate in left lung on CXR ? ? ?Recommendations/Plan: ?Continue gentle medical interventions ?Continue DNR/DNI as previously documented ?Discharge back to [facility] with HHPT and outpatient palliative care to follow when medically stable ?Patient open to follow-up outpatient with vascular surgery about AAA -he would be open to invasive procedures if medically recommended ?MOST form finalized as follows: DNR/DNI, limited additional interventions, determine use or limitation of antibiotics when infection occurs, no IV fluids, no feeding tube. Original placed in shadow chart; copy made and will be scanned into Vynca/ACP tab ?PMT will continue to follow peripherally. If there are any imminent needs please call the service directly ? ? ?Goals of Care and Additional Recommendations: ?Limitations on Scope of Treatment: Full Scope Treatment ? ?Code Status: ? ?  ?Code Status Orders  ?(From admission, onward)  ?  ? ? ?  ? ?  Start     Ordered  ? 06/07/21 1849  Do not attempt resuscitation (DNR)  Continuous       ?Question Answer Comment  ?In the event of cardiac or respiratory ARREST Do not call a ?code blue?   ?In the event of cardiac or respiratory ARREST Do not perform Intubation, CPR, defibrillation or ACLS   ?In the event of cardiac or respiratory ARREST Use medication by any route, position, wound care, and other measures to relive pain and suffering. May use oxygen, suction and manual treatment of airway obstruction as needed for comfort.   ?  ? 06/07/21 1848  ? ?  ?  ? ?  ? ?Code Status History   ? ? Date Active Date Inactive Code Status Order ID Comments User Context  ? 03/03/2020 2111 03/15/2020 1924 DNR [id number]  Doran Heater, DO ED  ?  06/24/2016 1816 07/09/2016 1331 Full Code [id number]  Cathlyn Parsons, PA-C Inpatient  ? 06/24/2016 1816 06/24/2016 1816 Full Code [id number]  Cathlyn Parsons, PA-C Inpatient  ? 06/20/2016 2358 06/24/2016 1804 Full Code [id number]  Georgeanna Lea

## 2021-06-12 ENCOUNTER — Telehealth: Payer: Self-pay

## 2021-06-12 NOTE — Telephone Encounter (Signed)
Transition Care Management Follow-up Telephone Call ?Date of discharge and from where: [name] 06-11-21 Dx: bright red blood per rectum  ?How have you been since you were released from the hospital? Doing ok  ?Any questions or concerns? No ? ?Items Reviewed: ?Did the pt receive and understand the discharge instructions provided? Yes  ?Medications obtained and verified? Yes  ?Other? No  ?Any new allergies since your discharge? No  ?Dietary orders reviewed? Yes ?Do you have support at home? Yes  ? ?Home Care and Equipment/Supplies: ?Were home health services ordered? Yes PT ?If so, what is the name of the agency? [facility]   ?Has the agency set up a time to come to the patient's home? no ?Were any new equipment or medical supplies ordered?  No ?What is the name of the medical supply agency? na ?Were you able to get the supplies/equipment? not applicable ?Do you have any questions related to the use of the equipment or supplies? No ? ?Functional Questionnaire: (I = Independent and D = Dependent) ?ADLs: D ? ?Bathing/Dressing- D ? ?Meal Prep- D ? ?Eating- I ? ?Maintaining continence- D ? ?Transferring/Ambulation- D ? ?Managing Meds- D ? ?Follow up appointments reviewed: ? ?PCP Hospital f/u appt confirmed? Yes  Scheduled to see Dr Redmond School on 06-18-21 @ 1130am. ?[facility] f/u appt confirmed? No . ?Are transportation arrangements needed? No  ?If their condition worsens, is the pt aware to call PCP or go to the Emergency Dept.? Yes ?Was the patient provided with contact information for the PCP's office or ED? Yes ?Was to pt encouraged to call back with questions or concerns? Yes  ?

## 2021-06-12 NOTE — Progress Notes (Addendum)
[facility] ([facility]) Hospital Liaison Note  Notified by TOC manager of patient/family request for [facility] palliative services at SNF after discharge.   [facility] hospital liaison will follow patient for discharge disposition.   Please call with any hospice or outpatient palliative care related questions.   Thank you for the opportunity to participate in this patient's care.   Shanita Wicker, LCSW [facility] Hospital Liaison [phone]  

## 2021-06-12 NOTE — Telephone Encounter (Signed)
I called the phone number on file which was to [facility] the facility he is currently staying at. Per his nurse there he will be following up with his doctor there for his hospital f/u visit.  ?

## 2021-06-18 ENCOUNTER — Inpatient Hospital Stay: Payer: Medicare Other | Admitting: Family Medicine

## 2021-06-27 ENCOUNTER — Telehealth: Payer: Self-pay | Admitting: Family Medicine

## 2021-06-27 NOTE — Telephone Encounter (Signed)
No answer unable to leave a message for patient to call back and schedule Medicare Annual Wellness Visit (AWV) in office. ? ? ?If unable to come into the office for AWV,  please offer to do virtually or by telephone. ? ?Last AWV:: 04/30/2020 ? ?Please schedule at anytime with PFM-Nurse Health Advisor. ? ?30 minute appointment for Virtual or phone ?45 minute appointment for in office or Initial virtual/phone ? ?Any questions, please contact me at [phone]   ?

## 2021-07-11 ENCOUNTER — Encounter: Payer: Self-pay | Admitting: Family Medicine

## 2021-07-26 ENCOUNTER — Ambulatory Visit: Payer: Medicare Other | Admitting: Vascular Surgery

## 2021-07-26 ENCOUNTER — Encounter (HOSPITAL_COMMUNITY): Payer: Medicare Other

## 2021-08-01 DEATH — deceased

## 2021-08-15 ENCOUNTER — Telehealth: Payer: Self-pay | Admitting: Family Medicine

## 2021-08-15 NOTE — Telephone Encounter (Signed)
Pt brother Shivan Hodes called and advised that Morio passed on 08/07/22 at [place]. The last 30 days he had a rapid decline.  Was bound to wheelchair, couldn't eat, couldn't swallow. They put Acute Kidney Disease as cause of death per [name].  [name] wanted to make sure you knew.  I will send a sympathy card to family.

## 2022-02-03 ENCOUNTER — Ambulatory Visit: Payer: Medicare Other | Admitting: Family Medicine
